# Patient Record
Sex: Female | Born: 1935 | Race: White | Hispanic: No | Marital: Married | Smoking: Never smoker
Health system: Southern US, Community
[De-identification: ages and names within clinical notes are randomized; demographics above are authoritative.]

## PROBLEM LIST (undated history)

## (undated) DIAGNOSIS — K219 Gastro-esophageal reflux disease without esophagitis: Secondary | ICD-10-CM

## (undated) DIAGNOSIS — Z9889 Other specified postprocedural states: Secondary | ICD-10-CM

## (undated) DIAGNOSIS — I712 Thoracic aortic aneurysm, without rupture, unspecified: Secondary | ICD-10-CM

## (undated) DIAGNOSIS — T4145XA Adverse effect of unspecified anesthetic, initial encounter: Secondary | ICD-10-CM

## (undated) DIAGNOSIS — K2970 Gastritis, unspecified, without bleeding: Secondary | ICD-10-CM

## (undated) DIAGNOSIS — N3020 Other chronic cystitis without hematuria: Secondary | ICD-10-CM

## (undated) DIAGNOSIS — D131 Benign neoplasm of stomach: Secondary | ICD-10-CM

## (undated) DIAGNOSIS — K649 Unspecified hemorrhoids: Secondary | ICD-10-CM

## (undated) DIAGNOSIS — I059 Rheumatic mitral valve disease, unspecified: Secondary | ICD-10-CM

## (undated) DIAGNOSIS — C801 Malignant (primary) neoplasm, unspecified: Secondary | ICD-10-CM

## (undated) DIAGNOSIS — H3530 Unspecified macular degeneration: Secondary | ICD-10-CM

## (undated) DIAGNOSIS — K449 Diaphragmatic hernia without obstruction or gangrene: Secondary | ICD-10-CM

## (undated) DIAGNOSIS — Z860100 Personal history of colon polyps, unspecified: Secondary | ICD-10-CM

## (undated) DIAGNOSIS — J40 Bronchitis, not specified as acute or chronic: Secondary | ICD-10-CM

## (undated) DIAGNOSIS — K5790 Diverticulosis of intestine, part unspecified, without perforation or abscess without bleeding: Secondary | ICD-10-CM

## (undated) DIAGNOSIS — E785 Hyperlipidemia, unspecified: Secondary | ICD-10-CM

## (undated) DIAGNOSIS — D329 Benign neoplasm of meninges, unspecified: Secondary | ICD-10-CM

## (undated) DIAGNOSIS — N200 Calculus of kidney: Secondary | ICD-10-CM

## (undated) DIAGNOSIS — I10 Essential (primary) hypertension: Secondary | ICD-10-CM

## (undated) DIAGNOSIS — M1990 Unspecified osteoarthritis, unspecified site: Secondary | ICD-10-CM

## (undated) DIAGNOSIS — T8859XA Other complications of anesthesia, initial encounter: Secondary | ICD-10-CM

## (undated) DIAGNOSIS — R112 Nausea with vomiting, unspecified: Secondary | ICD-10-CM

## (undated) DIAGNOSIS — M810 Age-related osteoporosis without current pathological fracture: Secondary | ICD-10-CM

## (undated) DIAGNOSIS — F419 Anxiety disorder, unspecified: Secondary | ICD-10-CM

## (undated) DIAGNOSIS — D214 Benign neoplasm of connective and other soft tissue of abdomen: Secondary | ICD-10-CM

## (undated) HISTORY — PX: ABDOMINAL HYSTERECTOMY: SHX81

## (undated) HISTORY — DX: Diaphragmatic hernia without obstruction or gangrene: K44.9

## (undated) HISTORY — DX: Benign neoplasm of stomach: D13.1

## (undated) HISTORY — DX: Calculus of kidney: N20.0

## (undated) HISTORY — DX: Unspecified hemorrhoids: K64.9

## (undated) HISTORY — DX: Gastritis, unspecified, without bleeding: K29.70

## (undated) HISTORY — DX: Hyperlipidemia, unspecified: E78.5

## (undated) HISTORY — PX: APPENDECTOMY: SHX54

## (undated) HISTORY — DX: Essential (primary) hypertension: I10

## (undated) HISTORY — PX: BREAST SURGERY: SHX581

## (undated) HISTORY — DX: Gastro-esophageal reflux disease without esophagitis: K21.9

## (undated) HISTORY — DX: Personal history of colon polyps, unspecified: Z86.0100

## (undated) HISTORY — DX: Thoracic aortic aneurysm, without rupture: I71.2

## (undated) HISTORY — DX: Bronchitis, not specified as acute or chronic: J40

## (undated) HISTORY — PX: TONSILLECTOMY: SUR1361

## (undated) HISTORY — DX: Rheumatic mitral valve disease, unspecified: I05.9

## (undated) HISTORY — DX: Benign neoplasm of meninges, unspecified: D32.9

## (undated) HISTORY — PX: LEG SKIN LESION  BIOPSY / EXCISION: SUR473

## (undated) HISTORY — DX: Diverticulosis of intestine, part unspecified, without perforation or abscess without bleeding: K57.90

## (undated) HISTORY — DX: Benign neoplasm of connective and other soft tissue of abdomen: D21.4

## (undated) HISTORY — DX: Thoracic aortic aneurysm, without rupture, unspecified: I71.20

## (undated) HISTORY — PX: COLONOSCOPY: SHX5424

## (undated) HISTORY — DX: Unspecified macular degeneration: H35.30

## (undated) HISTORY — DX: Other chronic cystitis without hematuria: N30.20

## (undated) HISTORY — DX: Age-related osteoporosis without current pathological fracture: M81.0

## (undated) HISTORY — DX: Unspecified osteoarthritis, unspecified site: M19.90

## (undated) HISTORY — PX: ESOPHAGOGASTRODUODENOSCOPY: SHX1529

## (undated) HISTORY — PX: KNEE SURGERY: SHX244

## (undated) HISTORY — PX: CHOLECYSTECTOMY: SHX55

## (undated) HISTORY — PX: SURGERY OF LIP: SUR1315

## (undated) HISTORY — DX: Personal history of colonic polyps: Z86.010

---

## 1993-09-06 HISTORY — PX: CARDIAC CATHETERIZATION: SHX172

## 1998-09-06 HISTORY — PX: OTHER SURGICAL HISTORY: SHX169

## 2003-04-09 ENCOUNTER — Encounter: Admission: RE | Admit: 2003-04-09 | Discharge: 2003-04-09 | Payer: Self-pay | Admitting: Internal Medicine

## 2003-04-09 ENCOUNTER — Encounter: Payer: Self-pay | Admitting: Internal Medicine

## 2004-02-05 ENCOUNTER — Encounter: Payer: Self-pay | Admitting: Internal Medicine

## 2004-02-10 ENCOUNTER — Encounter: Payer: Self-pay | Admitting: Cardiology

## 2004-07-01 ENCOUNTER — Ambulatory Visit (HOSPITAL_COMMUNITY): Admission: RE | Admit: 2004-07-01 | Discharge: 2004-07-01 | Payer: Self-pay | Admitting: Internal Medicine

## 2005-06-22 ENCOUNTER — Encounter: Payer: Self-pay | Admitting: Cardiology

## 2005-07-06 ENCOUNTER — Ambulatory Visit (HOSPITAL_COMMUNITY): Admission: RE | Admit: 2005-07-06 | Discharge: 2005-07-06 | Payer: Self-pay | Admitting: Internal Medicine

## 2005-11-29 ENCOUNTER — Ambulatory Visit: Payer: Self-pay | Admitting: Internal Medicine

## 2006-07-12 ENCOUNTER — Ambulatory Visit (HOSPITAL_COMMUNITY): Admission: RE | Admit: 2006-07-12 | Discharge: 2006-07-12 | Payer: Self-pay | Admitting: Internal Medicine

## 2006-07-15 ENCOUNTER — Encounter: Admission: RE | Admit: 2006-07-15 | Discharge: 2006-07-15 | Payer: Self-pay | Admitting: Internal Medicine

## 2007-04-04 ENCOUNTER — Ambulatory Visit: Payer: Self-pay | Admitting: Pulmonary Disease

## 2007-04-04 ENCOUNTER — Ambulatory Visit (HOSPITAL_COMMUNITY): Admission: RE | Admit: 2007-04-04 | Discharge: 2007-04-04 | Payer: Self-pay | Admitting: Pulmonary Disease

## 2007-05-04 ENCOUNTER — Ambulatory Visit: Payer: Self-pay | Admitting: Cardiology

## 2007-07-18 ENCOUNTER — Ambulatory Visit (HOSPITAL_COMMUNITY): Admission: RE | Admit: 2007-07-18 | Discharge: 2007-07-18 | Payer: Self-pay | Admitting: Internal Medicine

## 2007-07-21 ENCOUNTER — Ambulatory Visit: Payer: Self-pay | Admitting: Internal Medicine

## 2007-07-26 ENCOUNTER — Ambulatory Visit (HOSPITAL_COMMUNITY): Admission: RE | Admit: 2007-07-26 | Discharge: 2007-07-26 | Payer: Self-pay | Admitting: Internal Medicine

## 2007-08-08 ENCOUNTER — Ambulatory Visit (HOSPITAL_COMMUNITY): Admission: RE | Admit: 2007-08-08 | Discharge: 2007-08-08 | Payer: Self-pay | Admitting: Internal Medicine

## 2007-09-20 ENCOUNTER — Ambulatory Visit: Payer: Self-pay | Admitting: Internal Medicine

## 2007-09-29 ENCOUNTER — Ambulatory Visit: Payer: Self-pay | Admitting: Internal Medicine

## 2007-10-20 DIAGNOSIS — D320 Benign neoplasm of cerebral meninges: Secondary | ICD-10-CM | POA: Insufficient documentation

## 2007-10-20 DIAGNOSIS — Z8679 Personal history of other diseases of the circulatory system: Secondary | ICD-10-CM | POA: Insufficient documentation

## 2007-10-20 DIAGNOSIS — M1990 Unspecified osteoarthritis, unspecified site: Secondary | ICD-10-CM | POA: Insufficient documentation

## 2007-10-20 DIAGNOSIS — J45909 Unspecified asthma, uncomplicated: Secondary | ICD-10-CM

## 2007-10-20 DIAGNOSIS — J309 Allergic rhinitis, unspecified: Secondary | ICD-10-CM | POA: Insufficient documentation

## 2007-10-20 DIAGNOSIS — N200 Calculus of kidney: Secondary | ICD-10-CM | POA: Insufficient documentation

## 2008-04-26 ENCOUNTER — Encounter: Payer: Self-pay | Admitting: Internal Medicine

## 2008-07-19 ENCOUNTER — Ambulatory Visit (HOSPITAL_COMMUNITY): Admission: RE | Admit: 2008-07-19 | Discharge: 2008-07-19 | Payer: Self-pay | Admitting: Family Medicine

## 2009-01-01 ENCOUNTER — Encounter: Admission: RE | Admit: 2009-01-01 | Discharge: 2009-01-01 | Payer: Self-pay | Admitting: Family Medicine

## 2009-01-15 ENCOUNTER — Encounter (INDEPENDENT_AMBULATORY_CARE_PROVIDER_SITE_OTHER): Payer: Self-pay | Admitting: *Deleted

## 2009-01-17 ENCOUNTER — Ambulatory Visit: Payer: Self-pay | Admitting: Internal Medicine

## 2009-01-17 ENCOUNTER — Inpatient Hospital Stay (HOSPITAL_COMMUNITY): Admission: EM | Admit: 2009-01-17 | Discharge: 2009-01-19 | Payer: Self-pay | Admitting: Emergency Medicine

## 2009-01-18 ENCOUNTER — Encounter (INDEPENDENT_AMBULATORY_CARE_PROVIDER_SITE_OTHER): Payer: Self-pay | Admitting: Internal Medicine

## 2009-02-12 ENCOUNTER — Ambulatory Visit: Payer: Self-pay | Admitting: Internal Medicine

## 2009-02-12 DIAGNOSIS — Z860100 Personal history of colon polyps, unspecified: Secondary | ICD-10-CM | POA: Insufficient documentation

## 2009-02-20 ENCOUNTER — Ambulatory Visit: Payer: Self-pay | Admitting: Internal Medicine

## 2009-03-20 ENCOUNTER — Ambulatory Visit: Payer: Self-pay | Admitting: Internal Medicine

## 2009-04-29 ENCOUNTER — Ambulatory Visit: Payer: Self-pay | Admitting: Internal Medicine

## 2009-05-13 ENCOUNTER — Ambulatory Visit: Payer: Self-pay | Admitting: Internal Medicine

## 2009-06-10 ENCOUNTER — Encounter: Payer: Self-pay | Admitting: Internal Medicine

## 2009-06-10 ENCOUNTER — Encounter: Payer: Self-pay | Admitting: Adult Health

## 2009-06-10 LAB — CONVERTED CEMR LAB
ALT: 20 units/L
AST: 24 units/L
Albumin: 4.4 g/dL
Alkaline Phosphatase: 97 units/L
BUN: 17 mg/dL
CO2: 25 meq/L
Calcium: 10 mg/dL
Chloride: 105 meq/L
Cholesterol: 198 mg/dL
Creatinine, Ser: 0.63 mg/dL
Glucose, Bld: 101 mg/dL
HDL: 89 mg/dL
Hemoglobin: 14.3 g/dL
LDL Cholesterol: 89 mg/dL
Platelets: 182 10*3/uL
Potassium: 4.5 meq/L
Sodium: 139 meq/L
TSH: 2.87 microintl units/mL
Total Bilirubin: 0.7 mg/dL
Total Protein: 6.5 g/dL
Triglyceride fasting, serum: 98 mg/dL
WBC: 7.4 10*3/uL

## 2009-06-13 ENCOUNTER — Encounter: Payer: Self-pay | Admitting: Adult Health

## 2009-06-13 ENCOUNTER — Encounter: Admission: RE | Admit: 2009-06-13 | Discharge: 2009-06-13 | Payer: Self-pay | Admitting: Family Medicine

## 2009-07-11 ENCOUNTER — Telehealth: Payer: Self-pay | Admitting: Internal Medicine

## 2009-07-15 ENCOUNTER — Ambulatory Visit: Payer: Self-pay | Admitting: Internal Medicine

## 2009-07-15 DIAGNOSIS — M810 Age-related osteoporosis without current pathological fracture: Secondary | ICD-10-CM | POA: Insufficient documentation

## 2009-07-15 LAB — CONVERTED CEMR LAB
BUN: 19 mg/dL (ref 6–23)
CO2: 30 meq/L (ref 19–32)
Calcium: 9.5 mg/dL (ref 8.4–10.5)
Chloride: 102 meq/L (ref 96–112)
Creatinine, Ser: 0.5 mg/dL (ref 0.4–1.2)
GFR calc non Af Amer: 126.04 mL/min (ref >60)
Glucose, Bld: 89 mg/dL (ref 70–99)
Potassium: 3.9 meq/L (ref 3.5–5.1)
Sodium: 140 meq/L (ref 135–145)

## 2009-07-22 ENCOUNTER — Encounter: Payer: Self-pay | Admitting: Internal Medicine

## 2009-07-25 ENCOUNTER — Ambulatory Visit (HOSPITAL_COMMUNITY): Admission: RE | Admit: 2009-07-25 | Discharge: 2009-07-25 | Payer: Self-pay | Admitting: Family Medicine

## 2009-07-28 ENCOUNTER — Ambulatory Visit (HOSPITAL_COMMUNITY): Admission: RE | Admit: 2009-07-28 | Discharge: 2009-07-28 | Payer: Self-pay | Admitting: Internal Medicine

## 2009-07-28 ENCOUNTER — Encounter: Payer: Self-pay | Admitting: Internal Medicine

## 2009-08-18 ENCOUNTER — Ambulatory Visit: Payer: Self-pay | Admitting: Internal Medicine

## 2009-09-24 ENCOUNTER — Ambulatory Visit: Payer: Self-pay | Admitting: Internal Medicine

## 2009-09-24 DIAGNOSIS — E785 Hyperlipidemia, unspecified: Secondary | ICD-10-CM | POA: Insufficient documentation

## 2009-09-24 DIAGNOSIS — I10 Essential (primary) hypertension: Secondary | ICD-10-CM | POA: Insufficient documentation

## 2009-09-24 DIAGNOSIS — M159 Polyosteoarthritis, unspecified: Secondary | ICD-10-CM

## 2009-09-24 DIAGNOSIS — Z87442 Personal history of urinary calculi: Secondary | ICD-10-CM

## 2009-09-26 ENCOUNTER — Telehealth: Payer: Self-pay | Admitting: Internal Medicine

## 2009-09-29 ENCOUNTER — Encounter (INDEPENDENT_AMBULATORY_CARE_PROVIDER_SITE_OTHER): Payer: Self-pay | Admitting: *Deleted

## 2009-11-17 ENCOUNTER — Ambulatory Visit: Payer: Self-pay | Admitting: Cardiology

## 2009-11-17 DIAGNOSIS — I059 Rheumatic mitral valve disease, unspecified: Secondary | ICD-10-CM

## 2009-11-18 ENCOUNTER — Telehealth: Payer: Self-pay | Admitting: Cardiology

## 2009-12-12 ENCOUNTER — Encounter: Payer: Self-pay | Admitting: Cardiology

## 2009-12-12 ENCOUNTER — Ambulatory Visit: Payer: Self-pay

## 2009-12-12 ENCOUNTER — Ambulatory Visit (HOSPITAL_COMMUNITY): Admission: RE | Admit: 2009-12-12 | Discharge: 2009-12-12 | Payer: Self-pay | Admitting: Cardiology

## 2009-12-12 ENCOUNTER — Ambulatory Visit: Payer: Self-pay | Admitting: Internal Medicine

## 2010-01-23 ENCOUNTER — Ambulatory Visit: Payer: Self-pay | Admitting: Internal Medicine

## 2010-01-23 DIAGNOSIS — L03119 Cellulitis of unspecified part of limb: Secondary | ICD-10-CM

## 2010-01-23 DIAGNOSIS — L02419 Cutaneous abscess of limb, unspecified: Secondary | ICD-10-CM

## 2010-04-16 ENCOUNTER — Telehealth: Payer: Self-pay | Admitting: Internal Medicine

## 2010-06-05 ENCOUNTER — Ambulatory Visit: Payer: Self-pay | Admitting: Internal Medicine

## 2010-06-05 DIAGNOSIS — R5381 Other malaise: Secondary | ICD-10-CM

## 2010-06-05 DIAGNOSIS — R5383 Other fatigue: Secondary | ICD-10-CM

## 2010-06-05 LAB — CONVERTED CEMR LAB
ALT: 17 units/L (ref 0–35)
AST: 21 units/L (ref 0–37)
Albumin: 4 g/dL (ref 3.5–5.2)
Alkaline Phosphatase: 106 units/L (ref 39–117)
Basophils Absolute: 0 10*3/uL (ref 0.0–0.1)
Basophils Relative: 0.2 % (ref 0.0–3.0)
Bilirubin, Direct: 0.2 mg/dL (ref 0.0–0.3)
Cholesterol: 189 mg/dL (ref 0–200)
Eosinophils Absolute: 0.4 10*3/uL (ref 0.0–0.7)
Eosinophils Relative: 4.9 % (ref 0.0–5.0)
HCT: 41 % (ref 36.0–46.0)
HDL: 86.9 mg/dL (ref >39.00)
Hemoglobin: 14.1 g/dL (ref 12.0–15.0)
LDL Cholesterol: 90 mg/dL (ref 0–99)
Lymphocytes Relative: 18 % (ref 12.0–46.0)
Lymphs Abs: 1.3 10*3/uL (ref 0.7–4.0)
MCHC: 34.4 g/dL (ref 30.0–36.0)
MCV: 92.4 fL (ref 78.0–100.0)
Monocytes Absolute: 0.7 10*3/uL (ref 0.1–1.0)
Monocytes Relative: 9.2 % (ref 3.0–12.0)
Neutro Abs: 5.1 10*3/uL (ref 1.4–7.7)
Neutrophils Relative %: 67.7 % (ref 43.0–77.0)
Platelets: 197 10*3/uL (ref 150.0–400.0)
RBC: 4.43 M/uL (ref 3.87–5.11)
RDW: 13.1 % (ref 11.5–14.6)
TSH: 2.47 microintl units/mL (ref 0.35–5.50)
Total Bilirubin: 0.8 mg/dL (ref 0.3–1.2)
Total CHOL/HDL Ratio: 2
Total Protein: 6.6 g/dL (ref 6.0–8.3)
Triglycerides: 60 mg/dL (ref 0.0–149.0)
VLDL: 12 mg/dL (ref 0.0–40.0)
WBC: 7.5 10*3/uL (ref 4.5–10.5)

## 2010-07-28 ENCOUNTER — Ambulatory Visit (HOSPITAL_COMMUNITY)
Admission: RE | Admit: 2010-07-28 | Discharge: 2010-07-28 | Payer: Self-pay | Source: Home / Self Care | Admitting: Internal Medicine

## 2010-10-06 NOTE — Progress Notes (Signed)
Summary: refill question  Medications Added LOSARTAN POTASSIUM 50 MG TABS (LOSARTAN POTASSIUM) 1 by mouth daily       Phone Note From Pharmacy Call back at [phone]   [facility]er: [facility] Summary of Call: pt states Lorsartan is suppose to be 50mg  not 25mg , please call pharmacy to verifiy.... also wants 90 day supply Initial call taken by: Migdalia Dk,  November 18, 2009 9:14 AM  Follow-up for Phone Call        Rx sent in wrong. Resent RX for losartan 50mg  1 by mouth daily. Pt states her b/p is high today but she has not taken her medication this am because she has been out. Told pt she needs to take losartan today and if she continues to have hypertension, she needs to call us back. Will notifiy Evalee Jefferson, CNA  November 18, 2009 10:59 AM  Follow-up by: Marrion Coy, CNA,  November 18, 2009 10:59 AM    New/Updated Medications: LOSARTAN POTASSIUM 50 MG TABS (LOSARTAN POTASSIUM) 1 by mouth daily Prescriptions: LOSARTAN POTASSIUM 50 MG TABS (LOSARTAN POTASSIUM) 1 by mouth daily  #90 x 3   Entered by:   Marrion Coy, CNA   Authorized by:   Rollene Rotunda, MD, [facility]   Signed by:   Marrion Coy, CNA on 11/18/2009   Method used:   [facility]        [facility]  [phone]* (retail)       [address] [place], [place]  [address]       Ph: [phone] or [phone]       Fax: [phone]   RxID:   [id number]   Appended Document: refill question Received and Reviewed

## 2010-10-06 NOTE — Progress Notes (Signed)
Summary: Referral  Phone Note Call from Patient Call back at Home Phone [phone]   Caller: Patient Summary of Call: pt called requesting referral to Cardio. Dr. Rollene Rotunda. pt want appt in Feb but not on a  Mon or Fri. Initial call taken by: Margaret Pyle, CMA,  September 26, 2009 10:52 AM  Follow-up for Phone Call        will make cards referral to [facility] as requested but i can not promise apt will be in feb - - will request to avoid mon and fri - Our office will contact her regarding this appointment once made.  thanks Follow-up by: Newt Lukes MD,  September 26, 2009 12:35 PM  Additional Follow-up for Phone Call Additional follow up Details #1::        pt informed via VM. told to call back with nay further questions or concerns Additional Follow-up by: Margaret Pyle, CMA,  September 26, 2009 1:35 PM

## 2010-10-06 NOTE — Progress Notes (Signed)
Summary: elevated BP  Phone Note Call from Patient Call back at Home Phone [phone]   Caller: Patient Summary of Call: Pt called to inform MD that at her last few OV her BO has been elevated 200/90 the highest. Pt states that she has "white-coat syndrome" and when she checks her BP at home or at her local pharmacy is has been stable at 140/80-70. Pt states that the Urologist that she saw todaywas concerned about her BP and wanted her to inform VAL. Pt has been keeping a BP diary and does notice a pattern of elevated readings at her OV only. Pt has appt in Oct and will bring diary then for MD review. Does pt need to make an earlier appt or okay to monitor and call back as needed? Her BP has since decreased to 139/82 since leaving MDs office. Initial call taken by: Margaret Pyle, CMA,  April 16, 2010 10:18 AM  Follow-up for Phone Call        may keep OV in Oct - sooner if home SBP>140 - thanks Follow-up by: Newt Lukes MD,  April 16, 2010 10:40 AM  Additional Follow-up for Phone Call Additional follow up Details #1::        Pt informed and will monitor home reading  Additional Follow-up by: Margaret Pyle, CMA,  April 16, 2010 11:08 AM

## 2010-10-06 NOTE — Letter (Signed)
Summary: [facility] Consult Scheduled Letter  [facility]  [address] [place], [place] [address]   Phone: [phone]  Fax: [phone]      09/29/2009 MRN: [id number]  SOLEIL MAS [address] [place], [place]  [address]    Dear Ms. Reels,      We have scheduled an appointment for you.  At the recommendation of Dr.leschber, we have scheduled you a consult with Dr.Hochrein on Febuary 24,2011 at 12:00pm.  Their phone number is [phone].If this appointment day and time is not convenient for you, please feel free to call the office of the doctor you are being referred to at the number listed above and reschedule the appointment.  [address] [place] [address]   Thank you,  Patient Care Coordinator [facility]

## 2010-10-06 NOTE — Assessment & Plan Note (Signed)
Summary: NEW/ MEDICARE / BCBS /NWS #   Vital Signs:  Patient profile:   77 year old female Height:      63 inches (160.02 cm) Weight:      134.6 pounds (61.18 kg) O2 Sat:      95 % on Room air Temp:     98.2 degrees F (36.78 degrees C) oral Pulse rate:   80 / minute BP sitting:   134 / 82  (left arm) Cuff size:   regular  Vitals Entered By: Orlan Leavens (September 24, 2009 10:23 AM)  O2 Flow:  Room air CC: New patient Is Patient Diabetic? No Pain Assessment Patient in pain? no        Primary Care Provider:  Newt Lukes MD  CC:  New patient.  History of Present Illness: new pt to me and our PC division - here to est care prev PCP dr. Duanne Guess  1) asthma/reactiv airway dz - follows with our pulm for same - Wert reports compliance with ongoing medical treatment and no changes in medication dose or frequency. denies adverse side effects related to current therapy. no change in breathing with winter air  2) dyslipidemia -  reports compliance with ongoing medical treatment and no changes in medication dose or frequency. denies adverse side effects related to current therapy. no GI or muscle problems reported  3) HTN - reports compliance with ongoing medical treatment and no changes in medication dose or frequency. denies adverse side effects related to current therapy.  variable readings at home with SBP range 80-150s - when changed to generic ARB from Diovan, reports she "lost the diurtetic" and would like it back due to mild occ pedal edema  4) osteoporosis - reports compliance with ongoing medical treatment and no changes in medication dose or frequency. denies adverse side effects related to current therapy. reclast annually as intol to oral bisphos - initiated this Nov 2010 - hx rib fx and pelvic fx  Preventive Screening-Counseling & Management  Alcohol-Tobacco     Smoking Status: never  Safety-Violence-Falls     Fall Risk Counseling: not indicated; no  significant falls noted  Clinical Review Panels:  Prevention   Last Colonoscopy:  Location:  [facility].  Results: Hemorrhoids.     Results: Diverticulosis.        (02/05/2004)  Immunizations   Last Tetanus Booster:  Historical (09/07/2007)   Last Flu Vaccine:  Historical (06/06/2009)   Last Pneumovax:  Historical (09/07/2007)  Lipid Management   Cholesterol:  198 (06/10/2009)   LDL (bad choesterol):  89 (06/10/2009)   HDL (good cholesterol):  89 (06/10/2009)   Triglycerides:  98 (06/10/2009)  CBC   WBC:  7.4 (06/10/2009)   Hgb:  14.3 (06/10/2009)   Platelets:  182 (06/10/2009)  Complete Metabolic Panel   Glucose:  89 (07/15/2009)   Sodium:  140 (07/15/2009)   Potassium:  3.9 (07/15/2009)   Chloride:  102 (07/15/2009)   CO2:  30 (07/15/2009)   BUN:  19 (07/15/2009)   Creatinine:  0.5 (07/15/2009)   Albumin:  4.4 (06/10/2009)   Total Protein:  6.5 (06/10/2009)   Calcium:  9.5 (07/15/2009)   Total Bili:  0.7 (06/10/2009)   Alk Phos:  97 (06/10/2009)   SGPT (ALT):  20 (06/10/2009)   SGOT (AST):  24 (06/10/2009)   -  Date:  06/10/2009    WBC: 7.4    HGB: 14.3    PLT: 182    SGOT (AST): 24  SGPT (ALT): 20    T. Bilirubin: 0.7    Alk Phos: 97    Total Protein: 6.5    Albumin: 4.4    Cholesterol: 198    LDL: 89    HDL: 89    Triglycerides: 98    TSH: 2.870  Current Medications (verified): 1)  Aspirin 81 Mg Tbec (Aspirin) .... Once Daily 2)  Losartan Potassium 50 Mg Tabs (Losartan Potassium) .... Take 1 Tablet By Mouth Once A Day 3)  Nexium 40 Mg Cpdr (Esomeprazole Magnesium) .... Take One 30-60 Min Before First and Last Meals of The Day 4)  Simvastatin 5 Mg Tabs (Simvastatin) .... At Bedtime 5)  Calcium-Vitamin D 500-200 Mg-Unit Tabs (Calcium Carbonate-Vitamin D) .... Take 1 Tablet By Mouth Two Times A Day 6)  Vitamin D3 2000 Unit Caps (Cholecalciferol) .... Take 1 Capsule By Mouth Once A Day 7)  Centrum Silver  Tabs (Multiple  Vitamins-Minerals) .... Take 1 Tablet By Mouth Once A Day 8)  Fluticasone Propionate 50 Mcg/act Susp (Fluticasone Propionate) .Marland Kitchen.. 1-2 Sprays Each Nostril Once Daily 9)  Chlor-Trimeton 4 Mg Tabs (Chlorpheniramine Maleate) .Marland Kitchen.. 1 Every 4 Hours As Needed 10)  Delsym 30 Mg/36ml Lqcr (Dextromethorphan Polistirex) .... 2 Teaspoons Every 12 Hours As Needed 11)  Tessalon 200 Mg Caps (Benzonatate) .... Add 1 Capsule Every 8 Hours As Needed 12)  Nitrofurantoin Macrocrystal 100 Mg Caps (Nitrofurantoin Macrocrystal) .... Take 1 Capsule By Mouth Once A Day 13)  Reclast 5 Mg/1102ml Soln (Zoledronic Acid) .... Infusion Yearly  Allergies (verified): 1)  ! Percocet 2)  ! Codeine 3)  ! * Sodium Penathol 4)  ! Levaquin 5)  ! Sulfa 6)  ! Sulfa 7)  ! Adhesive Tape  Past History:  Past Medical History: hypertension HEART MURMUR, HX OF  RHINOSINUSITIS, ALLERGIC      - [facility] eval 2009 ok  NEPHROLITHIASIS, hx of MENINGIOMA OSTEOARTHRITIS  COUGH onset around 50...........................................................Marland KitchenWert    - EGD c/w es dysmotility 09/29/07    - Gastric emptying study nl 07/26/07    - CT Chest nl 05/04/07     -Initial consult dictated Jan 19, 2009 try off biphosphonate/fish oil > rx ppi two times a day and zantac at hs     - February 20, 2009 add reglan 10 mg ac and hs x 1 month trial Osteoporosis --BMD -3.0 (06/13/09)- (intolerant to bisphosphanate) Reclast rx 07/28/2009 --vitamin d 66 (10/10)  MD rooster: pulm - wert GI- [facility] ent - [facility] - [facility] cards - [facility], but changing to [facility]  Past Surgical History: Cholecystectomy hysterectomy breast surgery Appendectomy knee surgery cataract surgery (2000) Lip surgery, cx (2002)  Family History: Reviewed history from 02/12/2009 and no changes required. Family History of Breast Cancer:Sister Family History of Heart Disease: Mother, Father & Siblings  Social History: Reviewed history from 02/12/2009 and no changes  required. Occupation:Retired Patient has never smoked.  Alcohol Use - yes-wine Daily Caffeine Use Illicit Drug Use - no married, lives with spouse enjoys golf  Review of Systems       see HPI above. I have reviewed all other systems and they were negative.   Physical Exam  General:  alert, well-developed, well-nourished, and cooperative to examination.    Eyes:  vision grossly intact; pupils equal, round and reactive to light.  conjunctiva and lids normal.    Ears:  normal pinnae bilaterally, without erythema, swelling, or tenderness to palpation. TMs clear, without effusion, or cerumen impaction. Hearing grossly normal bilaterally  Mouth:  teeth and gums in good repair; mucous membranes moist, without lesions or ulcers. oropharynx clear without exudate, no erythema.  Lungs:  normal respiratory effort, no intercostal retractions or use of accessory muscles; normal breath sounds bilaterally - no crackles and no wheezes.    Heart:  normal rate, regular rhythm, no murmur, and no rub. BLE without edema. Abdomen:  soft, non-tender, normal bowel sounds, no distention; no masses and no appreciable hepatomegaly or splenomegaly.   Msk:  No deformity or scoliosis noted of thoracic or lumbar spine.   Neurologic:  alert & oriented X3 and cranial nerves II-XII symetrically intact.  strength normal in all extremities, sensation intact to light touch, and gait normal. speech fluent without dysarthria or aphasia; follows commands with good comprehension.  Skin:  no rashes, vesicles, ulcers, or erythema. No nodules or irregularity to palpation.  Psych:  Oriented X3, memory intact for recent and remote, normally interactive, good eye contact, not anxious appearing, not depressed appearing, and not agitated.      Impression & Recommendations:  Problem # 1:  HYPERTENSION (ICD-401.9)  add HCT into ARB for better control - home BP log reviewed Her updated medication list for this problem includes:     Losartan Potassium-hctz 50-12.5 Mg Tabs (Losartan potassium-hctz) .Marland Kitchen... 1 by mouth once daily  BP today: 134/82 Prior BP: 160/90 (08/18/2009)  Labs Reviewed: K+: 3.9 (07/15/2009) Creat: : 0.5 (07/15/2009)   Chol: 198 (06/10/2009)   HDL: 89 (06/10/2009)   LDL: 89 (06/10/2009)   TG: 98 (06/10/2009)  Orders: Prescription Created Electronically [phone])  Problem # 2:  DYSLIPIDEMIA (ICD-272.4) recent FLP and LFTs ok - no adv SE - cont same Her updated medication list for this problem includes:    Simvastatin 5 Mg Tabs (Simvastatin) .Marland Kitchen... At bedtime  Labs Reviewed: SGOT: 24 (06/10/2009)   SGPT: 20 (06/10/2009)   HDL:89 (06/10/2009)  LDL:89 (06/10/2009)  Chol:198 (06/10/2009)  Trig:98 (06/10/2009)  Problem # 3:  OSTEOPOROSIS (ICD-733.00) cont tx and daily Ca+D as ongoing Her updated medication list for this problem includes:    Reclast 5 Mg/157ml Soln (Zoledronic acid) ..... Infusion yearly  Discussed medication use, applications of heat or ice, and exercises.   Problem # 4:  REACTIVE AIRWAY DISEASE (ICD-493.90) chronic cough, intol to MDIs - mgmt as ongoing with pulm  Problem # 5:  HEART MURMUR, HX OF (ICD-V12.50) reports she is planning change to [facility] cards for "new opinion" about the health of her heart - told 2 years ago that her "heart muscles were giving out" but has no symptoms SOB, syncope or edema -  ?last echo - will send for records and refer to new cards (after pt has chosen whom she would like to see - plans to review web site data and d/w her dtr who is Charity fundraiser at [facility] on tele floor - [name])  Problem # 6:  GENERALIZED OSTEOARTHROSIS UNSPECIFIED SITE (ICD-715.00) follows with [facility] [facility] for same as needed -   Time spent with patient 45 minutes, more than 50% of this time was spent reviewing pt health hx, recent labs from former provider and her medications as well as counseling patient on med mgmt of her chronic medical conditions.  Her updated medication list for this problem  includes:    Aspirin 81 Mg Tbec (Aspirin) ..... Once daily  Complete Medication List: 1)  Aspirin 81 Mg Tbec (Aspirin) .... Once daily 2)  Nexium 40 Mg Cpdr (Esomeprazole magnesium) .... Take one 30-60 min before first and last meals  of the day 3)  Simvastatin 5 Mg Tabs (Simvastatin) .... At bedtime 4)  Calcium-vitamin D 500-200 Mg-unit Tabs (Calcium carbonate-vitamin d) .... Take 1 tablet by mouth two times a day 5)  Vitamin D3 2000 Unit Caps (Cholecalciferol) .... Take 1 capsule by mouth once a day 6)  Centrum Silver Tabs (Multiple vitamins-minerals) .... Take 1 tablet by mouth once a day 7)  Fluticasone Propionate 50 Mcg/act Susp (Fluticasone propionate) .Marland Kitchen.. 1-2 sprays each nostril once daily 8)  Chlor-trimeton 4 Mg Tabs (Chlorpheniramine maleate) .Marland Kitchen.. 1 every 4 hours as needed 9)  Delsym 30 Mg/32ml Lqcr (Dextromethorphan polistirex) .... 2 teaspoons every 12 hours as needed 10)  Tessalon 200 Mg Caps (Benzonatate) .... Add 1 capsule every 8 hours as needed 11)  Nitrofurantoin Macrocrystal 100 Mg Caps (Nitrofurantoin macrocrystal) .... Take 1 capsule by mouth once a day 12)  Reclast 5 Mg/154ml Soln (Zoledronic acid) .... Infusion yearly 13)  Losartan Potassium-hctz 50-12.5 Mg Tabs (Losartan potassium-hctz) .Marland Kitchen.. 1 by mouth once daily  Patient Instructions: 1)  it was good to see you today.  2)  change to your blood pressure medication as discussed to include diuretic - your prescription has been electronically submitted to your pharmacy. Please take as directed. Contact our office if you believe you're having problems with the medication(s).  3)  labs from last visit reviewed - looks great! 4)  will send for records from your prior cardiologist to review 5)  Please schedule a follow-up appointment in 4 months, sooner if problems.  6)  Let us know which [facility] cardiology doctor you would like to see and we will make a referral for you Prescriptions: LOSARTAN POTASSIUM-HCTZ 50-12.5 MG TABS  (LOSARTAN POTASSIUM-HCTZ) 1 by mouth once daily  #30 x 11   Entered and Authorized by:   Newt Lukes MD   Signed by:   Newt Lukes MD on 09/24/2009   Method used:   [facility]        [facility]  [phone]* (retail)       [address] [place], [place]  [address]       Ph: [phone] or [phone]       Fax: [phone]   RxID:   [id number]    Immunization History:  Tetanus/Td Immunization History:    Tetanus/Td:  historical (09/07/2007)  Pneumovax Immunization History:    Pneumovax:  historical (09/07/2007)    Colonoscopy  Procedure date:  02/05/2004  Findings:      Location:  [facility].  Results: Hemorrhoids.     Results: Diverticulosis.         EGD  Procedure date:  09/29/2007  Findings:      Location: [facility]  results; numerous proximal gastic polyps (known to be fundic gland polyps), otherwise ok  MISC. Report  Procedure date:  02/05/2004  Findings:      Type of Report: stomach biopsy Results: Benign, neoplasia   EGD  Procedure date:  02/05/2004  Findings:      Location: [facility]  Results: Hiatal hernia               Chronic gastritis

## 2010-10-06 NOTE — Assessment & Plan Note (Signed)
Summary: 4 mth fu--stc   Vital Signs:  Patient profile:   77 year old female Height:      63 inches (160.02 cm) Weight:      135.0 pounds (61.36 kg) O2 Sat:      94 % on Room air Temp:     98.0 degrees F (36.67 degrees C) oral Pulse rate:   66 / minute BP sitting:   142 / 82  (left arm) Cuff size:   regular  Vitals Entered By: Orlan Leavens (Jan 23, 2010 10:00 AM)  O2 Flow:  Room air CC: 4 month follow-up Is Patient Diabetic? No Pain Assessment Patient in pain? no        Primary Care Provider:  Newt Lukes MD  CC:  4 month follow-up.  History of Present Illness: here for followup  1) asthma/reactiv airway dz - follows with our pulm for same - Wert reports compliance with ongoing medical treatment and no changes in medication dose or frequency. denies adverse side effects related to current therapy. no change in breathing with season changes  2) dyslipidemia -  reports compliance with ongoing medical treatment and no changes in medication dose or frequency. denies adverse side effects related to current therapy. no GI or muscle problems reported  3) HTN - reports compliance with ongoing medical treatment and no changes in medication dose or frequency. denies adverse side effects related to current therapy.  variable readings at home with SBP range 110-150s - has seen cards for same -   4) osteoporosis - reports compliance with ongoing medical treatment and no changes in medication dose or frequency. denies adverse side effects related to current therapy. reclast annually as intol to oral bisphos - initiated this Nov 2010 - hx rib fx and pelvic fx  concerned about skin wound on right shin - occured 2 weeks ago - accidental injury caused skin tear - no bleeding but inc redness around scab - no fever, no drainage, no swelling, +tender to touch - no difficulty walking - better with use of polysporin oint daily  Clinical Review Panels:  Lipid Management  Cholesterol:  198 (06/10/2009)   LDL (bad choesterol):  89 (06/10/2009)   HDL (good cholesterol):  89 (06/10/2009)   Triglycerides:  98 (06/10/2009)  CBC   WBC:  7.4 (06/10/2009)   Hgb:  14.3 (06/10/2009)   Platelets:  182 (06/10/2009)  Complete Metabolic Panel   Glucose:  89 (07/15/2009)   Sodium:  140 (07/15/2009)   Potassium:  3.9 (07/15/2009)   Chloride:  102 (07/15/2009)   CO2:  30 (07/15/2009)   BUN:  19 (07/15/2009)   Creatinine:  0.5 (07/15/2009)   Albumin:  4.4 (06/10/2009)   Total Protein:  6.5 (06/10/2009)   Calcium:  9.5 (07/15/2009)   Total Bili:  0.7 (06/10/2009)   Alk Phos:  97 (06/10/2009)   SGPT (ALT):  20 (06/10/2009)   SGOT (AST):  24 (06/10/2009)   Current Medications (verified): 1)  Aspirin 81 Mg Tbec (Aspirin) .... Once Daily 2)  Nexium 40 Mg Cpdr (Esomeprazole Magnesium) .... Take One 30-60 Min Before First and Last Meals of The Day 3)  Simvastatin 5 Mg Tabs (Simvastatin) .... At Bedtime 4)  Calcium-Vitamin D 500-200 Mg-Unit Tabs (Calcium Carbonate-Vitamin D) .... Take 1 Tablet By Mouth Two Times A Day 5)  Vitamin D3 2000 Unit Caps (Cholecalciferol) .... Take 1 Capsule By Mouth Once A Day 6)  Centrum Silver  Tabs (Multiple Vitamins-Minerals) .... Take 1 Tablet By Mouth  Once A Day 7)  Fluticasone Propionate 50 Mcg/act Susp (Fluticasone Propionate) .Marland Kitchen.. 1-2 Sprays Each Nostril Once Daily 8)  Chlor-Trimeton 4 Mg Tabs (Chlorpheniramine Maleate) .Marland Kitchen.. 1 Every 4 Hours As Needed 9)  Delsym 30 Mg/23ml Lqcr (Dextromethorphan Polistirex) .... 2 Teaspoons Every 12 Hours As Needed 10)  Tessalon 200 Mg Caps (Benzonatate) .... Add 1 Capsule Every 8 Hours As Needed 11)  Reclast 5 Mg/159ml Soln (Zoledronic Acid) .... Infusion Yearly 12)  Losartan Potassium 50 Mg Tabs (Losartan Potassium) .Marland Kitchen.. 1 By Mouth Daily 13)  Trimethoprim 100 Mg Tabs (Trimethoprim) .... Take 1 At Bedtime  Allergies (verified): 1)  ! Percocet 2)  ! Codeine 3)  ! * Sodium Penathol 4)  !  Levaquin 5)  ! Sulfa 6)  ! Sulfa 7)  ! Adhesive Tape  Past History:  Past Medical History: Hypertension HEART MURMUR, HX OF   RHINOSINUSITIS, ALLERGIC      - [facility] eval 2009 ok  NEPHROLITHIASIS, hx of MENINGIOMA OSTEOARTHRITIS  COUGH onset around 20...........................................................Marland KitchenWert    - EGD c/w es dysmotility 09/29/07    - Gastric emptying study nl 07/26/07    - CT Chest nl 05/04/07     -Initial consult dictated Jan 19, 2009 try off biphosphonate/fish oil > rx ppi two times a day and zantac at hs     - February 20, 2009 add reglan 10 mg ac and hs x 1 month trial Osteoporosis --BMD -3.0 (06/13/09)- (intolerant to bisphosphanate) Reclast rx 07/28/2009 --vitamin d 66 (10/10)   MD rooster: pulm - wert GI- gessner ent - [facility] - Allusio cards - hochrein  Review of Systems  The patient denies fever, weight loss, syncope, headaches, and abdominal pain.    Physical Exam  General:  alert, well-developed, well-nourished, and cooperative to examination.    Lungs:  normal respiratory effort, no intercostal retractions or use of accessory muscles; normal breath sounds bilaterally - no crackles and no wheezes.    Heart:  normal rate, regular rhythm, no murmur, and no rub. BLE without edema. Skin:  redness 1.5s=cm area surrounding scab on right ant shin at site of skin tear - no drainage or purulence - min inc warmth to this aera -  Psych:  Oriented X3, memory intact for recent and remote, normally interactive, good eye contact, not anxious appearing, not depressed appearing, and not agitated.      Impression & Recommendations:  Problem # 1:  CELLULITIS, LEG, RIGHT (ICD-682.6)  minimal symptoms but tx abx emperically due to inc redness at site of open wound - erx for doxy done cont daily dsg change and wound care - Her updated medication list for this problem includes:    Trimethoprim 100 Mg Tabs (Trimethoprim) .Marland Kitchen... Take 1 at bedtime     Doxycycline Hyclate 100 Mg Caps (Doxycycline hyclate) .Marland Kitchen... 1 by mouth two times a day x 5 days  Elevate affected area. Warm moist compresses for 20 minutes every 2 hours while awake. Take antibiotics as directed and take acetaminophen as needed. To be seen in 48-72 hours if no improvement, sooner if worse.  Orders: Prescription Created Electronically [id number])  Problem # 2:  HYPERTENSION (ICD-401.9)  Her updated medication list for this problem includes:    Losartan Potassium 50 Mg Tabs (Losartan potassium) .Marland Kitchen... 1 by mouth daily  I reviewed her blood pressure diary today. She is predominantly well controlled with occasional elevated readings. She is very symptomatic when blood pressure is low (SBP<110).  stay on the losartan but  keep the blood pressure diary as she has been doing.  BP today: 142/82 Prior BP: 162/84 (11/17/2009)  Labs Reviewed: K+: 3.9 (07/15/2009) Creat: : 0.5 (07/15/2009)   Chol: 198 (06/10/2009)   HDL: 89 (06/10/2009)   LDL: 89 (06/10/2009)   TG: 98 (06/10/2009)  Problem # 3:  DYSLIPIDEMIA (ICD-272.4)  plan check of FLP next OV, to come in fasting - prior values reviewed - cont same Her updated medication list for this problem includes:    Simvastatin 5 Mg Tabs (Simvastatin) .Marland Kitchen... At bedtime  Labs Reviewed: SGOT: 24 (06/10/2009)   SGPT: 20 (06/10/2009)   HDL:89 (06/10/2009)  LDL:89 (06/10/2009)  Chol:198 (06/10/2009)  Trig:98 (06/10/2009)  Complete Medication List: 1)  Aspirin 81 Mg Tbec (Aspirin) .... Once daily 2)  Nexium 40 Mg Cpdr (Esomeprazole magnesium) .... Take one 30-60 min before first and last meals of the day 3)  Simvastatin 5 Mg Tabs (Simvastatin) .... At bedtime 4)  Calcium-vitamin D 500-200 Mg-unit Tabs (Calcium carbonate-vitamin d) .... Take 1 tablet by mouth two times a day 5)  Vitamin D3 2000 Unit Caps (Cholecalciferol) .... Take 1 capsule by mouth once a day 6)  Centrum Silver Tabs (Multiple vitamins-minerals) .... Take 1 tablet by mouth once  a day 7)  Fluticasone Propionate 50 Mcg/act Susp (Fluticasone propionate) .Marland Kitchen.. 1-2 sprays each nostril once daily 8)  Chlor-trimeton 4 Mg Tabs (Chlorpheniramine maleate) .Marland Kitchen.. 1 every 4 hours as needed 9)  Delsym 30 Mg/83ml Lqcr (Dextromethorphan polistirex) .... 2 teaspoons every 12 hours as needed 10)  Tessalon 200 Mg Caps (Benzonatate) .... Add 1 capsule every 8 hours as needed 11)  Reclast 5 Mg/1101ml Soln (Zoledronic acid) .... Infusion yearly 12)  Losartan Potassium 50 Mg Tabs (Losartan potassium) .Marland Kitchen.. 1 by mouth daily 13)  Trimethoprim 100 Mg Tabs (Trimethoprim) .... Take 1 at bedtime 14)  Doxycycline Hyclate 100 Mg Caps (Doxycycline hyclate) .Marland Kitchen.. 1 by mouth two times a day x 5 days  Patient Instructions: 1)  it was good to see you today. 2)  treat the skin wound with 5 days antibiotics - doxycycline - your prescription has been electronically submitted to your pharmacy. Please take as directed. Contact our office if you believe you're having problems with the medication(s).  3)  continue to wash and cover with polysporin as needed until healed - ok to cover the open sore with band-aid 4)  Please schedule a follow-up appointment in 4 months, sooner if problems. come for AM appointment fasting so we can do blood work next visit Prescriptions: DOXYCYCLINE HYCLATE 100 MG CAPS (DOXYCYCLINE HYCLATE) 1 by mouth two times a day x 5 days  #10 x 0   Entered and Authorized by:   Newt Lukes MD   Signed by:   Newt Lukes MD on 01/23/2010   Method used:   Electronically to        [facility]  [phone]* (retail)       [address] [place], [place]  [address]       Ph: [phone] or [phone]       Fax: [phone]   RxID:   [id number]

## 2010-10-06 NOTE — Assessment & Plan Note (Signed)
Summary: 4 MTH FU  STC   Vital Signs:  Patient profile:   77 year old female Height:      63 inches (160.02 cm) Weight:      135.4 pounds (61.55 kg) O2 Sat:      95 % on Room air Temp:     97.7 degrees F (36.50 degrees C) oral Pulse rate:   71 / minute BP sitting:   120 / 76  (left arm) Cuff size:   regular  Vitals Entered By: Orlan Leavens RMA (June 05, 2010 8:27 AM)  O2 Flow:  Room air CC: 4 month follow-up Is Patient Diabetic? No Pain Assessment Patient in pain? no        Primary Care Provider:  Newt Lukes MD  CC:  4 month follow-up.  History of Present Illness: here for followup  1) asthma/reactiv airway dz - follows with our pulm for same - Wert reports compliance with ongoing medical treatment and no changes in medication dose or frequency. denies adverse side effects related to current therapy. no change in breathing with season changes  2) dyslipidemia -  reports compliance with ongoing medical treatment and no changes in medication dose or frequency. denies adverse side effects related to current therapy. no GI or muscle problems reported  3) HTN - reports compliance with ongoing medical treatment and no changes in medication dose or frequency. denies adverse side effects related to current therapy.  variable readings at home with SBP range 110-150s - has seen cards for same -   4) osteoporosis - reports compliance with ongoing medical treatment and no changes in medication dose or frequency. denies adverse side effects related to current therapy. reclast annually begun 07/2009 as intol to oral bisphos -  but took fosamax >15years prior - hx rib fx and pelvic fx 2008 - pt concenred about brittle bone causing fx and wants to hold off on 2nd reclast in 07/2010   Clinical Review Panels:  Immunizations   Last Tetanus Booster:  Historical (09/07/2007)   Last Flu Vaccine:  Historical pt states given @ walkin clinic (05/18/2010)   Last Pneumovax:   Historical (09/07/2007)  Lipid Management   Cholesterol:  198 (06/10/2009)   LDL (bad choesterol):  89 (06/10/2009)   HDL (good cholesterol):  89 (06/10/2009)   Triglycerides:  98 (06/10/2009)  CBC   WBC:  7.4 (06/10/2009)   Hgb:  14.3 (06/10/2009)   Platelets:  182 (06/10/2009)  Complete Metabolic Panel   Glucose:  89 (07/15/2009)   Sodium:  140 (07/15/2009)   Potassium:  3.9 (07/15/2009)   Chloride:  102 (07/15/2009)   CO2:  30 (07/15/2009)   BUN:  19 (07/15/2009)   Creatinine:  0.5 (07/15/2009)   Albumin:  4.4 (06/10/2009)   Total Protein:  6.5 (06/10/2009)   Calcium:  9.5 (07/15/2009)   Total Bili:  0.7 (06/10/2009)   Alk Phos:  97 (06/10/2009)   SGPT (ALT):  20 (06/10/2009)   SGOT (AST):  24 (06/10/2009)   Current Medications (verified): 1)  Aspirin 81 Mg Tbec (Aspirin) .... Once Daily 2)  Nexium 40 Mg Cpdr (Esomeprazole Magnesium) .... Take One 30-60 Min Before First and Last Meals of The Day 3)  Simvastatin 5 Mg Tabs (Simvastatin) .... At Bedtime 4)  Calcium-Vitamin D 500-200 Mg-Unit Tabs (Calcium Carbonate-Vitamin D) .... Take 1 Tablet By Mouth Two Times A Day 5)  Vitamin D3 2000 Unit Caps (Cholecalciferol) .... Take 1 Capsule By Mouth Once A Day 6)  Centrum  Silver  Tabs (Multiple Vitamins-Minerals) .... Take 1 Tablet By Mouth Once A Day 7)  Fluticasone Propionate 50 Mcg/act Susp (Fluticasone Propionate) .Marland Kitchen.. 1-2 Sprays Each Nostril Once Daily 8)  Chlor-Trimeton 4 Mg Tabs (Chlorpheniramine Maleate) .Marland Kitchen.. 1 Every 4 Hours As Needed 9)  Delsym 30 Mg/68ml Lqcr (Dextromethorphan Polistirex) .... 2 Teaspoons Every 12 Hours As Needed 10)  Tessalon 200 Mg Caps (Benzonatate) .... Add 1 Capsule Every 8 Hours As Needed 11)  Reclast 5 Mg/177ml Soln (Zoledronic Acid) .... Infusion Yearly 12)  Losartan Potassium 50 Mg Tabs (Losartan Potassium) .Marland Kitchen.. 1 By Mouth Daily 13)  Trimethoprim 100 Mg Tabs (Trimethoprim) .... Take 1 At Bedtime  Allergies (verified): 1)  ! Percocet 2)  !  Codeine 3)  ! * Sodium Penathol 4)  ! Levaquin 5)  ! Sulfa 6)  ! Sulfa 7)  ! Adhesive Tape  Past History:  Past Medical History: Hypertension HEART MURMUR, HX - mitral regurg, mild (echo 12/2009) RHINOSINUSITIS, ALLERGIC      - [facility] eval 2009 ok  NEPHROLITHIASIS, hx of MENINGIOMA OSTEOARTHRITIS  COUGH onset around 1980...........................................................Marland KitchenWert    - EGD c/w es dysmotility 09/29/07    - Gastric emptying study nl 07/26/07    - CT Chest nl 05/04/07     -Initial consult dictated Jan 19, 2009 try off biphosphonate/fish oil > rx ppi two times a day and zantac at hs     - February 20, 2009 add reglan 10 mg ac and hs x 1 month trial Osteoporosis --BMD -3.0 (06/13/09)- (intolerant to bisphosphanate) Reclast rx 07/28/2009 --vitamin d 66 (10/10)   MD roster: pulm - wert GI- gessner ent - [facility] - Allusio cards - hochrein  Review of Systems  The patient denies fever, weight loss, chest pain, syncope, and headaches.         c/o episodic fatigue and recent hair loss, no bowel  Physical Exam  General:  alert, well-developed, well-nourished, and cooperative to examination.    Lungs:  normal respiratory effort, no intercostal retractions or use of accessory muscles; normal breath sounds bilaterally - no crackles and no wheezes.    Heart:  normal rate, regular rhythm, no murmur, and no rub. BLE without edema.   Impression & Recommendations:  Problem # 1:  OSTEOPOROSIS (ICD-733.00)  Her updated medication list for this problem includes:    Reclast 5 Mg/148ml Soln (Zoledronic acid) ..... Infusion 07/2009 - on hold for now  hx pelvic/rib fx 2008 cont tx and daily Ca+D as ongoing ?brittle bone due to bisphos tx >15y - pt wishes to take drug holiday - asked to consider prolia as needed  Problem # 2:  DYSLIPIDEMIA (ICD-272.4)  Her updated medication list for this problem includes:    Simvastatin 5 Mg Tabs (Simvastatin) .Marland Kitchen... At  bedtime  Orders: TLB-Lipid Panel ([id number])  Labs Reviewed: SGOT: 24 (06/10/2009)   SGPT: 20 (06/10/2009)   HDL:89 (06/10/2009)  LDL:89 (06/10/2009)  Chol:198 (06/10/2009)  Trig:98 (06/10/2009)  Problem # 3:  HYPERTENSION (ICD-401.9)  Her updated medication list for this problem includes:    Losartan Potassium 50 Mg Tabs (Losartan potassium) .Marland Kitchen... 1 by mouth daily  I reviewed her blood pressure diary today. She is predominantly well controlled with occasional elevated readings. She is very symptomatic when blood pressure is low (SBP<110).  stay on the losartan but keep the blood pressure diary as she has been doing.  Prior BP: 162/84 (11/17/2009)  BP today: 120/76 Prior BP: 142/82 (01/23/2010)  Labs Reviewed: K+: 3.9 (  07/15/2009) Creat: : 0.5 (07/15/2009)   Chol: 198 (06/10/2009)   HDL: 89 (06/10/2009)   LDL: 89 (06/10/2009)   TG: 98 (06/10/2009)  Problem # 4:  FATIGUE (ICD-780.79) nonspecific hx and exam - check labs Orders: TLB-CBC Platelet - w/Differential ([id number]-[id number]) TLB-TSH (Thyroid Stimulating Hormone) ([id number])  Complete Medication List: 1)  Aspirin 81 Mg Tbec (Aspirin) .... Once daily 2)  Nexium 40 Mg Cpdr (Esomeprazole magnesium) .... Take one 30-60 min before first and last meals of the day 3)  Simvastatin 5 Mg Tabs (Simvastatin) .... At bedtime 4)  Calcium-vitamin D 500-200 Mg-unit Tabs (Calcium carbonate-vitamin d) .... Take 1 tablet by mouth two times a day 5)  Vitamin D3 2000 Unit Caps (Cholecalciferol) .... Take 1 capsule by mouth once a day 6)  Centrum Silver Tabs (Multiple vitamins-minerals) .... Take 1 tablet by mouth once a day 7)  Fluticasone Propionate 50 Mcg/act Susp (Fluticasone propionate) .Marland Kitchen.. 1-2 sprays each nostril once daily 8)  Chlor-trimeton 4 Mg Tabs (Chlorpheniramine maleate) .Marland Kitchen.. 1 every 4 hours as needed 9)  Delsym 30 Mg/85ml Lqcr (Dextromethorphan polistirex) .... 2 teaspoons every 12 hours as needed 10)  Tessalon 200 Mg Caps  (Benzonatate) .... Add 1 capsule every 8 hours as needed 11)  Reclast 5 Mg/17ml Soln (Zoledronic acid) .... Infusion 07/2009 - on hold for now 12)  Losartan Potassium 50 Mg Tabs (Losartan potassium) .Marland Kitchen.. 1 by mouth daily 13)  Trimethoprim 100 Mg Tabs (Trimethoprim) .... Take 1 at bedtime  Other Orders: TLB-Hepatic/Liver Function Pnl ([id number])  Patient Instructions: 1)  it was good to see you today. 2)  test(s) ordered today - your results will be mailed to you after review in 48-72 hours from the time of test completion; if any changes need to be made or there are abnormal results, you will be contacted directly.  3)  will hold reclast for now - consoider prolia treatment for bones if needed 4)  Please schedule a follow-up appointment in 6 months, sooner if problems. come for AM appointment fasting so we can do blood work next visit   Immunization History:  Influenza Immunization History:    Influenza:  historical pt states given @ walkin clinic (05/18/2010)

## 2010-10-06 NOTE — Assessment & Plan Note (Signed)
Summary: np6/htn  Medications Added LOSARTAN POTASSIUM 25 MG TABS (LOSARTAN POTASSIUM) one daily      Allergies Added:   Visit Type:  Initial Consult Primary Provider:  Newt Lukes MD  CC:  HTN and MVP.  History of Present Illness: The patient presents as a new patient for evaluation of mitral valve prolapse, difficult to control hypertension and a previous suggestion of a cardiomyopathy. She was diagnosed with mitral valve prolapse in [place] years ago. She was seen by a cardiologist here from another group. She had an echocardiogram and stress test in 2006. I don't have these results. She was told she had mitral valve prolapse and that her heart muscle was getting weak. She required no specific therapy for this. She has been followed for management of hypertension which has fluctuated. In fact she's had more lows than hives recently and I reviewed her blood pressure diary. She's had no new cardiovascular complaints but wanted cardiology followup.  She denies any chest pressure, neck or arm discomfort.  She does not feel palpitations though she is noted to have skipped heartbeats frequently on exam. She has not had presyncope or syncope. She does not have shortness of breath, PND or orthopnea. She has had a chronic cough but is having this treated by Dr. Sherene Sires with improvement.  Current Medications (verified): 1)  Aspirin 81 Mg Tbec (Aspirin) .... Once Daily 2)  Nexium 40 Mg Cpdr (Esomeprazole Magnesium) .... Take One 30-60 Min Before First and Last Meals of The Day 3)  Simvastatin 5 Mg Tabs (Simvastatin) .... At Bedtime 4)  Calcium-Vitamin D 500-200 Mg-Unit Tabs (Calcium Carbonate-Vitamin D) .... Take 1 Tablet By Mouth Two Times A Day 5)  Vitamin D3 2000 Unit Caps (Cholecalciferol) .... Take 1 Capsule By Mouth Once A Day 6)  Centrum Silver  Tabs (Multiple Vitamins-Minerals) .... Take 1 Tablet By Mouth Once A Day 7)  Fluticasone Propionate 50 Mcg/act Susp (Fluticasone Propionate) .Marland Kitchen..  1-2 Sprays Each Nostril Once Daily 8)  Chlor-Trimeton 4 Mg Tabs (Chlorpheniramine Maleate) .Marland Kitchen.. 1 Every 4 Hours As Needed 9)  Delsym 30 Mg/34ml Lqcr (Dextromethorphan Polistirex) .... 2 Teaspoons Every 12 Hours As Needed 10)  Tessalon 200 Mg Caps (Benzonatate) .... Add 1 Capsule Every 8 Hours As Needed 11)  Nitrofurantoin Macrocrystal 100 Mg Caps (Nitrofurantoin Macrocrystal) .... Take 1 Capsule By Mouth Once A Day 12)  Reclast 5 Mg/168ml Soln (Zoledronic Acid) .... Infusion Yearly 13)  Losartan Potassium 50 Mg Tabs (Losartan Potassium) .Marland Kitchen.. 1 By Mouth Daily  Allergies (verified): 1)  ! Percocet 2)  ! Codeine 3)  ! * Sodium Penathol 4)  ! Levaquin 5)  ! Sulfa 6)  ! Sulfa 7)  ! Adhesive Tape  Past History:  Past Medical History: Hypertension HEART MURMUR, HX OF  RHINOSINUSITIS, ALLERGIC      - [facility] eval 2009 ok  NEPHROLITHIASIS, hx of MENINGIOMA OSTEOARTHRITIS  COUGH onset around 43...........................................................Marland KitchenWert    - EGD c/w es dysmotility 09/29/07    - Gastric emptying study nl 07/26/07    - CT Chest nl 05/04/07     -Initial consult dictated Jan 19, 2009 try off biphosphonate/fish oil > rx ppi two times a day and zantac at hs     - February 20, 2009 add reglan 10 mg ac and hs x 1 month trial Osteoporosis --BMD -3.0 (06/13/09)- (intolerant to bisphosphanate) Reclast rx 07/28/2009 --vitamin d 66 (10/10)  Past Surgical History: Reviewed history from 09/24/2009 and no changes required. Cholecystectomy hysterectomy breast  surgery Appendectomy knee surgery cataract surgery (2000) Lip surgery, cx (2002)  Family History: Reviewed history from 02/12/2009 and no changes required. Family History of Breast Cancer:Sister Family History of Heart Disease: Mother, Father & Siblings  Social History: Occupation:Retired Patient has never smoked.  Alcohol Use - yes-wine Daily Caffeine Use Illicit Drug Use - no Married, lives with spouse Enjoys  golf  Review of Systems       As stated in the HPI and negative for all other systems.   Vital Signs:  Patient profile:   77 year old female Height:      63 inches Weight:      136 pounds BMI:     24.18 Pulse rate:   68 / minute Resp:     16 per minute BP sitting:   162 / 84  (right arm)  Vitals Entered By: Marrion Coy, CNA (November 17, 2009 11:44 AM)  Physical Exam  General:  Well developed, well nourished, in no acute distress. Head:  normocephalic and atraumatic Eyes:  PERRLA/EOM intact; conjunctiva and lids normal. Mouth:  Oral mucosa normal. Neck:  Neck supple, no JVD. No masses, thyromegaly or abnormal cervical nodes. Chest Wall:  no deformities or breast masses noted Lungs:  Clear bilaterally to auscultation and percussion. Abdomen:  Bowel sounds positive; abdomen soft and non-tender without masses, organomegaly, or hernias noted. No hepatosplenomegaly. Msk:  Back normal, normal gait. Muscle strength and tone normal. Extremities:  No clubbing or cyanosis. Neurologic:  Alert and oriented x 3. Skin:  Intact without lesions or rashes. Cervical Nodes:  no significant adenopathy Inguinal Nodes:  no significant adenopathy Psych:  Normal affect.   Detailed Cardiovascular Exam  Neck    Carotids: Carotids full and equal bilaterally without bruits.      Neck Veins: Normal, no JVD.    Heart    Inspection: no deformities or lifts noted.      Palpation: normal PMI with no thrills palpable.      Auscultation: regular rate and rhythm, S1, S2 without murmurs, rubs, gallops, or clicks.    Vascular    Abdominal Aorta: no palpable masses, pulsations, or audible bruits.      Femoral Pulses: normal femoral pulses bilaterally.      Pedal Pulses: normal pedal pulses bilaterally.      Radial Pulses: normal radial pulses bilaterally.      Peripheral Circulation: no clubbing, cyanosis, or edema noted with normal capillary refill.     EKG  Procedure date:   11/17/2009  Findings:      sinus rhythm, rate 68, axis within normal limits, intervals within normal limits, no acute ST-T wave changes.  Impression & Recommendations:  Problem # 1:  MITRAL VALVE PROLAPSE (ICD-424.0) She has a history of mitral valve prolapse though I did not appreciate this on exam. She also has a vague history of "weak heart muscle" as told to her by another cardiologist. At this point I plan a followup echocardiogram to further investigate this. No change in therapy is planned currently. Orders: EKG w/ Interpretation (93000) Echocardiogram (Echo)  Problem # 2:  DYSLIPIDEMIA (ICD-272.4) I reviewed her most recent lipid profile. Her HDL was 89 and LDL 89. This is an excellent regimen. She will continue the meds as listed. Her updated medication list for this problem includes:    Simvastatin 5 Mg Tabs (Simvastatin) .Marland Kitchen... At bedtime  Problem # 3:  HYPERTENSION (ICD-401.9) She was recently given a prescription for hydrochlorothiazide in addition to the losartan. She  is worried about low blood pressures. I reviewed her blood pressure diary. She is predominantly well controlled with occasional elevated readings but more frequent blood pressures in the 100 or 110 range. She is very symptomatic with these. Therefore, I agree with her reservations about starting hydrochlorothiazide and would suggested she stay on the losartan but keep the blood pressure diary as she has been doing. Orders: EKG w/ Interpretation (93000)  Patient Instructions: 1)  Your physician recommends that you schedule a follow-up appointment in: 12 months with Dr Antoine Poche 2)  Your physician recommends that you continue on your current medications as directed. Please refer to the Current Medication list given to you today. 3)  Your physician has requested that you have an echocardiogram.  Echocardiography is a painless test that uses sound waves to create images of your heart. It provides your doctor with  information about the size and shape of your heart and how well your heart's chambers and valves are working.  This procedure takes approximately one hour. There are no restrictions for this procedure. Prescriptions: LOSARTAN POTASSIUM 25 MG TABS (LOSARTAN POTASSIUM) one daily  #30 x 11   Entered by:   Charolotte Capuchin, RN   Authorized by:   Rollene Rotunda, MD, [facility]   Signed by:   Charolotte Capuchin, RN on 11/17/2009   Method used:   Electronically to        [facility]  [phone]* (retail)       [address] [place], [place]  [address]       Ph: [phone] or [phone]       Fax: [phone]   RxID:   [id number]

## 2010-10-13 ENCOUNTER — Telehealth: Payer: Self-pay | Admitting: Internal Medicine

## 2010-10-14 ENCOUNTER — Telehealth: Payer: Self-pay | Admitting: Internal Medicine

## 2010-10-22 NOTE — Progress Notes (Signed)
Summary: med refills  Phone Note Refill Request Message from:  Fax from Pharmacy on October 13, 2010 10:59 AM  Refills Requested: Medication #1:  SIMVASTATIN 5 MG TABS at bedtime  Medication #2:  NEXIUM 40 MG CPDR Take one 30-60 min before first and last meals of the day [phone]   Method Requested: Electronic Initial call taken by: Orlan Leavens RMA,  October 13, 2010 10:59 AM    Prescriptions: SIMVASTATIN 5 MG TABS (SIMVASTATIN) at bedtime  #30 x 6   Entered by:   Orlan Leavens RMA   Authorized by:   Newt Lukes MD   Signed by:   Orlan Leavens RMA on 10/13/2010   Method used:   Electronically to        [facility]  [phone]* (retail)       [address] [place], [place]  [address]       Ph: [phone] or [phone]       Fax: [phone]   RxID:   [id number] NEXIUM 40 MG CPDR (ESOMEPRAZOLE MAGNESIUM) Take one 30-60 min before first and last meals of the day  #180 x 1   Entered by:   Orlan Leavens RMA   Authorized by:   Newt Lukes MD   Signed by:   Orlan Leavens RMA on 10/13/2010   Method used:   Electronically to        [facility]  [phone]* (retail)       [address] [place], [place]  [address]       Ph: [phone] or [phone]       Fax: [phone]   RxID:   [id number]

## 2010-10-22 NOTE — Progress Notes (Signed)
Summary: 90 simvastatin  Phone Note From Pharmacy   Caller: [facility] [address]  [phone]* Summary of Call: Pt is requesting a 90 day supply on her simvastain 5mg . Is this ok? Initial call taken by: Orlan Leavens RMA,  October 14, 2010 1:20 PM    Prescriptions: SIMVASTATIN 5 MG TABS (SIMVASTATIN) at bedtime  #90 x 1   Entered by:   Orlan Leavens RMA   Authorized by:   Newt Lukes MD   Signed by:   Orlan Leavens RMA on 10/14/2010   Method used:   Electronically to        [facility]  [phone]* (retail)       [address] [place], [place]  [address]       Ph: [phone] or [phone]       Fax: [phone]   RxID:   [id number]

## 2010-11-19 ENCOUNTER — Ambulatory Visit: Payer: Self-pay | Admitting: Cardiology

## 2010-11-25 ENCOUNTER — Encounter: Payer: Self-pay | Admitting: *Deleted

## 2010-12-07 ENCOUNTER — Ambulatory Visit (INDEPENDENT_AMBULATORY_CARE_PROVIDER_SITE_OTHER): Payer: Medicare Other | Admitting: Internal Medicine

## 2010-12-07 ENCOUNTER — Other Ambulatory Visit (INDEPENDENT_AMBULATORY_CARE_PROVIDER_SITE_OTHER): Payer: Medicare Other

## 2010-12-07 ENCOUNTER — Encounter: Payer: Self-pay | Admitting: Internal Medicine

## 2010-12-07 ENCOUNTER — Telehealth: Payer: Self-pay | Admitting: Internal Medicine

## 2010-12-07 DIAGNOSIS — E785 Hyperlipidemia, unspecified: Secondary | ICD-10-CM

## 2010-12-07 DIAGNOSIS — I10 Essential (primary) hypertension: Secondary | ICD-10-CM

## 2010-12-07 DIAGNOSIS — Z79899 Other long term (current) drug therapy: Secondary | ICD-10-CM

## 2010-12-07 DIAGNOSIS — M810 Age-related osteoporosis without current pathological fracture: Secondary | ICD-10-CM

## 2010-12-07 LAB — LIPID PANEL
HDL: 62.5 mg/dL (ref >39.00)
LDL Cholesterol: 74 mg/dL (ref 0–99)
Total CHOL/HDL Ratio: 2
Triglycerides: 71 mg/dL (ref 0.0–149.0)

## 2010-12-07 LAB — HEPATIC FUNCTION PANEL: Albumin: 3.7 g/dL (ref 3.5–5.2)

## 2010-12-07 MED ORDER — FISH OIL 1200 MG PO CAPS: 1.0000 | ORAL_CAPSULE | Freq: Two times a day (BID) | ORAL | Status: DC

## 2010-12-07 MED ORDER — ESOMEPRAZOLE MAGNESIUM 40 MG PO CPDR: 40.0000 mg | DELAYED_RELEASE_CAPSULE | Freq: Two times a day (BID) | ORAL | Status: DC

## 2010-12-07 NOTE — Assessment & Plan Note (Signed)
The current medical regimen is effective;  continue present plan and medications. Check labs now 

## 2010-12-07 NOTE — Telephone Encounter (Signed)
Please call patient - normal results. No medication changes recommended (resumed fish oil at visit). Please also mail copy of labs to patient as per her request. Thanks.   Lab Results  Component Value Date   WBC 7.5 06/05/2010   HGB 14.1 06/05/2010   HCT 41.0 06/05/2010   PLT 197.0 06/05/2010   CHOL 151 12/07/2010   TRIG 71.0 12/07/2010   HDL 62.50 12/07/2010   ALT 23 12/07/2010   AST 21 12/07/2010   NA 140 07/15/2009   K 3.9 07/15/2009   CL 102 07/15/2009   CREATININE 0.5 07/15/2009   BUN 19 07/15/2009   CO2 30 07/15/2009   TSH 2.47 06/05/2010

## 2010-12-07 NOTE — Assessment & Plan Note (Addendum)
Intol of oral bisphos Given reclast 07/2009 but declined 2nd treatment 07/2010 Last dexa 06/13/09 reviewed: -3.0 at L fem

## 2010-12-07 NOTE — Assessment & Plan Note (Signed)
The current medical regimen is effective;  continue present plan and medications. BP Readings from Last 3 Encounters:  12/07/10 140/78  06/05/10 120/76  01/23/10 142/82   Lab Results  Component Value Date   CREATININE 0.5 07/15/2009

## 2010-12-07 NOTE — Telephone Encounter (Signed)
Pt Notified with lab results, also mailed copy yo pt address...12/07/10@4 :13pm/LMB

## 2010-12-07 NOTE — Progress Notes (Signed)
Subjective:    Patient ID: Chelsea Jordan, female    DOB: [birth year], 77 y.o.   MRN: [id number]  HPI here for followup  1) asthma/reactiv airway dz - follows with our pulm for same - Wert reports compliance with ongoing medical treatment and no changes in medication dose or frequency. denies adverse side effects related to current therapy. no change in breathing with season changes  2) dyslipidemia -  reports compliance with ongoing medical treatment and no changes in medication dose or frequency. denies adverse side effects related to current therapy. no GI or muscle problems reported  3) HTN - reports compliance with ongoing medical treatment and no changes in medication dose or frequency. denies adverse side effects related to current therapy.  variable readings at home with SBP range 110-150s - has seen cards for same -   4) osteoporosis - reports compliance with ongoing medical treatment and no changes in medication dose or frequency. denies adverse side effects related to current therapy. reclast annually begun 07/2009 as intol to oral bisphos -  but took fosamax >15years prior - hx rib fx and pelvic fx 2008 - pt concerned about brittle bone causing fx and skipped 2nd reclast in 07/2010  Past Medical History  Diagnosis Date  . DYSLIPIDEMIA 09/24/2009  . HYPERTENSION 09/24/2009  . MITRAL VALVE PROLAPSE 11/17/2009  . NEPHROLITHIASIS 10/20/2007  . GENERALIZED OSTEOARTHROSIS UNSPECIFIED SITE 09/24/2009  . OSTEOARTHRITIS 10/20/2007  . OSTEOPOROSIS 07/15/2009  . HEART MURMUR, HX OF 10/20/2007  . COLONIC POLYPS, HX OF 02/12/2009  . RENAL CALCULUS, HX OF 09/24/2009   Review of Systems  Constitutional: Positive for fatigue. Negative for fever and unexpected weight change.  Respiratory: Negative for cough.   Cardiovascular: Negative for chest pain.  Genitourinary: Negative for dysuria.      Objective:   Physical Exam BP 140/78  Pulse 77  Temp(Src) 97.7 F (36.5 C) (Oral)  Ht 5\' 3"   (1.6 m)  Wt 134 lb 6.4 oz (60.963 kg)  BMI 23.81 kg/m2 Wt Readings from Last 3 Encounters:  12/07/10 134 lb 6.4 oz (60.963 kg)  06/05/10 135 lb 6.4 oz (61.417 kg)  01/23/10 135 lb (61.236 kg)   Physical Exam  Constitutional: She is oriented to person, place, and time. She appears well-developed and well-nourished. No distress.  Eyes: Conjunctivae and EOM are normal. Pupils are equal, round, and reactive to light. No scleral icterus.  Neck: Normal range of motion. Neck supple. No JVD present. No thyromegaly present.  Cardiovascular: Normal rate, regular rhythm and normal heart sounds.  No murmur heard. Pulmonary/Chest: Effort normal and breath sounds normal. No respiratory distress. She has no wheezes.  Neurological: She is alert and oriented to person, place, and time. No cranial nerve deficit. Coordination normal.  Skin: Skin is warm and dry. No rash noted. No erythema.  Psychiatric: She has a normal mood and affect. Her behavior is normal. Judgment and thought content normal.   Lab Results  Component Value Date   WBC 7.5 06/05/2010   HGB 14.1 06/05/2010   HCT 41.0 06/05/2010   PLT 197.0 06/05/2010   CHOL 189 06/05/2010   TRIG 60.0 06/05/2010   HDL 86.90 06/05/2010   ALT 17 06/05/2010   AST 21 06/05/2010   NA 140 07/15/2009   K 3.9 07/15/2009   CL 102 07/15/2009   CREATININE 0.5 07/15/2009   BUN 19 07/15/2009   CO2 30 07/15/2009   TSH 2.47 06/05/2010       Assessment & Plan:  See problem list. Medications and labs reviewed today.

## 2010-12-07 NOTE — Patient Instructions (Signed)
It was good to see you today. Test(s) ordered today. Your results will be mailed to you after review (48-72hours after test completion). If any changes need to be made, you will be notified at that time. Ok to take fish oil as discussed Medications reviewed, no other changes at this time. Refill on medication(s) as discussed today. Please schedule followup in 6 months, call sooner if problems.

## 2010-12-15 LAB — POCT I-STAT, CHEM 8
BUN: 22 mg/dL (ref 6–23)
Calcium, Ion: 1.17 mmol/L (ref 1.12–1.32)
Chloride: 96 mEq/L (ref 96–112)
Creatinine, Ser: 0.9 mg/dL (ref 0.4–1.2)
Glucose, Bld: 107 mg/dL — ABNORMAL HIGH (ref 70–99)
HCT: 47 % — ABNORMAL HIGH (ref 36.0–46.0)
Potassium: 3.3 mEq/L — ABNORMAL LOW (ref 3.5–5.1)

## 2010-12-15 LAB — LIPID PANEL
HDL: 37 mg/dL — ABNORMAL LOW (ref >39)
Total CHOL/HDL Ratio: 3.5 RATIO

## 2010-12-15 LAB — COMPREHENSIVE METABOLIC PANEL
ALT: 25 U/L (ref 0–35)
AST: 25 U/L (ref 0–37)
Alkaline Phosphatase: 42 U/L (ref 39–117)
CO2: 25 mEq/L (ref 19–32)
Calcium: 8.8 mg/dL (ref 8.4–10.5)
Chloride: 109 mEq/L (ref 96–112)
GFR calc Af Amer: >60 mL/min (ref >60)
Potassium: 4.1 mEq/L (ref 3.5–5.1)
Sodium: 137 mEq/L (ref 135–145)
Total Bilirubin: 0.5 mg/dL (ref 0.3–1.2)

## 2010-12-15 LAB — URINALYSIS, ROUTINE W REFLEX MICROSCOPIC
Glucose, UA: NEGATIVE mg/dL
Hgb urine dipstick: NEGATIVE
Protein, ur: 100 mg/dL — AB
Specific Gravity, Urine: 1.023 (ref 1.005–1.030)
Urobilinogen, UA: 2 mg/dL — ABNORMAL HIGH (ref 0.0–1.0)

## 2010-12-15 LAB — CBC
Hemoglobin: 11.7 g/dL — ABNORMAL LOW (ref 12.0–15.0)
Hemoglobin: 14.8 g/dL (ref 12.0–15.0)
MCHC: 34.2 g/dL (ref 30.0–36.0)
MCHC: 33.7 g/dL (ref 30.0–36.0)
MCHC: 34 g/dL (ref 30.0–36.0)
MCV: 91.1 fL (ref 78.0–100.0)
RBC: 3.89 MIL/uL (ref 3.87–5.11)
RBC: 3.81 MIL/uL — ABNORMAL LOW (ref 3.87–5.11)
RBC: 4.78 MIL/uL (ref 3.87–5.11)
RDW: 12 % (ref 11.5–15.5)
WBC: 4.6 10*3/uL (ref 4.0–10.5)
WBC: 10 10*3/uL (ref 4.0–10.5)

## 2010-12-15 LAB — CK TOTAL AND CKMB (NOT AT ARMC): CK, MB: 2.2 ng/mL (ref 0.3–4.0)

## 2010-12-15 LAB — DIFFERENTIAL
Basophils Relative: 0 % (ref 0–1)
Lymphocytes Relative: 4 % — ABNORMAL LOW (ref 12–46)
Lymphs Abs: 0.4 10*3/uL — ABNORMAL LOW (ref 0.7–4.0)
Monocytes Absolute: 0.4 10*3/uL (ref 0.1–1.0)
Monocytes Relative: 4 % (ref 3–12)
Neutro Abs: 9.2 10*3/uL — ABNORMAL HIGH (ref 1.7–7.7)
Neutrophils Relative %: 92 % — ABNORMAL HIGH (ref 43–77)

## 2010-12-15 LAB — MAGNESIUM
Magnesium: 1.8 mg/dL (ref 1.5–2.5)
Magnesium: 1.8 mg/dL (ref 1.5–2.5)

## 2010-12-15 LAB — URINE MICROSCOPIC-ADD ON

## 2010-12-15 LAB — BASIC METABOLIC PANEL
CO2: 22 mEq/L (ref 19–32)
Calcium: 8.2 mg/dL — ABNORMAL LOW (ref 8.4–10.5)
Creatinine, Ser: 0.54 mg/dL (ref 0.4–1.2)
GFR calc Af Amer: >60 mL/min (ref >60)
GFR calc non Af Amer: >60 mL/min (ref >60)
Sodium: 132 mEq/L — ABNORMAL LOW (ref 135–145)

## 2010-12-15 LAB — CARDIAC PANEL(CRET KIN+CKTOT+MB+TROPI)
Relative Index: INVALID (ref 0.0–2.5)
Relative Index: INVALID (ref 0.0–2.5)
Total CK: 66 U/L (ref 7–177)
Troponin I: <0.01 ng/mL (ref 0.00–0.06)

## 2010-12-15 LAB — TROPONIN I: Troponin I: 0.01 ng/mL (ref 0.00–0.06)

## 2010-12-15 LAB — URINE CULTURE
Colony Count: NO GROWTH
Culture: NO GROWTH

## 2011-01-15 ENCOUNTER — Encounter: Payer: Self-pay | Admitting: Cardiology

## 2011-01-15 ENCOUNTER — Ambulatory Visit (INDEPENDENT_AMBULATORY_CARE_PROVIDER_SITE_OTHER): Payer: Medicare Other | Admitting: Cardiology

## 2011-01-15 DIAGNOSIS — I10 Essential (primary) hypertension: Secondary | ICD-10-CM

## 2011-01-15 DIAGNOSIS — I059 Rheumatic mitral valve disease, unspecified: Secondary | ICD-10-CM

## 2011-01-15 DIAGNOSIS — E785 Hyperlipidemia, unspecified: Secondary | ICD-10-CM

## 2011-01-15 NOTE — Progress Notes (Signed)
HPI The patient returns for one year followup. Since I last saw her she has had no new cardiovascular complaints. She has had some fluctuating blood pressures and at one point had a systolic of 85 but she was having a viral infection at that time. For the most part it's in the 120s systolic. It does go she'll get a little fatigued but she can rest and recover from this. She has had episodes where her blood pressure has been 200 systolic and she has been under stress. She has had no chest pressure, neck or arm discomfort. She has had no shortness of breath, PND or orthopnea. She can golf 18 holes without difficulty.  Allergies  Allergen Reactions  . Codeine   . Levofloxacin   . Oxycodone-Acetaminophen   . Sulfonamide Derivatives     REACTION: GI upset/nausea/vomiting    Current Outpatient Prescriptions  Medication Sig Dispense Refill  . Ascorbic Acid (VITAMIN C) 500 MG tablet Take 500 mg by mouth daily.        Marland Kitchen aspirin 81 MG tablet Take 81 mg by mouth daily.        . benzonatate (TESSALON) 200 MG capsule Take 200 mg by mouth every 8 (eight) hours as needed.        . calcium-vitamin D (CALCIUM 500+D) 500-200 MG-UNIT per tablet Take 1 tablet by mouth daily.        . chlorpheniramine (CHLOR-TRIMETON) 4 MG tablet Take 4 mg by mouth every 4 (four) hours as needed.        . Cholecalciferol (VITAMIN D3) 2000 UNITS TABS Take 2,000 Units by mouth daily.        Marland Kitchen esomeprazole (NEXIUM) 40 MG capsule Take 1 capsule (40 mg total) by mouth 2 (two) times daily.  180 capsule  1  . fluticasone (FLONASE) 50 MCG/ACT nasal spray 2 sprays by Nasal route daily.        Marland Kitchen losartan (COZAAR) 50 MG tablet Take 50 mg by mouth daily.        . Lutein 20 MG TABS Take by mouth daily.        . Multiple Vitamins-Minerals (CENTRUM SILVER) tablet Take 1 tablet by mouth daily.        . Omega-3 Fatty Acids (FISH OIL) 1200 MG CAPS Take 1 capsule (1,200 mg total) by mouth 2 (two) times daily.  60 capsule  3  . simvastatin (ZOCOR)  5 MG tablet Take 5 mg by mouth at bedtime.        Marland Kitchen trimethoprim (TRIMPEX) 100 MG tablet Take 100 mg by mouth at bedtime.        . vitamin E 400 UNIT capsule Take 400 Units by mouth daily.        . zoledronic acid (RECLAST) 5 MG/100ML SOLN Inject 5 mg into the vein once. HOLD FOR NOW         Past Medical History  Diagnosis Date  . DYSLIPIDEMIA 09/24/2009  . HYPERTENSION 09/24/2009  . MITRAL VALVE PROLAPSE 11/17/2009  . NEPHROLITHIASIS 10/20/2007  . GENERALIZED OSTEOARTHROSIS UNSPECIFIED SITE 09/24/2009  . OSTEOARTHRITIS 10/20/2007  . OSTEOPOROSIS 07/15/2009  . HEART MURMUR, HX OF 10/20/2007  . COLONIC POLYPS, HX OF 02/12/2009  . RENAL CALCULUS, HX OF 09/24/2009    Past Surgical History  Procedure Date  . Cholecystectomy   . Abdominal hysterectomy   . Appendectomy   . Breast surgery   . Knee surgery   . Cataract surgery 2000  . Surgery of lip in 2002  ROS:  As stated in the HPI and negative for all other systems.  PHYSICAL EXAM BP 158/92  Pulse 77  Resp 18  Ht 5\' 3"  (1.6 m)  Wt 136 lb (61.689 kg)  BMI 24.09 kg/m2  SpO2 93% GENERAL:  Well appearing HEENT:  Pupils equal round and reactive, fundi not visualized, oral mucosa unremarkable NECK:  No jugular venous distention, waveform within normal limits, carotid upstroke brisk and symmetric, no bruits, no thyromegaly LYMPHATICS:  No cervical, inguinal adenopathy LUNGS:  Clear to auscultation bilaterally BACK:  No CVA tenderness CHEST:  Unremarkable HEART:  PMI not displaced or sustained,S1 and S2 within normal limits, no S3, no S4, no clicks, no rubs, soft systolic murmur at the apex. ABD:  Flat, positive bowel sounds normal in frequency in pitch, no bruits, no rebound, no guarding, no midline pulsatile mass, no hepatomegaly, no splenomegaly EXT:  2 plus pulses throughout, no edema, no cyanosis no clubbing SKIN:  No rashes no nodules NEURO:  Cranial nerves II through XII grossly intact, motor grossly intact throughout PSYCH:   Cognitively intact, oriented to person place and time  ASSESSMENT AND PLAN

## 2011-01-15 NOTE — Assessment & Plan Note (Signed)
I review her lipids and they are excellent. I will continue the meds as listed.

## 2011-01-15 NOTE — Assessment & Plan Note (Signed)
Echo last year actually demonstrated some mild mitral regurgitation but no significant prolapse. This will be followed clinically. No further imaging or change in therapy is indicated.

## 2011-01-15 NOTE — Patient Instructions (Signed)
Continue current medications Follow up in 1 year with Dr Hochrein 

## 2011-01-15 NOTE — Assessment & Plan Note (Signed)
Her blood pressures fluctuate. However, for the most part they are controlled and I will continue the meds as listed.

## 2011-01-19 NOTE — Assessment & Plan Note (Signed)
[facility]                         GASTROENTEROLOGY OFFICE NOTE   Chelsea Jordan, Chelsea Jordan                        MRN:          [id number]  DATE:09/20/2007                            DOB:          Apr 24, 1927    CHIEF COMPLAINT:  Follow up of modified barium swallow results.   The modified barium swallow was normal.  The barium pill stopped mid-way  in her esophagus.  When I saw her before, I did not get a sense of  esophageal dysphagia, but now she is telling me there is sort of a mid-  sternal and left pharyngeal sticking point when she swallows solids.  Her cough had gone at night, but when she awakens in the morning, her  husband says she coughs for 15 to 20 minutes.  She has changed from  Actonel monthly to [facility] monthly.  Other medications are listed and  reviewed in the chart as before.  See previous medical history.   EXAMINATION:  VITAL SIGNS:  Weight 146, pulse 72, blood pressure 124/80.   Gastric emptying study was performed, and this was normal.   ASSESSMENT:  Dysphagia.   PLAN:  1. EGD.  Note, she is on diphosphonates.  Maybe those are relatives.  2. Chronic cough.  Plan for an ENT evaluation at some point.  She has      never had one.  She is wondering about the possibility of that.     Iva Boop, MD,FACG  Electronically Signed    CEG/MedQ  DD: 09/23/2007  DT: 09/23/2007  Job #: [id number]   cc:   [name], M.D.

## 2011-01-19 NOTE — Assessment & Plan Note (Signed)
[facility]                             PULMONARY OFFICE NOTE   KYNESHA, GUERIN                        MRN:          [id number]  DATE:04/04/2007                            DOB:          09-29-1926    HISTORY OF PRESENT ILLNESS:  Patient is a pleasant 77 year old female  who I have been asked to see for an abnormal x-ray.  The patient has  been doing very, very well from a pulmonary standpoint except for a low  level cough that she has had for years and considers her baseline.  Patient states approximately 2-1/2 weeks ago the cough began to get more  severe but it continued to be nonproductive.  Patient took Mucinex and  she began to produce pale yellow mucus.  She has also started blowing  purulent mucus from her nares.  Patient was treated with Levaquin 750  one daily for 7 days and feels that she is doing much, much better.  Overall she feels that she is greater than 75% better and is no longer  having any shortness of breath.  The patient did have a chest x-ray done  that showed a right mid lung zone infiltrate but it was unclear whether  this was related to a rib or possibly could be a lung nodule.  Patient  comes in today for further followup.   PAST MEDICAL HISTORY:  1. Hypertension.  2. Dyslipidemia.  3. History of squamous cell carcinoma of the lip.  4. Status post appendectomy and hysterectomy as well as      cholecystectomy.   CURRENT MEDICATIONS:  1. Nexium 40 mg b.i.d.  2. Allegra 180 mg daily.  3. Rhinocort 1 spray daily.  4. Fosamax 70 mg one q.week.  5. Aspirin 81 mg daily.  6. Caltrate daily.  7. Diovan 160/12.5 daily.  8. Vytorin 10/20 nightly.   PATIENT HAS AN INTOLERANCE TO ADHESIVE TAPE AS WELL AS CODEINE AND  PERCOCET.   SOCIAL HISTORY:  She has never smoked.  She is married.  She does not  work outside the home and lives with her husband.   FAMILY HISTORY:  Remarkable for father having had emphysema as well as  heart disease.  Her sister has had breast cancer.   REVIEW OF SYSTEMS:  As per history of present illness, also see patient  intake form documented on the chart.   PHYSICAL EXAMINATION:  GENERAL:  She is a well-developed female in no  acute distress.  Blood pressure is 148/82, pulse 78, temperature is  98.3, weight is 142.6 pounds, O2 saturation on room air is 98%.  HEENT:  Pupils equal, round and reactive to light and accommodation.  Extraocular muscles are intact.  Nares are patent without discharge.  Oropharynx is clear.  There is clearly mucus/liquid material running  down the back of her throat on exam.  CHEST:  Totally clear.  CARDIO:  Reveals regular rate and rhythm, no murmurs, rubs or gallops.  ABDOMEN:  Soft, nontender, good bowel sounds.  Genital, rectal and breast exam was not done and not indicated.  LOWER EXTREMITIES:  With trace edema, pulses are intact distally.  Mild  varicosities are noted.  NEUROLOGIC:  She is alert and oriented with no obvious motor deficits.   IMPRESSION:  1. Probable recent sinobronchitis.  The patient is greatly improved      after a round of antibiotics and is returning back to her usual      status.  She has very little persistent pulmonary symptoms.  2. Abnormal density on the right that appears more consistent with an      old rib fracture with callus formation than a true nodule or mass.      I will do lordotic and lateral rib views to verify this.   PLAN:  1. No change in current management.  2. Check lordotic and lateral rib views.  The patient will follow up      on a p.r.n. basis if her chest x-rays are considered normal and      this abnormal density represents a bone abnormality.     Barbaraann Share, MD,FCCP  Electronically Signed    K[facility]/MedQ  DD: 05/03/2007  DT: 05/04/2007  Job #: [id number]   cc:   Prompt[facility]

## 2011-01-19 NOTE — Consult Note (Signed)
NAME:  Chelsea Jordan, Chelsea Jordan NO.:  [id number]   MEDICAL RECORD NO.:  [id number]          PATIENT TYPE:  INP   LOCATION:  1401                         FACILITY:  [facility]   PHYSICIAN:  Casimiro Needle B. Sherene Sires, MD, FCCPDATE OF BIRTH:  Jan 04, 1927   DATE OF CONSULTATION:  01/18/2009  DATE OF DISCHARGE:  01/19/2009                                 CONSULTATION   REQUESTED BY:  [name], M.D.   CHIEF COMPLAINT:  Cough.   HISTORY:  This is a delightful 77 year old white female who states she  has never smoked but has been coughing for as long as she can remember  but at least 30 years.  The cough has waxed and waned and been more of  a problem during the day than it is while sleeping.  She says that  within a few minutes of stirring around in the morning she begins to  have the cough and then it seems to be worse immediately before she lies  down.  Once she is asleep, the cough goes away.  This pattern has been  present for over 30 years, does not have a seasonal variation, did seem  somewhat better when she was taking Zegerid at bedtime, and for that  reason has undergone an extensive evaluation by [name] including an  upper endoscopy on September 29, 2007 suggesting a dysfunctional  esophagus, and a gastric emptying study performed July 26, 2007  indicating normal emptying.   The history the patient gives is one of coughing so hard that she vomits  but typically does not produce much mucus otherwise.  She denies any  classic itching, sneezing or wheezing or previous response to  decongestants, antihistamines, steroids, or bronchodilators.  She denies  any typical seasonal variation or significant dyspnea except when she is  having severe coughing fits.  She also denies any difficulty swallowing  now, unintended weight loss, active sinus complaints, fevers, chills,  sweats, orthopnea, PND or leg swelling or chest pain of any kind.   PAST MEDICAL HISTORY:  1.  Hypertension, not on ACE inhibitors.  2. Hyperlipidemia.  3. Squamous cell carcinoma of the lip.  4. Status post appendectomy and hysterectomy.  5. Osteoporosis on bisphosphonates.   MEDICATIONS:  Include bisphosphonates and fish oil as well as ARB  therapy and Nexium twice daily.   ALLERGIES:  Intolerance there is ADHESIVE TAPE, CODEINE and PERCOCET.   SOCIAL HISTORY:  She has never smoked.  She is married.  She does not  work outside the home has no unusual occupational history.   FAMILY HISTORY:  Positive for emphysema in her father and heart disease,  her sister has breast cancer.   REVIEW OF SYSTEMS:  Taken in detail and essentially negative except as  outlined above.   PHYSICAL EXAMINATION:  This is a minimally chronically ill-appearing  ambulatory white female in no acute distress.  She is afebrile with  normal vital signs.  HEENT:  Unremarkable.  The pharynx is clear.  Nasal turbinates are  normal.  NECK:  Supple without cervical adenopathy or tenderness.  Trachea  is  midline, no thyromegaly.  There is a regular rate and rhythm without  murmur, gallop or rub.  LUNGS:  Fields are clear bilaterally on auscultation and percussion with  no cough elicited on inspiratory or expiratory maneuvers.  ABDOMEN:  Soft, benign, with normal excursion in the supine position.  EXTREMITIES:  Warm, without calf tenderness, cyanosis, clubbing or  edema.   LABORATORY STUDIES:  Include a chest x-ray showing old rib deformity on  the right posteriorly with minimal COPD changes.  CT of the abdomen and  pelvis is unremarkable.  No CT scan of the chest or sinuses is  available.  She did however have a CT scan of the chest dated May 04, 2007 which I was able to review, indicating no evidence of emphysema,  bronchiectasis or significant adenopathy or nodularity.   IMPRESSION:  Chronic cough dating back over 30 years that is present  during the day and never at night is almost certainly a  form of a  cyclical cough.  The fact that she coughs so hard she vomits is strongly  indicative of reflux disease, acid or otherwise.  This is potentially  worsened by the use of fish oil and bisphosphonates, which I have asked  her to eliminate from her diet in the short run and continue to treat  the acid component of acid reflux aggressively for 4-6 weeks before  returning to the office to take the next step.   I explained to the patient in detail along with her husband the concept  of a 14-step algorithm per present chest guidelines that is intended to  eradicate the cough, with each step needing to be followed to the letter  before taking additional steps.   If she does not respond to aggressive acid suppression, the next step  would be a 6-week trial of Reglan, understanding that there is a risk,  albeit quite small, of adverse effects from short term Reglan therapy.  The other option might be to consider sinus CT scan and methacholine  challenge test as well as a trial of first-generation antihistamine (the  anticholinergic effects of chlorpheniramine appear to work better to  eliminate the postnasal drip syndrome from the differential then Allegra  or Claritin, although she says these have helped somewhat in the past  but have not eradicated the cough completely).      Charlaine Dalton. Sherene Sires, MD, [facility]  Electronically Signed     MBW/MEDQ  D:  01/19/2009  T:  01/20/2009  Job:  [id number]   cc:   [name], M.D.  Fax: [phone]

## 2011-01-19 NOTE — H&P (Signed)
NAME:  Chelsea Jordan, Chelsea Jordan NO.:  [id number]   MEDICAL RECORD NO.:  [id number]          PATIENT TYPE:  INP   LOCATION:                               FACILITY:  [facility]   PHYSICIAN:  Vania Rea, M.D. DATE OF BIRTH:  11-01-1926   DATE OF ADMISSION:  01/17/2009  DATE OF DISCHARGE:  01/19/2009                              HISTORY & PHYSICAL   PRIMARY CARE PHYSICIAN:  [name], M.D.   UROLOGIST:  Maretta Bees. Vonita Moss, M.D., [facility].   PULMONOLOGIST:  Barbaraann Share, MD.   CARDIOLOGIST:  Colleen Can. Deborah Chalk, M.D.   GASTROENTEROLOGIST:  Iva Boop, MD with [facility].   CHIEF COMPLAINT:  Urinary tract infection.   HISTORY OF PRESENT ILLNESS:  This is a 77 year old Caucasian lady with a  history of multiple medical problems including chronic cough, chronic  allergies, allergic rhinitis, bronchitis, GERD, chronic cystitis, who  was seen at her primary physician's office about 2 weeks ago and treated  for acute bronchitis with steroid injections and Amoxil.  Subsequently  says she improved, but has been having exacerbation of her cough,  persistent nausea, episodic vomiting and about 3 days ago developed  dysuria.  The patient had some trimethoprim left over from her  urologist, Dr. Vonita Moss and she started herself on trimethoprim, and  then went into see her primary care physician.  Her primary physician  did urine cultures and started her on oral Septra, but since then her  nausea and vomiting has gotten worse.  Although the urine cultures were  reported as negative, she continues to feel sick.  She was seen in her  doctor's office today and was noted to be hypotensive, and she also  complained of chills.  She was diagnosed with pyelonephritis and advised  to come to [facility] for further evaluation.  The  patient has been in the emergency room since this morning receiving IV  fluids for hypotension.  She has continued to have  persistent cough.  Her blood pressures have gradually risen from 90/51 when initially seen  about 10 hours ago to more recently 185/79.  She has had one elevated  temperature reading while in the emergency room about 100.1.  She  continues to be nauseous.   Her cough is productive occasionally of clear sputum, but is most often  dry.  She denies any active postnasal drip or any facial pain or  tenderness.  She denies any difficulty breathing.  The dysuria has  resolved.  She is having no frequency.  She does feel weak and generally  sick.   PAST MEDICAL HISTORY:  1. Allergic rhinitis.  2. Multiple allergies.  3. Bronchitis, diagnosed April 2010.  4. Sinobronchitis 2008.  5. GERD.  6. Hyperlipidemia.  7. Hypertension.  8. Valvular heart disease, unclear which valve.  9. History of weak heart muscle.  10.__________  11.History of stable meningioma.  12.Osteoporosis.   MEDICATIONS:  1. Actonel 150 mg monthly.  2. Vitamin D 1 tablet 1 capsule daily.  3. Calcium 500 mg twice daily.  4. Aspirin  81 mg daily.  5. Cipro 500 mg every 12 hours.  6. Diovan HCT 160/12.5 daily.  She has been taking this for over 6      years.  7. Allegra 180 mg daily.  8. Fish oil capsules 4 capsules daily.  9. Multivitamins 1 daily.  10.Nexium 49 mg twice daily.  11.Rhinocort Aqua 2 sprays each nostril once or twice daily.  12.Simvastatin 5 mg at bedtime.   ALLERGIES:  1. CODEINE causes nausea and paresthesia.  2. LEVAQUIN causes nausea, diarrhea, rash.  3. PERCOCET causes nausea and vomiting.  4. LIPITOR causes numbness and paresthesias of the legs.  5. LATEX causes a rash.   SOCIAL HISTORY:  There is no history of tobacco, alcohol or illicit drug  use.  She is a retired Landscape architect.  Lives with her husband.   FAMILY HISTORY:  Significant for mother who had myocardial infarction in  her 75's, but lived to be 22.  Brother who had a myocardial infarction  in his 75s, but is still alive.  A twin  sister who recently had a  pacemaker placed, I am unsure for what reason.  Father who was a smoker  and had emphysema.  Review of systems other than noted above a 10-point  review of systems was unremarkable.  She denies hematuria.   PHYSICAL EXAMINATION:  GENERAL:  Elderly, small-built Caucasian lady  lying on the stretcher coughing frequently, but otherwise not  distressed.  VITAL SIGNS:  T-max is 100.1, currently 98.1, pulse is 76, respirations  18, blood pressure 155/79.  She is saturating 99% on 2 liters.  HEENT:  Her pupils are round and equal.  Mucous membranes pink,  anicteric.  She is moderately dehydrated.  NECK:  No cervical adenopathy or thyromegaly.  No jugular venous  distention.  CHEST:  She has marked kyphosis, but her chest is clear to auscultation  without crackles, wheezes or rhonchi.  CARDIOVASCULAR:  Regular rhythm.  No murmur heard.  ABDOMEN:  Soft, nontender, no masses.  EXTREMITIES:  Without edema.  Toes are warm.  Dorsalis pedis pulses were  3+ bilaterally and bounding.  CENTRAL NERVOUS SYSTEM:  Cranial nerves II-XII are grossly intact and  she has no focal neurologic deficit.   LABORATORY DATA:  White count is 10.0, hemoglobin 14.8, MCV 91,  platelets 199, 92% neutrophils and absolute granulocyte count is 9.2.  Differential is otherwise unremarkable.  Her chemistry is significant  for sodium 128, potassium 3.3, chloride 96, BUN 22, creatinine 0.9,  glucose 107.  Urinalysis was cloudy urine with a specific gravity of  1.023, negative for hemoglobin or glucose, 100 mg/dL of proteins, 15  mg/dL of ketones, positive nitrites and trace leukocyte esterase.  Microscopy shows 3-6 white cells, otherwise unremarkable.   DIAGNOSTICS:  1. Two-view chest x-ray showed no acute abnormality.  2. CT scan of abdomen without contrast showed a couple of small renal      calculi bilaterally.  No overt hydronephrosis.  No ureteral      calculi.  No urethral dilation.  In fact,  acute abdominal findings.   ASSESSMENT:  1. Urinary tract infection.  2. Bilateral renal calculi.  3. Possibly acute gastritis related to the Septra use.  4. Dehydration as evidenced by the elevated BUN and creatinine ratio.  5. Hyponatremia and hypokalemia related to dehydration  6. Chronic cough possibly related to ARB/ACE inhibitor use.  7. History of chronic cystitis.  8. History of gastroesophageal reflux disease, multiple managers.  9. History  of osteoporosis.  10.History of alkaline phosphatase.   PLAN:  We will bring this lady in for hydration and repletion of her  electrolytes.  We will  continue to treat her with intravenous Rocephin.  We will give antiemetics and we will re-culture her urine since it is  showing evidence of  infection.  We will discontinue ARBs and monitor her for resolution of  the cough, but we will continue her antihistamines.  We will get a 2-D  echo since we have no echo recorded in our system and physical exam does  not reveal any evidence of valvular heart disease.  Other plans as per  orders.      Vania Rea, M.D.  Electronically Signed     LC/MEDQ  D:  01/17/2009  T:  01/18/2009  Job:  [id number]   cc:   Iva Boop, MD,FACG  [facility]  [address] [place], [place] [address]   Colleen Can. Deborah Chalk, M.D.  Fax: [phone]   Barbaraann Share, MD,FCCP  [address]  [place]  [place] [address]   [name], M.D.  Fax: [phone]   Maretta Bees. Vonita Moss, M.D.  Fax: [phone]

## 2011-01-19 NOTE — Assessment & Plan Note (Signed)
[facility]                         GASTROENTEROLOGY OFFICE NOTE   CLARABELLE, OSCARSON                        MRN:          [id number]  DATE:07/21/2007                            DOB:          03/12/1927    CHIEF COMPLAINT:  Reflux refills.   Ms. Desaulniers continues to have a cough.  She is on Nexium 40 mg b.i.d.  She recently had some recurrent sinobronchitis improved with antibiotics  over the summer.  She had been better, I thought, in March 2007.  It is  really hard to sort out how badly she is doing at this time but she is  sitting here coughing quite a bit.   MEDICATIONS:  Listed and reviewed in the chart.   VITAL SIGNS:  Height 5 feet 4 inches, weight 142 pounds, pulse 78, blood  pressure 136/76.   Note, she says when she swallows sometimes food goes down the wrong way  with the first bite.   ASSESSMENT:  Gastroesophageal reflux disease is known.  She does not  have significant heartburn at this time.  She may have a little bit of  aspiration.  There is no esophageal dysphagia.   PLAN:  I am going to work her up further with a gastric-emptying study  and possible upper GI series or modified barium swallow.  We now have  the option of Bravo pH testing which may be useful.  This is a difficult  problem that is likely multifactorial.  I do wonder if she is having  tachyphylaxis to Nexium and that is something to consider.  Further  plans pending these results.     Iva Boop, MD,FACG  Electronically Signed    CEG/MedQ  DD: 07/21/2007  DT: 07/21/2007  Job #: [id number]

## 2011-04-08 ENCOUNTER — Encounter: Payer: Self-pay | Admitting: Internal Medicine

## 2011-04-14 ENCOUNTER — Other Ambulatory Visit: Payer: Self-pay | Admitting: *Deleted

## 2011-04-14 MED ORDER — LOSARTAN POTASSIUM 50 MG PO TABS: 50.0000 mg | ORAL_TABLET | Freq: Every day | ORAL | Status: DC

## 2011-04-17 ENCOUNTER — Other Ambulatory Visit: Payer: Self-pay | Admitting: Internal Medicine

## 2011-04-19 ENCOUNTER — Telehealth: Payer: Self-pay | Admitting: Cardiology

## 2011-04-19 NOTE — Telephone Encounter (Signed)
PT NEEDS LOSARTIN TO BE CALL IN TO [facility]/# [phone]

## 2011-04-20 MED ORDER — LOSARTAN POTASSIUM 50 MG PO TABS: 50.0000 mg | ORAL_TABLET | Freq: Every day | ORAL | Status: DC

## 2011-04-21 ENCOUNTER — Telehealth: Payer: Self-pay | Admitting: Cardiology

## 2011-04-21 MED ORDER — LOSARTAN POTASSIUM 50 MG PO TABS: 50.0000 mg | ORAL_TABLET | Freq: Every day | ORAL | Status: DC

## 2011-04-21 NOTE — Telephone Encounter (Signed)
Pharmacist needs the okay to increase pt losartan to 90 days supply.

## 2011-05-10 ENCOUNTER — Other Ambulatory Visit: Payer: Self-pay | Admitting: Internal Medicine

## 2011-06-07 ENCOUNTER — Other Ambulatory Visit (INDEPENDENT_AMBULATORY_CARE_PROVIDER_SITE_OTHER): Payer: Medicare Other

## 2011-06-07 ENCOUNTER — Encounter: Payer: Self-pay | Admitting: Internal Medicine

## 2011-06-07 ENCOUNTER — Ambulatory Visit (INDEPENDENT_AMBULATORY_CARE_PROVIDER_SITE_OTHER): Payer: Medicare Other | Admitting: Internal Medicine

## 2011-06-07 VITALS — BP 150/82 | HR 66 | Temp 97.9°F | Ht 62.0 in | Wt 132.4 lb

## 2011-06-07 DIAGNOSIS — E785 Hyperlipidemia, unspecified: Secondary | ICD-10-CM

## 2011-06-07 DIAGNOSIS — Z23 Encounter for immunization: Secondary | ICD-10-CM

## 2011-06-07 DIAGNOSIS — M810 Age-related osteoporosis without current pathological fracture: Secondary | ICD-10-CM

## 2011-06-07 DIAGNOSIS — I10 Essential (primary) hypertension: Secondary | ICD-10-CM

## 2011-06-07 DIAGNOSIS — Z0000 Encounter for general adult medical examination without abnormal findings: Secondary | ICD-10-CM

## 2011-06-07 DIAGNOSIS — Z136 Encounter for screening for cardiovascular disorders: Secondary | ICD-10-CM

## 2011-06-07 LAB — BASIC METABOLIC PANEL
Chloride: 104 mEq/L (ref 96–112)
Potassium: 4.1 mEq/L (ref 3.5–5.1)
Sodium: 140 mEq/L (ref 135–145)

## 2011-06-07 LAB — PHOSPHORUS: Phosphorus: 3.6 mg/dL (ref 2.3–4.6)

## 2011-06-07 LAB — LIPID PANEL
LDL Cholesterol: 96 mg/dL (ref 0–99)
Total CHOL/HDL Ratio: 2

## 2011-06-07 NOTE — Assessment & Plan Note (Signed)
Intol of oral bisphos - prev on fosamax for >15 years -  Given reclast 07/2009 but declined 2nd treatment 07/2010 Last dexa 06/13/09 reviewed: -3.0 at L fem hx rib fx and pelvic fx 2008 - pt concerned about brittle bone causing fx and skipped 2nd reclast in 07/2010 - will sched f/u dexa after 06/14/11

## 2011-06-07 NOTE — Progress Notes (Signed)
Subjective:    Patient ID: Chelsea Jordan, female    DOB: 10-Jun-1927, 77 y.o.   MRN: [id number]  HPI  Here for medicare wellness  Diet: heart healthy Physical activity: sedentary Depression/mood screen: negative Hearing: intact to whispered voice Visual acuity: grossly normal but known macular degeneration, performs annual eye exam  ADLs: capable Fall risk: none Home safety: good Cognitive evaluation: intact to orientation, naming, recall and repetition EOL planning: adv directives, full code/ I agree  I have personally reviewed and have noted 1. The patient's medical and social history 2. Their use of alcohol, tobacco or illicit drugs 3. Their current medications and supplements 4. The patient's functional ability including ADL's, fall risks, home safety risks and hearing or visual impairment. 5. Diet and physical activities 6. Evidence for depression or mood disorders   Also reviewed chronic medical issues:  asthma/reactiv airway dz - follows with our pulm for same - Wert reports compliance with ongoing medical treatment and no changes in medication dose or frequency. denies adverse side effects related to current therapy. no change in breathing with season changes  dyslipidemia - reports compliance with ongoing medical treatment and no changes in medication dose or frequency. denies adverse side effects related to current therapy. no GI or muscle problems reported  HTN - reports compliance with ongoing medical treatment and no changes in medication dose or frequency. denies adverse side effects related to current therapy.  variable readings at home with SBP range 110-150s - has seen cards for same -   osteoporosis - reports compliance with ongoing medical treatment and no changes in medication dose or frequency. denies adverse side effects related to current therapy. reclast annually begun 07/2009 as intol to oral bisphos -  but took fosamax >15years prior - hx rib fx and  pelvic fx 2008 - pt concerned about brittle bone causing fx and skipped 2nd reclast in 07/2010 - will sched f/u dexa after 06/14/11  Past Medical History  Diagnosis Date  . DYSLIPIDEMIA   . HYPERTENSION   . MITRAL VALVE PROLAPSE   . NEPHROLITHIASIS   . OSTEOARTHRITIS   . OSTEOPOROSIS   . COLONIC POLYPS, HX OF    Family History  Problem Relation Age of Onset  . Heart disease Mother   . Heart disease Father   . Breast cancer Sister   . Heart disease Other    History  Substance Use Topics  . Smoking status: Never Smoker   . Smokeless tobacco: Not on file   Comment: Married, lives with spouse, Enjoys golf. Retired  . Alcohol Use: Yes     Wine     Review of Systems  Constitutional: Positive for fatigue. Negative for fever and unexpected weight change.  Respiratory: Negative for cough.   Cardiovascular: Negative for chest pain.  Genitourinary: Negative for dysuria.  No other specific complaints in a complete review of systems (except as listed in HPI above).     Objective:   Physical Exam  BP 150/82  Pulse 66  Temp(Src) 97.9 F (36.6 C) (Oral)  Ht 5\' 2"  (1.575 m)  Wt 132 lb 6.4 oz (60.056 kg)  BMI 24.22 kg/m2  SpO2 97% Wt Readings from Last 3 Encounters:  06/07/11 132 lb 6.4 oz (60.056 kg)  01/15/11 136 lb (61.689 kg)  12/07/10 134 lb 6.4 oz (60.963 kg)   Constitutional: She is well-developed and well-nourished. No distress.  Eyes: Conjunctivae and EOM are normal. Pupils are equal, round, and reactive to light. No scleral icterus.  Neck: Normal range of motion. Neck supple. No JVD present. No thyromegaly present.  Cardiovascular: Normal rate, regular rhythm and normal heart sounds.  No murmur heard. no BLE edema Pulmonary/Chest: Effort normal and breath sounds normal. No respiratory distress. She has no wheezes.  Abd: SNTND, +BS, no masses Neurological: She is alert and oriented to person, place, and time. No cranial nerve deficit. Coordination normal.  Skin: Skin  is warm and dry. No rash noted. No erythema.  Psychiatric: She has a normal mood and affect. Her behavior is normal. Judgment and thought content normal.   Lab Results  Component Value Date   WBC 7.5 06/05/2010   HGB 14.1 06/05/2010   HCT 41.0 06/05/2010   PLT 197.0 06/05/2010   CHOL 151 12/07/2010   TRIG 71.0 12/07/2010   HDL 62.50 12/07/2010   ALT 23 12/07/2010   AST 21 12/07/2010   NA 140 07/15/2009   K 3.9 07/15/2009   CL 102 07/15/2009   CREATININE 0.5 07/15/2009   BUN 19 07/15/2009   CO2 30 07/15/2009   TSH 2.47 06/05/2010   EKG: NSR @63bpm  - no sp ST-T changes, no change from 11/2009     Assessment & Plan:  AWV - v70.0 - Today patient counseled on age appropriate routine health concerns for screening and prevention, each reviewed and up to date or declined. Immunizations reviewed and up to date or declined. Labs/ECG reviewed. Risk factors for depression reviewed and negative. Hearing function and visual acuity are intact. ADLs screened and addressed as needed. Functional ability and level of safety reviewed and appropriate. Education, counseling and referrals performed based on assessed risks today. Patient provided with a copy of personalized plan for preventive services.  Also see problem list. Medications and labs reviewed today.

## 2011-06-07 NOTE — Assessment & Plan Note (Signed)
The current medical regimen is usually effective (component of known white coat HTN);  continue present plan and medications.  BP Readings from Last 3 Encounters:  06/07/11 150/82  01/15/11 158/92  12/07/10 140/78   Lab Results  Component Value Date   CREATININE 0.5 07/15/2009

## 2011-06-07 NOTE — Patient Instructions (Signed)
It was good to see you today. Test(s) ordered today. Your results will be called to you and mailed to you after review (48-72hours after test completion). If any changes need to be made, you will be notified at that time. We have reviewed your prior records including labs and tests today Medications reviewed, no changes at this time. Will order repeat bone density scan after next week - will call you to notify you of test Please schedule followup in 6 months, call sooner if problems.

## 2011-06-07 NOTE — Assessment & Plan Note (Signed)
The current medical regimen is effective (statin + fish oil);  continue present plan and medications.  Check labs now

## 2011-06-14 ENCOUNTER — Other Ambulatory Visit: Payer: Self-pay | Admitting: Internal Medicine

## 2011-06-14 DIAGNOSIS — M810 Age-related osteoporosis without current pathological fracture: Secondary | ICD-10-CM

## 2011-06-21 ENCOUNTER — Other Ambulatory Visit: Payer: Medicare Other

## 2011-06-22 ENCOUNTER — Ambulatory Visit (INDEPENDENT_AMBULATORY_CARE_PROVIDER_SITE_OTHER)
Admission: RE | Admit: 2011-06-22 | Discharge: 2011-06-22 | Disposition: A | Discharge status: Home / Self Care | Payer: Medicare Other | Source: Ambulatory Visit | Attending: Internal Medicine | Admitting: Internal Medicine

## 2011-06-22 DIAGNOSIS — M810 Age-related osteoporosis without current pathological fracture: Secondary | ICD-10-CM

## 2011-07-01 ENCOUNTER — Encounter: Payer: Self-pay | Admitting: Internal Medicine

## 2011-07-13 ENCOUNTER — Other Ambulatory Visit: Payer: Self-pay | Admitting: Internal Medicine

## 2011-07-13 DIAGNOSIS — Z1231 Encounter for screening mammogram for malignant neoplasm of breast: Secondary | ICD-10-CM

## 2011-07-20 ENCOUNTER — Telehealth: Payer: Self-pay | Admitting: *Deleted

## 2011-07-20 NOTE — Telephone Encounter (Signed)
Hospital short stay is now also on Epic/EMR, so the process to order reclast treatment has changed.   Let me look into this process and we will get back to patient on same. Thanks.

## 2011-07-20 NOTE — Telephone Encounter (Signed)
Pt left msg on vm md wanted her to have reclast infusion done husband is now out of the hospital. Can set up infusion. Can't do on Dec 5th...07/20/11@11 :22am/LMB

## 2011-07-20 NOTE — Telephone Encounter (Signed)
Notified pt with md response...07/20/11@3 :56pm/LMB

## 2011-07-21 ENCOUNTER — Other Ambulatory Visit: Payer: Self-pay | Admitting: Internal Medicine

## 2011-07-22 ENCOUNTER — Telehealth: Payer: Self-pay | Admitting: *Deleted

## 2011-07-22 ENCOUNTER — Other Ambulatory Visit (HOSPITAL_COMMUNITY): Payer: Self-pay | Admitting: *Deleted

## 2011-07-22 DIAGNOSIS — I10 Essential (primary) hypertension: Secondary | ICD-10-CM

## 2011-07-22 DIAGNOSIS — M810 Age-related osteoporosis without current pathological fracture: Secondary | ICD-10-CM

## 2011-07-22 DIAGNOSIS — Z79899 Other long term (current) drug therapy: Secondary | ICD-10-CM

## 2011-07-22 NOTE — Telephone Encounter (Signed)
Ok to order Bmet, v58.69 or 733.01 or 401.9 on day of Reclast infusion - thanks

## 2011-07-22 NOTE — Telephone Encounter (Signed)
Received order for infusion. Labs are out of date, must be done 30 days prior to getting infusion. Can draw the same day, but will need order....07/22/11@ 2:07pm/LMB

## 2011-07-22 NOTE — Telephone Encounter (Signed)
Notified short stay spoke with [name] entered Bmet in epic but she is not able to see order. [name] is faxing original order back will add bmet on order and fax back to short stay. Pt will have lab done prior to infusion....07/21/1222:18pm/LMB

## 2011-07-26 ENCOUNTER — Encounter: Payer: Self-pay | Admitting: Internal Medicine

## 2011-07-26 ENCOUNTER — Ambulatory Visit (INDEPENDENT_AMBULATORY_CARE_PROVIDER_SITE_OTHER): Payer: Medicare Other | Admitting: Internal Medicine

## 2011-07-26 VITALS — BP 130/80 | HR 73 | Temp 98.0°F

## 2011-07-26 DIAGNOSIS — M810 Age-related osteoporosis without current pathological fracture: Secondary | ICD-10-CM

## 2011-07-26 DIAGNOSIS — H6120 Impacted cerumen, unspecified ear: Secondary | ICD-10-CM

## 2011-07-26 DIAGNOSIS — M26629 Arthralgia of temporomandibular joint, unspecified side: Secondary | ICD-10-CM

## 2011-07-26 NOTE — Patient Instructions (Signed)
It was good to see you today. Suspect your pain is coming from [facility] arthritis flare> use Tylenol arthritis twice a day for the next week to help with the symptoms If continued jaw pain symptoms, followup with your dentist as discussed Will hold on request until jaw symptoms have resolved, reconsider spring 2013 Your ear has been irrigated of wax today

## 2011-07-26 NOTE — Progress Notes (Signed)
  Subjective:    Patient ID: Chelsea Jordan, female    DOB: 12/04/1926, 77 y.o.   MRN: [id number]  HPI complains of jaw pain, R side Onset 2 weeks ago +clenching teeth at night (stress with spouse illness) Also R ear ache - ?wax - no swelling of ear  Advised by short stay for eval prior to Reclast infusion (scheduled dose on hold)   Past Medical History  Diagnosis Date  . DYSLIPIDEMIA   . HYPERTENSION   . MITRAL VALVE PROLAPSE   . NEPHROLITHIASIS   . OSTEOARTHRITIS   . OSTEOPOROSIS   . COLONIC POLYPS, HX OF     Review of Systems  Constitutional: Negative for fever and fatigue.  HENT: Negative for ear pain, nosebleeds, facial swelling and neck pain.   Respiratory: Negative for cough, shortness of breath and wheezing.   Cardiovascular: Negative for chest pain and palpitations.       Objective:   Physical Exam BP 130/80  Pulse 73  Temp(Src) 98 F (36.7 C) (Oral)  SpO2 98% Wt Readings from Last 3 Encounters:  06/07/11 132 lb 6.4 oz (60.056 kg)  01/15/11 136 lb (61.689 kg)  12/07/10 134 lb 6.4 oz (60.963 kg)   Constitutional: She appears well-developed and well-nourished. No distress.  HENT: Head: Normocephalic and atraumatic. Ears: L TM ok, no erythema or effusion; R TM occluded by cerumen; Nose: Nose normal. Mouth/Throat: +TMJ click with open/close - teeth intact without gum problems. Oropharynx is clear and moist. No oropharyngeal exudate. Nontender to palpation of jaw Eyes: Conjunctivae and EOM are normal. Pupils are equal, round, and reactive to light. No scleral icterus.  Neck: Normal range of motion. Neck supple. No JVD present. No thyromegaly present.  Cardiovascular: Normal rate, regular rhythm and normal heart sounds.  No murmur heard. No BLE edema. Pulmonary/Chest: Effort normal and breath sounds normal. No respiratory distress. She has no wheezes.Skin: Skin is warm and dry. No rash noted. No erythema.  Psychiatric: She has a normal mood and affect. Her behavior is  normal. Judgment and thought content normal.   Lab Results  Component Value Date   WBC 7.5 06/05/2010   HGB 14.1 06/05/2010   HCT 41.0 06/05/2010   PLT 197.0 06/05/2010   GLUCOSE 103* 06/07/2011   CHOL 199 06/07/2011   TRIG 55.0 06/07/2011   HDL 92.50 06/07/2011   LDLCALC 96 06/07/2011   ALT 23 12/07/2010   AST 21 12/07/2010   NA 140 06/07/2011   K 4.1 06/07/2011   CL 104 06/07/2011   CREATININE 0.5 06/07/2011   BUN 20 06/07/2011   CO2 29 06/07/2011   TSH 2.47 06/05/2010   Procedure: wax removal Reason: wax impaction Risks and benefits of procedure discussed with the patient who agrees to proceed. Ear(s) irrigated with warm water. Large amount of wax removed. Instrumentation with metal ear loop was performed to accomplish wax removal. the patient tolerated procedure well.     Assessment & Plan:  Jaw pain, right - suspect TMJ flare due to recent stress (spouse ill/hosp/declining health) -  also R cerumen impaction - see above  Will hold Reclast at this time though no evidence for osteonecrosis- If pain resolves, ok to proceed in next 2-3 months - if persisting pain after irrigation and tylenol, advised follow up with dentist (appointment scheduled for Feb 2013)

## 2011-07-26 NOTE — Assessment & Plan Note (Signed)
Intol of oral bisphos - prev on fosamax for >15 years -  Given reclast 07/2009  dexa 06/13/09 reviewed: -3.0 at L fem hx rib fx and pelvic fx 2008 - pt concerned about brittle bone causing fx - thus skipped 2nd reclast in 07/2010 -   f/u dexa 06/2011 with progressive disease> second request is scheduled for November 2012 but now on hold due to complaints of jaw pain As above, no evidence for osteonecrosis but will hold until resolution of symptoms and followup with dentist February 2013

## 2011-08-10 ENCOUNTER — Ambulatory Visit (HOSPITAL_COMMUNITY)
Admission: RE | Admit: 2011-08-10 | Discharge: 2011-08-10 | Disposition: A | Discharge status: Home / Self Care | Payer: Medicare Other | Source: Ambulatory Visit | Attending: Internal Medicine | Admitting: Internal Medicine

## 2011-08-10 DIAGNOSIS — Z1231 Encounter for screening mammogram for malignant neoplasm of breast: Secondary | ICD-10-CM | POA: Insufficient documentation

## 2011-09-23 DIAGNOSIS — H3560 Retinal hemorrhage, unspecified eye: Secondary | ICD-10-CM | POA: Diagnosis not present

## 2011-09-23 DIAGNOSIS — H34829 Venous engorgement, unspecified eye: Secondary | ICD-10-CM | POA: Diagnosis not present

## 2011-09-23 DIAGNOSIS — H47029 Hemorrhage in optic nerve sheath, unspecified eye: Secondary | ICD-10-CM | POA: Diagnosis not present

## 2011-09-23 DIAGNOSIS — H353190 Nonexudative age-related macular degeneration, unspecified eye, stage unspecified: Secondary | ICD-10-CM | POA: Diagnosis not present

## 2011-10-01 ENCOUNTER — Other Ambulatory Visit: Payer: Self-pay | Admitting: Internal Medicine

## 2011-10-08 ENCOUNTER — Telehealth: Payer: Self-pay

## 2011-10-08 NOTE — Telephone Encounter (Signed)
"  chip" may explain jaw discomfort but is not complication of Reclast - I would recommended continuing Reclast for osteoporosis and work with dentist on chip and pain symptoms - thanks

## 2011-10-08 NOTE — Telephone Encounter (Signed)
Pt called to inform MD that dentist did an xray today and said aside from osteoporosis pt does have a "chip" on the hinge of her jawbone. PT is requesting MD's advisement regarding this and specifically Reclast treatment?

## 2011-10-11 NOTE — Telephone Encounter (Signed)
Pt advised via VM 

## 2011-10-14 ENCOUNTER — Telehealth: Payer: Self-pay | Admitting: Internal Medicine

## 2011-10-14 DIAGNOSIS — R350 Frequency of micturition: Secondary | ICD-10-CM | POA: Diagnosis not present

## 2011-10-14 DIAGNOSIS — N3020 Other chronic cystitis without hematuria: Secondary | ICD-10-CM | POA: Diagnosis not present

## 2011-10-29 DIAGNOSIS — H353190 Nonexudative age-related macular degeneration, unspecified eye, stage unspecified: Secondary | ICD-10-CM | POA: Diagnosis not present

## 2011-11-10 ENCOUNTER — Telehealth: Payer: Self-pay | Admitting: *Deleted

## 2011-11-10 DIAGNOSIS — M810 Age-related osteoporosis without current pathological fracture: Secondary | ICD-10-CM

## 2011-11-10 NOTE — Telephone Encounter (Signed)
Pt left msg on vm md wanted her to have infusion done haven't heard anything. Wanted to know what was best the infusion or injection concerning bone loss.... 11/10/11@5 :03pm/LMB

## 2011-11-10 NOTE — Telephone Encounter (Signed)
Both Reclast and Prolia work well for treatment of osteoporosis.  Prolia injections are easier to coordinate since these are done in the office (as opposed to short stay IV infusion at Hospital) -  I will order Prloa injection now (and every 51mo) and our office will verify coverage and contact patient once approval is obtained - please notify Ms. Chelsea Jordan of new Prolia order - thanks

## 2011-11-11 ENCOUNTER — Telehealth: Payer: Self-pay | Admitting: *Deleted

## 2011-11-11 NOTE — Telephone Encounter (Signed)
Pt notified... 11/11/11@2 :51pm/LMB

## 2011-11-11 NOTE — Telephone Encounter (Signed)
Message copied by Deatra James on Thu Nov 11, 2011  2:50 PM ------      Message from: Clover Mealy      Created: Thu Nov 11, 2011  1:06 PM       Hi Valentina Gu,            [place] faxed the ins info to [employer]; it will be 3-5 days before we hear from them.  Will keep you posted.            Ruby            ----- Message -----         From: Orlan Leavens, MA         Sent: 11/11/2011  11:57 AM           To: Crawford Givens McClinton            Hey Mrs. Ruby            Dr. Felicity Coyer want this pt to have a prolia injection (see phone note 11/10/11)

## 2011-11-11 NOTE — Telephone Encounter (Signed)
Notified pt with md response & recommendations... 11/11/11@ 2:30pm/LMB

## 2011-11-15 DIAGNOSIS — R310 Gross hematuria: Secondary | ICD-10-CM | POA: Diagnosis not present

## 2011-11-15 DIAGNOSIS — R350 Frequency of micturition: Secondary | ICD-10-CM | POA: Diagnosis not present

## 2011-11-15 DIAGNOSIS — N3020 Other chronic cystitis without hematuria: Secondary | ICD-10-CM | POA: Diagnosis not present

## 2011-11-17 ENCOUNTER — Telehealth: Payer: Self-pay | Admitting: *Deleted

## 2011-11-17 NOTE — Telephone Encounter (Signed)
Pt drop off dentist films for md to look at. Md has seen films and address. Called pt to inform her waht md stated and for her to pick films back up. Pt states will come by tomorrow and pick films back up... 11/17/11@4 :17pm/LMB

## 2011-11-18 DIAGNOSIS — N281 Cyst of kidney, acquired: Secondary | ICD-10-CM | POA: Diagnosis not present

## 2011-11-18 DIAGNOSIS — R310 Gross hematuria: Secondary | ICD-10-CM | POA: Diagnosis not present

## 2011-11-18 DIAGNOSIS — K449 Diaphragmatic hernia without obstruction or gangrene: Secondary | ICD-10-CM | POA: Diagnosis not present

## 2011-12-07 ENCOUNTER — Encounter: Payer: Self-pay | Admitting: Internal Medicine

## 2011-12-07 ENCOUNTER — Other Ambulatory Visit (INDEPENDENT_AMBULATORY_CARE_PROVIDER_SITE_OTHER): Payer: Medicare Other

## 2011-12-07 ENCOUNTER — Ambulatory Visit (INDEPENDENT_AMBULATORY_CARE_PROVIDER_SITE_OTHER): Payer: Medicare Other | Admitting: Internal Medicine

## 2011-12-07 VITALS — BP 134/88 | HR 77 | Temp 97.0°F | Resp 16 | Ht 64.0 in | Wt 133.0 lb

## 2011-12-07 DIAGNOSIS — M810 Age-related osteoporosis without current pathological fracture: Secondary | ICD-10-CM

## 2011-12-07 DIAGNOSIS — I10 Essential (primary) hypertension: Secondary | ICD-10-CM

## 2011-12-07 DIAGNOSIS — K219 Gastro-esophageal reflux disease without esophagitis: Secondary | ICD-10-CM | POA: Insufficient documentation

## 2011-12-07 DIAGNOSIS — E785 Hyperlipidemia, unspecified: Secondary | ICD-10-CM

## 2011-12-07 LAB — LIPID PANEL
Cholesterol: 191 mg/dL (ref 0–200)
HDL: 87.6 mg/dL (ref >39.00)
LDL Cholesterol: 86 mg/dL (ref 0–99)
Total CHOL/HDL Ratio: 2
Triglycerides: 89 mg/dL (ref 0.0–149.0)
VLDL: 17.8 mg/dL (ref 0.0–40.0)

## 2011-12-07 MED ORDER — ESOMEPRAZOLE MAGNESIUM 40 MG PO CPDR: 40.0000 mg | DELAYED_RELEASE_CAPSULE | Freq: Every day | ORAL | Status: DC

## 2011-12-07 MED ORDER — FAMOTIDINE 20 MG PO TABS: 20.0000 mg | ORAL_TABLET | Freq: Every evening | ORAL | Status: DC | PRN

## 2011-12-07 MED ORDER — SIMVASTATIN 5 MG PO TABS: 5.0000 mg | ORAL_TABLET | Freq: Every day | ORAL | Status: DC

## 2011-12-07 MED ORDER — DENOSUMAB 60 MG/ML ~~LOC~~ SOLN
60.0000 mg | Freq: Once | SUBCUTANEOUS | Status: AC
  Administered 2011-12-07: 60 mg via SUBCUTANEOUS

## 2011-12-07 MED ORDER — ESOMEPRAZOLE MAGNESIUM 40 MG PO CPDR: 40.0000 mg | DELAYED_RELEASE_CAPSULE | Freq: Two times a day (BID) | ORAL | Status: DC

## 2011-12-07 NOTE — Assessment & Plan Note (Signed)
Will decrease Nexium to qd to reduce adverse effect on osteoporosis Can use H2B if 2nd dose needed for dyspepsia symptoms

## 2011-12-07 NOTE — Assessment & Plan Note (Addendum)
Intol of oral bisphos - prev on fosamax for >15 years -  Given reclast 07/2009 then pt concerned about brittle bone causing fx - thus skipped 2nd reclast in 07/2010 -  Last dexa 06/2011 with progressive disease - also hx pelvic fx 2008 treatment heald due to complaints of jaw pain - s/p dental eval: no evidence for osteonecrosis Started Prolia today 12/2011 - plan every 6 mo

## 2011-12-07 NOTE — Patient Instructions (Addendum)
It was good to see you today. Test(s) ordered today. Your results will be called to you after review (48-72hours after test completion). If any changes need to be made, you will be notified at that time. Reduce Nexium to once daily - can use over the counter Pepcid 20g daily if 2nd dose needed for reflex symptoms  Other Medications reviewed, no changes at this time. Refill on medication(s) as discussed today. Prolia shot done today Please schedule followup in 6 months for office visit and Prolia injection, call sooner if problems.

## 2011-12-07 NOTE — Assessment & Plan Note (Signed)
The current medical regimen is usually effective (component of known white coat HTN);  continue present plan and medications.  BP Readings from Last 3 Encounters:  12/07/11 134/88  07/26/11 130/80  06/07/11 150/82

## 2011-12-07 NOTE — Assessment & Plan Note (Signed)
The current medical regimen is effective (low dose statin + fish oil);  continue present plan and medications.  Check labs now

## 2011-12-07 NOTE — Progress Notes (Signed)
  Subjective:    Patient ID: Chelsea Jordan, female    DOB: 10-31-1926, 78 y.o.   MRN: [id number]  HPI  here for followup - reviewed chronic medical issues:  asthma/reactive airway dz - no recent flares. follows with our pulm for same - Wert reports compliance with ongoing medical treatment and no changes in medication dose or frequency. denies adverse side effects related to current therapy. no change in breathing with season changes  dyslipidemia - on simva, low dose -reports compliance with ongoing medical treatment and no changes in medication dose or frequency. denies adverse side effects related to current therapy.   HTN -reports compliance with ongoing medical treatment and no changes in medication dose or frequency. denies adverse side effects related to current therapy.  variable readings at home with SBP range 110-150s - has seen cards for same - no headache, chest pain, palpitations or edema  osteoporosis - last dexa 06/2011, hx pelvic fx 2008 - Prolia begun 12/2011 -  took fosamax >15years prior and s/p single Reclast infusion 2010- concerned about BID nexium use affecting bones  Past Medical History  Diagnosis Date  . DYSLIPIDEMIA   . HYPERTENSION   . MITRAL VALVE PROLAPSE   . NEPHROLITHIASIS   . OSTEOARTHRITIS   . OSTEOPOROSIS   . COLONIC POLYPS, HX OF    Review of Systems  Constitutional: Negative for fever, fatigue and unexpected weight change.  Respiratory: Negative for cough and shortness of breath.   Cardiovascular: Negative for chest pain and leg swelling.  Genitourinary: Positive for dysuria (chronic cystitis).      Objective:   Physical Exam  BP 134/88  Pulse 77  Temp(Src) 97 F (36.1 C) (Oral)  Resp 16  Ht 5\' 4"  (1.626 m)  Wt 133 lb (60.328 kg)  BMI 22.83 kg/m2  SpO2 92% Wt Readings from Last 3 Encounters:  12/07/11 133 lb (60.328 kg)  06/07/11 132 lb 6.4 oz (60.056 kg)  01/15/11 136 lb (61.689 kg)   Constitutional: She  appears well-developed and  well-nourished. No distress.  Eyes: Conjunctivae and EOM are normal. Pupils are equal, round, and reactive to light. No scleral icterus.  Neck: Normal range of motion. Neck supple. No JVD present. No thyromegaly present.  Cardiovascular: Normal rate, regular rhythm and normal heart sounds.  No murmur. no edema BLE Pulmonary/Chest: Effort normal and breath sounds normal. No respiratory distress. She has no wheezes.  Neurological: She is alert and oriented to person, place, and time. No cranial nerve deficit. Coordination normal.  Psychiatric: She has a normal mood and affect. Her behavior is normal. Judgment and thought content normal.   Lab Results  Component Value Date   WBC 7.5 06/05/2010   HGB 14.1 06/05/2010   HCT 41.0 06/05/2010   PLT 197.0 06/05/2010   CHOL 199 06/07/2011   TRIG 55.0 06/07/2011   HDL 92.50 06/07/2011   ALT 23 12/07/2010   AST 21 12/07/2010   NA 140 06/07/2011   K 4.1 06/07/2011   CL 104 06/07/2011   CREATININE 0.5 06/07/2011   BUN 20 06/07/2011   CO2 29 06/07/2011   TSH 2.47 06/05/2010       Assessment & Plan:  See problem list. Medications and labs reviewed today.

## 2011-12-21 DIAGNOSIS — H34829 Venous engorgement, unspecified eye: Secondary | ICD-10-CM | POA: Diagnosis not present

## 2011-12-21 DIAGNOSIS — H3560 Retinal hemorrhage, unspecified eye: Secondary | ICD-10-CM | POA: Diagnosis not present

## 2011-12-21 DIAGNOSIS — H353190 Nonexudative age-related macular degeneration, unspecified eye, stage unspecified: Secondary | ICD-10-CM | POA: Diagnosis not present

## 2011-12-21 DIAGNOSIS — H35369 Drusen (degenerative) of macula, unspecified eye: Secondary | ICD-10-CM | POA: Diagnosis not present

## 2011-12-21 DIAGNOSIS — H47029 Hemorrhage in optic nerve sheath, unspecified eye: Secondary | ICD-10-CM | POA: Diagnosis not present

## 2012-01-10 ENCOUNTER — Ambulatory Visit (INDEPENDENT_AMBULATORY_CARE_PROVIDER_SITE_OTHER): Payer: Medicare Other | Admitting: Cardiology

## 2012-01-10 ENCOUNTER — Encounter: Payer: Self-pay | Admitting: Cardiology

## 2012-01-10 VITALS — BP 157/95 | HR 63 | Ht 63.0 in | Wt 137.4 lb

## 2012-01-10 DIAGNOSIS — I341 Nonrheumatic mitral (valve) prolapse: Secondary | ICD-10-CM

## 2012-01-10 DIAGNOSIS — I059 Rheumatic mitral valve disease, unspecified: Secondary | ICD-10-CM | POA: Diagnosis not present

## 2012-01-10 NOTE — Assessment & Plan Note (Signed)
Her BP is slightly low at times.  I told her to keep a blood pressure diary and is running low he would back off on her medications. She doesn't time feel fatigued when her blood pressure is low.

## 2012-01-10 NOTE — Assessment & Plan Note (Signed)
She has had no new palpitations. She has no change in her physical exam. No further imaging is indicated.

## 2012-01-10 NOTE — Progress Notes (Signed)
HPI The patient returns for one year followup. Since I last saw her she has done well.  The patient denies any new symptoms such as chest discomfort, neck or arm discomfort. There has been no new shortness of breath, PND or orthopnea. There have been no reported palpitations, presyncope or syncope.  She is active.  She does have osteoporosis.  Allergies  Allergen Reactions  . Codeine   . Levofloxacin   . Oxycodone-Acetaminophen   . Sulfonamide Derivatives     REACTION: GI upset/nausea/vomiting  . Tape Rash    Per patient adhesive tape    Current Outpatient Prescriptions  Medication Sig Dispense Refill  . Ascorbic Acid (VITAMIN C) 500 MG tablet Take 500 mg by mouth daily.        Marland Kitchen aspirin 81 MG tablet Take 81 mg by mouth daily.        . benzonatate (TESSALON) 200 MG capsule Take 200 mg by mouth every 8 (eight) hours as needed.        . calcium-vitamin D (CALCIUM 500+D) 500-200 MG-UNIT per tablet Take 1 tablet by mouth daily.        . chlorpheniramine (CHLOR-TRIMETON) 4 MG tablet Take 4 mg by mouth every 4 (four) hours as needed.        . Cholecalciferol (VITAMIN D3) 2000 UNITS TABS Take 2,000 Units by mouth daily.        Marland Kitchen denosumab (PROLIA) 60 MG/ML SOLN injection Inject 60 mg into the skin every 6 (six) months. Administer in upper arm, thigh, or abdomen  1 mL    . esomeprazole (NEXIUM) 40 MG capsule Take 1 capsule (40 mg total) by mouth daily before breakfast.  90 capsule  3  . famotidine (PEPCID) 20 MG tablet Take 1 tablet (20 mg total) by mouth at bedtime as needed for heartburn.      . fluticasone (FLONASE) 50 MCG/ACT nasal spray 2 sprays by Nasal route daily.        Marland Kitchen losartan (COZAAR) 50 MG tablet Take 1 tablet (50 mg total) by mouth daily.  90 tablet  4  . Lutein 20 MG TABS Take by mouth daily.        . Multiple Vitamins-Minerals (CENTRUM SILVER) tablet Take 1 tablet by mouth daily.        . Omega-3 Fatty Acids (FISH OIL) 1200 MG CAPS Take 1 capsule (1,200 mg total) by mouth 2  (two) times daily.  60 capsule  3  . simvastatin (ZOCOR) 5 MG tablet Take 1 tablet (5 mg total) by mouth at bedtime.  90 tablet  1  . trimethoprim (TRIMPEX) 100 MG tablet Take 100 mg by mouth at bedtime.        . vitamin E 400 UNIT capsule Take 400 Units by mouth daily.          Past Medical History  Diagnosis Date  . DYSLIPIDEMIA   . HYPERTENSION   . MITRAL VALVE PROLAPSE   . NEPHROLITHIASIS   . OSTEOARTHRITIS   . OSTEOPOROSIS   . COLONIC POLYPS, HX OF   . Chronic cystitis   . GERD (gastroesophageal reflux disease)     Past Surgical History  Procedure Date  . Cholecystectomy   . Abdominal hysterectomy   . Appendectomy   . Breast surgery   . Knee surgery   . Cataract surgery 2000  . Surgery of lip in 2002    ROS:  As stated in the HPI and negative for all other systems.  PHYSICAL  EXAM There were no vitals taken for this visit. GENERAL:  Well appearing HEENT:  Pupils equal round and reactive, fundi not visualized, oral mucosa unremarkable NECK:  No jugular venous distention, waveform within normal limits, carotid upstroke brisk and symmetric, no bruits, no thyromegaly LYMPHATICS:  No cervical, inguinal adenopathy LUNGS:  Clear to auscultation bilaterally BACK:  No CVA tenderness CHEST:  Unremarkable HEART:  PMI not displaced or sustained,S1 and S2 within normal limits, no S3, no S4, no clicks, no rubs, soft systolic murmur at the apex. ABD:  Flat, positive bowel sounds normal in frequency in pitch, no bruits, no rebound, no guarding, no midline pulsatile mass, no hepatomegaly, no splenomegaly EXT:  2 plus pulses throughout, no edema, no cyanosis no clubbing  EKG:  Sinus rhythm, rate 63, axis within normal limits, intervals within normal limits, no acute ST-T wave changes.  ASSESSMENT AND PLAN

## 2012-01-10 NOTE — Patient Instructions (Signed)
The current medical regimen is effective;  continue present plan and medications.  Follow up in 1 year with Dr Hochrein.  You will receive a letter in the mail 2 months before you are due.  Please call us when you receive this letter to schedule your follow up appointment.  

## 2012-04-05 DIAGNOSIS — H903 Sensorineural hearing loss, bilateral: Secondary | ICD-10-CM | POA: Diagnosis not present

## 2012-04-05 DIAGNOSIS — H908 Mixed conductive and sensorineural hearing loss, unspecified: Secondary | ICD-10-CM | POA: Diagnosis not present

## 2012-04-24 ENCOUNTER — Encounter: Payer: Self-pay | Admitting: Internal Medicine

## 2012-04-24 ENCOUNTER — Other Ambulatory Visit (INDEPENDENT_AMBULATORY_CARE_PROVIDER_SITE_OTHER): Payer: Medicare Other

## 2012-04-24 ENCOUNTER — Ambulatory Visit (INDEPENDENT_AMBULATORY_CARE_PROVIDER_SITE_OTHER): Payer: Medicare Other | Admitting: Internal Medicine

## 2012-04-24 ENCOUNTER — Ambulatory Visit (INDEPENDENT_AMBULATORY_CARE_PROVIDER_SITE_OTHER)
Admission: RE | Admit: 2012-04-24 | Discharge: 2012-04-24 | Disposition: A | Discharge status: Home / Self Care | Payer: Medicare Other | Source: Ambulatory Visit | Attending: Internal Medicine | Admitting: Internal Medicine

## 2012-04-24 VITALS — BP 140/88 | HR 84 | Temp 98.4°F | Resp 16 | Wt 132.0 lb

## 2012-04-24 DIAGNOSIS — R10811 Right upper quadrant abdominal tenderness: Secondary | ICD-10-CM

## 2012-04-24 DIAGNOSIS — N200 Calculus of kidney: Secondary | ICD-10-CM | POA: Diagnosis not present

## 2012-04-24 DIAGNOSIS — K432 Incisional hernia without obstruction or gangrene: Secondary | ICD-10-CM | POA: Diagnosis not present

## 2012-04-24 LAB — CBC WITH DIFFERENTIAL/PLATELET
Basophils Relative: 0.2 % (ref 0.0–3.0)
Eosinophils Absolute: 0.3 10*3/uL (ref 0.0–0.7)
Eosinophils Relative: 3 % (ref 0.0–5.0)
HCT: 42.9 % (ref 36.0–46.0)
Lymphs Abs: 1.7 10*3/uL (ref 0.7–4.0)
MCHC: 32.8 g/dL (ref 30.0–36.0)
MCV: 93.1 fl (ref 78.0–100.0)
Monocytes Absolute: 0.7 10*3/uL (ref 0.1–1.0)
Platelets: 190 10*3/uL (ref 150.0–400.0)
RBC: 4.61 Mil/uL (ref 3.87–5.11)
WBC: 9 10*3/uL (ref 4.5–10.5)

## 2012-04-24 LAB — COMPREHENSIVE METABOLIC PANEL
AST: 25 U/L (ref 0–37)
Alkaline Phosphatase: 69 U/L (ref 39–117)
Glucose, Bld: 100 mg/dL — ABNORMAL HIGH (ref 70–99)
Potassium: 4.6 mEq/L (ref 3.5–5.1)
Sodium: 137 mEq/L (ref 135–145)
Total Bilirubin: 0.9 mg/dL (ref 0.3–1.2)
Total Protein: 7 g/dL (ref 6.0–8.3)

## 2012-04-24 LAB — URINALYSIS, ROUTINE W REFLEX MICROSCOPIC
Bilirubin Urine: NEGATIVE
Hgb urine dipstick: NEGATIVE
Ketones, ur: 15
Total Protein, Urine: NEGATIVE
Urine Glucose: NEGATIVE
Urobilinogen, UA: 0.2 (ref 0.0–1.0)

## 2012-04-24 MED ORDER — IOHEXOL 300 MG/ML  SOLN
100.0000 mL | Freq: Once | INTRAMUSCULAR | Status: AC | PRN
  Administered 2012-04-24: 100 mL via INTRAVENOUS

## 2012-04-24 NOTE — Assessment & Plan Note (Signed)
She has an incisional hernia/mass with new and worsening symptoms, I spoke to Dr. Gerrit Friends (general surgery) and he asked that she have a CT scan done with oral contrast to see what is in the hernia (bowel, omentum, etc) and to see if there is some incarceration. I will check labs today to see if her kidneys can handle contrast and will look for hepatitis, pancreatitis, infection, uti. If the CT scan shows incarceration of bowel then she will see Dr. Gerrit Friends in the hospital tonight.

## 2012-04-24 NOTE — Addendum Note (Signed)
Addended by: Etta Grandchild on: 04/24/2012 05:39 PM   Modules accepted: Orders

## 2012-04-24 NOTE — Addendum Note (Signed)
Addended by: Rock Nephew T on: 04/24/2012 02:55 PM   Modules accepted: Orders

## 2012-04-24 NOTE — Progress Notes (Signed)
Subjective:    Patient ID: Chelsea Jordan, female    DOB: 10/18/1926, 78 y.o.   MRN: [id number]  Abdominal Pain This is a recurrent problem. The current episode started in the past 7 days. The onset quality is gradual. The problem occurs intermittently. The problem has been gradually worsening. The pain is located in the epigastric region. The pain is at a severity of 3/10. The pain is mild. The quality of the pain is sharp. The abdominal pain does not radiate. Associated symptoms include anorexia, constipation, nausea and vomiting. Pertinent negatives include no arthralgias, belching, diarrhea, dysuria, fever, flatus, frequency, headaches, hematochezia, hematuria, melena, myalgias or weight loss. Nothing aggravates the pain. The pain is relieved by nothing. She has tried nothing for the symptoms. Her past medical history is significant for abdominal surgery.      Review of Systems  Constitutional: Negative for fever, chills, weight loss, diaphoresis, activity change, appetite change, fatigue and unexpected weight change.  HENT: Negative.   Eyes: Negative.   Respiratory: Negative for cough, chest tightness, shortness of breath, wheezing and stridor.   Cardiovascular: Negative for chest pain, palpitations and leg swelling.  Gastrointestinal: Positive for nausea, vomiting, abdominal pain, constipation and anorexia. Negative for diarrhea, blood in stool, melena, hematochezia, abdominal distention, anal bleeding, rectal pain and flatus.  Genitourinary: Negative for dysuria, urgency, frequency, hematuria, flank pain and difficulty urinating.  Musculoskeletal: Negative for myalgias, back pain, joint swelling, arthralgias and gait problem.  Skin: Negative for color change, pallor, rash and wound.  Neurological: Negative.  Negative for headaches.  Hematological: Negative for adenopathy. Does not bruise/bleed easily.  Psychiatric/Behavioral: Negative.        Objective:   Physical Exam  Vitals  reviewed. Constitutional: She is oriented to person, place, and time. She appears well-developed and well-nourished.  Non-toxic appearance. She does not have a sickly appearance. She does not appear ill. She appears distressed.  HENT:  Head: Normocephalic and atraumatic.  Mouth/Throat: No oropharyngeal exudate.  Eyes: Conjunctivae are normal. Right eye exhibits no discharge. Left eye exhibits no discharge. No scleral icterus.  Neck: Normal range of motion. Neck supple. No JVD present. No tracheal deviation present. No thyromegaly present.  Cardiovascular: Normal rate, regular rhythm, normal heart sounds and intact distal pulses.  Exam reveals no gallop and no friction rub.   No murmur heard. Pulmonary/Chest: Effort normal and breath sounds normal. No stridor. No respiratory distress. She has no wheezes. She has no rales. She exhibits no tenderness.  Abdominal: Soft. Normal appearance and bowel sounds are normal. She exhibits no shifting dullness, no distension, no pulsatile liver, no fluid wave, no abdominal bruit, no ascites, no pulsatile midline mass and no mass. There is no hepatosplenomegaly. There is tenderness in the epigastric area. There is no rebound and no CVA tenderness. A hernia is present. Hernia confirmed positive in the ventral area. Hernia confirmed negative in the left inguinal area.         Red line shows a scar, green circle shows a hernia that is not completely reducible  Musculoskeletal: Normal range of motion. She exhibits no edema and no tenderness.  Lymphadenopathy:    She has no cervical adenopathy.  Neurological: She is oriented to person, place, and time.  Skin: Skin is warm and dry. No rash noted. She is not diaphoretic. No erythema. No pallor.  Psychiatric: She has a normal mood and affect. Her behavior is normal. Judgment and thought content normal.     Lab Results  Component Value  Date   WBC 7.5 06/05/2010   HGB 14.1 06/05/2010   HCT 41.0 06/05/2010   PLT 197.0  06/05/2010   GLUCOSE 103* 06/07/2011   CHOL 191 12/07/2011   TRIG 89.0 12/07/2011   HDL 87.60 12/07/2011   LDLCALC 86 12/07/2011   ALT 23 12/07/2010   AST 21 12/07/2010   NA 140 06/07/2011   K 4.1 06/07/2011   CL 104 06/07/2011   CREATININE 0.5 06/07/2011   BUN 20 06/07/2011   CO2 29 06/07/2011   TSH 2.47 06/05/2010       Assessment & Plan:

## 2012-04-24 NOTE — Patient Instructions (Signed)

## 2012-05-17 DIAGNOSIS — D485 Neoplasm of uncertain behavior of skin: Secondary | ICD-10-CM | POA: Diagnosis not present

## 2012-05-17 DIAGNOSIS — D235 Other benign neoplasm of skin of trunk: Secondary | ICD-10-CM | POA: Diagnosis not present

## 2012-05-19 ENCOUNTER — Ambulatory Visit (INDEPENDENT_AMBULATORY_CARE_PROVIDER_SITE_OTHER): Payer: Medicare Other | Admitting: General Surgery

## 2012-05-19 ENCOUNTER — Encounter (INDEPENDENT_AMBULATORY_CARE_PROVIDER_SITE_OTHER): Payer: Self-pay | Admitting: General Surgery

## 2012-05-19 VITALS — BP 134/76 | HR 68 | Temp 98.2°F | Resp 14 | Ht 63.5 in | Wt 131.8 lb

## 2012-05-19 DIAGNOSIS — K432 Incisional hernia without obstruction or gangrene: Secondary | ICD-10-CM | POA: Diagnosis not present

## 2012-05-19 NOTE — Patient Instructions (Signed)
Avoid repetitive heavy activities or any activities that lead to pain in that area.

## 2012-05-19 NOTE — Progress Notes (Signed)
Patient ID: Chelsea Jordan, female   DOB: 1927/02/21, 78 y.o.   MRN: [id number]  No chief complaint on file.   HPI Chelsea Jordan is a 77 y.o. female.   HPI  She is referred by Dr. Sanda Linger for evaluation of a painful incisional hernia. She had an open cholecystectomy many years ago. Over the past year, she is noticed any uncomfortable not at the medial aspect of the cholecystectomy scar in the right upper quadrant. She had 1 episode recently where it swelled up a lot and was quite painful. She underwent a CT scan at that time which was reported as sitting and no obvious hernia. The knot is still there and is somewhat uncomfortable but not nearly as painful as the acute episode one month ago. She does have a chronic intermittent cough especially during allergy season. No obstruction type symptoms.  Past Medical History  Diagnosis Date  . DYSLIPIDEMIA   . HYPERTENSION   . MITRAL VALVE PROLAPSE   . NEPHROLITHIASIS   . OSTEOARTHRITIS   . OSTEOPOROSIS   . COLONIC POLYPS, HX OF   . Chronic cystitis   . GERD (gastroesophageal reflux disease)     Past Surgical History  Procedure Date  . Cholecystectomy   . Abdominal hysterectomy   . Appendectomy   . Breast surgery   . Knee surgery   . Cataract surgery 2000  . Surgery of lip in 2002    Family History  Problem Relation Age of Onset  . Heart disease Mother   . Heart disease Father   . Breast cancer Sister   . Heart disease Other     Social History History  Substance Use Topics  . Smoking status: Never Smoker   . Smokeless tobacco: Not on file   Comment: Married, lives with spouse, Enjoys golf. Retired  . Alcohol Use: Yes     Wine    Allergies  Allergen Reactions  . Codeine   . Levofloxacin   . Oxycodone-Acetaminophen   . Sulfonamide Derivatives     REACTION: GI upset/nausea/vomiting  . Tape Rash    Per patient adhesive tape    Current Outpatient Prescriptions  Medication Sig Dispense Refill  . Ascorbic Acid  (VITAMIN C) 500 MG tablet Take 500 mg by mouth daily.        Marland Kitchen aspirin 81 MG tablet Take 81 mg by mouth daily.        . benzonatate (TESSALON) 200 MG capsule Take 200 mg by mouth every 8 (eight) hours as needed.        . calcium-vitamin D (CALCIUM 500+D) 500-200 MG-UNIT per tablet Take 1 tablet by mouth daily.        . chlorpheniramine (CHLOR-TRIMETON) 4 MG tablet Take 4 mg by mouth every 4 (four) hours as needed.        . Cholecalciferol (VITAMIN D3) 2000 UNITS TABS Take 2,000 Units by mouth daily.        Marland Kitchen denosumab (PROLIA) 60 MG/ML SOLN injection Inject 60 mg into the skin every 6 (six) months. Administer in upper arm, thigh, or abdomen  1 mL    . esomeprazole (NEXIUM) 40 MG capsule Take 1 capsule (40 mg total) by mouth daily before breakfast.  90 capsule  3  . losartan (COZAAR) 50 MG tablet Take 1 tablet (50 mg total) by mouth daily.  90 tablet  4  . Lutein 20 MG TABS Take by mouth daily.        . Multiple  Vitamins-Minerals (CENTRUM SILVER) tablet Take 1 tablet by mouth daily.        . Omega-3 Fatty Acids (FISH OIL) 1200 MG CAPS Take 1 capsule (1,200 mg total) by mouth 2 (two) times daily.  60 capsule  3  . simvastatin (ZOCOR) 5 MG tablet Take 1 tablet (5 mg total) by mouth at bedtime.  90 tablet  1  . trimethoprim (TRIMPEX) 100 MG tablet Take 100 mg by mouth at bedtime.        . vitamin E 400 UNIT capsule Take 400 Units by mouth daily.          Review of Systems Review of Systems  Respiratory: Positive for cough and wheezing.   Gastrointestinal: Positive for abdominal pain and constipation. Negative for nausea and vomiting.  Neurological: Positive for weakness.    Blood pressure 134/76, pulse 68, temperature 98.2 F (36.8 C), resp. rate 14, height 5' 3.5" (1.613 m), weight 131 lb 12.8 oz (59.784 kg).  Physical Exam Physical Exam  Constitutional: She appears well-developed and well-nourished. No distress.  HENT:  Head: Normocephalic and atraumatic.  Cardiovascular: Normal rate  and regular rhythm.   Pulmonary/Chest: Effort normal.       Distant breath sounds that are equal and clear.  Abdominal: Soft.       Right upper quadrant subcostal scar is present with a palpable, nonreducible mass that is tender. It measures about 2 cm. It is at the medial aspect of the scar. It varies slightly with a cough. There is a lower midline scar without hernia.    Data Reviewed CT scan. Note from Dr. Yetta Barre.  Assessment    Symptomatic, small incisional hernia. After examining her, I feel that I can see a very small defect on the CT scan with a little bit of fat going through it. It is in the right upper quadrant area.    Plan    We discussed nonoperative or operative options.  She would like to have the hernia repaired. I told her we would need to wait until allergy season was opened for her and her cough resolved. Then we could plan an open hernia repair with mesh as an outpatient. I've asked her to call me when her cough has resolved. I have discussed the procedure, risks, and aftercare. Risks include but are not limited to bleeding, infection, wound healing problems, anesthesia, recurrence, accidental injury to intra-abdominal organs-such as intestine, liver, spleen, bladder, etc. We also discussed the rare complication of mesh rejection. All questions were answered.       Chelsea Jordan J 05/19/2012, 2:24 PM

## 2012-05-22 ENCOUNTER — Telehealth: Payer: Self-pay | Admitting: Internal Medicine

## 2012-05-22 ENCOUNTER — Other Ambulatory Visit: Payer: Self-pay | Admitting: Internal Medicine

## 2012-05-22 NOTE — Telephone Encounter (Signed)
Please Advise

## 2012-05-22 NOTE — Telephone Encounter (Signed)
Please advise 

## 2012-05-22 NOTE — Telephone Encounter (Signed)
?  if this is the correct pt - My pt is a woman and not on insulin... - please investigate

## 2012-05-22 NOTE — Telephone Encounter (Signed)
Caller: Tony/Patient; Phone: [phone]; Reason for Call: Patient calling to get a refill on his insulin, states not able to get it refilled until the 18th of Sept.  Patient is completely out, states doctor has him taking 65 units in the morning and night.  Please call him back.  Thanks °

## 2012-05-22 NOTE — Telephone Encounter (Signed)
Pt would like to speak with nurse about her prolia injections and if she should have that done before or after her surgery due to risk of infection

## 2012-05-22 NOTE — Telephone Encounter (Signed)
Ok to wait until after surgery for next Prolia injection

## 2012-05-23 NOTE — Telephone Encounter (Signed)
Pt.notified

## 2012-05-24 ENCOUNTER — Telehealth (INDEPENDENT_AMBULATORY_CARE_PROVIDER_SITE_OTHER): Payer: Self-pay

## 2012-05-24 NOTE — Telephone Encounter (Signed)
LMOVM for Chelsea Jordan, to return call. Unknown who his provider is at this time.

## 2012-05-24 NOTE — Telephone Encounter (Signed)
Pt will have injection for osteoporosis after her surgery.  Her cough (allergies) has resolved. She would like to schedule surgery after 06/20/12.

## 2012-05-25 NOTE — Telephone Encounter (Signed)
Called again this morning LMOVM for Alinda Money still no return call.

## 2012-05-31 ENCOUNTER — Other Ambulatory Visit (INDEPENDENT_AMBULATORY_CARE_PROVIDER_SITE_OTHER): Payer: Self-pay | Admitting: General Surgery

## 2012-06-13 ENCOUNTER — Encounter: Payer: Self-pay | Admitting: Internal Medicine

## 2012-06-13 ENCOUNTER — Ambulatory Visit (INDEPENDENT_AMBULATORY_CARE_PROVIDER_SITE_OTHER): Payer: Medicare Other | Admitting: Internal Medicine

## 2012-06-13 VITALS — BP 148/82 | HR 67 | Temp 98.1°F | Ht 63.5 in | Wt 129.0 lb

## 2012-06-13 DIAGNOSIS — Z23 Encounter for immunization: Secondary | ICD-10-CM

## 2012-06-13 DIAGNOSIS — I10 Essential (primary) hypertension: Secondary | ICD-10-CM

## 2012-06-13 DIAGNOSIS — K432 Incisional hernia without obstruction or gangrene: Secondary | ICD-10-CM

## 2012-06-13 DIAGNOSIS — M810 Age-related osteoporosis without current pathological fracture: Secondary | ICD-10-CM | POA: Diagnosis not present

## 2012-06-13 DIAGNOSIS — H3530 Unspecified macular degeneration: Secondary | ICD-10-CM | POA: Diagnosis not present

## 2012-06-13 NOTE — Assessment & Plan Note (Signed)
Scheduled for elective repair 06/2012 - asymptomatic at this time Reviewed 04/2012 labs and CT - reassurance provided

## 2012-06-13 NOTE — Assessment & Plan Note (Signed)
Reports vision stable Continue optho check q21mo

## 2012-06-13 NOTE — Assessment & Plan Note (Signed)
Intol of oral bisphos - prev on fosamax for >15 years -  Given reclast 07/2009 then pt concerned about brittle bone causing fx - thus skipped 2nd reclast in 07/2010 -  Last dexa 06/2011 with progressive disease - also hx pelvic fx 2008 Initially treatment held due to complaints of jaw pain - s/p dental eval: no evidence for osteonecrosis Started Prolia 12/2011 - plan every 6 mo - next delayed until after surgical repair 06/2012

## 2012-06-13 NOTE — Progress Notes (Signed)
Subjective:    Patient ID: Chelsea Jordan, female    DOB: November 17, 1926, 78 y.o.   MRN: [id number]  HPI  Here for medicare wellness  Diet: heart healthy  Physical activity: sedentary Depression/mood screen: negative Hearing: intact to whispered voice Visual acuity: grossly normal, performs annual eye exam  ADLs: capable Fall risk: none Home safety: good Cognitive evaluation: intact to orientation, naming, recall and repetition EOL planning: adv directives, full code/ I agree  I have personally reviewed and have noted 1. The patient's medical and social history 2. Their use of alcohol, tobacco or illicit drugs 3. Their current medications and supplements 4. The patient's functional ability including ADL's, fall risks, home safety risks and hearing or visual impairment. 5. Diet and physical activities 6. Evidence for depression or mood disorders  also reviewed chronic medical issues:  asthma/reactive airway dz - no recent flares. follows with our pulm for same - Wert reports compliance with ongoing medical treatment and no changes in medication dose or frequency. denies adverse side effects related to current therapy. no change in breathing with season changes  dyslipidemia - on simva, low dose -reports compliance with ongoing medical treatment and no changes in medication dose or frequency. denies adverse side effects related to current therapy.   HTN -reports compliance with ongoing medical treatment and no changes in medication dose or frequency. denies adverse side effects related to current therapy.  variable readings at home with SBP range 110-150s - has seen cards for same - no headache, chest pain, palpitations or edema  osteoporosis - last dexa 06/2011, hx pelvic fx 2008 - Prolia begun 12/2011 -  took fosamax >15years prior and s/p single Reclast infusion 2010- concerned about PPI affecting bones  Past Medical History  Diagnosis Date  . DYSLIPIDEMIA   . HYPERTENSION   .  MITRAL VALVE PROLAPSE   . NEPHROLITHIASIS   . OSTEOARTHRITIS   . OSTEOPOROSIS   . COLONIC POLYPS, HX OF   . Chronic cystitis   . GERD (gastroesophageal reflux disease)    Family History  Problem Relation Age of Onset  . Heart disease Mother   . Heart disease Father   . Breast cancer Sister   . Heart disease Other    History  Substance Use Topics  . Smoking status: Never Smoker   . Smokeless tobacco: Not on file   Comment: Married, lives with spouse, Enjoys golf. Retired  . Alcohol Use: Yes     Wine   Review of Systems  Constitutional: Negative for fever, fatigue and unexpected weight change.  Respiratory: Negative for cough and shortness of breath.   Cardiovascular: Negative for chest pain and leg swelling.  Genitourinary: Positive for dysuria (chronic cystitis).  Gastrointestinal: Negative for abdominal pain, no bowel changes.  Musculoskeletal: Negative for gait problem or joint swelling.  Skin: Negative for rash.  Neurological: Negative for dizziness or headache.  No other specific complaints in a complete review of systems (except as listed in HPI above).     Objective:   Physical Exam  BP 148/82  Pulse 67  Temp 98.1 F (36.7 C) (Oral)  Ht 5' 3.5" (1.613 m)  Wt 129 lb (58.514 kg)  BMI 22.49 kg/m2  SpO2 97% Wt Readings from Last 3 Encounters:  06/13/12 129 lb (58.514 kg)  05/19/12 131 lb 12.8 oz (59.784 kg)  04/24/12 132 lb (59.875 kg)   Constitutional: She appears well-developed and well-nourished. No distress.  Eyes: Conjunctivae and EOM are normal. Pupils are equal, round,  and reactive to light. No scleral icterus.  Neck: Normal range of motion. Neck supple. No JVD present. No thyromegaly present.  Cardiovascular: Normal rate, regular rhythm and normal heart sounds.  No murmur. no edema BLE Pulmonary/Chest: Effort normal and breath sounds normal. No respiratory distress. She has no wheezes.  Abdomen: SNTND, reduced ventral hernia, +BS, no HSM MSkel: no  gross deformity or joint effusions Neurological: She is alert and oriented to person, place, and time. No cranial nerve deficit. Coordination, gait and speech normal.  Skin: senile changes, no worrisome changes Psychiatric: She has a normal mood and affect. Her behavior is normal. Judgment and thought content normal.   Lab Results  Component Value Date   WBC 9.0 04/24/2012   HGB 14.1 04/24/2012   HCT 42.9 04/24/2012   PLT 190.0 04/24/2012   CHOL 191 12/07/2011   TRIG 89.0 12/07/2011   HDL 87.60 12/07/2011   ALT 18 04/24/2012   AST 25 04/24/2012   NA 137 04/24/2012   K 4.6 04/24/2012   CL 100 04/24/2012   CREATININE 0.6 04/24/2012   BUN 16 04/24/2012   CO2 29 04/24/2012   TSH 2.47 06/05/2010       Assessment & Plan:  AWV/v70.0 - Today patient counseled on age appropriate routine health concerns for screening and prevention, each reviewed and up to date or declined. Immunizations reviewed and up to date or declined. Labs reviewed. Risk factors for depression reviewed and negative. Hearing function and visual acuity are intact. ADLs screened and addressed as needed. Functional ability and level of safety reviewed and appropriate. Education, counseling and referrals performed based on assessed risks today. Patient provided with a copy of personalized plan for preventive services.   Also see problem list. Medications and labs reviewed today.

## 2012-06-13 NOTE — Assessment & Plan Note (Signed)
The current medical regimen is usually effective (component of known white coat HTN);  continue present plan and medications.  BP Readings from Last 3 Encounters:  06/13/12 148/82  05/19/12 134/76  04/24/12 140/88

## 2012-06-13 NOTE — Telephone Encounter (Signed)
Will close encounter until more info obtained - presumably wrong pt info - thanks

## 2012-06-13 NOTE — Patient Instructions (Addendum)
It was good to see you today. Health Maintenance reviewed - all recommended immunizations and age-appropriate screenings are up-to-date. We have reviewed your prior records including labs and tests today Return 4-6 weeks after surgery for your next Prolia injection, call for nurse visit only Medications reviewed, no changes at this time. Please schedule followup in 6 months, call sooner if problems. Health Maintenance, Females A healthy lifestyle and preventative care can promote health and wellness.  Maintain regular health, dental, and eye exams.   Eat a healthy diet. Foods like vegetables, fruits, whole grains, low-fat dairy products, and lean protein foods contain the nutrients you need without too many calories. Decrease your intake of foods high in solid fats, added sugars, and salt. Get information about a proper diet from your caregiver, if necessary.   Regular physical exercise is one of the most important things you can do for your health. Most adults should get at least 150 minutes of moderate-intensity exercise (any activity that increases your heart rate and causes you to sweat) each week. In addition, most adults need muscle-strengthening exercises on 2 or more days a week.     Maintain a healthy weight. The body mass index (BMI) is a screening tool to identify possible weight problems. It provides an estimate of body fat based on height and weight. Your caregiver can help determine your BMI, and can help you achieve or maintain a healthy weight. For adults 20 years and older:   A BMI below 18.5 is considered underweight.   A BMI of 18.5 to 24.9 is normal.   A BMI of 25 to 29.9 is considered overweight.   A BMI of 30 and above is considered obese.   Maintain normal blood lipids and cholesterol by exercising and minimizing your intake of saturated fat. Eat a balanced diet with plenty of fruits and vegetables. Blood tests for lipids and cholesterol should begin at age 46 and be  repeated every 5 years. If your lipid or cholesterol levels are high, you are over 50, or you are a high risk for heart disease, you may need your cholesterol levels checked more frequently. Ongoing high lipid and cholesterol levels should be treated with medicines if diet and exercise are not effective.   If you smoke, find out from your caregiver how to quit. If you do not use tobacco, do not start.   If you are pregnant, do not drink alcohol. If you are breastfeeding, be very cautious about drinking alcohol. If you are not pregnant and choose to drink alcohol, do not exceed 1 drink per day. One drink is considered to be 12 ounces (355 mL) of beer, 5 ounces (148 mL) of wine, or 1.5 ounces (44 mL) of liquor.   Avoid use of street drugs. Do not share needles with anyone. Ask for help if you need support or instructions about stopping the use of drugs.   High blood pressure causes heart disease and increases the risk of stroke. Blood pressure should be checked at least every 1 to 2 years. Ongoing high blood pressure should be treated with medicines, if weight loss and exercise are not effective.   If you are 63 to 76 years old, ask your caregiver if you should take aspirin to prevent strokes.   Diabetes screening involves taking a blood sample to check your fasting blood sugar level. This should be done once every 3 years, after age 69, if you are within normal weight and without risk factors for diabetes. Testing  should be considered at a younger age or be carried out more frequently if you are overweight and have at least 1 risk factor for diabetes.   Breast cancer screening is essential preventative care for women. You should practice "breast self-awareness." This means understanding the normal appearance and feel of your breasts and may include breast self-examination. Any changes detected, no matter how small, should be reported to a caregiver. Women in their 67s and 30s should have a clinical  breast exam (CBE) by a caregiver as part of a regular health exam every 1 to 3 years. After age 44, women should have a CBE every year. Starting at age 55, women should consider having a mammogram (breast X-ray) every year. Women who have a family history of breast cancer should talk to their caregiver about genetic screening. Women at a high risk of breast cancer should talk to their caregiver about having an MRI and a mammogram every year.   The Pap test is a screening test for cervical cancer. Women should have a Pap test starting at age 35. Between ages 39 and 64, Pap tests should be repeated every 2 years. Beginning at age 70, you should have a Pap test every 3 years as long as the past 3 Pap tests have been normal. If you had a hysterectomy for a problem that was not cancer or a condition that could lead to cancer, then you no longer need Pap tests. If you are between ages 30 and 33, and you have had normal Pap tests going back 10 years, you no longer need Pap tests. If you have had past treatment for cervical cancer or a condition that could lead to cancer, you need Pap tests and screening for cancer for at least 20 years after your treatment. If Pap tests have been discontinued, risk factors (such as a new sexual partner) need to be reassessed to determine if screening should be resumed. Some women have medical problems that increase the chance of getting cervical cancer. In these cases, your caregiver may recommend more frequent screening and Pap tests.   The human papillomavirus (HPV) test is an additional test that may be used for cervical cancer screening. The HPV test looks for the virus that can cause the cell changes on the cervix. The cells collected during the Pap test can be tested for HPV. The HPV test could be used to screen women aged 42 years and older, and should be used in women of any age who have unclear Pap test results. After the age of 83, women should have HPV testing at the same  frequency as a Pap test.   Colorectal cancer can be detected and often prevented. Most routine colorectal cancer screening begins at the age of 75 and continues through age 71. However, your caregiver may recommend screening at an earlier age if you have risk factors for colon cancer. On a yearly basis, your caregiver may provide home test kits to check for hidden blood in the stool. Use of a small camera at the end of a tube, to directly examine the colon (sigmoidoscopy or colonoscopy), can detect the earliest forms of colorectal cancer. Talk to your caregiver about this at age 60, when routine screening begins. Direct examination of the colon should be repeated every 5 to 10 years through age 50, unless early forms of pre-cancerous polyps or small growths are found.   Hepatitis C blood testing is recommended for all people born from [birth year] through 1963 and  any individual with known risks for hepatitis C.   Practice safe sex. Use condoms and avoid high-risk sexual practices to reduce the spread of sexually transmitted infections (STIs). Sexually active women aged 39 and younger should be checked for Chlamydia, which is a common sexually transmitted infection. Older women with new or multiple partners should also be tested for Chlamydia. Testing for other STIs is recommended if you are sexually active and at increased risk.   Osteoporosis is a disease in which the bones lose minerals and strength with aging. This can result in serious bone fractures. The risk of osteoporosis can be identified using a bone density scan. Women ages 64 and over and women at risk for fractures or osteoporosis should discuss screening with their caregivers. Ask your caregiver whether you should be taking a calcium supplement or vitamin D to reduce the rate of osteoporosis.   Menopause can be associated with physical symptoms and risks. Hormone replacement therapy is available to decrease symptoms and risks. You should talk to  your caregiver about whether hormone replacement therapy is right for you.   Use sunscreen with a sun protection factor (SPF) of 30 or greater. Apply sunscreen liberally and repeatedly throughout the day. You should seek shade when your shadow is shorter than you. Protect yourself by wearing long sleeves, pants, a wide-brimmed hat, and sunglasses year round, whenever you are outdoors.   Notify your caregiver of new moles or changes in moles, especially if there is a change in shape or color. Also notify your caregiver if a mole is larger than the size of a pencil eraser.   Stay current with your immunizations.  Document Released: 03/08/2011 Document Revised: 11/15/2011 Document Reviewed: 03/08/2011 [facility] Patient Information 2013 [place], [place].

## 2012-06-21 ENCOUNTER — Encounter (HOSPITAL_COMMUNITY): Payer: Self-pay | Admitting: Pharmacy Technician

## 2012-06-28 ENCOUNTER — Encounter (HOSPITAL_COMMUNITY): Payer: Self-pay

## 2012-06-28 ENCOUNTER — Encounter (HOSPITAL_COMMUNITY)
Admission: RE | Admit: 2012-06-28 | Discharge: 2012-06-28 | Disposition: A | Discharge status: Home / Self Care | Payer: Medicare Other | Source: Ambulatory Visit | Attending: General Surgery | Admitting: General Surgery

## 2012-06-28 DIAGNOSIS — K430 Incisional hernia with obstruction, without gangrene: Secondary | ICD-10-CM | POA: Diagnosis not present

## 2012-06-28 DIAGNOSIS — Z01818 Encounter for other preprocedural examination: Secondary | ICD-10-CM | POA: Diagnosis not present

## 2012-06-28 DIAGNOSIS — Z01811 Encounter for preprocedural respiratory examination: Secondary | ICD-10-CM | POA: Diagnosis not present

## 2012-06-28 DIAGNOSIS — J984 Other disorders of lung: Secondary | ICD-10-CM | POA: Diagnosis not present

## 2012-06-28 DIAGNOSIS — M1990 Unspecified osteoarthritis, unspecified site: Secondary | ICD-10-CM | POA: Diagnosis not present

## 2012-06-28 DIAGNOSIS — J9819 Other pulmonary collapse: Secondary | ICD-10-CM | POA: Diagnosis not present

## 2012-06-28 DIAGNOSIS — I059 Rheumatic mitral valve disease, unspecified: Secondary | ICD-10-CM | POA: Diagnosis not present

## 2012-06-28 DIAGNOSIS — E785 Hyperlipidemia, unspecified: Secondary | ICD-10-CM | POA: Diagnosis not present

## 2012-06-28 DIAGNOSIS — K219 Gastro-esophageal reflux disease without esophagitis: Secondary | ICD-10-CM | POA: Diagnosis not present

## 2012-06-28 DIAGNOSIS — I10 Essential (primary) hypertension: Secondary | ICD-10-CM | POA: Diagnosis not present

## 2012-06-28 DIAGNOSIS — Z01812 Encounter for preprocedural laboratory examination: Secondary | ICD-10-CM | POA: Diagnosis not present

## 2012-06-28 HISTORY — DX: Other specified postprocedural states: Z98.890

## 2012-06-28 HISTORY — DX: Anxiety disorder, unspecified: F41.9

## 2012-06-28 HISTORY — DX: Other specified postprocedural states: R11.2

## 2012-06-28 LAB — SURGICAL PCR SCREEN
MRSA, PCR: NEGATIVE
Staphylococcus aureus: NEGATIVE

## 2012-06-28 LAB — CBC WITH DIFFERENTIAL/PLATELET
Basophils Absolute: 0 10*3/uL (ref 0.0–0.1)
Basophils Relative: 0 % (ref 0–1)
Eosinophils Absolute: 0.3 10*3/uL (ref 0.0–0.7)
HCT: 41.3 % (ref 36.0–46.0)
Hemoglobin: 13.8 g/dL (ref 12.0–15.0)
MCH: 30.7 pg (ref 26.0–34.0)
MCHC: 33.4 g/dL (ref 30.0–36.0)
Monocytes Absolute: 0.8 10*3/uL (ref 0.1–1.0)
Monocytes Relative: 9 % (ref 3–12)
Neutrophils Relative %: 70 % (ref 43–77)
RDW: 12.9 % (ref 11.5–15.5)

## 2012-06-28 LAB — COMPREHENSIVE METABOLIC PANEL
AST: 27 U/L (ref 0–37)
Albumin: 4 g/dL (ref 3.5–5.2)
BUN: 18 mg/dL (ref 6–23)
Creatinine, Ser: 0.53 mg/dL (ref 0.50–1.10)
Total Protein: 6.9 g/dL (ref 6.0–8.3)

## 2012-06-28 LAB — PROTIME-INR
INR: 0.94 (ref 0.00–1.49)
Prothrombin Time: 12.5 seconds (ref 11.6–15.2)

## 2012-06-28 NOTE — Pre-Procedure Instructions (Signed)
22 Chelsea Jordan  06/28/2012   Your procedure is scheduled on:  07-04-2012  Report to [facility] at 8:00 AM.  Call this number if you have problems the morning of surgery: [phone]   Remember:   Do not eat food or drink:After Midnight.    .  Take these medicines the morning of surgery with A SIP OF WATER: nexium,   Do not wear jewelry, make-up or nail polish.  Do not wear lotions, powders, or perfumes. You may wear deodorant.  Do not shave 48 hours prior to surgery.  Do not bring valuables to the hospital.  Contacts, dentures or bridgework may not be worn into surgery.  Leave suitcase in the car. After surgery it may be brought to your room.   For patients admitted to the hospital, checkout time is 11:00 AM the day of discharge.   Patients discharged the day of surgery will not be allowed to drive home.     Special Instructions: Shower using CHG 2 nights before surgery and the night before surgery.  If you shower the day of surgery use CHG.  Use special wash - you have one bottle of CHG for all showers.  You should use approximately 1/3 of the bottle for each shower.    Please read over the following fact sheets that you were given: Pain Booklet, Coughing and Deep Breathing, MRSA Information and Surgical Site Infection Prevention

## 2012-07-03 MED ORDER — CEFAZOLIN SODIUM-DEXTROSE 2-3 GM-% IV SOLR
2.0000 g | INTRAVENOUS | Status: AC
  Administered 2012-07-04: 2 g via INTRAVENOUS
  Filled 2012-07-03: qty 50

## 2012-07-04 ENCOUNTER — Ambulatory Visit (HOSPITAL_COMMUNITY)
Admission: RE | Admit: 2012-07-04 | Discharge: 2012-07-04 | Disposition: A | Discharge status: Home / Self Care | Payer: Medicare Other | Source: Ambulatory Visit | Attending: General Surgery | Admitting: General Surgery

## 2012-07-04 ENCOUNTER — Encounter (HOSPITAL_COMMUNITY): Payer: Self-pay | Admitting: Anesthesiology

## 2012-07-04 ENCOUNTER — Ambulatory Visit (HOSPITAL_COMMUNITY): Payer: Medicare Other | Admitting: Anesthesiology

## 2012-07-04 ENCOUNTER — Encounter (HOSPITAL_COMMUNITY)
Admission: RE | Disposition: A | Discharge status: Home / Self Care | Payer: Self-pay | Source: Ambulatory Visit | Attending: General Surgery

## 2012-07-04 ENCOUNTER — Encounter (HOSPITAL_COMMUNITY): Payer: Self-pay | Admitting: Surgery

## 2012-07-04 DIAGNOSIS — K439 Ventral hernia without obstruction or gangrene: Secondary | ICD-10-CM | POA: Diagnosis not present

## 2012-07-04 DIAGNOSIS — K219 Gastro-esophageal reflux disease without esophagitis: Secondary | ICD-10-CM | POA: Diagnosis not present

## 2012-07-04 DIAGNOSIS — Z01818 Encounter for other preprocedural examination: Secondary | ICD-10-CM | POA: Insufficient documentation

## 2012-07-04 DIAGNOSIS — E785 Hyperlipidemia, unspecified: Secondary | ICD-10-CM | POA: Diagnosis not present

## 2012-07-04 DIAGNOSIS — M1990 Unspecified osteoarthritis, unspecified site: Secondary | ICD-10-CM | POA: Insufficient documentation

## 2012-07-04 DIAGNOSIS — K430 Incisional hernia with obstruction, without gangrene: Secondary | ICD-10-CM | POA: Insufficient documentation

## 2012-07-04 DIAGNOSIS — Z01812 Encounter for preprocedural laboratory examination: Secondary | ICD-10-CM | POA: Diagnosis not present

## 2012-07-04 DIAGNOSIS — I059 Rheumatic mitral valve disease, unspecified: Secondary | ICD-10-CM | POA: Insufficient documentation

## 2012-07-04 DIAGNOSIS — I10 Essential (primary) hypertension: Secondary | ICD-10-CM | POA: Insufficient documentation

## 2012-07-04 HISTORY — PX: INSERTION OF MESH: SHX5868

## 2012-07-04 HISTORY — PX: VENTRAL HERNIA REPAIR: SHX424

## 2012-07-04 SURGERY — REPAIR, HERNIA, VENTRAL
Anesthesia: General | Site: Abdomen | Wound class: Clean

## 2012-07-04 MED ORDER — 0.9 % SODIUM CHLORIDE (POUR BTL) OPTIME
TOPICAL | Status: DC | PRN
  Administered 2012-07-04: 1000 mL

## 2012-07-04 MED ORDER — HYDROMORPHONE HCL PF 1 MG/ML IJ SOLN
INTRAMUSCULAR | Status: AC
  Filled 2012-07-04: qty 1

## 2012-07-04 MED ORDER — ONDANSETRON HCL 4 MG PO TABS: 4.0000 mg | ORAL_TABLET | Freq: Four times a day (QID) | ORAL | Status: DC | PRN

## 2012-07-04 MED ORDER — SCOPOLAMINE 1 MG/3DAYS TD PT72
MEDICATED_PATCH | TRANSDERMAL | Status: DC | PRN
  Administered 2012-07-04: 1 via TRANSDERMAL

## 2012-07-04 MED ORDER — LIDOCAINE HCL (CARDIAC) 20 MG/ML IV SOLN
INTRAVENOUS | Status: DC | PRN
  Administered 2012-07-04: 80 mg via INTRAVENOUS

## 2012-07-04 MED ORDER — TRAMADOL HCL 50 MG PO TABS: 50.0000 mg | ORAL_TABLET | Freq: Four times a day (QID) | ORAL | Status: DC | PRN

## 2012-07-04 MED ORDER — SODIUM CHLORIDE 0.9 % IJ SOLN: 3.0000 mL | INTRAMUSCULAR | Status: DC | PRN

## 2012-07-04 MED ORDER — BUPIVACAINE-EPINEPHRINE 0.5% -1:200000 IJ SOLN
INTRAMUSCULAR | Status: DC | PRN
Start: 2012-07-04 — End: 2012-07-04
  Administered 2012-07-04: 20 mL

## 2012-07-04 MED ORDER — ACETAMINOPHEN 650 MG RE SUPP: 650.0000 mg | RECTAL | Status: DC | PRN

## 2012-07-04 MED ORDER — PROMETHAZINE HCL 25 MG/ML IJ SOLN
6.2500 mg | INTRAMUSCULAR | Status: DC | PRN
  Administered 2012-07-04: 12.5 mg via INTRAVENOUS
  Filled 2012-07-04: qty 1

## 2012-07-04 MED ORDER — BUPIVACAINE-EPINEPHRINE (PF) 0.5% -1:200000 IJ SOLN
INTRAMUSCULAR | Status: AC
  Filled 2012-07-04: qty 1.8

## 2012-07-04 MED ORDER — BUPIVACAINE-EPINEPHRINE (PF) 0.5% -1:200000 IJ SOLN
INTRAMUSCULAR | Status: AC
  Filled 2012-07-04: qty 10

## 2012-07-04 MED ORDER — ACETAMINOPHEN 325 MG PO TABS: 650.0000 mg | ORAL_TABLET | ORAL | Status: DC | PRN

## 2012-07-04 MED ORDER — BUPIVACAINE-EPINEPHRINE PF 0.25-1:200000 % IJ SOLN
INTRAMUSCULAR | Status: AC
  Filled 2012-07-04: qty 30

## 2012-07-04 MED ORDER — ONDANSETRON HCL 4 MG/2ML IJ SOLN: 4.0000 mg | Freq: Four times a day (QID) | INTRAMUSCULAR | Status: DC | PRN

## 2012-07-04 MED ORDER — ONDANSETRON HCL 4 MG/2ML IJ SOLN
INTRAMUSCULAR | Status: DC | PRN
  Administered 2012-07-04: 4 mg via INTRAVENOUS

## 2012-07-04 MED ORDER — FENTANYL CITRATE 0.05 MG/ML IJ SOLN
INTRAMUSCULAR | Status: DC | PRN
  Administered 2012-07-04: 100 ug via INTRAVENOUS

## 2012-07-04 MED ORDER — GLYCOPYRROLATE 0.2 MG/ML IJ SOLN
INTRAMUSCULAR | Status: DC | PRN
  Administered 2012-07-04: .7 mg via INTRAVENOUS

## 2012-07-04 MED ORDER — LACTATED RINGERS IV SOLN
INTRAVENOUS | Status: DC
  Administered 2012-07-04: 09:00:00 via INTRAVENOUS

## 2012-07-04 MED ORDER — PROPOFOL 10 MG/ML IV BOLUS
INTRAVENOUS | Status: DC | PRN
  Administered 2012-07-04: 150 mg via INTRAVENOUS

## 2012-07-04 MED ORDER — HYDROMORPHONE HCL PF 1 MG/ML IJ SOLN
0.2500 mg | INTRAMUSCULAR | Status: DC | PRN
  Administered 2012-07-04 (×2): 0.5 mg via INTRAVENOUS

## 2012-07-04 MED ORDER — MIDAZOLAM HCL 5 MG/5ML IJ SOLN
INTRAMUSCULAR | Status: DC | PRN
  Administered 2012-07-04: 1 mg via INTRAVENOUS

## 2012-07-04 MED ORDER — MORPHINE SULFATE 2 MG/ML IJ SOLN: 2.0000 mg | INTRAMUSCULAR | Status: DC | PRN

## 2012-07-04 MED ORDER — PHENYLEPHRINE HCL 10 MG/ML IJ SOLN
INTRAMUSCULAR | Status: DC | PRN
  Administered 2012-07-04 (×2): 80 ug via INTRAVENOUS

## 2012-07-04 MED ORDER — DEXAMETHASONE SODIUM PHOSPHATE 4 MG/ML IJ SOLN
INTRAMUSCULAR | Status: DC | PRN
  Administered 2012-07-04: 4 mg via INTRAVENOUS

## 2012-07-04 MED ORDER — SCOPOLAMINE 1 MG/3DAYS TD PT72
MEDICATED_PATCH | TRANSDERMAL | Status: AC
  Filled 2012-07-04: qty 1

## 2012-07-04 MED ORDER — NEOSTIGMINE METHYLSULFATE 1 MG/ML IJ SOLN
INTRAMUSCULAR | Status: DC | PRN
  Administered 2012-07-04: 4 mg via INTRAVENOUS

## 2012-07-04 MED ORDER — LACTATED RINGERS IV SOLN
INTRAVENOUS | Status: DC | PRN
  Administered 2012-07-04: 09:00:00 via INTRAVENOUS

## 2012-07-04 MED ORDER — TRAMADOL HCL 50 MG PO TABS: 50.0000 mg | ORAL_TABLET | Freq: Four times a day (QID) | ORAL | Status: DC

## 2012-07-04 MED ORDER — BENZONATATE 100 MG PO CAPS
100.0000 mg | ORAL_CAPSULE | Freq: Three times a day (TID) | ORAL | Status: DC | PRN
  Administered 2012-07-04: 100 mg via ORAL
  Filled 2012-07-04: qty 1

## 2012-07-04 MED ORDER — ROCURONIUM BROMIDE 100 MG/10ML IV SOLN
INTRAVENOUS | Status: DC | PRN
  Administered 2012-07-04: 35 mg via INTRAVENOUS

## 2012-07-04 SURGICAL SUPPLY — 33 items
BLADE SURG ROTATE 9660 (MISCELLANEOUS) IMPLANT
CLOTH BEACON ORANGE TIMEOUT ST (SAFETY) ×2 IMPLANT
COVER SURGICAL LIGHT HANDLE (MISCELLANEOUS) ×2 IMPLANT
DERMABOND ADVANCED (GAUZE/BANDAGES/DRESSINGS) ×1
DERMABOND ADVANCED .7 DNX12 (GAUZE/BANDAGES/DRESSINGS) ×1 IMPLANT
DRAPE INCISE IOBAN 66X45 STRL (DRAPES) IMPLANT
DRAPE LAPAROSCOPIC ABDOMINAL (DRAPES) ×2 IMPLANT
DRESSING TELFA 8X3 (GAUZE/BANDAGES/DRESSINGS) ×2 IMPLANT
ELECT CAUTERY BLADE 6.4 (BLADE) ×2 IMPLANT
ELECT REM PT RETURN 9FT ADLT (ELECTROSURGICAL) ×2
ELECTRODE REM PT RTRN 9FT ADLT (ELECTROSURGICAL) ×1 IMPLANT
GLOVE BIOGEL PI IND STRL 7.0 (GLOVE) ×1 IMPLANT
GLOVE BIOGEL PI IND STRL 8 (GLOVE) ×1 IMPLANT
GLOVE BIOGEL PI INDICATOR 7.0 (GLOVE) ×1
GLOVE BIOGEL PI INDICATOR 8 (GLOVE) ×1
GLOVE ECLIPSE 8.0 STRL XLNG CF (GLOVE) ×2 IMPLANT
GLOVE SURG SS PI 7.0 STRL IVOR (GLOVE) ×2 IMPLANT
GOWN STRL NON-REIN LRG LVL3 (GOWN DISPOSABLE) ×4 IMPLANT
KIT BASIN OR (CUSTOM PROCEDURE TRAY) ×2 IMPLANT
KIT ROOM TURNOVER OR (KITS) ×2 IMPLANT
MESH HERNIA 3X6 (Mesh General) ×2 IMPLANT
NS IRRIG 1000ML POUR BTL (IV SOLUTION) ×2 IMPLANT
PACK GENERAL/GYN (CUSTOM PROCEDURE TRAY) ×2 IMPLANT
PAD ARMBOARD 7.5X6 YLW CONV (MISCELLANEOUS) ×2 IMPLANT
SPONGE GAUZE 4X4 12PLY (GAUZE/BANDAGES/DRESSINGS) IMPLANT
STAPLER VISISTAT 35W (STAPLE) IMPLANT
SUT NOVA NAB GS-21 0 18 T12 DT (SUTURE) ×2 IMPLANT
SUT PDS AB 0 CT 36 (SUTURE) IMPLANT
SUT VICRYL AB 2 0 TIES (SUTURE) IMPLANT
TAPE PAPER 3X10 WHT MICROPORE (GAUZE/BANDAGES/DRESSINGS) ×2 IMPLANT
TOWEL OR 17X24 6PK STRL BLUE (TOWEL DISPOSABLE) ×2 IMPLANT
TOWEL OR 17X26 10 PK STRL BLUE (TOWEL DISPOSABLE) ×2 IMPLANT
WATER STERILE IRR 1000ML POUR (IV SOLUTION) IMPLANT

## 2012-07-04 NOTE — H&P (Signed)
Chelsea Jordan is an 78 y.o. female.   Chief Complaint:   Here for elective surgery HPI:   She had an open cholecystectomy  many years ago.  She has developed a painful bulge at the medial aspect of the incision is made worse with her cough. On examination it is consistent with a hernia. She now presents for repair.  Past Medical History  Diagnosis Date  . DYSLIPIDEMIA   . HYPERTENSION   . MITRAL VALVE PROLAPSE   . OSTEOARTHRITIS   . OSTEOPOROSIS   . COLONIC POLYPS, HX OF   . Chronic cystitis   . GERD (gastroesophageal reflux disease)   . Macular degeneration     optho q52mo - Hecker  . Meningioma   . PONV (postoperative nausea and vomiting)   . NEPHROLITHIASIS   . Anxiety     Past Surgical History  Procedure Date  . Cholecystectomy   . Abdominal hysterectomy   . Appendectomy   . Knee surgery   . Cataract surgery 2000  . Surgery of lip in 2002  . Breast surgery   . Tonsillectomy     Family History  Problem Relation Age of Onset  . Heart disease Mother   . Heart disease Father   . Breast cancer Sister   . Heart disease Other    Social History:  reports that she has never smoked. She does not have any smokeless tobacco history on file. She reports that she drinks alcohol. She reports that she does not use illicit drugs.  Allergies:  Allergies  Allergen Reactions  . Pentothal (Thiopental) Nausea And Vomiting  . Codeine   . Levofloxacin   . Oxycodone-Acetaminophen   . Sulfonamide Derivatives     REACTION: GI upset/nausea/vomiting  . Tape Rash    Per patient adhesive tape    Medications Prior to Admission  Medication Sig Dispense Refill  . Ascorbic Acid (VITAMIN C) 500 MG tablet Take 500 mg by mouth daily.        Marland Kitchen aspirin 81 MG tablet Take 81 mg by mouth at bedtime.       . benzonatate (TESSALON) 200 MG capsule Take 200 mg by mouth every 8 (eight) hours as needed. For cough      . calcium-vitamin D (CALCIUM 500+D) 500-200 MG-UNIT per tablet Take 1 tablet by  mouth daily.        . chlorpheniramine (CHLOR-TRIMETON) 4 MG tablet Take 4 mg by mouth every 4 (four) hours as needed. Sinus/allegries      . Cholecalciferol (VITAMIN D3) 2000 UNITS TABS Take 2,000 Units by mouth daily.        Marland Kitchen esomeprazole (NEXIUM) 40 MG capsule Take 40 mg by mouth daily before breakfast.      . losartan (COZAAR) 50 MG tablet Take 50 mg by mouth daily.      . Lutein 20 MG TABS Take by mouth daily.        . Multiple Vitamins-Minerals (CENTRUM SILVER) tablet Take 1 tablet by mouth daily.        . OMEGA 3 1200 MG CAPS Take 1 capsule by mouth 2 (two) times daily.      . simvastatin (ZOCOR) 5 MG tablet Take 5 mg by mouth at bedtime.      Marland Kitchen trimethoprim (TRIMPEX) 100 MG tablet Take 100 mg by mouth at bedtime.        . vitamin E 400 UNIT capsule Take 400 Units by mouth daily.        Marland Kitchen  zoledronic acid (RECLAST) 5 MG/100ML SOLN Inject 5 mg into the vein every 6 (six) months.        No results found for this or any previous visit (from the past 48 hour(s)). No results found.  Review of Systems  Constitutional: Negative for fever and chills.  Gastrointestinal: Negative for nausea, vomiting and diarrhea.    Blood pressure 129/86, pulse 69, temperature 97.7 F (36.5 C), temperature source Oral, resp. rate 18, SpO2 96.00%. Physical Exam  Constitutional: She appears well-developed and well-nourished. No distress.  Cardiovascular: Normal rate and regular rhythm.   Respiratory: Effort normal and breath sounds normal.  GI: She exhibits mass (at medial aspect of RUQ incision).       Lower midline scar.  Musculoskeletal: She exhibits no edema.     Assessment/Plan Ventral incisional hernia.  Plan:  Open repair of hernia with mesh.  The procedure, risks, and aftercare were discussed with her preoperatively.  Kathryne Ramella J 07/04/2012, 9:18 AM

## 2012-07-04 NOTE — Anesthesia Preprocedure Evaluation (Addendum)
Anesthesia Evaluation  Patient identified by MRN, date of birth, ID band Patient awake    Reviewed: Allergy & Precautions, H&P , NPO status , Patient's Chart, lab work & pertinent test results  History of Anesthesia Complications (+) PONV  Airway Mallampati: II TM Distance: >3 FB Neck ROM: Full    Dental  (+) Teeth Intact, Caps and Dental Advisory Given   Pulmonary asthma ,    Pulmonary exam normal       Cardiovascular hypertension, Rhythm:Regular Rate:Normal     Neuro/Psych Anxiety    GI/Hepatic Neg liver ROS, GERD-  Medicated,  Endo/Other  negative endocrine ROS  Renal/GU Renal diseasenegative Renal ROS     Musculoskeletal   Abdominal   Peds  Hematology   Anesthesia Other Findings   Reproductive/Obstetrics                        Anesthesia Physical Anesthesia Plan  ASA: III  Anesthesia Plan: General   Post-op Pain Management:    Induction: Intravenous  Airway Management Planned: Oral ETT  Additional Equipment:   Intra-op Plan:   Post-operative Plan:   Informed Consent: I have reviewed the patients History and Physical, chart, labs and discussed the procedure including the risks, benefits and alternatives for the proposed anesthesia with the patient or authorized representative who has indicated his/her understanding and acceptance.   Dental advisory given  Plan Discussed with: CRNA, Anesthesiologist and Surgeon  Anesthesia Plan Comments:         Anesthesia Quick Evaluation

## 2012-07-04 NOTE — Interval H&P Note (Signed)
History and Physical Interval Note:  07/04/2012 9:22 AM  Chelsea Jordan  has presented today for surgery, with the diagnosis of VENTRAL HERNIA  The various methods of treatment have been discussed with the patient and family. After consideration of risks, benefits and other options for treatment, the patient has consented to  Procedure(s) (LRB) with comments: HERNIA REPAIR VENTRAL ADULT (N/A) INSERTION OF MESH (N/A) as a surgical intervention .  The patient's history has been reviewed, patient examined, no change in status, stable for surgery.  I have reviewed the patient's chart and labs.  Questions were answered to the patient's satisfaction.     Chelsea Jordan Shela Commons

## 2012-07-04 NOTE — Anesthesia Postprocedure Evaluation (Signed)
Anesthesia Post Note  Patient: Chelsea Jordan  Procedure(s) Performed: Procedure(s) (LRB): HERNIA REPAIR VENTRAL ADULT (N/A) INSERTION OF MESH (N/A)  Anesthesia type: general  Patient location: PACU  Post pain: Pain level controlled  Post assessment: Patient's Cardiovascular Status Stable  Last Vitals:  Filed Vitals:   07/04/12 1045  BP: 104/84  Pulse: 71  Temp:   Resp: 24    Post vital signs: Reviewed and stable  Level of consciousness: sedated  Complications: No apparent anesthesia complications

## 2012-07-04 NOTE — Op Note (Signed)
Operative Note  Chelsea Jordan female 78 y.o. 07/04/2012  PREOPERATIVE DX:  Ventral incisional hernia  POSTOPERATIVE DX:  Incarcerated ventral incisional hernia  PROCEDURE:  Repair of incarcerated ventral incisional hernia with mesh         Surgeon: Adolph Pollack   Assistants: None  Anesthesia: General endotracheal anesthesia  Indications:   This is an 78 year old female who developed a painful bulge at the medial aspect of her open cholecystectomy scar. She has a ventral incisional hernia clinically and now presents for repair. The procedure, risks, and aftercare were discussed with her.    Procedure Detail:  She was brought to the operating room placed supine on the operating table and a general anesthetic was administered. The abdominal wall and. A timeout was performed. I re\re incised the medial aspect of the scar and extended this medially.  I dissected through the subcutaneous tissue and saw piece of the extraperitoneal/intra-abdominal fat protruding throughout fascial defect. I could not reduce this. It is consistent with a chronic incarcerated ventral incisional hernia. Using electrocautery I excised the hernia contents and sent to pathology.  I then identified the fascial defect. I dissected subcutaneous tissue free from the primary defect 3-4 cm circumferentially. I then primarily closed the defect with 2 size 0 Novafil sutures.  I left while the sutures long. A piece of polypropylene mesh is brought into the field and cut to an appropriate size to allow for 3 cm of overlap in all directions. The suture was threaded through the mesh and tied down anchoring the mesh directly over the primary repair. The periphery of the mesh was then anchored to the fascia with a running 0 Vicryl suture. Excess mesh was trimmed and removed.  Local anesthetic consisting of half percent Marcaine was infiltrated in the fascia and subcutaneous tissues. Hemostasis was adequate. The subcutaneous  tissue was closed over the mesh with a running 3-0 Vicryl suture. The skin was closed with 4-0 Monocryl subcuticular stitch. Dermabond and a sterile dressing were applied.  She tolerated the procedure well without any apparent complications and was taken to the recovery room in satisfactory condition.    Findings: Small ventral incisional hernia with incarcerated fat in it.  Estimated Blood Loss:  less than 50 mL         Drains: none  Blood Given: none          Specimens: Hernia contents        Complications:  * No complications entered in OR log *         Disposition: PACU - hemodynamically stable.         Condition: stable

## 2012-07-04 NOTE — Preoperative (Signed)
Beta Blockers   Reason not to administer Beta Blockers:Not Applicable 

## 2012-07-04 NOTE — Transfer of Care (Signed)
Immediate Anesthesia Transfer of Care Note  Patient: Chelsea Jordan  Procedure(s) Performed: Procedure(s) (LRB) with comments: HERNIA REPAIR VENTRAL ADULT (N/A) INSERTION OF MESH (N/A)  Patient Location: PACU  Anesthesia Type:General  Level of Consciousness: awake, alert  and oriented  Airway & Oxygen Therapy: Patient Spontanous Breathing  Post-op Assessment: Report given to PACU RN and Post -op Vital signs reviewed and stable  Post vital signs: Reviewed and stable  Complications: No apparent anesthesia complications

## 2012-07-06 ENCOUNTER — Encounter (HOSPITAL_COMMUNITY): Payer: Self-pay | Admitting: General Surgery

## 2012-07-06 ENCOUNTER — Other Ambulatory Visit: Payer: Self-pay | Admitting: *Deleted

## 2012-07-06 MED ORDER — BENZONATATE 200 MG PO CAPS: 200.0000 mg | ORAL_CAPSULE | Freq: Three times a day (TID) | ORAL | Status: DC | PRN

## 2012-07-06 NOTE — Telephone Encounter (Signed)
Pt husband here for his appt. Inform nd that wife need refill on her benzonatate. Per md ok to refill...lmb

## 2012-07-11 DIAGNOSIS — H1045 Other chronic allergic conjunctivitis: Secondary | ICD-10-CM | POA: Diagnosis not present

## 2012-07-11 DIAGNOSIS — H353190 Nonexudative age-related macular degeneration, unspecified eye, stage unspecified: Secondary | ICD-10-CM | POA: Diagnosis not present

## 2012-07-11 DIAGNOSIS — H43819 Vitreous degeneration, unspecified eye: Secondary | ICD-10-CM | POA: Diagnosis not present

## 2012-07-12 ENCOUNTER — Other Ambulatory Visit: Payer: Self-pay | Admitting: Internal Medicine

## 2012-07-12 DIAGNOSIS — Z803 Family history of malignant neoplasm of breast: Secondary | ICD-10-CM

## 2012-07-12 DIAGNOSIS — Z1231 Encounter for screening mammogram for malignant neoplasm of breast: Secondary | ICD-10-CM

## 2012-07-20 ENCOUNTER — Encounter (INDEPENDENT_AMBULATORY_CARE_PROVIDER_SITE_OTHER): Payer: Medicare Other | Admitting: General Surgery

## 2012-07-24 ENCOUNTER — Other Ambulatory Visit: Payer: Self-pay | Admitting: Cardiology

## 2012-07-24 ENCOUNTER — Ambulatory Visit (INDEPENDENT_AMBULATORY_CARE_PROVIDER_SITE_OTHER): Payer: Medicare Other | Admitting: General Surgery

## 2012-07-24 ENCOUNTER — Encounter (INDEPENDENT_AMBULATORY_CARE_PROVIDER_SITE_OTHER): Payer: Self-pay | Admitting: General Surgery

## 2012-07-24 VITALS — BP 134/78 | HR 68 | Temp 98.6°F | Ht 63.0 in | Wt 133.6 lb

## 2012-07-24 DIAGNOSIS — Z9889 Other specified postprocedural states: Secondary | ICD-10-CM

## 2012-07-24 NOTE — Progress Notes (Signed)
Procedure:  Repair of ventral incisional hernia with mesh  Date:  07/04/12  Pathology:  na  History:  She is here for her first postoperative visit and is doing well. She is having no pain.  Exam: General- Is in NAD. Abdomen-incision is clean and intact with a firm ridge beneath it.  Assessment:  Doing well post ventral incisional hernia repair with mesh.  Plan:  Continue light activities for 3-4 more weeks and then resume activities as tolerated. Return visit when necessary.

## 2012-07-24 NOTE — Patient Instructions (Signed)
3-4 weeks from now, resumie normal activities as tolerated as we discussed.

## 2012-07-24 NOTE — Telephone Encounter (Signed)
Requested Prescriptions     Pending Prescriptions Disp Refills   • losartan (COZAAR) 50 MG tablet [Pharmacy Med Name: LOSARTAN POTASSIUM 50 MG TAB] 90 tablet 3     Sig: TAKE 1 TABLET BY MOUTH EVERY DAY

## 2012-08-14 ENCOUNTER — Ambulatory Visit (HOSPITAL_COMMUNITY)
Admission: RE | Admit: 2012-08-14 | Discharge: 2012-08-14 | Disposition: A | Discharge status: Home / Self Care | Payer: Medicare Other | Source: Ambulatory Visit | Attending: Internal Medicine | Admitting: Internal Medicine

## 2012-08-14 DIAGNOSIS — Z1231 Encounter for screening mammogram for malignant neoplasm of breast: Secondary | ICD-10-CM | POA: Diagnosis not present

## 2012-08-24 ENCOUNTER — Ambulatory Visit (INDEPENDENT_AMBULATORY_CARE_PROVIDER_SITE_OTHER): Payer: Medicare Other | Admitting: *Deleted

## 2012-08-24 DIAGNOSIS — M810 Age-related osteoporosis without current pathological fracture: Secondary | ICD-10-CM | POA: Diagnosis not present

## 2012-08-24 MED ORDER — DENOSUMAB 60 MG/ML ~~LOC~~ SOLN
60.0000 mg | Freq: Once | SUBCUTANEOUS | Status: AC
  Administered 2012-08-24: 60 mg via SUBCUTANEOUS

## 2012-09-01 ENCOUNTER — Ambulatory Visit: Payer: Medicare Other

## 2012-11-07 DIAGNOSIS — N200 Calculus of kidney: Secondary | ICD-10-CM | POA: Diagnosis not present

## 2012-11-07 DIAGNOSIS — N3020 Other chronic cystitis without hematuria: Secondary | ICD-10-CM | POA: Diagnosis not present

## 2012-11-07 DIAGNOSIS — R310 Gross hematuria: Secondary | ICD-10-CM | POA: Diagnosis not present

## 2012-11-21 DIAGNOSIS — N3020 Other chronic cystitis without hematuria: Secondary | ICD-10-CM | POA: Diagnosis not present

## 2012-11-21 DIAGNOSIS — R310 Gross hematuria: Secondary | ICD-10-CM | POA: Diagnosis not present

## 2012-11-28 DIAGNOSIS — N281 Cyst of kidney, acquired: Secondary | ICD-10-CM | POA: Diagnosis not present

## 2012-11-28 DIAGNOSIS — R310 Gross hematuria: Secondary | ICD-10-CM | POA: Diagnosis not present

## 2012-11-28 DIAGNOSIS — N200 Calculus of kidney: Secondary | ICD-10-CM | POA: Diagnosis not present

## 2012-11-28 DIAGNOSIS — N3020 Other chronic cystitis without hematuria: Secondary | ICD-10-CM | POA: Diagnosis not present

## 2012-12-19 ENCOUNTER — Ambulatory Visit: Payer: Medicare Other | Admitting: Internal Medicine

## 2012-12-19 DIAGNOSIS — Z0289 Encounter for other administrative examinations: Secondary | ICD-10-CM

## 2013-01-15 ENCOUNTER — Other Ambulatory Visit: Payer: Self-pay | Admitting: Internal Medicine

## 2013-02-05 ENCOUNTER — Telehealth: Payer: Self-pay | Admitting: Internal Medicine

## 2013-02-05 NOTE — Telephone Encounter (Signed)
Patient Information:  Caller Name: Drema  Phone: [phone]  Patient: Chelsea Jordan, Chelsea Jordan  Gender: Female  DOB: [birth year]  Age: 79 Years  PCP: Rene Paci (Adults only)  Office Follow Up:  Does the office need to follow up with this patient?: No  Instructions For The Office: N/A   Symptoms  Reason For Call & Symptoms: Onset 02/02/2013 shingles? on chest between breasts and under each breast, has small yellow fluid-filled bumps in red splotches.  Not painful at this time but patient rated pain earlier as 1 on 0-10 scale.  Yellowish bumps disappeared after patient showered.  (A nurse friend inspected the rash and informed patient that she has shingles.)  Reviewed Health History In EMR: Yes  Reviewed Medications In EMR: Yes  Reviewed Allergies In EMR: Yes  Reviewed Surgeries / Procedures: Yes  Date of Onset of Symptoms: 02/02/2013  Treatments Tried: Hydrocortisone cream 1% 02/04/2013 at 2100  Treatments Tried Worked: Yes  Guideline(s) Used:  Rash or Redness - Localized  Disposition Per Guideline:   See Today in Office  Reason For Disposition Reached:   Painful rash and has multiple small blisters grouped together in one area of body (i.e., dermatomal distribution or "band" or "stripe")  Advice Given:  Apply Cold to the Area:  Apply or soak in cold water for 20 minutes every 3 to 4 hours to reduce itching or pain.  Hydrocortisone Cream for Itching:   Keep the cream in the refrigerator (Reason: it feels better if applied cold).  Available over-the-counter in [place] as 0.5% and 1% cream.  Available over-the-counter in [place] as 0.5% cream.  CAUTION: Do not use hydrocortisone cream on suspected athlete's foot, jock itch, ringworm, or impetigo.  Avoid Scratching:  Try not to scratch. Cut your fingernails short.  Contagiousness:  Adults with localized rashes do not need to miss any work or school.  Expected Course:  Most of these rashes pass in 2 to 3 days.  Call Back  If:  Rash spreads or becomes worse  Rash lasts longer than 1 week  You become worse.  RN Overrode Recommendation:  Follow Up With Office Later  Patient will follow up later if needed.  Just wanted advice at this time.

## 2013-02-12 ENCOUNTER — Ambulatory Visit (INDEPENDENT_AMBULATORY_CARE_PROVIDER_SITE_OTHER): Payer: Medicare Other | Admitting: Internal Medicine

## 2013-02-12 ENCOUNTER — Encounter: Payer: Self-pay | Admitting: Internal Medicine

## 2013-02-12 VITALS — BP 132/80 | HR 66 | Temp 97.3°F | Ht 63.0 in | Wt 130.0 lb

## 2013-02-12 DIAGNOSIS — H9209 Otalgia, unspecified ear: Secondary | ICD-10-CM | POA: Diagnosis not present

## 2013-02-12 DIAGNOSIS — S51011A Laceration without foreign body of right elbow, initial encounter: Secondary | ICD-10-CM

## 2013-02-12 DIAGNOSIS — H53149 Visual discomfort, unspecified: Secondary | ICD-10-CM | POA: Diagnosis not present

## 2013-02-12 DIAGNOSIS — W108XXA Fall (on) (from) other stairs and steps, initial encounter: Secondary | ICD-10-CM | POA: Diagnosis not present

## 2013-02-12 DIAGNOSIS — W102XXA Fall (on)(from) incline, initial encounter: Secondary | ICD-10-CM

## 2013-02-12 DIAGNOSIS — H9202 Otalgia, left ear: Secondary | ICD-10-CM

## 2013-02-12 NOTE — Patient Instructions (Signed)

## 2013-02-12 NOTE — Progress Notes (Signed)
Subjective:    Patient ID: Chelsea Jordan, female    DOB: 01/09/1927, 79 y.o.   MRN: [id number]  HPI  Pt  Presents to the clinic today with c/o ear pain. This started  2 days ago. She reports that she feels it popping like there is fluid in there. It is occuring in the left ear. It does seem to have gotten better since yesterday. She has not had fever, chills or drainage from the ear. She also c/o a laceration to the arm. This occurred yesterday. It occurred during a fall. She has a bruise on her right elbow with a small laceration. She is not sure if she needs a tetanus shot. She did have her Td back in 2009.  Review of Systems  Past Medical History  Diagnosis Date  . DYSLIPIDEMIA   . HYPERTENSION   . MITRAL VALVE PROLAPSE   . OSTEOARTHRITIS   . OSTEOPOROSIS   . COLONIC POLYPS, HX OF   . Chronic cystitis   . GERD (gastroesophageal reflux disease)   . Macular degeneration     optho q7mo - [facility]  . Meningioma   . PONV (postoperative nausea and vomiting)   . NEPHROLITHIASIS   . Anxiety     Current Outpatient Prescriptions  Medication Sig Dispense Refill  . Ascorbic Acid (VITAMIN C) 500 MG tablet Take 500 mg by mouth daily.        Marland Kitchen aspirin 81 MG tablet Take 81 mg by mouth at bedtime.       . benzonatate (TESSALON) 200 MG capsule Take 1 capsule (200 mg total) by mouth every 8 (eight) hours as needed. For cough  30 capsule  0  . calcium-vitamin D (CALCIUM 500+D) 500-200 MG-UNIT per tablet Take 1 tablet by mouth daily.        . chlorpheniramine (CHLOR-TRIMETON) 4 MG tablet Take 4 mg by mouth every 4 (four) hours as needed. Sinus/allegries      . Cholecalciferol (VITAMIN D3) 2000 UNITS TABS Take 2,000 Units by mouth daily.        Marland Kitchen esomeprazole (NEXIUM) 40 MG capsule Take 40 mg by mouth daily before breakfast.      . losartan (COZAAR) 50 MG tablet Take 50 mg by mouth daily.      Marland Kitchen losartan (COZAAR) 50 MG tablet TAKE 1 TABLET BY MOUTH EVERY DAY  90 tablet  3  . Lutein 20 MG TABS Take  by mouth daily.        . Multiple Vitamins-Minerals (CENTRUM SILVER) tablet Take 1 tablet by mouth daily.        Marland Kitchen NEXIUM 40 MG capsule TAKE ONE CAPSULE TWICE A DAY  180 capsule  1  . OMEGA 3 1200 MG CAPS Take 1 capsule by mouth 2 (two) times daily.      . ondansetron (ZOFRAN) 4 MG tablet Take 1 tablet (4 mg total) by mouth every 6 (six) hours as needed for nausea.  20 tablet  0  . simvastatin (ZOCOR) 5 MG tablet Take 5 mg by mouth at bedtime.      Marland Kitchen trimethoprim (TRIMPEX) 100 MG tablet Take 100 mg by mouth at bedtime.        . vitamin E 400 UNIT capsule Take 400 Units by mouth daily.        . zoledronic acid (RECLAST) 5 MG/100ML SOLN Inject 5 mg into the vein every 6 (six) months.       No current facility-administered medications for this visit.  Allergies  Allergen Reactions  . Pentothal (Thiopental) Nausea And Vomiting  . Codeine   . Levofloxacin   . Oxycodone-Acetaminophen   . Sulfonamide Derivatives     REACTION: GI upset/nausea/vomiting  . Tape Rash    Per patient adhesive tape    Family History  Problem Relation Age of Onset  . Heart disease Mother   . Heart disease Father   . Breast cancer Sister   . Heart disease Other     History   Social History  . Marital Status: Married    Spouse Name: N/A    Number of Children: N/A  . Years of Education: N/A   Occupational History  . Not on file.   Social History Main Topics  . Smoking status: Never Smoker   . Smokeless tobacco: Not on file     Comment: Married, lives with spouse, Enjoys golf. Retired  . Alcohol Use: Yes     Comment: Wine  . Drug Use: No  . Sexually Active: Not Currently   Other Topics Concern  . Not on file   Social History Narrative   Married, lives with spouse - enjoys golf     Constitutional: Denies fever, malaise, fatigue, headache or abrupt weight changes.  HEENT: Pt reports left ear pain. Denies eye pain, eye redness, ear pain, ringing in the ears, wax buildup, runny nose, nasal  congestion, bloody nose, or sore throat.. Musculoskeletal: Denies decrease in range of motion, difficulty with gait, muscle pain or joint pain and swelling.  Skin: Pt reports laceration and bruise to right elbow. Denies redness, rashes.  Neurological: Denies dizziness, difficulty with memory, difficulty with speech or problems with balance and coordination.   No other specific complaints in a complete review of systems (except as listed in HPI above).     Objective:   Physical Exam   BP 132/80  Pulse 66  Temp(Src) 97.3 F (36.3 C) (Oral)  Ht 5\' 3"  (1.6 m)  Wt 130 lb (58.968 kg)  BMI 23.03 kg/m2  SpO2 97% Wt Readings from Last 3 Encounters:  02/12/13 130 lb (58.968 kg)  07/24/12 133 lb 9.6 oz (60.601 kg)  06/28/12 132 lb 12.8 oz (60.238 kg)    General: Appears her stated age, well developed, well nourished in NAD. Skin: Warm, dry and intact. Small bruise and laceration to left medial elbow, healing well, no evidence of infection. HEENT: Head: normal shape and size; Eyes: sclera white, no icterus, conjunctiva pink, PERRLA and EOMs intact; Ears: Tm's gray and intact, normal light reflex; Nose: mucosa pink and moist, septum midline; Throat/Mouth: Teeth present, mucosa pink and moist, no exudate, lesions or ulcerations noted.  Neck: Normal range of motion. Neck supple, trachea midline. No massses, lumps or thyromegaly present.  Cardiovascular: Normal rate and rhythm. S1,S2 noted.  No murmur, rubs or gallops noted. No JVD or BLE edema. No carotid bruits noted. Pulmonary/Chest: Normal effort and positive vesicular breath sounds. No respiratory distress. No wheezes, rales or ronchi noted.  Musculoskeletal: Normal range of motion. No signs of joint swelling. No difficulty with gait.  Neurological: Alert and oriented. Cranial nerves II-XII intact. Coordination normal. +DTRs bilaterally.       Assessment & Plan:   Left ear pain, resolved:  No evidence of infection Continue to monitor  symptoms  Bruise and laceration of left elbow:  Showing evidence of healing No treatment now Continue to monitor  RTC as needed

## 2013-03-13 DIAGNOSIS — N200 Calculus of kidney: Secondary | ICD-10-CM | POA: Diagnosis not present

## 2013-03-13 DIAGNOSIS — N3020 Other chronic cystitis without hematuria: Secondary | ICD-10-CM | POA: Diagnosis not present

## 2013-06-15 DIAGNOSIS — Z23 Encounter for immunization: Secondary | ICD-10-CM | POA: Diagnosis not present

## 2013-06-19 DIAGNOSIS — L570 Actinic keratosis: Secondary | ICD-10-CM | POA: Diagnosis not present

## 2013-06-19 DIAGNOSIS — C44111 Basal cell carcinoma of skin of unspecified eyelid, including canthus: Secondary | ICD-10-CM | POA: Diagnosis not present

## 2013-06-22 ENCOUNTER — Ambulatory Visit (INDEPENDENT_AMBULATORY_CARE_PROVIDER_SITE_OTHER): Payer: Medicare Other | Admitting: Internal Medicine

## 2013-06-22 ENCOUNTER — Other Ambulatory Visit (INDEPENDENT_AMBULATORY_CARE_PROVIDER_SITE_OTHER): Payer: Medicare Other

## 2013-06-22 ENCOUNTER — Encounter: Payer: Self-pay | Admitting: Internal Medicine

## 2013-06-22 VITALS — BP 130/80 | HR 66 | Temp 97.0°F | Ht 63.0 in | Wt 126.0 lb

## 2013-06-22 DIAGNOSIS — E785 Hyperlipidemia, unspecified: Secondary | ICD-10-CM | POA: Diagnosis not present

## 2013-06-22 DIAGNOSIS — M810 Age-related osteoporosis without current pathological fracture: Secondary | ICD-10-CM | POA: Diagnosis not present

## 2013-06-22 DIAGNOSIS — Z79899 Other long term (current) drug therapy: Secondary | ICD-10-CM

## 2013-06-22 DIAGNOSIS — R5381 Other malaise: Secondary | ICD-10-CM | POA: Diagnosis not present

## 2013-06-22 DIAGNOSIS — Z0000 Encounter for general adult medical examination without abnormal findings: Secondary | ICD-10-CM

## 2013-06-22 DIAGNOSIS — I10 Essential (primary) hypertension: Secondary | ICD-10-CM

## 2013-06-22 LAB — CBC WITH DIFFERENTIAL/PLATELET
Basophils Absolute: 0 10*3/uL (ref 0.0–0.1)
Eosinophils Absolute: 0.2 10*3/uL (ref 0.0–0.7)
HCT: 43.3 % (ref 36.0–46.0)
Hemoglobin: 14.8 g/dL (ref 12.0–15.0)
Lymphs Abs: 1.2 10*3/uL (ref 0.7–4.0)
MCHC: 34.2 g/dL (ref 30.0–36.0)
MCV: 91.2 fl (ref 78.0–100.0)
Monocytes Absolute: 0.7 10*3/uL (ref 0.1–1.0)
Neutro Abs: 4.7 10*3/uL (ref 1.4–7.7)
Neutrophils Relative %: 68.3 % (ref 43.0–77.0)
Platelets: 191 10*3/uL (ref 150.0–400.0)
RDW: 13 % (ref 11.5–14.6)

## 2013-06-22 LAB — BASIC METABOLIC PANEL
BUN: 18 mg/dL (ref 6–23)
CO2: 29 mEq/L (ref 19–32)
Chloride: 101 mEq/L (ref 96–112)
GFR: 103.12 mL/min (ref >60.00)
Glucose, Bld: 111 mg/dL — ABNORMAL HIGH (ref 70–99)
Potassium: 4.1 mEq/L (ref 3.5–5.1)
Sodium: 138 mEq/L (ref 135–145)

## 2013-06-22 LAB — LIPID PANEL
HDL: 92.8 mg/dL (ref >39.00)
Total CHOL/HDL Ratio: 2
VLDL: 16.4 mg/dL (ref 0.0–40.0)

## 2013-06-22 LAB — HEPATIC FUNCTION PANEL
ALT: 21 U/L (ref 0–35)
Bilirubin, Direct: 0.1 mg/dL (ref 0.0–0.3)
Total Bilirubin: 0.9 mg/dL (ref 0.3–1.2)

## 2013-06-22 LAB — LDL CHOLESTEROL, DIRECT: Direct LDL: 97.7 mg/dL

## 2013-06-22 LAB — TSH: TSH: 3.18 u[IU]/mL (ref 0.35–5.50)

## 2013-06-22 MED ORDER — DENOSUMAB 60 MG/ML ~~LOC~~ SOLN: 60.0000 mg | SUBCUTANEOUS | Status: DC

## 2013-06-22 MED ORDER — SIMVASTATIN 5 MG PO TABS: 5.0000 mg | ORAL_TABLET | Freq: Every day | ORAL | Status: DC

## 2013-06-22 NOTE — Assessment & Plan Note (Signed)
Intol of oral bisphos - prev on fosamax for >15 years -  Given reclast 07/2009 then pt concerned about brittle bone causing fx - thus skipped 2nd reclast in 07/2010 -  Last dexa 06/2011 with progressive disease - also hx pelvic fx 2008 Repeat now (>24mo) Initially treatment held due to complaints of jaw pain - s/p dental eval: no evidence for osteonecrosis Started Prolia 12/2011 - ongoing every 6 mo -

## 2013-06-22 NOTE — Progress Notes (Signed)
Subjective:    Patient ID: Chelsea Jordan, female    DOB: [birth year], 79 y.o.   MRN: [id number]  HPI Here for medicare wellness  Diet: heart healthy  Physical activity: sedentary Depression/mood screen: negative Hearing: intact to whispered voice Visual acuity: grossly normal, performs annual eye exam  ADLs: capable Fall risk: none Home safety: good Cognitive evaluation: intact to orientation, naming, recall and repetition EOL planning: adv directives, full code/ I agree  I have personally reviewed and have noted 1. The patient's medical and social history 2. Their use of alcohol, tobacco or illicit drugs 3. Their current medications and supplements 4. The patient's functional ability including ADL's, fall risks, home safety risks and hearing or visual impairment. 5. Diet and physical activities 6. Evidence for depression or mood disorders  also reviewed chronic medical issues:  asthma/reactive airway dz - no recent flares. follows with our pulm for same - [facility]. reports compliance with ongoing medical treatment and no changes in medication dose or frequency. denies adverse side effects related to current therapy. no change in breathing with season changes  dyslipidemia - on simva, low dose -reports compliance with ongoing medical treatment and no changes in medication dose or frequency. denies adverse side effects related to current therapy.   HTN -reports compliance with ongoing medical treatment and no changes in medication dose or frequency. denies adverse side effects related to current therapy.  variable readings at home with SBP range 110-150s - has seen cards for same - no headache, chest pain, palpitations or edema  osteoporosis - last dexa 06/2011, hx pelvic fx 2008 - Prolia begun 12/2011 -  took fosamax >15years prior and s/p single Reclast infusion 2010- concerned about PPI affecting bones  Past Medical History  Diagnosis Date  . DYSLIPIDEMIA   . HYPERTENSION   .  MITRAL VALVE PROLAPSE   . OSTEOARTHRITIS   . OSTEOPOROSIS   . COLONIC POLYPS, HX OF   . Chronic cystitis   . GERD (gastroesophageal reflux disease)   . Macular degeneration     optho q53mo - [facility]  . Meningioma   . PONV (postoperative nausea and vomiting)   . NEPHROLITHIASIS   . Anxiety    Family History  Problem Relation Age of Onset  . Heart disease Mother   . Heart disease Father   . Breast cancer Sister   . Heart disease Other    History  Substance Use Topics  . Smoking status: Never Smoker   . Smokeless tobacco: Not on file     Comment: Married, lives with spouse, Enjoys golf. Retired  . Alcohol Use: Yes     Comment: Wine   Review of Systems  Constitutional: Negative for fever, fatigue and unexpected weight change.  Respiratory: Negative for cough, shortness of breath and wheezing.   Cardiovascular: Negative for chest pain, palpitations and leg swelling.  Gastrointestinal: Negative for nausea, abdominal pain and diarrhea.  Genitourinary: Positive for dysuria (chronic cystitis).  Neurological: Negative for dizziness, weakness, light-headedness and headaches.  Psychiatric/Behavioral: Negative for dysphoric mood. The patient is not nervous/anxious.   All other systems reviewed and are negative.       Objective:   Physical Exam BP 130/80  Pulse 66  Temp(Src) 97 F (36.1 C) (Oral)  Ht 5\' 3"  (1.6 m)  Wt 126 lb (57.153 kg)  BMI 22.33 kg/m2  SpO2 97% Wt Readings from Last 3 Encounters:  06/22/13 126 lb (57.153 kg)  02/12/13 130 lb (58.968 kg)  07/24/12 133 lb 9.6  oz (60.601 kg)   Constitutional: She appears well-developed and well-nourished. No distress.  Eyes: Conjunctivae and EOM are normal. Pupils are equal, round, and reactive to light. No scleral icterus.  Neck: Normal range of motion. Neck supple. No JVD present. No thyromegaly present.  Cardiovascular: Normal rate, regular rhythm and normal heart sounds.  No murmur. no edema BLE Pulmonary/Chest: Effort  normal and breath sounds normal. No respiratory distress. She has no wheezes.  Abdomen: SNTND, reduced ventral hernia, +BS, no HSM MSkel: no gross deformity or joint effusions Neurological: She is alert and oriented to person, place, and time. No cranial nerve deficit. Coordination, gait and speech normal.  Skin: senile changes, no worrisome changes Psychiatric: She has a normal mood and affect. Her behavior is normal. Judgment and thought content normal.   Lab Results  Component Value Date   WBC 8.7 06/28/2012   HGB 13.8 06/28/2012   HCT 41.3 06/28/2012   PLT 191 06/28/2012   CHOL 191 12/07/2011   TRIG 89.0 12/07/2011   HDL 87.60 12/07/2011   ALT 18 06/28/2012   AST 27 06/28/2012   NA 138 06/28/2012   K 3.9 06/28/2012   CL 100 06/28/2012   CREATININE 0.53 06/28/2012   BUN 18 06/28/2012   CO2 28 06/28/2012   TSH 2.47 06/05/2010   INR 0.94 06/28/2012       Assessment & Plan:  AWV/v70.0 - Today patient counseled on age appropriate routine health concerns for screening and prevention, each reviewed and up to date or declined. Immunizations reviewed and up to date or declined. Labs reviewed. Risk factors for depression reviewed and negative. Hearing function and visual acuity are intact. ADLs screened and addressed as needed. Functional ability and level of safety reviewed and appropriate. Education, counseling and referrals performed based on assessed risks today. Patient provided with a copy of personalized plan for preventive services.  Fatigue - nonspecific symptoms/exam - check screening labs  Also see problem list. Medications and labs reviewed today.

## 2013-06-22 NOTE — Progress Notes (Signed)
Pre-visit discussion using our clinic review tool. No additional management support is needed unless otherwise documented below in the visit note.  

## 2013-06-22 NOTE — Assessment & Plan Note (Signed)
The current medical regimen is effective (low dose statin + fish oil);  continue present plan and medications.  Check labs annually, titrate as needed

## 2013-06-22 NOTE — Assessment & Plan Note (Signed)
The current medical regimen is usually effective (component of known white coat HTN);  continue present plan and medications.  BP Readings from Last 3 Encounters:  06/22/13 130/80  02/12/13 132/80  07/24/12 134/78

## 2013-06-22 NOTE — Patient Instructions (Addendum)
It was good to see you today.  We have reviewed your prior records including labs and tests today  Health Maintenance reviewed - all recommended immunizations and age-appropriate screenings are up-to-date.  Medications reviewed and updated, no changes recommended at this time. Refill on medication(s) as discussed today.  Test(s) ordered today. Your results will be released to MyChart (or called to you) after review, usually within 72hours after test completion. If any changes need to be made, you will be notified at that same time.  Schedule bone density scan after October 24 and schedule you for your next Prolia injection  Please schedule followup in 12 months, call sooner if problems.   Health Maintenance, Females A healthy lifestyle and preventative care can promote health and wellness.  Maintain regular health, dental, and eye exams.  Eat a healthy diet. Foods like vegetables, fruits, whole grains, low-fat dairy products, and lean protein foods contain the nutrients you need without too many calories. Decrease your intake of foods high in solid fats, added sugars, and salt. Get information about a proper diet from your caregiver, if necessary.  Regular physical exercise is one of the most important things you can do for your health. Most adults should get at least 150 minutes of moderate-intensity exercise (any activity that increases your heart rate and causes you to sweat) each week. In addition, most adults need muscle-strengthening exercises on 2 or more days a week.   Maintain a healthy weight. The body mass index (BMI) is a screening tool to identify possible weight problems. It provides an estimate of body fat based on height and weight. Your caregiver can help determine your BMI, and can help you achieve or maintain a healthy weight. For adults 20 years and older:  A BMI below 18.5 is considered underweight.  A BMI of 18.5 to 24.9 is normal.  A BMI of 25 to 29.9 is  considered overweight.  A BMI of 30 and above is considered obese.  Maintain normal blood lipids and cholesterol by exercising and minimizing your intake of saturated fat. Eat a balanced diet with plenty of fruits and vegetables. Blood tests for lipids and cholesterol should begin at age 17 and be repeated every 5 years. If your lipid or cholesterol levels are high, you are over 50, or you are a high risk for heart disease, you may need your cholesterol levels checked more frequently.Ongoing high lipid and cholesterol levels should be treated with medicines if diet and exercise are not effective.  If you smoke, find out from your caregiver how to quit. If you do not use tobacco, do not start.  If you are pregnant, do not drink alcohol. If you are breastfeeding, be very cautious about drinking alcohol. If you are not pregnant and choose to drink alcohol, do not exceed 1 drink per day. One drink is considered to be 12 ounces (355 mL) of beer, 5 ounces (148 mL) of wine, or 1.5 ounces (44 mL) of liquor.  Avoid use of street drugs. Do not share needles with anyone. Ask for help if you need support or instructions about stopping the use of drugs.  High blood pressure causes heart disease and increases the risk of stroke. Blood pressure should be checked at least every 1 to 2 years. Ongoing high blood pressure should be treated with medicines, if weight loss and exercise are not effective.  If you are 4 to 77 years old, ask your caregiver if you should take aspirin to prevent strokes.  Diabetes screening involves taking a blood sample to check your fasting blood sugar level. This should be done once every 3 years, after age 41, if you are within normal weight and without risk factors for diabetes. Testing should be considered at a younger age or be carried out more frequently if you are overweight and have at least 1 risk factor for diabetes.  Breast cancer screening is essential preventative care for  women. You should practice "breast self-awareness." This means understanding the normal appearance and feel of your breasts and may include breast self-examination. Any changes detected, no matter how small, should be reported to a caregiver. Women in their 19s and 30s should have a clinical breast exam (CBE) by a caregiver as part of a regular health exam every 1 to 3 years. After age 87, women should have a CBE every year. Starting at age 26, women should consider having a mammogram (breast X-ray) every year. Women who have a family history of breast cancer should talk to their caregiver about genetic screening. Women at a high risk of breast cancer should talk to their caregiver about having an MRI and a mammogram every year.  The Pap test is a screening test for cervical cancer. Women should have a Pap test starting at age 68. Between ages 52 and 56, Pap tests should be repeated every 2 years. Beginning at age 79, you should have a Pap test every 3 years as long as the past 3 Pap tests have been normal. If you had a hysterectomy for a problem that was not cancer or a condition that could lead to cancer, then you no longer need Pap tests. If you are between ages 31 and 24, and you have had normal Pap tests going back 10 years, you no longer need Pap tests. If you have had past treatment for cervical cancer or a condition that could lead to cancer, you need Pap tests and screening for cancer for at least 20 years after your treatment. If Pap tests have been discontinued, risk factors (such as a new sexual partner) need to be reassessed to determine if screening should be resumed. Some women have medical problems that increase the chance of getting cervical cancer. In these cases, your caregiver may recommend more frequent screening and Pap tests.  The human papillomavirus (HPV) test is an additional test that may be used for cervical cancer screening. The HPV test looks for the virus that can cause the cell  changes on the cervix. The cells collected during the Pap test can be tested for HPV. The HPV test could be used to screen women aged 53 years and older, and should be used in women of any age who have unclear Pap test results. After the age of 89, women should have HPV testing at the same frequency as a Pap test.  Colorectal cancer can be detected and often prevented. Most routine colorectal cancer screening begins at the age of 59 and continues through age 35. However, your caregiver may recommend screening at an earlier age if you have risk factors for colon cancer. On a yearly basis, your caregiver may provide home test kits to check for hidden blood in the stool. Use of a small camera at the end of a tube, to directly examine the colon (sigmoidoscopy or colonoscopy), can detect the earliest forms of colorectal cancer. Talk to your caregiver about this at age 56, when routine screening begins. Direct examination of the colon should be repeated every 5  to 10 years through age 1, unless early forms of pre-cancerous polyps or small growths are found.  Hepatitis C blood testing is recommended for all people born from [birth year] through 1963 and any individual with known risks for hepatitis C.  Practice safe sex. Use condoms and avoid high-risk sexual practices to reduce the spread of sexually transmitted infections (STIs). Sexually active women aged 47 and younger should be checked for Chlamydia, which is a common sexually transmitted infection. Older women with new or multiple partners should also be tested for Chlamydia. Testing for other STIs is recommended if you are sexually active and at increased risk.  Osteoporosis is a disease in which the bones lose minerals and strength with aging. This can result in serious bone fractures. The risk of osteoporosis can be identified using a bone density scan. Women ages 31 and over and women at risk for fractures or osteoporosis should discuss screening with their  caregivers. Ask your caregiver whether you should be taking a calcium supplement or vitamin D to reduce the rate of osteoporosis.  Menopause can be associated with physical symptoms and risks. Hormone replacement therapy is available to decrease symptoms and risks. You should talk to your caregiver about whether hormone replacement therapy is right for you.  Use sunscreen with a sun protection factor (SPF) of 30 or greater. Apply sunscreen liberally and repeatedly throughout the day. You should seek shade when your shadow is shorter than you. Protect yourself by wearing long sleeves, pants, a wide-brimmed hat, and sunglasses year round, whenever you are outdoors.  Notify your caregiver of new moles or changes in moles, especially if there is a change in shape or color. Also notify your caregiver if a mole is larger than the size of a pencil eraser.  Stay current with your immunizations. Document Released: 03/08/2011 Document Revised: 11/15/2011 Document Reviewed: 03/08/2011 [facility] Patient Information 2014 [place], [place].

## 2013-06-25 ENCOUNTER — Telehealth: Payer: Self-pay | Admitting: Internal Medicine

## 2013-06-25 NOTE — Telephone Encounter (Signed)
Patient requested copy of labs, mailed to patients home

## 2013-06-28 ENCOUNTER — Encounter: Payer: Self-pay | Admitting: Internal Medicine

## 2013-07-03 ENCOUNTER — Ambulatory Visit (INDEPENDENT_AMBULATORY_CARE_PROVIDER_SITE_OTHER)
Admission: RE | Admit: 2013-07-03 | Discharge: 2013-07-03 | Disposition: A | Discharge status: Home / Self Care | Payer: Medicare Other | Source: Ambulatory Visit | Attending: Internal Medicine | Admitting: Internal Medicine

## 2013-07-03 ENCOUNTER — Telehealth: Payer: Self-pay | Admitting: *Deleted

## 2013-07-03 DIAGNOSIS — M810 Age-related osteoporosis without current pathological fracture: Secondary | ICD-10-CM | POA: Diagnosis not present

## 2013-07-03 NOTE — Telephone Encounter (Signed)
Pt has been set-up for 08/27/13 to get her prolia...lmb

## 2013-07-03 NOTE — Telephone Encounter (Signed)
Message copied by Deatra James on Tue Jul 03, 2013  1:32 PM ------      Message from: Clover Mealy      Created: Tue Jul 03, 2013  9:58 AM      Regarding: RE: prolia injection       Hi Diem Dicocco,            I received the paperwork from [employer] for Mrs. Anastos approval for the inj.  She shouldn't have to pay out-of-pocket with her Medicare & BCBS plans.  Let me know please when she schedules.            Thanks,      Mora Bellman      ----- Message -----         From: Deatra James, MA         Sent: 06/22/2013  12:36 PM           To: Crawford Givens McClinton      Subject: prolia injection                                         Hey Mrs. Ruby it is time for Mrs. Reasons to get her next prolia. Let me know when I can call her and schedule nurse visit.            Thanks Harrington Jobe       ------

## 2013-07-13 ENCOUNTER — Encounter: Payer: Self-pay | Admitting: Internal Medicine

## 2013-07-16 ENCOUNTER — Other Ambulatory Visit: Payer: Self-pay | Admitting: Internal Medicine

## 2013-07-16 DIAGNOSIS — Z1231 Encounter for screening mammogram for malignant neoplasm of breast: Secondary | ICD-10-CM

## 2013-08-16 ENCOUNTER — Encounter: Payer: Self-pay | Admitting: Internal Medicine

## 2013-08-16 ENCOUNTER — Ambulatory Visit (HOSPITAL_COMMUNITY)
Admission: RE | Admit: 2013-08-16 | Discharge: 2013-08-16 | Disposition: A | Discharge status: Home / Self Care | Payer: Medicare Other | Source: Ambulatory Visit | Attending: Internal Medicine | Admitting: Internal Medicine

## 2013-08-16 ENCOUNTER — Ambulatory Visit (INDEPENDENT_AMBULATORY_CARE_PROVIDER_SITE_OTHER): Payer: Medicare Other | Admitting: Internal Medicine

## 2013-08-16 VITALS — BP 120/72 | HR 78 | Temp 97.5°F | Ht 63.0 in | Wt 126.0 lb

## 2013-08-16 DIAGNOSIS — Z1231 Encounter for screening mammogram for malignant neoplasm of breast: Secondary | ICD-10-CM | POA: Insufficient documentation

## 2013-08-16 DIAGNOSIS — B370 Candidal stomatitis: Secondary | ICD-10-CM

## 2013-08-16 DIAGNOSIS — H6980 Other specified disorders of Eustachian tube, unspecified ear: Secondary | ICD-10-CM | POA: Insufficient documentation

## 2013-08-16 DIAGNOSIS — H6981 Other specified disorders of Eustachian tube, right ear: Secondary | ICD-10-CM

## 2013-08-16 DIAGNOSIS — H6990 Unspecified Eustachian tube disorder, unspecified ear: Secondary | ICD-10-CM | POA: Insufficient documentation

## 2013-08-16 DIAGNOSIS — I10 Essential (primary) hypertension: Secondary | ICD-10-CM

## 2013-08-16 MED ORDER — NYSTATIN 100000 UNIT/ML MT SUSP: 500000.0000 [IU] | Freq: Four times a day (QID) | OROMUCOSAL | Status: DC

## 2013-08-16 NOTE — Assessment & Plan Note (Signed)
Mild to mod, for antibx course,  to f/u any worsening symptoms or concerns 

## 2013-08-16 NOTE — Progress Notes (Addendum)
Subjective:    Patient ID: Chelsea Jordan, female    DOB: 04-Aug-1927, 79 y.o.   MRN: [id number]  HPI here to f/u, pt now on cont'd daily antibx/trimethoprim for chronic uti with planned use for 1 yr per urology; now with whitish coating to slightly swollen tongue with very mild discomfort.  No ST, does have allergy symptoms and recurring right ear sharp pains and fullness/crepitus for several wks. Pt denies chest pain, increased sob or doe, wheezing, orthopnea, PND, increased LE swelling, palpitations, dizziness or syncope.  Pt denies polydipsia, polyuria Past Medical History  Diagnosis Date  . DYSLIPIDEMIA   . HYPERTENSION   . MITRAL VALVE PROLAPSE   . OSTEOARTHRITIS   . OSTEOPOROSIS   . COLONIC POLYPS, HX OF   . Chronic cystitis   . GERD (gastroesophageal reflux disease)   . Macular degeneration     optho q49mo - [facility]  . Meningioma   . NEPHROLITHIASIS   . Anxiety    Past Surgical History  Procedure Laterality Date  . Cholecystectomy    . Abdominal hysterectomy    . Appendectomy    . Knee surgery    . Cataract surgery  2000  . Surgery of lip  in 2002  . Breast surgery    . Tonsillectomy    . Ventral hernia repair  07/04/2012    Procedure: HERNIA REPAIR VENTRAL ADULT;  Surgeon: Adolph Pollack, MD;  Location: [facility];  Service: General;  Laterality: N/A;  . Insertion of mesh  07/04/2012    Procedure: INSERTION OF MESH;  Surgeon: Adolph Pollack, MD;  Location: [facility] OR;  Service: General;  Laterality: N/A;    reports that she has never smoked. She does not have any smokeless tobacco history on file. She reports that she drinks alcohol. She reports that she does not use illicit drugs. family history includes Breast cancer in her sister; Heart disease in her father, mother, and other. Allergies  Allergen Reactions  . Pentothal [Thiopental] Nausea And Vomiting  . Codeine   . Levofloxacin   . Oxycodone-Acetaminophen   . Sulfonamide Derivatives     REACTION: GI  upset/nausea/vomiting  . Tape Rash    Per patient adhesive tape   Current Outpatient Prescriptions on File Prior to Visit  Medication Sig Dispense Refill  . Ascorbic Acid (VITAMIN C) 500 MG tablet Take 500 mg by mouth daily.        Marland Kitchen aspirin 81 MG tablet Take 81 mg by mouth at bedtime.       . benzonatate (TESSALON) 200 MG capsule Take 1 capsule (200 mg total) by mouth every 8 (eight) hours as needed. For cough  30 capsule  0  . calcium-vitamin D (CALCIUM 500+D) 500-200 MG-UNIT per tablet Take 1 tablet by mouth daily.        . chlorpheniramine (CHLOR-TRIMETON) 4 MG tablet Take 4 mg by mouth every 4 (four) hours as needed. Sinus/allegries      . Cholecalciferol (VITAMIN D3) 2000 UNITS TABS Take 2,000 Units by mouth daily.        Marland Kitchen denosumab (PROLIA) 60 MG/ML SOLN injection Inject 60 mg into the skin every 6 (six) months. Administer in upper arm, thigh, or abdomen  1 mL  3  . esomeprazole (NEXIUM) 40 MG capsule Take 40 mg by mouth daily before breakfast.      . losartan (COZAAR) 50 MG tablet TAKE 1 TABLET BY MOUTH EVERY DAY  90 tablet  3  . Lutein 20  MG TABS Take by mouth daily.        . Multiple Vitamins-Minerals (CENTRUM SILVER) tablet Take 1 tablet by mouth daily.        Marland Kitchen NEXIUM 40 MG capsule TAKE ONE CAPSULE TWICE A DAY  180 capsule  1  . ondansetron (ZOFRAN) 4 MG tablet Take 1 tablet (4 mg total) by mouth every 6 (six) hours as needed for nausea.  20 tablet  0  . simvastatin (ZOCOR) 5 MG tablet Take 1 tablet (5 mg total) by mouth at bedtime.  90 tablet  3  . trimethoprim (TRIMPEX) 100 MG tablet Take 100 mg by mouth at bedtime.        . vitamin E 400 UNIT capsule Take 400 Units by mouth daily.         No current facility-administered medications on file prior to visit.   Review of Systems  Constitutional: Negative for unexpected weight change, or unusual diaphoresis  HENT: Negative for tinnitus.   Eyes: Negative for photophobia and visual disturbance.  Respiratory: Negative for  choking and stridor.   Gastrointestinal: Negative for vomiting and blood in stool.  Genitourinary: Negative for hematuria and decreased urine volume.  Musculoskeletal: Negative for acute joint swelling Skin: Negative for color change and wound.  Neurological: Negative for tremors and numbness other than noted  Psychiatric/Behavioral: Negative for decreased concentration or  hyperactivity.       Objective:   Physical Exam BP 120/72  Pulse 78  Temp(Src) 97.5 F (36.4 C) (Oral)  Ht 5\' 3"  (1.6 m)  Wt 126 lb (57.153 kg)  BMI 22.33 kg/m2  SpO2 94% VS noted,  Constitutional: Pt appears well-developed and well-nourished.  HENT: Head: NCAT.  Right Ear: External ear normal.  Left Ear: External ear normal.  Eyes: Conjunctivae and EOM are normal. Pupils are equal, round, and reactive to light.  Tongue with whitish coating c/w thrush, NT Right TM mild erythema, left clear Neck: Normal range of motion. Neck supple.  Cardiovascular: Normal rate and regular rhythm.   Pulmonary/Chest: Effort normal and breath sounds normal.  Neurological: Pt is alert. Not confused  Skin: Skin is warm. No erythema.  Psychiatric: Pt behavior is normal. Thought content normal.     Assessment & Plan:

## 2013-08-16 NOTE — Assessment & Plan Note (Signed)
For mucinex otc prn,  to f/u any worsening symptoms or concerns  

## 2013-08-16 NOTE — Patient Instructions (Signed)
Please take all new medication as prescribed - the nystatin solution  You can also take Mucinex (or it's generic off brand) for congestion, and tylenol as needed for pain.  Please continue all other medications as before, and refills have been done if requested.  Please have the pharmacy call with any other refills you may need.  Please remember to sign up for My Chart if you have not done so, as this will be important to you in the future with finding out test results, communicating by private email, and scheduling acute appointments online when needed.

## 2013-08-16 NOTE — Progress Notes (Signed)
Pre-visit discussion using our clinic review tool. No additional management support is needed unless otherwise documented below in the visit note.  

## 2013-08-16 NOTE — Assessment & Plan Note (Signed)
stable overall by history and exam, recent data reviewed with pt, and pt to continue medical treatment as before,  to f/u any worsening symptoms or concerns BP Readings from Last 3 Encounters:  08/16/13 120/72  06/22/13 130/80  02/12/13 132/80

## 2013-08-17 ENCOUNTER — Other Ambulatory Visit: Payer: Self-pay | Admitting: Cardiology

## 2013-08-23 DIAGNOSIS — H02829 Cysts of unspecified eye, unspecified eyelid: Secondary | ICD-10-CM | POA: Diagnosis not present

## 2013-08-24 ENCOUNTER — Other Ambulatory Visit: Payer: Self-pay | Admitting: Ophthalmology

## 2013-08-24 DIAGNOSIS — D2310 Other benign neoplasm of skin of unspecified eyelid, including canthus: Secondary | ICD-10-CM | POA: Diagnosis not present

## 2013-08-27 ENCOUNTER — Ambulatory Visit: Payer: Medicare Other

## 2013-09-24 ENCOUNTER — Encounter: Payer: Self-pay | Admitting: Cardiology

## 2013-10-01 DIAGNOSIS — H40059 Ocular hypertension, unspecified eye: Secondary | ICD-10-CM | POA: Diagnosis not present

## 2013-10-01 DIAGNOSIS — H40019 Open angle with borderline findings, low risk, unspecified eye: Secondary | ICD-10-CM | POA: Diagnosis not present

## 2013-10-11 ENCOUNTER — Other Ambulatory Visit: Payer: Self-pay | Admitting: Internal Medicine

## 2013-10-11 ENCOUNTER — Ambulatory Visit: Payer: Medicare Other | Admitting: Cardiology

## 2013-10-15 ENCOUNTER — Ambulatory Visit (INDEPENDENT_AMBULATORY_CARE_PROVIDER_SITE_OTHER): Payer: Medicare Other | Admitting: Cardiology

## 2013-10-15 ENCOUNTER — Encounter: Payer: Self-pay | Admitting: Cardiology

## 2013-10-15 VITALS — BP 146/94 | HR 67 | Ht 63.0 in | Wt 128.0 lb

## 2013-10-15 DIAGNOSIS — I10 Essential (primary) hypertension: Secondary | ICD-10-CM | POA: Diagnosis not present

## 2013-10-15 DIAGNOSIS — I059 Rheumatic mitral valve disease, unspecified: Secondary | ICD-10-CM | POA: Diagnosis not present

## 2013-10-15 NOTE — Patient Instructions (Signed)
The current medical regimen is effective;  continue present plan and medications.  Follow up in 1 year with Dr Hochrein.  You will receive a letter in the mail 2 months before you are due.  Please call us when you receive this letter to schedule your follow up appointment.  

## 2013-10-15 NOTE — Progress Notes (Signed)
HPI The patient returns for one year followup. Since I last saw her she has done well.   There has been no new shortness of breath, PND or orthopnea. There have been no reported palpitations, presyncope or syncope.  She is active and still golfs.  She walks the stairs for exercise.  She has been having some chest discomfort. This has been for about a month. Very mild discomfort that she notices periodically. He is 1/10. There are no associated symptoms. There is no nausea or vomiting or diaphoresis. She cannot bring this on with her activities. It seems to come and go by itself. She can't quantify it is sharp or dull. She simply mentions it but is not particularly problematic for her.  Allergies  Allergen Reactions  . Pentothal [Thiopental] Nausea And Vomiting  . Codeine   . Levofloxacin   . Oxycodone-Acetaminophen   . Sulfonamide Derivatives     REACTION: GI upset/nausea/vomiting  . Tape Rash    Per patient adhesive tape    Current Outpatient Prescriptions  Medication Sig Dispense Refill  . Ascorbic Acid (VITAMIN C) 500 MG tablet Take 500 mg by mouth daily.        Marland Kitchen aspirin 81 MG tablet Take 81 mg by mouth at bedtime.       . benzonatate (TESSALON) 200 MG capsule Take 1 capsule (200 mg total) by mouth every 8 (eight) hours as needed. For cough  30 capsule  0  . calcium-vitamin D (CALCIUM 500+D) 500-200 MG-UNIT per tablet Take 1 tablet by mouth daily.        . chlorpheniramine (CHLOR-TRIMETON) 4 MG tablet Take 4 mg by mouth every 4 (four) hours as needed. Sinus/allegries      . Cholecalciferol (VITAMIN D3) 2000 UNITS TABS Take 2,000 Units by mouth daily.        Marland Kitchen denosumab (PROLIA) 60 MG/ML SOLN injection Inject 60 mg into the skin every 6 (six) months. Administer in upper arm, thigh, or abdomen  1 mL  3  . esomeprazole (NEXIUM) 40 MG capsule Take 40 mg by mouth daily before breakfast.      . losartan (COZAAR) 50 MG tablet TAKE 1 TABLET BY MOUTH ONCE DAILY  90 tablet  0  . Lutein 20 MG  TABS Take by mouth daily.        . Multiple Vitamins-Minerals (CENTRUM SILVER) tablet Take 1 tablet by mouth daily.        Marland Kitchen NEXIUM 40 MG capsule TAKE ONE CAPSULE TWICE A DAY  180 capsule  1  . nystatin (MYCOSTATIN) 100000 UNIT/ML suspension Take 5 mLs (500,000 Units total) by mouth 4 (four) times daily.  60 mL  2  . ondansetron (ZOFRAN) 4 MG tablet Take 1 tablet (4 mg total) by mouth every 6 (six) hours as needed for nausea.  20 tablet  0  . simvastatin (ZOCOR) 5 MG tablet Take 1 tablet (5 mg total) by mouth at bedtime.  90 tablet  3  . trimethoprim (TRIMPEX) 100 MG tablet Take 100 mg by mouth at bedtime.        . vitamin E 400 UNIT capsule Take 400 Units by mouth daily.         No current facility-administered medications for this visit.    Past Medical History  Diagnosis Date  . DYSLIPIDEMIA   . HYPERTENSION   . MITRAL VALVE PROLAPSE   . OSTEOARTHRITIS   . OSTEOPOROSIS   . COLONIC POLYPS, HX OF   . Chronic  cystitis   . GERD (gastroesophageal reflux disease)   . Macular degeneration     optho q61mo - [facility]  . Meningioma   . NEPHROLITHIASIS   . Anxiety     Past Surgical History  Procedure Laterality Date  . Cholecystectomy    . Abdominal hysterectomy    . Appendectomy    . Knee surgery    . Cataract surgery  2000  . Surgery of lip  in 2002  . Breast surgery    . Tonsillectomy    . Ventral hernia repair  07/04/2012    Procedure: HERNIA REPAIR VENTRAL ADULT;  Surgeon: Odis Hollingshead, MD;  Location: [place];  Service: General;  Laterality: N/A;  . Insertion of mesh  07/04/2012    Procedure: INSERTION OF MESH;  Surgeon: Odis Hollingshead, MD;  Location: [place];  Service: General;  Laterality: N/A;    ROS:  As stated in the HPI and negative for all other systems.  PHYSICAL EXAM Pulse 67  Ht 5\' 3"  (1.6 m)  Wt 128 lb (58.06 kg)  BMI 22.68 kg/m2 GENERAL:  Well appearing HEENT:  Pupils equal round and reactive, fundi not visualized, oral mucosa unremarkable NECK:  No  jugular venous distention, waveform within normal limits, carotid upstroke brisk and symmetric, no bruits, no thyromegaly LYMPHATICS:  No cervical, inguinal adenopathy LUNGS:  Clear to auscultation bilaterally BACK:  No CVA tenderness CHEST:  Unremarkable HEART:  PMI not displaced or sustained,S1 and S2 within normal limits, no S3, no S4, no clicks, no rubs, soft systolic murmur at the apex. ABD:  Flat, positive bowel sounds normal in frequency in pitch, no bruits, no rebound, no guarding, no midline pulsatile mass, no hepatomegaly, no splenomegaly EXT:  2 plus pulses throughout, no edema, no cyanosis no clubbing  EKG:  Sinus rhythm, rate 67, axis within normal limits, intervals within normal limits, no acute ST-T wave changes.  10/15/2013  ASSESSMENT AND PLAN  CHEST PAIN:  This is very atypical. The pretest debility of obstructive coronary disease as an etiology is very low. I don't think that further cardiovascular testing is suggested.  AS:  This has been very mild on echo in the past. I would not suspect any change based on physical exam and lack of symptoms. No further imaging is warranted.  MVP:  I don't hear any significant regurgitant murmur clicks. No further imaging is planned.  HTN:  This is mildly elevated but this has been unusual. She will continue the meds as listed.

## 2013-10-16 ENCOUNTER — Ambulatory Visit: Payer: Medicare Other | Admitting: Cardiology

## 2013-10-17 ENCOUNTER — Encounter: Payer: Self-pay | Admitting: Cardiology

## 2013-10-31 DIAGNOSIS — Z85828 Personal history of other malignant neoplasm of skin: Secondary | ICD-10-CM | POA: Diagnosis not present

## 2013-11-15 ENCOUNTER — Other Ambulatory Visit: Payer: Self-pay | Admitting: Cardiology

## 2013-11-15 DIAGNOSIS — R350 Frequency of micturition: Secondary | ICD-10-CM | POA: Diagnosis not present

## 2013-11-15 DIAGNOSIS — N3020 Other chronic cystitis without hematuria: Secondary | ICD-10-CM | POA: Diagnosis not present

## 2013-11-15 DIAGNOSIS — R109 Unspecified abdominal pain: Secondary | ICD-10-CM | POA: Diagnosis not present

## 2013-12-13 NOTE — Telephone Encounter (Signed)
Error

## 2013-12-25 DIAGNOSIS — L905 Scar conditions and fibrosis of skin: Secondary | ICD-10-CM | POA: Diagnosis not present

## 2013-12-28 ENCOUNTER — Encounter: Payer: Self-pay | Admitting: Family Medicine

## 2013-12-28 ENCOUNTER — Ambulatory Visit (INDEPENDENT_AMBULATORY_CARE_PROVIDER_SITE_OTHER)
Admission: RE | Admit: 2013-12-28 | Discharge: 2013-12-28 | Disposition: A | Discharge status: Home / Self Care | Payer: Medicare Other | Source: Ambulatory Visit | Attending: Family Medicine | Admitting: Family Medicine

## 2013-12-28 ENCOUNTER — Ambulatory Visit (INDEPENDENT_AMBULATORY_CARE_PROVIDER_SITE_OTHER): Payer: Medicare Other | Admitting: Family Medicine

## 2013-12-28 VITALS — BP 120/76 | HR 77 | Temp 98.7°F | Wt 126.6 lb

## 2013-12-28 DIAGNOSIS — J209 Acute bronchitis, unspecified: Secondary | ICD-10-CM

## 2013-12-28 DIAGNOSIS — J449 Chronic obstructive pulmonary disease, unspecified: Secondary | ICD-10-CM | POA: Diagnosis not present

## 2013-12-28 MED ORDER — METHYLPREDNISOLONE ACETATE 80 MG/ML IJ SUSP
80.0000 mg | Freq: Once | INTRAMUSCULAR | Status: AC
  Administered 2013-12-28: 80 mg via INTRAMUSCULAR

## 2013-12-28 MED ORDER — PREDNISONE 10 MG PO TABS: ORAL_TABLET | ORAL | Status: DC

## 2013-12-28 MED ORDER — CLARITHROMYCIN ER 500 MG PO TB24: 1000.0000 mg | ORAL_TABLET | Freq: Every day | ORAL | Status: DC

## 2013-12-28 MED ORDER — FORMOTEROL FUMARATE 20 MCG/2ML IN NEBU
20.0000 ug | INHALATION_SOLUTION | Freq: Once | RESPIRATORY_TRACT | Status: AC
  Administered 2013-12-28: 20 ug via RESPIRATORY_TRACT

## 2013-12-28 NOTE — Patient Instructions (Signed)

## 2013-12-28 NOTE — Progress Notes (Signed)
Pre visit review using our clinic review tool, if applicable. No additional management support is needed unless otherwise documented below in the visit note. 

## 2013-12-28 NOTE — Progress Notes (Signed)
  Subjective:     Chelsea Jordan is a 80 y.o. female who presents for evaluation of symptoms of a URI. Symptoms include congestion, nasal congestion, no  fever, productive cough with  green colored sputum, shortness of breath and wheezing. Onset of symptoms was 3 days ago, and has been gradually worsening since that time. Treatment to date: none.  The following portions of the patient's history were reviewed and updated as appropriate: allergies, current medications, past family history, past medical history, past social history, past surgical history and problem list.  Review of Systems Pertinent items are noted in HPI.   Objective:    BP 120/76  Pulse 77  Temp(Src) 98.7 F (37.1 C) (Oral)  Wt 126 lb 9.6 oz (57.425 kg)  SpO2 99% General appearance: alert, cooperative, appears stated age and no distress Ears: normal TM's and external ear canals both ears Nose: green discharge, moderate congestion, turbinates red, swollen, no sinus tenderness Throat: lips, mucosa, and tongue normal; teeth and gums normal Neck: moderate anterior cervical adenopathy, supple, symmetrical, trachea midline and thyroid not enlarged, symmetric, no tenderness/mass/nodules Lungs: wheezes bilaterally Heart: S1, S2 normal   Assessment:    bronchitis   Plan:    Discussed diagnosis and treatment of URI. Suggested symptomatic OTC remedies. biaxin per orders. Follow up as needed.  pred taper

## 2014-01-02 ENCOUNTER — Encounter: Payer: Self-pay | Admitting: Internal Medicine

## 2014-01-02 ENCOUNTER — Ambulatory Visit (INDEPENDENT_AMBULATORY_CARE_PROVIDER_SITE_OTHER): Payer: Medicare Other | Admitting: Internal Medicine

## 2014-01-02 VITALS — BP 110/74 | HR 78 | Temp 98.3°F | Ht 63.0 in | Wt 127.6 lb

## 2014-01-02 DIAGNOSIS — R059 Cough, unspecified: Secondary | ICD-10-CM

## 2014-01-02 DIAGNOSIS — R058 Other specified cough: Secondary | ICD-10-CM

## 2014-01-02 DIAGNOSIS — R05 Cough: Secondary | ICD-10-CM

## 2014-01-02 DIAGNOSIS — I10 Essential (primary) hypertension: Secondary | ICD-10-CM | POA: Diagnosis not present

## 2014-01-02 MED ORDER — ESOMEPRAZOLE MAGNESIUM 40 MG PO CPDR: DELAYED_RELEASE_CAPSULE | ORAL | Status: DC

## 2014-01-02 MED ORDER — NEBIVOLOL HCL 5 MG PO TABS: 5.0000 mg | ORAL_TABLET | Freq: Every day | ORAL | Status: DC

## 2014-01-02 NOTE — Patient Instructions (Signed)
For drainage take chlortrimeton (chlorpheniramine) 4 mg every 4 hours available over the counter (may cause drowsiness)   Stop cozar (losartan)  Start bystolic 5 mg daily   Increase nexium 40 mg Take 30- 60 min before your first and last meals of the day   GERD (REFLUX)  is an extremely common cause of respiratory symptoms, many times with no significant heartburn at all.    It can be treated with medication, but also with lifestyle changes including avoidance of late meals, excessive alcohol, smoking cessation, and avoid fatty foods, chocolate, peppermint, colas, red wine, and acidic juices such as orange juice.  NO MINT OR MENTHOL PRODUCTS SO NO COUGH DROPS  USE SUGARLESS CANDY INSTEAD (jolley ranchers or Stover's)  NO OIL BASED VITAMINS - use powdered substitutes.  See Tammy NP w/in 2 weeks with all your medications, even over the counter meds, separated in two separate bags, the ones you take no matter what vs the ones you stop once you feel better and take only as needed when you feel you need them.   Tammy  will generate for you a new user friendly medication calendar that will put [place] all on the same page re: your medication use.     Without this process, it simply isn't possible to assure that we are providing  your outpatient care  with  the attention to detail we feel you deserve.   If we cannot assure that you're getting that kind of care,  then we cannot manage your problem effectively from this clinic.  Once you have seen Tammy and we are sure that we're all on the same page with your medication use she will arrange follow up with me.

## 2014-01-02 NOTE — Progress Notes (Signed)
Subjective:    Patient ID: Chelsea Jordan, female    DOB: [birth year]  MRN: [id number]  HPI  64 yowf never smoker with cough onset in the 1970's and coughing daily since but much better p rx for UACS in 2010 aggravated by ACEI   referred back to pulmonary clinic 01/02/14 by Dr Etter Sjogren    History of Present Illness  February 20, 2009 ov with report cough waxes and wanes but typically gone while sleeping then resumes after standing up but also immediately on lying down. worse in ac if cold or in winter. coughs so hard she vomits. pnds seemed better with allegra with no impact on cough. no response to prednisone though only took for short course and not for cough.  [facility] during [facility] in [facility] 01/19/2009 and rx with PPI bid ac and zantac at bedtime still coughs so hard she vomits. off ace and fish oil since admit. minimal overall improvement  rec  continue nexium and zantac as before  stop allegra  if sensation of allergies or drainage use chlortrimeton 4 mg every 6h  reglan 10 mg take one before each meal and at bedtime   March 20, 2009 ov thinks the zantac made it worse, overall cough " no change since 1970's " except for "days she held the zantac" (note this was a recent addition) .   April 29, 2009--Presents for follow up and med review. Last visit, rx prednisone taper, chlor trimeton, cough control w/ meperidine (never took)Brought all meds for reveiw. Rx for reglan finished, no refills on bottle. Since last visit cough is much better, feels chlortrimeton is really helping. Cough is 75% better.  rec  no change rx x try off reglan  May 13, 2009 ov thinks chlortrimeton and tessilon work the best but happy that the cough is finally controlled after 40 years!  rec continue rx for gerd/ pnds/ f/u prn      01/02/2014 new consultation  ov/Tasman Zapata re: recurrent cough x one year Chief Complaint  Patient presents with  . Pulmonary Consult    Referred by Dr. Etter Sjogren for bronchitis and abnormal  cxr per pt.   baseline cough tends to be every night when rollover x one year  Then much worse x 2 weeks daytime  with sense of chest congestion > mucus is clear, minimal production Prednisone helping some Already on nexium but not 1st gen H1 as before and not taking nexium consistently ac and not bid as before. No longer on reglan. On biaxin / pred started 4/25 for acute worsening cough in setting of nasal congestion with green mucus which cleared back to baseline mucus prod/ chronic cough p 5 days of rx.  No obvious other patterns in day to day or daytime variabilty or assoc sob (except when coughing) or cp or chest tightness, subjective wheeze   or hb symptoms. No unusual exp hx or h/o childhood pna/ asthma or knowledge of premature birth.  Sleeping ok without nocturnal  or early am exacerbation  of respiratory  c/o's or need for noct saba. Also denies any obvious fluctuation of symptoms with weather or environmental changes or other aggravating or alleviating factors except as outlined above   Current Medications, Allergies, Complete Past Medical History, Past Surgical History, Family History, and Social History were reviewed in [employer] record.                 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)  - [facility] eval 2009 ok  NEPHROLITHIASIS (ICD-592.0)  MENINGIOMA (ICD-225.2)  OSTEOARTHRITIS (ICD-715.90)  COUGH onset around 1980...........................................................Marland KitchenWert  - EGD c/w es dysmotility 09/29/07  - Gastric emptying study nl 07/26/07  - CT Chest nl 05/04/07  -Initial consult dictated Jan 19, 2009 try off biphosphonate/fish oil > rx ppi two times a day and zantac at hs  - February 20, 2009 add reglan 10 mg ac and hs x 1 month trial  Osteoporosis  --BMD -3.0 (06/13/09)- (intolerant to bisphosphanate) Reclast rx 07/28/2009  --vitamin d 66 (10/10)         Review of  Systems  Constitutional: Negative for fever and unexpected weight change.  HENT: Positive for congestion, ear pain, postnasal drip, rhinorrhea, sinus pressure and sore throat. Negative for dental problem, nosebleeds, sneezing and trouble swallowing.   Eyes: Negative for redness and itching.  Respiratory: Positive for cough and shortness of breath. Negative for chest tightness and wheezing.   Cardiovascular: Negative for palpitations and leg swelling.  Gastrointestinal: Negative for nausea and vomiting.  Genitourinary: Negative for dysuria.  Musculoskeletal: Negative for joint swelling.  Skin: Negative for rash.  Neurological: Negative for headaches.  Hematological: Does not bruise/bleed easily.  Psychiatric/Behavioral: Negative for dysphoric mood. The patient is not nervous/anxious.        Objective:   Physical Exam  amb wf nad  Additional Exam: wt 137 February 20, 2009 > 138 March 20, 2009 >>138 April 29, 2009 > 136 May 13, 2009 > 138 August 18, 2009  > 01/02/2014 128  HEENT: nl dentition, turbinates, and orophanx. Nl external ear canals without cough reflex   NECK :  without JVD/Nodes/TM/ nl carotid upstrokes bilaterally   LUNGS: no acc muscle use, clear to A and P bilaterally without cough on insp or exp maneuvers   CV:  RRR  no s3 or murmur or increase in P2, no edema   ABD:  soft and nontender with nl excursion in the supine position. No bruits or organomegaly, bowel sounds nl  MS:  warm without deformities, calf tenderness, cyanosis or clubbing  SKIN: warm and dry without lesions    NEURO:  alert, approp, no deficits         CXR  12/28/13  Mediastinum and hilar structures are normal. Lungs are clear of  acute infiltrates. No pleural effusion or pneumothorax. Changes  suggesting COPD noted. Cardiomegaly with normal pulmonary  vascularity. Degenerative changes thoracic spine      Assessment & Plan:

## 2014-01-03 DIAGNOSIS — R05 Cough: Secondary | ICD-10-CM | POA: Insufficient documentation

## 2014-01-03 DIAGNOSIS — R058 Other specified cough: Secondary | ICD-10-CM | POA: Insufficient documentation

## 2014-01-03 NOTE — Assessment & Plan Note (Addendum)
The most common causes of chronic cough in immunocompetent adults include the following: upper airway cough syndrome (UACS), previously referred to as postnasal drip syndrome (PNDS), which is caused by variety of rhinosinus conditions; (2) asthma; (3) GERD; (4) chronic bronchitis from cigarette smoking or other inhaled environmental irritants; (5) nonasthmatic eosinophilic bronchitis; and (6) bronchiectasis.   These conditions, singly or in combination, have accounted for up to 94% of the causes of chronic cough in prospective studies.   Other conditions have constituted no >6% of the causes in prospective studies These have included bronchogenic carcinoma, chronic interstitial pneumonia, sarcoidosis, left ventricular failure, ACEI-induced cough, and aspiration from a condition associated with pharyngeal dysfunction.    Chronic cough is often simultaneously caused by more than one condition. A single cause has been found from 38 to 82% of the time, multiple causes from 18 to 62%. Multiply caused cough has been the result of three diseases up to 42% of the time.       Based on hx and exam, this is most likely:  Classic Upper airway cough syndrome, so named because it's frequently impossible to sort out how much is  CR/sinusitis with freq throat clearing (which can be related to primary GERD)   vs  causing  secondary (" extra esophageal")  GERD from wide swings in gastric pressure that occur with throat clearing, often  promoting self use of mint and menthol lozenges that reduce the lower esophageal sphincter tone and exacerbate the problem further in a cyclical fashion.   These are the same pts (now being labeled as having "irritable larynx syndrome" by some cough centers) who not infrequently have a history of having failed to tolerate ace inhibitors,  dry powder inhalers or biphosphonates or report having atypical reflux symptoms that don't respond to standard doses of PPI , and are easily confused as  having aecopd or asthma flares by even experienced allergists/ pulmonologists - all of which may be the case with her.   The first step is to maximize acid suppression  And use 1st gen H1 for pnds and then add reglan back next (short term trial)if persists as seemed to help previously.  Also try off cozar > For reasons that may related to vascular permability and nitric oxide pathways but not elevated  bradykinin levels (as seen with  ACEi use) losartan in the generic form has been reported now from mulitple sources  to cause a similar pattern of non-specific  upper airway symptoms as seen with acei.   This has not been reported with exposure to the other ARB's to date, so it seems reasonable for now to try low dose bystolic instead (see hbp).  See:  Lelon Frohlich Allergy Asthma Immunol  2008: 101: p [phone]

## 2014-01-03 NOTE — Assessment & Plan Note (Signed)
Strongly prefer in this setting: Bystolic, the most beta -1  selective Beta blocker available in sample form, with bisoprolol the most selective generic choice  on the market.   For now try bystolic 5 mg samples  Next step for this patient is med reconciliation before additonal empiric trials.  To keep things simple, I have asked the patient to first separate medicines that are perceived as maintenance, that is to be taken daily "no matter what", from those medicines that are taken on only on an as-needed basis and I have given the patient examples of both, and then return to see our NP to generate a  detailed  medication calendar which should be followed until the next physician sees the patient and updates it.

## 2014-01-16 ENCOUNTER — Ambulatory Visit (INDEPENDENT_AMBULATORY_CARE_PROVIDER_SITE_OTHER): Payer: Medicare Other | Admitting: Adult Health

## 2014-01-16 ENCOUNTER — Encounter: Payer: Self-pay | Admitting: Adult Health

## 2014-01-16 VITALS — BP 124/70 | HR 60 | Temp 97.6°F | Ht 63.5 in | Wt 127.0 lb

## 2014-01-16 DIAGNOSIS — R05 Cough: Secondary | ICD-10-CM

## 2014-01-16 DIAGNOSIS — R059 Cough, unspecified: Secondary | ICD-10-CM

## 2014-01-16 DIAGNOSIS — R058 Other specified cough: Secondary | ICD-10-CM

## 2014-01-16 MED ORDER — ESOMEPRAZOLE MAGNESIUM 40 MG PO CPDR: DELAYED_RELEASE_CAPSULE | ORAL | Status: DC

## 2014-01-16 NOTE — Progress Notes (Signed)
Subjective:    Patient ID: Chelsea Jordan, female    DOB: 03-11-1927  MRN: [id number]  HPI 49 yowf never smoker with cough onset in the 1970's and coughing daily since but much better p rx for UACS in 2010 aggravated by ACEI   referred back to pulmonary clinic 01/02/14 by Dr Etter Sjogren    History of Present Illness  February 20, 2009 ov with report cough waxes and wanes but typically gone while sleeping then resumes after standing up but also immediately on lying down. worse in ac if cold or in winter. coughs so hard she vomits. pnds seemed better with allegra with no impact on cough. no response to prednisone though only took for short course and not for cough.  [facility] 01/19/2009 and rx with PPI bid ac and zantac at bedtime still coughs so hard she vomits. off ace and fish oil since admit. minimal overall improvement  rec  continue nexium and zantac as before  stop allegra  if sensation of allergies or drainage use chlortrimeton 4 mg every 6h  reglan 10 mg take one before each meal and at bedtime   March 20, 2009 ov thinks the zantac made it worse, overall cough " no change since 1970's " except for "days she held the zantac" (note this was a recent addition) .   April 29, 2009--Presents for follow up and med review. Last visit, rx prednisone taper, chlor trimeton, cough control w/ meperidine (never took)Brought all meds for reveiw. Rx for reglan finished, no refills on bottle. Since last visit cough is much better, feels chlortrimeton is really helping. Cough is 75% better.  rec  no change rx x try off reglan  May 13, 2009 ov thinks chlortrimeton and tessilon work the best but happy that the cough is finally controlled after 40 years!  rec continue rx for gerd/ pnds/ f/u prn      01/02/2014 new consultation  ov/Wert re: recurrent cough x one year Chief Complaint  Patient presents with  . Pulmonary Consult    Referred by Dr. Etter Sjogren for bronchitis and abnormal  cxr per pt.   baseline cough tends to be every night when rollover x one year  Then much worse x 2 weeks daytime  with sense of chest congestion > mucus is clear, minimal production Prednisone helping some Already on nexium but not 1st gen H1 as before and not taking nexium consistently ac and not bid as before. No longer on reglan. On biaxin / pred started 4/25 for acute worsening cough in setting of nasal congestion with green mucus which cleared back to baseline mucus prod/ chronic cough p 5 days of rx. >> Was changed from losartan to a bystolic. Placed on Chlor-Trimeton for drainage  and Nexium for GERD.    01/16/2014 Follow up and Med review  Patient returns for a followup and medication review. Reviewed all her medications to organize them into a medication calendar with patient education. Appears that she is taking her medications correctly Last visit. Was changed from losartan to a bystolic. Placed on Chlor-Trimeton for drainage  and Nexium for GERD. MW pt here for new med calendar - pt brought all meds with her today.  Does feel some better but cough is not completely gone yet.  No chest pain, orthopnea, edema or fever.  Needs generic for bystolic   Current Medications, Allergies, Complete Past Medical History, Past Surgical History, Family History, and Social History were reviewed  in [employer] record.     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)  - [facility] eval 2009 ok  NEPHROLITHIASIS (ICD-592.0)  MENINGIOMA (ICD-225.2)  OSTEOARTHRITIS (ICD-715.90)  COUGH onset around 1980...........................................................Marland KitchenWert  - EGD c/w es dysmotility 09/29/07  - Gastric emptying study nl 07/26/07  - CT Chest nl 05/04/07  -Initial consult dictated Jan 19, 2009 try off biphosphonate/fish oil > rx ppi two times a day and zantac at hs  - February 20, 2009 add reglan 10 mg ac and hs x 1  month trial  Osteoporosis  --BMD -3.0 (06/13/09)- (intolerant to bisphosphanate) Reclast rx 07/28/2009  --vitamin d 66 (10/10)      Review of Systems  Constitutional: Negative for fever and unexpected weight change.  HENT: Positive for postnasal drip Negative for dental problem, nosebleeds, sneezing and trouble swallowing.   Eyes: Negative for redness and itching.  Respiratory: Positive for cough  Negative for chest tightness and wheezing.   Cardiovascular: Negative for palpitations and leg swelling.  Gastrointestinal: Negative for nausea and vomiting.  Genitourinary: Negative for dysuria.  Musculoskeletal: Negative for joint swelling.  Skin: Negative for rash.  Neurological: Negative for headaches.  Hematological: Does not bruise/bleed easily.  Psychiatric/Behavioral: Negative for dysphoric mood. The patient is not nervous/anxious.        Objective:   Physical Exam  amb wf nad  Additional Exam: wt 137 February 20, 2009 > 138 March 20, 2009 >>138 April 29, 2009 > 136 May 13, 2009 > 138 August 18, 2009  > 01/02/2014 128 >127 01/16/2014   HEENT: nl dentition, turbinates, and orophanx. Nl external ear canals without cough reflex   NECK :  without JVD/Nodes/TM/ nl carotid upstrokes bilaterally   LUNGS: no acc muscle use, clear to A and P bilaterally without cough on insp or exp maneuvers   CV:  RRR  no s3 or murmur or increase in P2, no edema   ABD:  soft and nontender with nl excursion in the supine position. No bruits or organomegaly, bowel sounds nl  MS:  warm without deformities, calf tenderness, cyanosis or clubbing  SKIN: warm and dry without lesions    NEURO:  alert, approp, no deficits         CXR  12/28/13  Mediastinum and hilar structures are normal. Lungs are clear of  acute infiltrates. No pleural effusion or pneumothorax. Changes  suggesting COPD noted. Cardiomegaly with normal pulmonary  vascularity. Degenerative changes thoracic spine       Assessment & Plan:

## 2014-01-16 NOTE — Patient Instructions (Signed)
Follow med calendar closely and bring to each visit.  Change Bystolic to Bisoprolol 5mg  daily  follow up Dr. Melvyn Novas  In 8 weeks and As needed

## 2014-01-18 ENCOUNTER — Telehealth: Payer: Self-pay | Admitting: Internal Medicine

## 2014-01-18 MED ORDER — NEBIVOLOL HCL 5 MG PO TABS: 5.0000 mg | ORAL_TABLET | Freq: Every day | ORAL | Status: DC

## 2014-01-18 NOTE — Telephone Encounter (Signed)
[id number] x1 

## 2014-01-18 NOTE — Telephone Encounter (Signed)
Called spoke w/ pt. She needed bystolic sent to [facility] for 90 day supply. Nothing further needed

## 2014-01-21 NOTE — Assessment & Plan Note (Signed)
Follow med calendar closely and bring to each visit.  Change Bystolic to Bisoprolol 5mg daily  follow up Dr. Wert  In 8 weeks and As needed    

## 2014-01-23 DIAGNOSIS — N200 Calculus of kidney: Secondary | ICD-10-CM | POA: Diagnosis not present

## 2014-01-23 DIAGNOSIS — N3020 Other chronic cystitis without hematuria: Secondary | ICD-10-CM | POA: Diagnosis not present

## 2014-01-25 NOTE — Addendum Note (Signed)
Addended by: Parke Poisson E on: 01/25/2014 08:56 AM   Modules accepted: Orders

## 2014-02-04 DIAGNOSIS — R109 Unspecified abdominal pain: Secondary | ICD-10-CM | POA: Diagnosis not present

## 2014-02-04 DIAGNOSIS — R310 Gross hematuria: Secondary | ICD-10-CM | POA: Diagnosis not present

## 2014-02-04 DIAGNOSIS — N3020 Other chronic cystitis without hematuria: Secondary | ICD-10-CM | POA: Diagnosis not present

## 2014-02-04 DIAGNOSIS — R350 Frequency of micturition: Secondary | ICD-10-CM | POA: Diagnosis not present

## 2014-02-18 ENCOUNTER — Encounter: Payer: Self-pay | Admitting: Internal Medicine

## 2014-03-12 ENCOUNTER — Encounter: Payer: Self-pay | Admitting: Internal Medicine

## 2014-03-12 ENCOUNTER — Encounter (INDEPENDENT_AMBULATORY_CARE_PROVIDER_SITE_OTHER): Payer: Self-pay

## 2014-03-12 ENCOUNTER — Ambulatory Visit (INDEPENDENT_AMBULATORY_CARE_PROVIDER_SITE_OTHER)
Admission: RE | Admit: 2014-03-12 | Discharge: 2014-03-12 | Disposition: A | Discharge status: Home / Self Care | Payer: Medicare Other | Source: Ambulatory Visit | Attending: Internal Medicine | Admitting: Internal Medicine

## 2014-03-12 ENCOUNTER — Ambulatory Visit (INDEPENDENT_AMBULATORY_CARE_PROVIDER_SITE_OTHER): Payer: Medicare Other | Admitting: Internal Medicine

## 2014-03-12 ENCOUNTER — Ambulatory Visit: Payer: Medicare Other | Admitting: Internal Medicine

## 2014-03-12 VITALS — BP 160/76 | HR 60 | Temp 98.1°F | Ht 63.5 in | Wt 125.4 lb

## 2014-03-12 DIAGNOSIS — R059 Cough, unspecified: Secondary | ICD-10-CM | POA: Diagnosis not present

## 2014-03-12 DIAGNOSIS — N390 Urinary tract infection, site not specified: Secondary | ICD-10-CM | POA: Diagnosis not present

## 2014-03-12 DIAGNOSIS — R05 Cough: Secondary | ICD-10-CM

## 2014-03-12 DIAGNOSIS — I10 Essential (primary) hypertension: Secondary | ICD-10-CM | POA: Diagnosis not present

## 2014-03-12 DIAGNOSIS — R058 Other specified cough: Secondary | ICD-10-CM

## 2014-03-12 NOTE — Progress Notes (Signed)
Quick Note:  LMTCB ______ 

## 2014-03-12 NOTE — Assessment & Plan Note (Signed)
-   trial off cozar 01/03/2014  -med calendar 01/16/14 > not using it 03/12/2014   Better to her satisfaction and mostly concerned at this point with non-acid gerd/ irritable larynx syndrome and note she has abn EGD 2009 by Carlean Purl suggesting dysmotility so doubt any active pulmonary component and we'll defer to Dr Basilio Cairo issue of referrals to GI or ENT   Pulmonary f/u can be prn >

## 2014-03-12 NOTE — Progress Notes (Signed)
Subjective:    Patient ID: Chelsea Jordan, female    DOB: 09-Aug-1927  MRN: [id number]   Urology Dr Vikki Ports  Brief patient profile:  89 yowf never smoker with cough onset in the 1970's and coughing daily since but much better p rx for UACS in 2010 aggravated by ACEI   referred back to pulmonary clinic 01/02/14 by Dr Etter Sjogren    History of Present Illness  February 20, 2009 ov with report cough waxes and wanes but typically gone while sleeping then resumes after standing up but also immediately on lying down. worse in ac if cold or in winter. coughs so hard she vomits. pnds seemed better with allegra with no impact on cough. no response to prednisone though only took for short course and not for cough.  Seen during admit to [facility] in consult 01/19/2009 and rx with PPI bid ac and zantac at bedtime still coughs so hard she vomits. off ace and fish oil since admit. minimal overall improvement  rec  continue nexium and zantac as before  stop allegra  if sensation of allergies or drainage use chlortrimeton 4 mg every 6h  reglan 10 mg take one before each meal and at bedtime   March 20, 2009 ov thinks the zantac made it worse, overall cough " no change since 1970's " except for "days she held the zantac" (note this was a recent addition) .   April 29, 2009--Presents for follow up and med review. Last visit, rx prednisone taper, chlor trimeton, cough control w/ meperidine (never took)Brought all meds for reveiw. Rx for reglan finished, no refills on bottle. Since last visit cough is much better, feels chlortrimeton is really helping. Cough is 75% better.  rec  no change rx x try off reglan  May 13, 2009 ov thinks chlortrimeton and tessilon work the best but happy that the cough is finally controlled after 40 years!  rec continue rx for gerd/ pnds/ f/u prn      01/02/2014 new consultation  ov/Chelsea Jordan re: recurrent cough x one year Chief Complaint  Patient presents with  . Pulmonary Consult     Referred by Dr. Etter Sjogren for bronchitis and abnormal cxr per pt.   baseline cough tends to be every night when rollover x one year  Then much worse x 2 weeks daytime  with sense of chest congestion > mucus is clear, minimal production Prednisone helping some Already on nexium but not 1st gen H1 as before and not taking nexium consistently ac and not bid as before. No longer on reglan. On biaxin / pred started 4/25 for acute worsening cough in setting of nasal congestion with green mucus which cleared back to baseline mucus prod/ chronic cough p 5 days of rx. >> Was changed from losartan to a bystolic. Placed on Chlor-Trimeton for drainage  and Nexium for GERD.    01/16/2014 Follow up and Med review  Patient returns for a followup and medication review. Reviewed all her medications to organize them into a medication calendar with patient education. Appears that she is taking her medications correctly Last visit. Was changed from losartan to a bystolic. Placed on Chlor-Trimeton for drainage  and Nexium for GERD. MW pt here for new med calendar - pt brought all meds with her today.  Does feel some better but cough is not completely gone yet.  No chest pain, orthopnea, edema or fever.  Needs generic for bystolic  rec Follow med calendar closely and bring to each visit.  Change Bystolic to Bisoprolol 5mg  daily     03/12/2014 f/u ov/Chelsea Jordan re: chronic recurrent x years  cough/ confused with names of meds and doctors/ no med calendar / now on [employer] Complaint  Patient presents with  . Follow-up    Pt states that her cough "sounds different" since last visit. Cough is non prod. She c/o dysphagia for the past 2 months- feels like pills are getting stuck in her throat.     Still fills cough comes on if swallows wrong (points to R side of neck where "things stick") despite nexium  Not limited by breathing from desired activities    No obvious day to day or daytime variabilty or assoc  chronic cough or cp or chest tightness, subjective wheeze overt sinus or hb symptoms. No unusual exp hx or h/o childhood pna/ asthma or knowledge of premature birth.  Sleeping ok without nocturnal  or early am exacerbation  of respiratory  c/o's or need for noct saba. Also denies any obvious fluctuation of symptoms with weather or environmental changes or other aggravating or alleviating factors except as outlined above   Current Medications, Allergies, Complete Past Medical History, Past Surgical History, Family History, and Social History were reviewed in [employer] record.  ROS  The following are not active complaints unless bolded sore throat, dysphagia, dental problems, itching, sneezing,  nasal congestion or excess/ purulent secretions, ear ache,   fever, chills, sweats, unintended wt loss, pleuritic or exertional cp, hemoptysis,  orthopnea pnd or leg swelling, presyncope, palpitations, heartburn, abdominal pain, anorexia, nausea, vomiting, diarrhea  or change in bowel or urinary habits, change in stools or urine, dysuria,hematuria,  rash, arthralgias, visual complaints, headache, numbness weakness or ataxia or problems with walking or coordination,  change in mood/affect or memory.             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)  - [facility] eval 2009 ok  NEPHROLITHIASIS (ICD-592.0)  MENINGIOMA (ICD-225.2)  OSTEOARTHRITIS (ICD-715.90)  COUGH onset around 1980...........................................................Marland KitchenWert  - EGD c/w es dysmotility 09/29/07  - Gastric emptying study nl 07/26/07  - CT Chest nl 05/04/07  -Initial consult dictated Jan 19, 2009 try off biphosphonate/fish oil > rx ppi two times a day and zantac at hs  - February 20, 2009 add reglan 10 mg ac and hs x 1 month trial  Osteoporosis  --BMD -3.0 (06/13/09)- (intolerant to bisphosphanate) Reclast rx 07/28/2009  --vitamin d 66 (10/10)             Objective:   Physical Exam  amb wf nad with occ throat clearing  Additional Exam: wt 137 February 20, 2009 > 138 March 20, 2009 >>138 April 29, 2009 > 136 May 13, 2009 > 138 August 18, 2009  > 01/02/2014 128 >127 01/16/2014 > 03/12/2014 125   HEENT: nl dentition, turbinates, and orophanx. Nl external ear canals without cough reflex   NECK :  without JVD/Nodes/TM/ nl carotid upstrokes bilaterally   LUNGS: no acc muscle use, clear to A and P bilaterally without cough on insp or exp maneuvers   CV:  RRR  no s3 or murmur or increase in P2, no edema   ABD:  soft and nontender with nl excursion in the supine position. No bruits or organomegaly, bowel sounds nl  MS:  warm without deformities, calf tenderness, cyanosis or clubbing  SKIN: warm and dry without lesions  CXR  03/12/2014 :  Mediastinum hilar structures normal. Cardiomegaly with normal pulmonary vascularity. Mild increased density over the lung bases stable, most likely overlying soft tissues. No focal infiltrate. No pleural effusion or pneumothorax. Apical pleural thickening noted consistent with scarring. No acute bony abnormality.       Assessment & Plan:

## 2014-03-12 NOTE — Assessment & Plan Note (Signed)
On macrodantin with chronic non-specific pulmonary complaints which are problematic in terms of early signs of macrodantin toxicity which badly overlaps with present symptoms; however, no evidence of ILD at present so ok to continue with caution

## 2014-03-12 NOTE — Patient Instructions (Signed)
We may need you to stop nitrofurantoin if breathing or cough get worse  Please remember to go to the x-ray department downstairs for your tests - we will call you with the results when they are available.   If you are satisfied with your treatment plan,  let your doctor know and he/she can either refill your medications or you can return here when your prescription runs out.     If in any way you are not 100% satisfied,  please tell us.  If 100% better, tell your friends!  Pulmonary follow up is as needed

## 2014-03-12 NOTE — Assessment & Plan Note (Signed)
Adequate control on present rx, reviewed > no change in rx needed   - avoiding cozar  due to concern with effect on upper airway

## 2014-03-13 ENCOUNTER — Telehealth: Payer: Self-pay | Admitting: Internal Medicine

## 2014-03-13 NOTE — Telephone Encounter (Signed)
Call pt: Reviewed cxr and no acute change so no change in recommendations made at ov ---  lmomtcb x1

## 2014-03-13 NOTE — Telephone Encounter (Signed)
[id number] x1 

## 2014-03-13 NOTE — Telephone Encounter (Signed)
Pt returning call.Chelsea Jordan ° °

## 2014-03-13 NOTE — Telephone Encounter (Signed)
Pt advised. Sonya Gunnoe, CMA  

## 2014-03-18 DIAGNOSIS — N3020 Other chronic cystitis without hematuria: Secondary | ICD-10-CM | POA: Diagnosis not present

## 2014-03-18 DIAGNOSIS — R350 Frequency of micturition: Secondary | ICD-10-CM | POA: Diagnosis not present

## 2014-04-04 ENCOUNTER — Encounter: Payer: Self-pay | Admitting: Internal Medicine

## 2014-04-05 ENCOUNTER — Encounter: Payer: Self-pay | Admitting: Gastroenterology

## 2014-04-24 ENCOUNTER — Encounter: Payer: Self-pay | Admitting: Internal Medicine

## 2014-04-24 ENCOUNTER — Ambulatory Visit (INDEPENDENT_AMBULATORY_CARE_PROVIDER_SITE_OTHER): Payer: Medicare Other | Admitting: Internal Medicine

## 2014-04-24 VITALS — BP 148/80 | HR 64 | Ht 63.5 in | Wt 123.2 lb

## 2014-04-24 DIAGNOSIS — R1314 Dysphagia, pharyngoesophageal phase: Secondary | ICD-10-CM

## 2014-04-24 NOTE — Patient Instructions (Signed)
You have been scheduled for a Barium Esophogram at [facility] (1st floor of the hospital) on 04/29/14 at 10:00am. Please arrive 15 minutes prior to your appointment for registration. Make certain not to have anything to eat or drink 3 hours prior to your test. If you need to reschedule for any reason, please contact radiology at [phone] to do so. __________________________________________________________________ A barium swallow is an examination that concentrates on views of the esophagus. This tends to be a double contrast exam (barium and two liquids which, when combined, create a gas to distend the wall of the oesophagus) or single contrast (non-ionic iodine based). The study is usually tailored to your symptoms so a good history is essential. Attention is paid during the study to the form, structure and configuration of the esophagus, looking for functional disorders (such as aspiration, dysphagia, achalasia, motility and reflux) EXAMINATION You may be asked to change into a gown, depending on the type of swallow being performed. A radiologist and radiographer will perform the procedure. The radiologist will advise you of the type of contrast selected for your procedure and direct you during the exam. You will be asked to stand, sit or lie in several different positions and to hold a small amount of fluid in your mouth before being asked to swallow while the imaging is performed .In some instances you may be asked to swallow barium coated marshmallows to assess the motility of a solid food bolus. The exam can be recorded as a digital or video fluoroscopy procedure. POST PROCEDURE It will take 1-2 days for the barium to pass through your system. To facilitate this, it is important, unless otherwise directed, to increase your fluids for the next 24-48hrs and to resume your normal diet.  This test typically takes about 30 minutes to  perform. __________________________________________________________________________________  I appreciate the opportunity to care for you.

## 2014-04-24 NOTE — Progress Notes (Signed)
[facility]  Chelsea Jordan    [id number]    1927/08/23    Assessment and Plan/Recommendations:  1. Pill Dysphagia: History of dysphagia with dilatation but problems primarily with pills this time. Recommend Esophagram with tablet for further evaluation.  May need EGD if progressive, but pt seems to have had some relief by cutting pills and adding to food.  2. GERD: Managed well on Nexium once a day.  No changes needed   HPI: Chelsea Jordan is a 80 y.o. with a history of dysphagia with dilation in 2009. Pt states for the last 4 months she has had dysphagia to pills.  She describes this as a sticking sensation behind her cricoid that can last up to 30-40 minutes after taking a medicine that is sometimes relieved with liquids. It is worse with her larger pills, her calcium and multivitamin, but sometimes occurs with her smaller pills as well.  Pt denies dysphagia to solid foods, but states liquids do sometimes cause her to "choke up".  She denies lying down after taking her pills.  She states she has had 10 lbs of weight loss over the past year.  She will also occasionally wake up with ABD pain from an incisional hernia from her cholecystectomy scar, but she has a chronic history of this and it doesn't bother her often.  She denies N/V, diarrhea, constipation, fever, chills, weakness, fatigue or night sweats.   Medications, allergies, past medical history, past surgical history, family history and social history are reviewed and updated in the EMR.  Review of systems: Positive for: Dysphagia, ABD pain All other ROS negative or as per HPI  Physical Exam: BP 148/80  Pulse 64  Ht 5' 3.5" (1.613 m)  Wt 123 lb 3.2 oz (55.883 kg)  BMI 21.48 kg/m2 Constitutional: WDWN NAD Eyes: anicteric Mouth: oral and posterior pharynx free of lesions Neck: supple, no mass or thyromegaly. Without lymphadenopathy Lungs: clear to auscultation bilaterally, unlabored and even. Cardiovascular: S1S2 with  regular rate and rhythm, no rubs murmurs or gallops noted. Abdomen: soft, nontender, nondistended, no masses or organomegaly, normal bowel sounds. Small knot at superior portion of cholecystectomy incision, but no hernia appreciated on coughing or neck flexion while supine. Extremities: no lower extremity edema, 2+ radial pulse Skin: no rash Neuro: alert and oriented x 3 Psych: normal mood and affect  Data Reviewed: Previous pt records, labs, and EGD results reviewed.  Legrand Como A. Calvin, [place] 04/24/2014 8:47 AM    I have personally seen the patient, reviewed and repeated key elements of the history and physical and participated in formation of the assessment and plan the student has documented.  Gatha Mayer, MD, [facility]

## 2014-04-29 ENCOUNTER — Telehealth: Payer: Self-pay

## 2014-04-29 ENCOUNTER — Ambulatory Visit (HOSPITAL_COMMUNITY)
Admission: RE | Admit: 2014-04-29 | Discharge: 2014-04-29 | Disposition: A | Discharge status: Home / Self Care | Payer: Medicare Other | Source: Ambulatory Visit | Attending: Internal Medicine | Admitting: Internal Medicine

## 2014-04-29 DIAGNOSIS — K449 Diaphragmatic hernia without obstruction or gangrene: Secondary | ICD-10-CM | POA: Insufficient documentation

## 2014-04-29 DIAGNOSIS — R933 Abnormal findings on diagnostic imaging of other parts of digestive tract: Secondary | ICD-10-CM | POA: Diagnosis not present

## 2014-04-29 DIAGNOSIS — R1314 Dysphagia, pharyngoesophageal phase: Secondary | ICD-10-CM | POA: Diagnosis present

## 2014-04-29 DIAGNOSIS — K2289 Other specified disease of esophagus: Secondary | ICD-10-CM | POA: Diagnosis not present

## 2014-04-29 DIAGNOSIS — K219 Gastro-esophageal reflux disease without esophagitis: Secondary | ICD-10-CM | POA: Diagnosis not present

## 2014-04-29 DIAGNOSIS — K228 Other specified diseases of esophagus: Secondary | ICD-10-CM | POA: Diagnosis not present

## 2014-04-29 NOTE — Progress Notes (Signed)
Quick Note:  Please let her know that it appears something in the chest is pushing on her esophagus Next step is CT chest with contrast - will need BMET before doing that ______

## 2014-04-29 NOTE — Telephone Encounter (Signed)
Pt called and stated that she has not had the Prolia injections in a long time due to dental work and jaw surgery.   Wants to start the injections again.

## 2014-04-30 ENCOUNTER — Other Ambulatory Visit: Payer: Self-pay

## 2014-04-30 DIAGNOSIS — K222 Esophageal obstruction: Secondary | ICD-10-CM

## 2014-04-30 DIAGNOSIS — K9433 Esophagostomy malfunction: Secondary | ICD-10-CM

## 2014-04-30 DIAGNOSIS — R933 Abnormal findings on diagnostic imaging of other parts of digestive tract: Secondary | ICD-10-CM

## 2014-04-30 DIAGNOSIS — K229 Disease of esophagus, unspecified: Secondary | ICD-10-CM

## 2014-04-30 DIAGNOSIS — K9430 Esophagostomy complications, unspecified: Secondary | ICD-10-CM

## 2014-04-30 NOTE — Telephone Encounter (Signed)
Ok to schedule nurse visit for same once insurance approves med (check with .Marland Kitchen?Mariella Saa can advise if I have the wrong name) thanks

## 2014-05-01 ENCOUNTER — Other Ambulatory Visit (INDEPENDENT_AMBULATORY_CARE_PROVIDER_SITE_OTHER): Payer: Medicare Other

## 2014-05-01 ENCOUNTER — Other Ambulatory Visit: Payer: Self-pay | Admitting: Physician Assistant

## 2014-05-01 DIAGNOSIS — R933 Abnormal findings on diagnostic imaging of other parts of digestive tract: Secondary | ICD-10-CM | POA: Diagnosis not present

## 2014-05-01 DIAGNOSIS — C44721 Squamous cell carcinoma of skin of unspecified lower limb, including hip: Secondary | ICD-10-CM | POA: Diagnosis not present

## 2014-05-01 LAB — CREATININE, SERUM: Creatinine, Ser: 0.5 mg/dL (ref 0.4–1.2)

## 2014-05-01 LAB — BUN: BUN: 14 mg/dL (ref 6–23)

## 2014-05-01 NOTE — Telephone Encounter (Signed)
Does this pt need to have another insurance approval for prolia

## 2014-05-03 ENCOUNTER — Ambulatory Visit (INDEPENDENT_AMBULATORY_CARE_PROVIDER_SITE_OTHER)
Admission: RE | Admit: 2014-05-03 | Discharge: 2014-05-03 | Disposition: A | Discharge status: Home / Self Care | Payer: Medicare Other | Source: Ambulatory Visit | Attending: Internal Medicine | Admitting: Internal Medicine

## 2014-05-03 DIAGNOSIS — K9433 Esophagostomy malfunction: Secondary | ICD-10-CM | POA: Diagnosis not present

## 2014-05-03 DIAGNOSIS — K222 Esophageal obstruction: Secondary | ICD-10-CM | POA: Diagnosis not present

## 2014-05-03 DIAGNOSIS — I714 Abdominal aortic aneurysm, without rupture, unspecified: Secondary | ICD-10-CM | POA: Diagnosis not present

## 2014-05-03 DIAGNOSIS — D485 Neoplasm of uncertain behavior of skin: Secondary | ICD-10-CM | POA: Diagnosis not present

## 2014-05-03 DIAGNOSIS — R933 Abnormal findings on diagnostic imaging of other parts of digestive tract: Secondary | ICD-10-CM | POA: Diagnosis not present

## 2014-05-03 DIAGNOSIS — K9430 Esophagostomy complications, unspecified: Secondary | ICD-10-CM

## 2014-05-03 DIAGNOSIS — K229 Disease of esophagus, unspecified: Secondary | ICD-10-CM

## 2014-05-03 DIAGNOSIS — IMO0002 Reserved for concepts with insufficient information to code with codable children: Secondary | ICD-10-CM | POA: Diagnosis not present

## 2014-05-03 DIAGNOSIS — R222 Localized swelling, mass and lump, trunk: Secondary | ICD-10-CM | POA: Diagnosis not present

## 2014-05-03 MED ORDER — IOHEXOL 300 MG/ML  SOLN
80.0000 mL | Freq: Once | INTRAMUSCULAR | Status: AC | PRN
  Administered 2014-05-03: 80 mL via INTRAVENOUS

## 2014-05-03 NOTE — Progress Notes (Signed)
Quick Note:  I called results to her Will have Dr. Ardis Hughs review re: candidate for EUS? ______

## 2014-05-07 NOTE — Telephone Encounter (Signed)
Yes, she does need another insurance approval and I have submitted her info electronically to [employer] for insurance verification.  I will notify you as soon as I have a response. Thank you.

## 2014-05-08 ENCOUNTER — Encounter (HOSPITAL_COMMUNITY): Payer: Self-pay | Admitting: *Deleted

## 2014-05-08 ENCOUNTER — Other Ambulatory Visit: Payer: Self-pay

## 2014-05-08 ENCOUNTER — Telehealth: Payer: Self-pay | Admitting: Gastroenterology

## 2014-05-08 DIAGNOSIS — K228 Other specified diseases of esophagus: Secondary | ICD-10-CM

## 2014-05-08 DIAGNOSIS — K2289 Other specified disease of esophagus: Secondary | ICD-10-CM

## 2014-05-08 NOTE — Telephone Encounter (Signed)
Pt had questions regarding the EUS. Pt wanted to know what would happen during the procedure. Pts questions answered.

## 2014-05-09 ENCOUNTER — Encounter (HOSPITAL_COMMUNITY): Payer: Self-pay | Admitting: Pharmacy Technician

## 2014-05-10 ENCOUNTER — Telehealth: Payer: Self-pay | Admitting: Gastroenterology

## 2014-05-10 NOTE — Telephone Encounter (Signed)
Spoke with pt and discussed with her that a letter appears to have been mailed to her from [place] with instructions. Reviewed instructions with pt.

## 2014-05-10 NOTE — Telephone Encounter (Signed)
I have rec'd pt's Prolia insurance verification.  Chelsea Jordan will pay approximately 80% of the admin and Prolia, the remaining 20%, which is approximately $180 will be picked up by her secondary and her estimated responsibility will be 2% of the $180, which means her estimated responsibility will be approximately $4. Please make pt aware this is an estimate and we will not know the exact amt until both of her insurances have paid. I have sent a copy of the summary of benefits to be scanned into pt's chart. Please let me know if you have any questions. Thank you.

## 2014-05-15 NOTE — Anesthesia Preprocedure Evaluation (Addendum)
Anesthesia Evaluation  Patient identified by MRN, date of birth, ID band Patient awake    Reviewed: Allergy & Precautions, H&P , NPO status , Patient's Chart, lab work & pertinent test results  History of Anesthesia Complications (+) PONV and history of anesthetic complications  Airway Mallampati: III TM Distance: >3 FB Neck ROM: Full    Dental  (+) Edentulous Lower, Caps   Pulmonary asthma ,  breath sounds clear to auscultation  Pulmonary exam normal       Cardiovascular Exercise Tolerance: Good hypertension, Pt. on medications negative cardio ROS  + Valvular Problems/Murmurs MVP Rhythm:Regular Rate:Normal  Plays golf regularly without chest pain, SOB, symptoms    Neuro/Psych PSYCHIATRIC DISORDERS Anxiety  Neuromuscular disease negative neurological ROS  negative psych ROS   GI/Hepatic negative GI ROS, Neg liver ROS, hiatal hernia, GERD-  Medicated and Controlled,  Endo/Other  negative endocrine ROS  Renal/GU negative Renal ROS  negative genitourinary   Musculoskeletal negative musculoskeletal ROS (+) Arthritis -, Osteoarthritis,    Abdominal   Peds negative pediatric ROS (+)  Hematology negative hematology ROS (+)   Anesthesia Other Findings   Reproductive/Obstetrics negative OB ROS                          Anesthesia Physical Anesthesia Plan  ASA: II  Anesthesia Plan: MAC   Post-op Pain Management:    Induction: Intravenous  Airway Management Planned: Nasal Cannula  Additional Equipment:   Intra-op Plan:   Post-operative Plan:   Informed Consent: I have reviewed the patients History and Physical, chart, labs and discussed the procedure including the risks, benefits and alternatives for the proposed anesthesia with the patient or authorized representative who has indicated his/her understanding and acceptance.   Dental advisory given  Plan Discussed with:  CRNA  Anesthesia Plan Comments:        Anesthesia Quick Evaluation

## 2014-05-15 NOTE — Telephone Encounter (Signed)
Called pt and informed of approximate responsibility.   Ellison Hughs,  Can we order a prolia for pt? She has appointment on 10/20 with PCP?

## 2014-05-16 ENCOUNTER — Encounter (HOSPITAL_COMMUNITY): Payer: Medicare Other | Admitting: Anesthesiology

## 2014-05-16 ENCOUNTER — Encounter (HOSPITAL_COMMUNITY)
Admission: RE | Disposition: A | Discharge status: Home / Self Care | Payer: Self-pay | Source: Ambulatory Visit | Attending: Gastroenterology

## 2014-05-16 ENCOUNTER — Ambulatory Visit (HOSPITAL_COMMUNITY)
Admission: RE | Admit: 2014-05-16 | Discharge: 2014-05-16 | Disposition: A | Discharge status: Home / Self Care | Payer: Medicare Other | Source: Ambulatory Visit | Attending: Gastroenterology | Admitting: Gastroenterology

## 2014-05-16 ENCOUNTER — Encounter (HOSPITAL_COMMUNITY): Payer: Self-pay | Admitting: *Deleted

## 2014-05-16 ENCOUNTER — Ambulatory Visit (HOSPITAL_COMMUNITY): Payer: Medicare Other | Admitting: Anesthesiology

## 2014-05-16 DIAGNOSIS — J45909 Unspecified asthma, uncomplicated: Secondary | ICD-10-CM | POA: Insufficient documentation

## 2014-05-16 DIAGNOSIS — D214 Benign neoplasm of connective and other soft tissue of abdomen: Secondary | ICD-10-CM | POA: Diagnosis not present

## 2014-05-16 DIAGNOSIS — I10 Essential (primary) hypertension: Secondary | ICD-10-CM | POA: Diagnosis not present

## 2014-05-16 DIAGNOSIS — F411 Generalized anxiety disorder: Secondary | ICD-10-CM | POA: Insufficient documentation

## 2014-05-16 DIAGNOSIS — K229 Disease of esophagus, unspecified: Secondary | ICD-10-CM | POA: Diagnosis not present

## 2014-05-16 DIAGNOSIS — I059 Rheumatic mitral valve disease, unspecified: Secondary | ICD-10-CM | POA: Diagnosis not present

## 2014-05-16 DIAGNOSIS — M1990 Unspecified osteoarthritis, unspecified site: Secondary | ICD-10-CM | POA: Diagnosis not present

## 2014-05-16 DIAGNOSIS — K228 Other specified diseases of esophagus: Secondary | ICD-10-CM

## 2014-05-16 DIAGNOSIS — R011 Cardiac murmur, unspecified: Secondary | ICD-10-CM | POA: Insufficient documentation

## 2014-05-16 DIAGNOSIS — K219 Gastro-esophageal reflux disease without esophagitis: Secondary | ICD-10-CM | POA: Insufficient documentation

## 2014-05-16 DIAGNOSIS — D4819 Other specified neoplasm of uncertain behavior of connective and other soft tissue: Secondary | ICD-10-CM | POA: Diagnosis not present

## 2014-05-16 DIAGNOSIS — K449 Diaphragmatic hernia without obstruction or gangrene: Secondary | ICD-10-CM | POA: Diagnosis not present

## 2014-05-16 DIAGNOSIS — D481 Neoplasm of uncertain behavior of connective and other soft tissue: Secondary | ICD-10-CM | POA: Insufficient documentation

## 2014-05-16 DIAGNOSIS — K2289 Other specified disease of esophagus: Secondary | ICD-10-CM

## 2014-05-16 HISTORY — DX: Malignant (primary) neoplasm, unspecified: C80.1

## 2014-05-16 HISTORY — DX: Other complications of anesthesia, initial encounter: T88.59XA

## 2014-05-16 HISTORY — DX: Adverse effect of unspecified anesthetic, initial encounter: T41.45XA

## 2014-05-16 HISTORY — PX: EUS: SHX5427

## 2014-05-16 SURGERY — UPPER ENDOSCOPIC ULTRASOUND (EUS) LINEAR
Anesthesia: Monitor Anesthesia Care

## 2014-05-16 MED ORDER — PROPOFOL 10 MG/ML IV BOLUS
INTRAVENOUS | Status: AC
  Filled 2014-05-16: qty 40

## 2014-05-16 MED ORDER — ONDANSETRON HCL 4 MG/2ML IJ SOLN
INTRAMUSCULAR | Status: DC | PRN
  Administered 2014-05-16: 4 mg via INTRAVENOUS

## 2014-05-16 MED ORDER — PROPOFOL INFUSION 10 MG/ML OPTIME
INTRAVENOUS | Status: DC | PRN
  Administered 2014-05-16: 50 ug/kg/min via INTRAVENOUS

## 2014-05-16 MED ORDER — DEXAMETHASONE SODIUM PHOSPHATE 10 MG/ML IJ SOLN
INTRAMUSCULAR | Status: DC | PRN
  Administered 2014-05-16: 10 mg via INTRAVENOUS

## 2014-05-16 MED ORDER — FENTANYL CITRATE 0.05 MG/ML IJ SOLN
INTRAMUSCULAR | Status: AC
  Filled 2014-05-16: qty 5

## 2014-05-16 MED ORDER — FENTANYL CITRATE 0.05 MG/ML IJ SOLN
INTRAMUSCULAR | Status: DC | PRN
  Administered 2014-05-16 (×5): 50 ug via INTRAVENOUS

## 2014-05-16 MED ORDER — PHENYLEPHRINE HCL 10 MG/ML IJ SOLN
INTRAMUSCULAR | Status: DC | PRN
  Administered 2014-05-16 (×2): 40 ug via INTRAVENOUS

## 2014-05-16 MED ORDER — SODIUM CHLORIDE 0.9 % IV SOLN: INTRAVENOUS | Status: DC

## 2014-05-16 MED ORDER — LACTATED RINGERS IV SOLN
INTRAVENOUS | Status: DC
  Administered 2014-05-16: 12:00:00 via INTRAVENOUS
  Administered 2014-05-16: 1000 mL via INTRAVENOUS

## 2014-05-16 MED ORDER — PROPOFOL 10 MG/ML IV BOLUS
INTRAVENOUS | Status: DC | PRN
  Administered 2014-05-16 (×2): 30 mg via INTRAVENOUS
  Administered 2014-05-16: 10 mg via INTRAVENOUS
  Administered 2014-05-16: 30 mg via INTRAVENOUS

## 2014-05-16 MED ORDER — GLYCOPYRROLATE 0.2 MG/ML IJ SOLN
INTRAMUSCULAR | Status: DC | PRN
  Administered 2014-05-16: 0.2 mg via INTRAVENOUS

## 2014-05-16 MED ORDER — ESMOLOL HCL 10 MG/ML IV SOLN
INTRAVENOUS | Status: DC | PRN
  Administered 2014-05-16: 10 mg via INTRAVENOUS

## 2014-05-16 NOTE — Transfer of Care (Signed)
Immediate Anesthesia Transfer of Care Note  Patient: Chelsea Jordan  Procedure(s) Performed: Procedure(s): UPPER ENDOSCOPIC ULTRASOUND (EUS) LINEAR (N/A)  Patient Location: Endo Recovery  Anesthesia Type:MAC  Level of Consciousness: Patient easily awoken, sedated, comfortable, cooperative, following commands, responds to stimulation.   Airway & Oxygen Therapy: Patient spontaneously breathing, ventilating well, oxygen via simple oxygen mask.  Post-op Assessment: Report given to PACU RN, vital signs reviewed and stable, moving all extremities.   Post vital signs: Reviewed and stable.  Complications: No apparent anesthesia complications

## 2014-05-16 NOTE — Telephone Encounter (Signed)
Prolia ordered for patient.

## 2014-05-16 NOTE — Anesthesia Postprocedure Evaluation (Signed)
  Anesthesia Post-op Note  Patient: Chelsea Jordan  Procedure(s) Performed: Procedure(s) (LRB): UPPER ENDOSCOPIC ULTRASOUND (EUS) LINEAR (N/A)  Patient Location: PACU  Anesthesia Type: MAC  Level of Consciousness: awake and alert   Airway and Oxygen Therapy: Patient Spontanous Breathing  Post-op Pain: mild  Post-op Assessment: Post-op Vital signs reviewed, Patient's Cardiovascular Status Stable, Respiratory Function Stable, Patent Airway and No signs of Nausea or vomiting  Last Vitals:  Filed Vitals:   05/16/14 1320  BP: 158/72  Pulse:   Temp:   Resp: 17    Post-op Vital Signs: stable   Complications: No apparent anesthesia complications

## 2014-05-16 NOTE — Interval H&P Note (Signed)
History and Physical Interval Note:  05/16/2014 12:07 PM  Chelsea Jordan  has presented today for surgery, with the diagnosis of Esophageal mass [530.9]  The various methods of treatment have been discussed with the patient and family. After consideration of risks, benefits and other options for treatment, the patient has consented to  Procedure(s): UPPER ENDOSCOPIC ULTRASOUND (EUS) LINEAR (N/A) as a surgical intervention .  The patient's history has been reviewed, patient examined, no change in status, stable for surgery.  I have reviewed the patient's chart and labs.  Questions were answered to the patient's satisfaction.     Milus Banister

## 2014-05-16 NOTE — Op Note (Signed)
[facility] [place], [address]   ENDOSCOPIC ULTRASOUND PROCEDURE REPORT  PATIENT: Chelsea Jordan, Chelsea Jordan  MR#: [id number] BIRTHDATE: 10-23-1926  GENDER: Female ENDOSCOPIST: Milus Banister, MD REFERRED BY:  Gatha Mayer, M.D, [facility] PROCEDURE DATE:  05/16/2014 PROCEDURE:   Upper EUS w/FNA ASA CLASS:      Class III INDICATIONS:   paraesophageal mass on CT, probably not changed since CT 2014; the mass is causing extrinsic esophageal compression, probably also mild pill associated dysphagia. MEDICATIONS: MAC sedation, administered by CRNA  DESCRIPTION OF PROCEDURE:   After the risks benefits and alternatives of the procedure were  explained, informed consent was obtained. The patient was then placed in the left, lateral, decubitus postion and IV sedation was administered. Throughout the procedure, the patients blood pressure, pulse and oxygen saturations were monitored continuously.  Under direct visualization, the PENTAX EUS SCOPE  endoscope was introduced through the mouth  and advanced to the second portion of the duodenum .  Water was used as necessary to provide an acoustic interface.  Upon completion of the imaging, water was removed and the patient was sent to the recovery room in satisfactory condition.  Endoscopic findings: 1. At 30cm from the incisors, there was smoothly indented esophageal mucosa causing slight luminal narrowing. The mucosa at this site was normal. 2. Otherwise nornal UGI tract.  EUS findings: 1. At 30cm from incisors there was a solid, hypoechoic 2.6cm mass that appeared to involve the muscularis propria layer of the esophageal wall. This was sampled with two passes of a 22 gauge EUS FNA needle. 2. At GE junction there was a similar appearing, but much smaller (62mm lesion). 3. Limited views of pancreas, liver, spleen, portal and splenic vessels were all normal.  Impression: Submucosal mass in mid esophagus,  communicating with muscularis propria layer of the esophagus wall.  This is 2.6cm, causes mild extrinsic compression of the esophageal lumen and possibly her mild dysphagia as well.  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   _______________________________ eSigned:  Milus Banister, MD 05/16/2014 12:57 PM

## 2014-05-16 NOTE — Discharge Instructions (Signed)

## 2014-05-16 NOTE — H&P (View-Only) (Signed)
[facility]  Chelsea Jordan    [id number]    Aug 18, 1927    Assessment and Plan/Recommendations:  1. Pill Dysphagia: History of dysphagia with dilatation but problems primarily with pills this time. Recommend Esophagram with tablet for further evaluation.  May need EGD if progressive, but pt seems to have had some relief by cutting pills and adding to food.  2. GERD: Managed well on Nexium once a day.  No changes needed   HPI: Chelsea Jordan is a 80 y.o. with a history of dysphagia with dilation in 2009. Pt states for the last 4 months she has had dysphagia to pills.  She describes this as a sticking sensation behind her cricoid that can last up to 30-40 minutes after taking a medicine that is sometimes relieved with liquids. It is worse with her larger pills, her calcium and multivitamin, but sometimes occurs with her smaller pills as well.  Pt denies dysphagia to solid foods, but states liquids do sometimes cause her to "choke up".  She denies lying down after taking her pills.  She states she has had 10 lbs of weight loss over the past year.  She will also occasionally wake up with ABD pain from an incisional hernia from her cholecystectomy scar, but she has a chronic history of this and it doesn't bother her often.  She denies N/V, diarrhea, constipation, fever, chills, weakness, fatigue or night sweats.   Medications, allergies, past medical history, past surgical history, family history and social history are reviewed and updated in the EMR.  Review of systems: Positive for: Dysphagia, ABD pain All other ROS negative or as per HPI  Physical Exam: BP 148/80  Pulse 64  Ht 5' 3.5" (1.613 m)  Wt 123 lb 3.2 oz (55.883 kg)  BMI 21.48 kg/m2 Constitutional: WDWN NAD Eyes: anicteric Mouth: oral and posterior pharynx free of lesions Neck: supple, no mass or thyromegaly. Without lymphadenopathy Lungs: clear to auscultation bilaterally, unlabored and even. Cardiovascular: S1S2 with  regular rate and rhythm, no rubs murmurs or gallops noted. Abdomen: soft, nontender, nondistended, no masses or organomegaly, normal bowel sounds. Small knot at superior portion of cholecystectomy incision, but no hernia appreciated on coughing or neck flexion while supine. Extremities: no lower extremity edema, 2+ radial pulse Skin: no rash Neuro: alert and oriented x 3 Psych: normal mood and affect  Data Reviewed: Previous pt records, labs, and EGD results reviewed.  Legrand Como A. [place], [place] 04/24/2014 8:47 AM    I have personally seen the patient, reviewed and repeated key elements of the history and physical and participated in formation of the assessment and plan the student has documented.  Gatha Mayer, MD, [facility]

## 2014-05-17 ENCOUNTER — Encounter (HOSPITAL_COMMUNITY): Payer: Self-pay | Admitting: Gastroenterology

## 2014-05-20 ENCOUNTER — Telehealth: Payer: Self-pay | Admitting: Gastroenterology

## 2014-05-21 ENCOUNTER — Other Ambulatory Visit: Payer: Self-pay

## 2014-05-21 DIAGNOSIS — C49A0 Gastrointestinal stromal tumor, unspecified site: Secondary | ICD-10-CM

## 2014-05-21 NOTE — Telephone Encounter (Signed)
The Chelsea Jordan was inquiring about path results.  She was advised that as soon as we have results and Dr Ardis Hughs reviews I will call her and go over recommendations.  Chelsea Jordan agreed

## 2014-06-13 ENCOUNTER — Institutional Professional Consult (permissible substitution) (INDEPENDENT_AMBULATORY_CARE_PROVIDER_SITE_OTHER): Payer: Medicare Other | Admitting: Cardiothoracic Surgery

## 2014-06-13 ENCOUNTER — Encounter: Payer: Self-pay | Admitting: Cardiothoracic Surgery

## 2014-06-13 VITALS — BP 167/92 | HR 77 | Resp 20 | Ht 64.0 in | Wt 120.0 lb

## 2014-06-13 DIAGNOSIS — R222 Localized swelling, mass and lump, trunk: Secondary | ICD-10-CM

## 2014-06-13 DIAGNOSIS — I712 Thoracic aortic aneurysm, without rupture: Secondary | ICD-10-CM | POA: Diagnosis not present

## 2014-06-13 DIAGNOSIS — C49A0 Gastrointestinal stromal tumor, unspecified site: Secondary | ICD-10-CM

## 2014-06-13 DIAGNOSIS — D481 Neoplasm of uncertain behavior of connective and other soft tissue: Secondary | ICD-10-CM

## 2014-06-13 DIAGNOSIS — I7121 Aneurysm of the ascending aorta, without rupture: Secondary | ICD-10-CM

## 2014-06-13 DIAGNOSIS — J9859 Other diseases of mediastinum, not elsewhere classified: Secondary | ICD-10-CM

## 2014-06-13 NOTE — Progress Notes (Signed)
[address]       [place] [address]             [phone]                    Cybill H [facility] Medical Record #[id number] Date of Birth: 1926/11/05  Referring: Gatha Mayer, MD Primary Care: Gwendolyn Grant, MD  Chief Complaint:    Chief Complaint  Patient presents with  . GI Problem    Surgical eval on gastrointestinal stromal tumor, Chest CT 05/03/14    History of Present Illness:    Chelsea Jordan 80 y.o. female is seen in the office  today for a history of dysphagia with dilation in 2009. For the last 4 months she has had dysphagia to pills. She describes this as a sticking sensation behind her cricoid that can last up to 30-40 minutes after taking a medicine that is sometimes relieved with liquids. It is worse with her larger pills, her calcium and multivitamin,  Pt denies dysphagia to solid foods. Since mixing Her pills with food she is able to swallow them without difficulty   She states she has had 10 lbs of weight loss over the past year. She will also occasionally wake up with ABD pain from an incisional hernia from her cholecystectomy scar. 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 On retrospective review of previous CT scans this is been present and stable in size for at least 2 years.  Current Activity/ Functional Status:  Patient is independent with mobility/ambulation, transfers, ADL's, IADL's.   Zubrod Score: At the time of surgery this patient's most appropriate activity status/level should be described as: []     0    Normal activity, no symptoms [x]     1    Restricted in physical strenuous activity but ambulatory, able to do out light work []     2    Ambulatory and capable of self care, unable to do work activities, up and about               >50 % of waking hours                              []     3    Only limited self care, in bed greater  than 50% of waking hours []     4    Completely disabled, no self care, confined to bed or chair []     5    Moribund   Past Medical History  Diagnosis Date  . DYSLIPIDEMIA   . HYPERTENSION   . MITRAL VALVE PROLAPSE   . OSTEOARTHRITIS   . OSTEOPOROSIS   . COLONIC POLYPS, HX OF   . Chronic cystitis   . GERD (gastroesophageal reflux disease)   . Macular degeneration     optho q3mo - [facility]  . Meningioma   . NEPHROLITHIASIS   . Anxiety   . Bronchitis   . Diverticulosis   . Hemorrhoids   . Benign fundic gland polyps of stomach   . Hiatal hernia   . Gastritis   . Complication of anesthesia   . PONV (postoperative nausea and vomiting)   . Cancer     recent skin cancer left leg  excised about 8 days ago-remains with dressing intact.  Past Surgical History  Procedure Laterality Date  . Cholecystectomy    . Abdominal hysterectomy    . Appendectomy    . Knee surgery    . Cataract surgery  2000  . Surgery of lip  in 2002  . Breast surgery    . Tonsillectomy    . Ventral hernia repair  07/04/2012    Procedure: HERNIA REPAIR VENTRAL ADULT;  Surgeon: Odis Hollingshead, MD;  Location: [employer] [facility];  Service: General;  Laterality: N/A;  . Insertion of mesh  07/04/2012    Procedure: INSERTION OF MESH;  Surgeon: Odis Hollingshead, MD;  Location: [facility];  Service: General;  Laterality: N/A;  . Leg skin lesion  biopsy / excision Left     8 days ago pending pathology, remains with dressing.  . Eus N/A 05/16/2014    Procedure: UPPER ENDOSCOPIC ULTRASOUND (EUS) LINEAR;  Surgeon: Milus Banister, MD;  Location: [facility];  Service: Endoscopy;  Laterality: N/A;    Family History  Problem Relation Age of Onset  . Heart disease Mother   . Heart disease Father   . Breast cancer Sister   . Heart disease Other   . Emphysema Father   . Lung cancer Sister     History   Social History  . Marital Status: Married    Spouse Name: N/A    Number of Children: 2  . Years of Education: N/A    Occupational History  . retired    Social History Main Topics  . Smoking status: Never Smoker   . Smokeless tobacco: Never Used     Comment: Married, lives with spouse, Enjoys golf. Retired  . Alcohol Use: Yes     Comment: Wine -nightly  . Drug Use: No  . Sexual Activity: Not Currently   Other Topics Concern  . Not on file   Social History Narrative   Married, lives with spouse - enjoys golf    History  Smoking status  . Never Smoker   Smokeless tobacco  . Never Used    Comment: Married, lives with spouse, Enjoys golf. Retired    History  Alcohol Use  . Yes    Comment: Wine -nightly     Allergies  Allergen Reactions  . Pentothal [Thiopental] Nausea And Vomiting  . Codeine   . Levofloxacin   . Oxycodone-Acetaminophen   . Sulfonamide Derivatives     REACTION: GI upset/nausea/vomiting  . Tape Rash    Per patient adhesive tape    Current Outpatient Prescriptions  Medication Sig Dispense Refill  . amoxicillin (AMOXIL) 500 MG capsule 4 capsules prior to dental work as needed      . Ascorbic Acid (VITAMIN C) 500 MG tablet Take 500 mg by mouth daily.        Marland Kitchen aspirin 81 MG tablet Take 81 mg by mouth at bedtime.       . calcium-vitamin D (CALCIUM 500+D) 500-200 MG-UNIT per tablet Take 1 tablet by mouth daily.        . chlorpheniramine (CHLOR-TRIMETON) 4 MG tablet Take 4 mg by mouth every 4 (four) hours as needed. Sinus/allegries      . Cholecalciferol (VITAMIN D3) 2000 UNITS TABS Take 2,000 Units by mouth daily.        Marland Kitchen denosumab (PROLIA) 60 MG/ML SOLN injection Inject 60 mg into the skin every 6 (six) months. Administer in upper arm, thigh, or abdomen  1 mL  3  . esomeprazole (NEXIUM) 40 MG capsule Take 30-  60 min before your first and last meals of the day  180 capsule  4  . losartan (COZAAR) 50 MG tablet Take 50 mg by mouth every morning.      . Lutein-Zeaxanthin 20-1 MG CAPS Take 1 capsule by mouth daily.      . Multiple Vitamins-Minerals (CENTRUM SILVER)  tablet Take 1 tablet by mouth daily.        . nitrofurantoin (MACRODANTIN) 100 MG capsule Take 100 mg by mouth at bedtime.      . simvastatin (ZOCOR) 5 MG tablet Take 1 tablet (5 mg total) by mouth at bedtime.  90 tablet  3  . vitamin E 400 UNIT capsule Take 400 Units by mouth every morning.       No current facility-administered medications for this visit.     Review of Systems:     Cardiac Review of Systems: Y or N  Chest Pain [    ]  Resting SOB [   ] Exertional SOB  [  ]  Orthopnea [  ]   Pedal Edema [   ]    Palpitations [  ] Syncope  [  ]   Presyncope [   ]  General Review of Systems: [Y] = yes [  ]=no Constitional: recent weight change [  ];  Wt loss over the last 3 months [   ] anorexia [  ]; fatigue [  ]; nausea [  ]; night sweats [  ]; fever [  ]; or chills [  ];          Dental: poor dentition[ dentures ]; Last Dentist visit:   Eye : blurred vision Blue.Reese  ]; diplopia [   ]; vision changes [  ];  Amaurosis fugax[  ]; Resp: cough [  ];  wheezing[  ];  hemoptysis[  ]; shortness of breath[  ]; paroxysmal nocturnal dyspnea[  ]; dyspnea on exertion[  ]; or orthopnea[  ];  GI:  gallstones[  ], vomiting[  ];  dysphagia[  ]; melena[ n ];  hematochezia [n  ]; heartburn[  ];   Hx of  Colonoscopy[y  ]; GU: kidney stones [  ]; hematuria[  ];   dysuria [  ];  nocturia[  ];  history of     obstruction [  ]; urinary frequency [  ]             Skin: rash, swelling[  ];, hair loss[  ];  peripheral edema[  ];  or itching[  ]; Musculosketetal: myalgias[y  ];  joint swelling[  ];  joint erythema[y ];  joint pain[ y ];  back pain[  ];  Heme/Lymph: bruising[ y ];  bleeding[  ];  anemia[  ];  Neuro: TIA[  ];  headaches[  ];  stroke[  ];  vertigo[  ];  seizures[  ];   paresthesias[  ];  difficulty walking[  ];  Psych:depression[  ]; anxiety[  ];  Endocrine: diabetes[ n ];  thyroid dysfunction[n  ];  Immunizations: Flu up to date Blue.Reese  ]; Pneumococcal up to date [  y];  Other:  Physical Exam: Ht 5\' 4"   (1.626 m)  Wt 120 lb (54.432 kg)  BMI 20.59 kg/m2  SpO2 %  PHYSICAL EXAMINATION:  General appearance: alert, cooperative, appears stated age and no distress Neurologic: intact Heart: regular rate and rhythm, S1, S2 normal, no murmur, click, rub or gallop Lungs: clear to auscultation bilaterally Abdomen: soft, non-tender; bowel sounds normal;  no masses,  no organomegaly Extremities: extremities normal, atraumatic, no cyanosis or edema and Homans sign is negative, no sign of DVT Wound: Patient right subcostal incision is well-healed she notes she's had a recent hernia repair in this area Patient has no cervical or supraclavicular adenopathy  Diagnostic Studies & Laboratory data:     Recent Radiology Findings:    CLINICAL DATA: Mediastinal mass. Extrinsic compression of esophagus  seen on recent esophagram.  EXAM:  CT CHEST WITH CONTRAST  TECHNIQUE:  Multidetector CT imaging of the chest was performed during  intravenous contrast administration.  CONTRAST: 29mL OMNIPAQUE IOHEXOL 300 MG/ML SOLN  COMPARISON: Esophagram on 04/29/2014 and abdomen CT on 11/28/2012  FINDINGS:  Mediastinum/Hilar Regions: A low-attenuation mass is seen in the  middle mediastinum along the right lateral wall of the distal  thoracic esophagus. This measures 2.6 x 2.6 cm in shows no  significant change in size since prior abdomen CT in 2014. This has  homogeneous attenuation measuring 19 Hounsfield units. A second  similar low-attenuation nodule measuring 12 mm is seen adjacent to  the GE junction. This is also stable. These characteristics favor a  bronchogenic cysts over solid neoplasm or lymphadenopathy.  Other Thoracic Lymphadenopathy: None.  Lungs: No pulmonary infiltrate or mass identified. Scarring noted in  the lateral right lung base.  Pleura: No evidence of effusion or mass.  Vascular/Cardiac: 4.1 cm ascending thoracic aortic aneurysm is seen.  No evidence of thoracic aortic dissection.   Musculoskeletal: No suspicious bone lesions identified.  Other: None.  IMPRESSION:  2.6 cm low-attenuation middle mediastinal mass adjacent to the  distal thoracic esophagus, and similar 12 mm low-attenuation nodule  adjacent to the GE junction. These have been stable since 2014 CT,  and characteristics favor bronchogenic cysts over solid neoplasm or  lymphadenopathy. Recommend further characterization as cystic versus  solid with chest MRI without and with contrast.  4.1 cm ascending thoracic aortic aneurysm.  Electronically Signed  By: Earle Gell M.D.  On: 05/03/2014 14:29    Recent Lab Findings: Lab Results  Component Value Date   WBC 6.8 06/22/2013   HGB 14.8 06/22/2013   HCT 43.3 06/22/2013   PLT 191.0 06/22/2013   GLUCOSE 111* 06/22/2013   CHOL 210* 06/22/2013   TRIG 82.0 06/22/2013   HDL 92.80 06/22/2013   LDLDIRECT 97.7 06/22/2013   LDLCALC 86 12/07/2011   ALT 21 06/22/2013   AST 23 06/22/2013   NA 138 06/22/2013   K 4.1 06/22/2013   CL 101 06/22/2013   CREATININE 0.5 05/01/2014   BUN 14 05/01/2014   CO2 29 06/22/2013   TSH 3.18 06/22/2013   INR 0.94 06/28/2012   Aortic Size Index=     4.1    /Body surface area is 1.57 meters squared. 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 It's likely that surgical intervention would be more lethal been her current situation. Consider followup CT scan in 8-12 months I would be glad to see the patient back to GI as requested  in the future should it be necessary   Patient also has 4.1 cm ascending thoracic aortic aneurysm. Would not recommend any intervention for this.  I discussed with the  patient both the esophageal mass and the mildly dilated ascending aorta and have answered her questions.     I spent 40 minutes counseling the patient face to face. The total time spent in the appointment was 60 minutes.  Grace Isaac MD      [address] [place] [address] Office [phone]   Beeper [phone]  06/13/2014 11:10 AM

## 2014-06-19 DIAGNOSIS — Z85828 Personal history of other malignant neoplasm of skin: Secondary | ICD-10-CM | POA: Diagnosis not present

## 2014-06-19 DIAGNOSIS — D485 Neoplasm of uncertain behavior of skin: Secondary | ICD-10-CM | POA: Diagnosis not present

## 2014-06-19 DIAGNOSIS — Z08 Encounter for follow-up examination after completed treatment for malignant neoplasm: Secondary | ICD-10-CM | POA: Diagnosis not present

## 2014-06-25 ENCOUNTER — Encounter: Payer: Medicare Other | Admitting: Internal Medicine

## 2014-07-01 ENCOUNTER — Telehealth: Payer: Self-pay | Admitting: Cardiology

## 2014-07-01 NOTE — Telephone Encounter (Signed)
Pt. Wanted you to know her GI physcian found an aneurysm

## 2014-07-01 NOTE — Telephone Encounter (Signed)
New problem    Pt stated her GI physician found an aneurysm. Pt wanted Dr Percival Spanish to know. Please call pt.

## 2014-07-02 ENCOUNTER — Telehealth: Payer: Self-pay | Admitting: Internal Medicine

## 2014-07-02 DIAGNOSIS — C49A0 Gastrointestinal stromal tumor, unspecified site: Secondary | ICD-10-CM

## 2014-07-02 NOTE — Telephone Encounter (Signed)
Dr. Pia Mau recommended no surgery at this time for GIST tumor.  She would like to know the next step.  Please advise

## 2014-07-03 NOTE — Telephone Encounter (Signed)
CT noted.  4.1 cm ascending aorta.  We will follow.

## 2014-07-03 NOTE — Telephone Encounter (Signed)
Patient notified that Dr. Carlean Purl will call her he gets a chance.

## 2014-07-03 NOTE — Telephone Encounter (Signed)
I am not sure I will call her when I can - please tell her  There is chemotherapy for her tumor and we could consider an oncology referral  At her age risks may not be worth the benefits

## 2014-07-08 ENCOUNTER — Encounter: Payer: Self-pay | Admitting: Cardiology

## 2014-07-08 ENCOUNTER — Ambulatory Visit (INDEPENDENT_AMBULATORY_CARE_PROVIDER_SITE_OTHER): Payer: Medicare Other | Admitting: Cardiology

## 2014-07-08 VITALS — BP 110/82 | HR 70 | Ht 63.4 in | Wt 122.2 lb

## 2014-07-08 DIAGNOSIS — I712 Thoracic aortic aneurysm, without rupture, unspecified: Secondary | ICD-10-CM | POA: Insufficient documentation

## 2014-07-08 DIAGNOSIS — I10 Essential (primary) hypertension: Secondary | ICD-10-CM | POA: Diagnosis not present

## 2014-07-08 DIAGNOSIS — I059 Rheumatic mitral valve disease, unspecified: Secondary | ICD-10-CM

## 2014-07-08 NOTE — Patient Instructions (Signed)
Continue same medications     Your physician wants you to follow-up in: June 02/2015. You will receive a reminder letter in the mail two months in advance. If you don't receive a letter, please call our office to schedule the follow-up appointment.

## 2014-07-08 NOTE — Progress Notes (Signed)
HPI The patient returns for one year followup. Since I last saw her she has done well.  She has been diagnosed with GIST.  She also has a small 4.1 cm thoracic aneurysm.  There has been no new shortness of breath, PND or orthopnea. There have been no reported palpitations, presyncope or syncope.  She is active and still golfs.  She walks the stairs for exercise.  She rarely some chest discomfort. This is a rare and very mild discomfort that she notices periodically. It is 1/10. There are no associated symptoms. There is no nausea or vomiting or diaphoresis. She cannot bring this on with her activities. It seems to come and go by itself.   Allergies  Allergen Reactions  . Pentothal [Thiopental] Nausea And Vomiting  . Codeine   . Levofloxacin   . Oxycodone-Acetaminophen   . Sulfonamide Derivatives     REACTION: GI upset/nausea/vomiting  . Tape Rash    Per patient adhesive tape    Current Outpatient Prescriptions  Medication Sig Dispense Refill  . amoxicillin (AMOXIL) 500 MG capsule 4 capsules prior to dental work as needed    . Ascorbic Acid (VITAMIN C) 500 MG tablet Take 500 mg by mouth daily.      Marland Kitchen aspirin 81 MG tablet Take 81 mg by mouth at bedtime.     . calcium-vitamin D (CALCIUM 500+D) 500-200 MG-UNIT per tablet Take 1 tablet by mouth daily.      . chlorpheniramine (CHLOR-TRIMETON) 4 MG tablet Take 4 mg by mouth every 4 (four) hours as needed. Sinus/allegries    . Cholecalciferol (VITAMIN D3) 2000 UNITS TABS Take 2,000 Units by mouth daily.      Marland Kitchen denosumab (PROLIA) 60 MG/ML SOLN injection Inject 60 mg into the skin every 6 (six) months. Administer in upper arm, thigh, or abdomen 1 mL 3  . esomeprazole (NEXIUM) 40 MG capsule Take 30- 60 min before your first and last meals of the day 180 capsule 4  . losartan (COZAAR) 50 MG tablet Take 50 mg by mouth every morning.    . Lutein-Zeaxanthin 20-1 MG CAPS Take 1 capsule by mouth daily.    . Multiple Vitamins-Minerals (CENTRUM SILVER)  tablet Take 1 tablet by mouth daily.      . nitrofurantoin (MACRODANTIN) 100 MG capsule Take 100 mg by mouth at bedtime.    . simvastatin (ZOCOR) 5 MG tablet Take 1 tablet (5 mg total) by mouth at bedtime. 90 tablet 3  . vitamin E 400 UNIT capsule Take 400 Units by mouth every morning.     No current facility-administered medications for this visit.    Past Medical History  Diagnosis Date  . DYSLIPIDEMIA   . HYPERTENSION   . MITRAL VALVE PROLAPSE   . OSTEOARTHRITIS   . OSTEOPOROSIS   . COLONIC POLYPS, HX OF   . Chronic cystitis   . GERD (gastroesophageal reflux disease)   . Macular degeneration     optho q75mo - [facility]  . Meningioma   . NEPHROLITHIASIS   . Anxiety   . Bronchitis   . Diverticulosis   . Hemorrhoids   . Benign fundic gland polyps of stomach   . Hiatal hernia   . Gastritis   . Complication of anesthesia   . PONV (postoperative nausea and vomiting)   . Cancer     recent skin cancer left leg  excised about 8 days ago-remains with dressing intact.     Past Surgical History  Procedure Laterality Date  .  Cholecystectomy    . Abdominal hysterectomy    . Appendectomy    . Knee surgery    . Cataract surgery  2000  . Surgery of lip  in 2002  . Breast surgery    . Tonsillectomy    . Ventral hernia repair  07/04/2012    Procedure: HERNIA REPAIR VENTRAL ADULT;  Surgeon: Odis Hollingshead, MD;  Location: [facility];  Service: General;  Laterality: N/A;  . Insertion of mesh  07/04/2012    Procedure: INSERTION OF MESH;  Surgeon: Odis Hollingshead, MD;  Location: [facility];  Service: General;  Laterality: N/A;  . Leg skin lesion  biopsy / excision Left     8 days ago pending pathology, remains with dressing.  . Eus N/A 05/16/2014    Procedure: UPPER ENDOSCOPIC ULTRASOUND (EUS) LINEAR;  Surgeon: Milus Banister, MD;  Location: [facility];  Service: Endoscopy;  Laterality: N/A;    ROS:  As stated in the HPI and negative for all other systems.  PHYSICAL EXAM BP 110/82 mmHg   Pulse 70  Ht 5' 3.4" (1.61 m)  Wt 122 lb 3.2 oz (55.43 kg)  BMI 21.38 kg/m2 GENERAL:  Well appearing NECK:  No jugular venous distention, waveform within normal limits, carotid upstroke brisk and symmetric, no bruits, no thyromegaly LYMPHATICS:  No cervical, inguinal adenopathy LUNGS:  Clear to auscultation bilaterally BACK:  No CVA tenderness HEART:  PMI not displaced or sustained,S1 and S2 within normal limits, no S3, no S4, no clicks, no rubs, soft systolic murmur at the apex. ABD:  Flat, positive bowel sounds normal in frequency in pitch, no bruits, no rebound, no guarding, no midline pulsatile mass, no hepatomegaly, no splenomegaly EXT:  2 plus pulses throughout, no edema, no cyanosis no clubbing  EKG:  Sinus rhythm, rate 70, axis within normal limits, intervals within normal limits, no acute ST-T wave changes.  07/08/2014  ASSESSMENT AND PLAN  CHEST PAIN:  This is very atypical and has not changed since the last appointment. No change in therapy is indicated.  No further stress testing is planed  AS:  This has been very mild on echo in the past. I would not suspect any change based on physical exam and lack of symptoms. No further imaging is warranted.  MVP:  I don't hear any significant regurgitant murmur clicks. No further imaging is planned.  HTN:   The blood pressure is at target. No change in medications is indicated. We will continue with therapeutic lifestyle changes (TLC).   THORACIC ANEURYSM:  This is small. We will follow this clinically.

## 2014-07-10 NOTE — Telephone Encounter (Signed)
Referral placed.

## 2014-07-10 NOTE — Telephone Encounter (Signed)
I discussed her situation and she would like to know "all of my options". I will ask for an appointment with Dr. Benay Spice - not urgent - to see what he thinks but I told her observation likely best option over chemoTx at this time.  Please refer to Dr. Benay Spice re: Treatment options/appropriateness for esophageal GIST

## 2014-07-12 ENCOUNTER — Other Ambulatory Visit (INDEPENDENT_AMBULATORY_CARE_PROVIDER_SITE_OTHER): Payer: Medicare Other

## 2014-07-12 ENCOUNTER — Encounter: Payer: Self-pay | Admitting: Family

## 2014-07-12 ENCOUNTER — Ambulatory Visit (INDEPENDENT_AMBULATORY_CARE_PROVIDER_SITE_OTHER): Payer: Medicare Other | Admitting: Family

## 2014-07-12 VITALS — BP 160/98 | HR 77 | Temp 97.5°F | Resp 16 | Ht 63.5 in | Wt 122.0 lb

## 2014-07-12 DIAGNOSIS — K219 Gastro-esophageal reflux disease without esophagitis: Secondary | ICD-10-CM | POA: Diagnosis not present

## 2014-07-12 DIAGNOSIS — E785 Hyperlipidemia, unspecified: Secondary | ICD-10-CM

## 2014-07-12 DIAGNOSIS — R319 Hematuria, unspecified: Secondary | ICD-10-CM | POA: Diagnosis not present

## 2014-07-12 DIAGNOSIS — N390 Urinary tract infection, site not specified: Secondary | ICD-10-CM | POA: Diagnosis not present

## 2014-07-12 DIAGNOSIS — I10 Essential (primary) hypertension: Secondary | ICD-10-CM | POA: Diagnosis not present

## 2014-07-12 DIAGNOSIS — Z23 Encounter for immunization: Secondary | ICD-10-CM | POA: Diagnosis not present

## 2014-07-12 DIAGNOSIS — Z0000 Encounter for general adult medical examination without abnormal findings: Secondary | ICD-10-CM | POA: Diagnosis not present

## 2014-07-12 LAB — POCT URINALYSIS DIPSTICK
Bilirubin, UA: NEGATIVE
Glucose, UA: NEGATIVE
Ketones, UA: 0.5
Nitrite, UA: POSITIVE
SPEC GRAV UA: 1.025
UROBILINOGEN UA: 12 — AB
pH, UA: 6.5

## 2014-07-12 LAB — LIPID PANEL
Cholesterol: 212 mg/dL — ABNORMAL HIGH (ref 0–200)
HDL: 96.2 mg/dL (ref >39.00)
LDL Cholesterol: 102 mg/dL — ABNORMAL HIGH (ref 0–99)
NonHDL: 115.8
TRIGLYCERIDES: 69 mg/dL (ref 0.0–149.0)
Total CHOL/HDL Ratio: 2
VLDL: 13.8 mg/dL (ref 0.0–40.0)

## 2014-07-12 LAB — BASIC METABOLIC PANEL
BUN: 17 mg/dL (ref 6–23)
CALCIUM: 9.7 mg/dL (ref 8.4–10.5)
CO2: 27 mEq/L (ref 19–32)
Chloride: 103 mEq/L (ref 96–112)
Creatinine, Ser: 0.6 mg/dL (ref 0.4–1.2)
GFR: 98.98 mL/min (ref >60.00)
Glucose, Bld: 96 mg/dL (ref 70–99)
Potassium: 4.4 mEq/L (ref 3.5–5.1)
SODIUM: 138 meq/L (ref 135–145)

## 2014-07-12 LAB — CBC
HEMATOCRIT: 43 % (ref 36.0–46.0)
Hemoglobin: 14.2 g/dL (ref 12.0–15.0)
MCHC: 33 g/dL (ref 30.0–36.0)
MCV: 92.5 fl (ref 78.0–100.0)
Platelets: 209 10*3/uL (ref 150.0–400.0)
RBC: 4.65 Mil/uL (ref 3.87–5.11)
RDW: 13.6 % (ref 11.5–15.5)
WBC: 10 10*3/uL (ref 4.0–10.5)

## 2014-07-12 LAB — TSH: TSH: 2.02 u[IU]/mL (ref 0.35–4.50)

## 2014-07-12 MED ORDER — FOSFOMYCIN TROMETHAMINE 3 G PO PACK: 3.0000 g | PACK | Freq: Once | ORAL | Status: DC

## 2014-07-12 NOTE — Assessment & Plan Note (Addendum)
Blood pressure remains labile. Indicates she has "white coat syndrome". Currently maintained on losartan. Continue current losartan. Consider adding additional medication if blood pressure continues to remain high.

## 2014-07-12 NOTE — Progress Notes (Signed)
Pre visit review using our clinic review tool, if applicable. No additional management support is needed unless otherwise documented below in the visit note. 

## 2014-07-12 NOTE — Progress Notes (Signed)
Subjective:   Patient ID: Chelsea Jordan, female    DOB: [birth year], 80 y.o.   MRN: [id number]   Chelsea Jordan is a 79 y.o. female who presents for Medicare Annual/Subsequent preventive examination.   Preventive Screening-Counseling & Management  Tobacco History  Smoking status  . Never Smoker   Smokeless tobacco  . Never Used    Comment: Married, lives with spouse, Enjoys golf. Retired   Paraesophogeal mass, thoracic aneusym  Problems Prior to Visit 1. Hypertension - Currently maintained on losartan. Was recently seen by cardiology. Denies any adverse effects - denies shortness of breath, palpitations, edema.  BP Readings from Last 3 Encounters:  07/12/14 160/98  07/08/14 110/82  06/13/14 167/92   2. Hyperlipidemia - currently maintained on Zocor. Denies any muscle pains or adverse effects.   Lab Results  Component Value Date   CHOL 210* 06/22/2013   HDL 92.80 06/22/2013   LDLCALC 86 12/07/2011   LDLDIRECT 97.7 06/22/2013   TRIG 82.0 06/22/2013   CHOLHDL 2 06/22/2013   3) GERD - maintained on esomeprazole. Denies any flairs or side effects.  Current Problems (verified) Patient Active Problem List   Diagnosis Date Noted  . Thoracic aortic aneurysm 07/08/2014  . Recurrent UTI 03/12/2014  . Upper airway cough syndrome 01/03/2014  . Thrush 08/16/2013  . Eustachian tube dysfunction 08/16/2013  . Macular degeneration   . Abdominal tenderness, RUQ (right upper quadrant) 04/24/2012  . GERD (gastroesophageal reflux disease)   . Encounter for long-term (current) use of other medications 12/07/2010  . Mitral valve disorder 11/17/2009  . DYSLIPIDEMIA 09/24/2009  . Essential hypertension 09/24/2009  . GENERALIZED OSTEOARTHROSIS UNSPECIFIED SITE 09/24/2009  . OSTEOPOROSIS 07/15/2009  . MENINGIOMA 10/20/2007  . 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 REACTIVE AIRWAY DISEASE 10/20/2007  . NEPHROLITHIASIS 10/20/2007  . OSTEOARTHRITIS 10/20/2007     Medications Prior to  Visit Current Outpatient Prescriptions on File Prior to Visit  Medication Sig Dispense Refill  . amoxicillin (AMOXIL) 500 MG capsule 4 capsules prior to dental work as needed    . Ascorbic Acid (VITAMIN C) 500 MG tablet Take 500 mg by mouth daily.      Marland Kitchen aspirin 81 MG tablet Take 81 mg by mouth at bedtime.     . calcium-vitamin D (CALCIUM 500+D) 500-200 MG-UNIT per tablet Take 1 tablet by mouth daily.      . chlorpheniramine (CHLOR-TRIMETON) 4 MG tablet Take 4 mg by mouth every 4 (four) hours as needed. Sinus/allegries    . Cholecalciferol (VITAMIN D3) 2000 UNITS TABS Take 2,000 Units by mouth daily.      Marland Kitchen denosumab (PROLIA) 60 MG/ML SOLN injection Inject 60 mg into the skin every 6 (six) months. Administer in upper arm, thigh, or abdomen 1 mL 3  . esomeprazole (NEXIUM) 40 MG capsule Take 30- 60 min before your first and last meals of the day 180 capsule 4  . losartan (COZAAR) 50 MG tablet Take 50 mg by mouth every morning.    . Lutein-Zeaxanthin 20-1 MG CAPS Take 1 capsule by mouth daily.    . Multiple Vitamins-Minerals (CENTRUM SILVER) tablet Take 1 tablet by mouth daily.      . nitrofurantoin (MACRODANTIN) 100 MG capsule Take 100 mg by mouth at bedtime.    . simvastatin (ZOCOR) 5 MG tablet Take 1 tablet (5 mg total) by mouth at bedtime. 90 tablet 3  . vitamin E 400 UNIT capsule Take 400 Units by mouth every morning.     No current  facility-administered medications on file prior to visit.    Current Medications (verified) Current Outpatient Prescriptions  Medication Sig Dispense Refill  . amoxicillin (AMOXIL) 500 MG capsule 4 capsules prior to dental work as needed    . Ascorbic Acid (VITAMIN C) 500 MG tablet Take 500 mg by mouth daily.      Marland Kitchen aspirin 81 MG tablet Take 81 mg by mouth at bedtime.     . calcium-vitamin D (CALCIUM 500+D) 500-200 MG-UNIT per tablet Take 1 tablet by mouth daily.      . chlorpheniramine (CHLOR-TRIMETON) 4 MG tablet Take 4 mg by mouth every 4 (four) hours as  needed. Sinus/allegries    . Cholecalciferol (VITAMIN D3) 2000 UNITS TABS Take 2,000 Units by mouth daily.      Marland Kitchen denosumab (PROLIA) 60 MG/ML SOLN injection Inject 60 mg into the skin every 6 (six) months. Administer in upper arm, thigh, or abdomen 1 mL 3  . esomeprazole (NEXIUM) 40 MG capsule Take 30- 60 min before your first and last meals of the day 180 capsule 4  . losartan (COZAAR) 50 MG tablet Take 50 mg by mouth every morning.    . Lutein-Zeaxanthin 20-1 MG CAPS Take 1 capsule by mouth daily.    . Multiple Vitamins-Minerals (CENTRUM SILVER) tablet Take 1 tablet by mouth daily.      . nitrofurantoin (MACRODANTIN) 100 MG capsule Take 100 mg by mouth at bedtime.    . simvastatin (ZOCOR) 5 MG tablet Take 1 tablet (5 mg total) by mouth at bedtime. 90 tablet 3  . vitamin E 400 UNIT capsule Take 400 Units by mouth every morning.     No current facility-administered medications for this visit.     Allergies (verified) Pentothal; Codeine; Levofloxacin; Oxycodone-acetaminophen; Sulfonamide derivatives; and Tape   PAST HISTORY  Family History Family History  Problem Relation Age of Onset  . Heart disease Mother   . Heart disease Father   . Breast cancer Sister   . Heart disease Other   . Emphysema Father   . Lung cancer Sister     Social History History  Substance Use Topics  . Smoking status: Never Smoker   . Smokeless tobacco: Never Used     Comment: Married, lives with spouse, Enjoys golf. Retired  . Alcohol Use: Yes     Comment: Wine -nightly    Are there smokers in your home (other than you)?  No  Risk Factors Current exercise habits: Golf and walking around department stores shopping. Dietary issues discussed:   Cardiac risk factors: advanced age (older than 21 for men, 84 for women), dyslipidemia and hypertension.  Depression Screen  Q1: Over the past two weeks, have you felt down, depressed or hopeless? No  Q2: Over the past two weeks, have you felt little  interest or pleasure in doing things? No  Have you lost interest or pleasure in daily life? No  Do you often feel hopeless? No}  Do you cry easily over simple problems? No  Activities of Daily Living In your present state of health, do you have any difficulty performing the following activities?:  Driving? No Managing money?  No Feeding yourself? No Getting from bed to chair? No Climbing a flight of stairs? No Preparing food and eating?: No Bathing or showering? No Getting dressed: No Getting to the toilet? No Using the toilet: No Moving around from place to place: No In the past year have you fallen or had a near fall?:No  Are you  sexually active?  Yes  Do you have more than one partner?  No  Home Safety: Has smoke detector and wears seat belts. Firearms but are secured. No excess sun exposure.  Hearing Difficulties: Yes - hearing aids Do you often ask people to speak up or repeat themselves? No Do you experience ringing or noises in your ears? No  Do you have difficulty understanding soft or whispered voices? No  Do you feel that you have a problem with memory? No  Do you often misplace items? No  Do you feel safe at home?  Yes  Cognitive Testing  Alert? Yes   Normal Appearance? Yes  Oriented to person? Yes  Place? Yes   Time? Yes  Recall of three objects?  Yes  Can perform simple calculations? Yes  Displays appropriate judgment? Yes  Can read the correct time from a watch face? Yes   Advanced Directives have been discussed with the patient?    List the Names of Other Physician/Practitioners you currently use: 1.  Dr. Jeneen Rinks [place] 2. Dr. Christinia Gully 3. Dr. Nelva Nay any recent Medical Services you may have received from other than [facility] providers in the past year (date may be approximate).  Immunization History  Administered Date(s) Administered  . Influenza Split 06/07/2011, 06/13/2012, 06/15/2013  . Influenza Whole 06/06/2009, 05/18/2010  .  Influenza, High Dose Seasonal PF 06/15/2013  . Pneumococcal Polysaccharide-23 09/07/2007  . Td 09/07/2007    Screening Tests Health Maintenance  Topic Date Due  . ZOSTAVAX  02/05/1989  . COLONOSCOPY  02/04/2014  . INFLUENZA VACCINE  04/06/2014  . TETANUS/TDAP  09/06/2017  . DEXA SCAN  Completed  . PNEUMOCOCCAL POLYSACCHARIDE VACCINE AGE 72 AND OVER  Completed    All answers were reviewed with the patient and necessary referrals were made:  Mauricio Po, [place]   07/12/2014    Review of Systems Constitutional: Denies fever, chills, fatigue, or significant weight gain/loss. HENT: Head: Denies headache or neck pain Ears: Denies changes in hearing, ringing in ears, earache, drainage Nose: Denies discharge, stuffiness, itching, nosebleed, sinus pain Throat: Denies sore throat, hoarseness, dry mouth, sores, thrush Eyes: Denies loss/changes in vision, pain, redness, blurry/double vision, flashing lights Cardiovascular: Denies chest pain/discomfort, tightness, palpitations, shortness of breath with activity, difficulty lying down, swelling, sudden awakening with shortness of breath Respiratory: Denies shortness of breath, cough, sputum production, wheezing Gastrointestinal: Denies dysphasia, heartburn, change in appetite, nausea, change in bowel habits, rectal bleeding, constipation, diarrhea, yellow skin or eyes Genitourinary: Denies incontinence, change in urinary strength. Dysuria, frequency and urgency. Denies back pain. Denies fever or chills.  Musculoskeletal: Denies muscle/joint pain, stiffness, back pain, redness or swelling of joints, trauma Skin: Denies rashes, lumps, itching, dryness, color changes, or hair/nail changes Neurological: Denies dizziness, fainting, seizures, weakness, numbness, tingling, tremor Psychiatric - Denies nervousness, stress, depression or memory loss Endocrine: Denies heat or cold intolerance, sweating, frequent urination, excessive thirst, changes in  appetite Hematologic: Denies ease of bruising or bleeding   Objective:    BP 160/98 mmHg  Pulse 77  Temp(Src) 97.5 F (36.4 C) (Oral)  Resp 16  Ht 5' 3.5" (1.613 m)  Wt 122 lb (55.339 kg)  BMI 21.27 kg/m2  SpO2 95%  Physical Exam  Constitutional: She is oriented to person, place, and time. She appears well-developed and well-nourished. No distress.  HENT:  Head: Normocephalic.  Right Ear: Hearing, tympanic membrane, external ear and ear canal normal.  Left Ear: Hearing, tympanic membrane, external ear and ear canal  normal.  Nose: Nose normal.  Mouth/Throat: Uvula is midline, oropharynx is clear and moist and mucous membranes are normal.  Neck: Neck supple. No JVD present. No tracheal deviation present. No thyromegaly present.  Cardiovascular: Normal rate, regular rhythm, normal heart sounds and intact distal pulses.   Respiratory: Effort normal and breath sounds normal.  GI: Soft. Bowel sounds are normal. She exhibits no distension and no mass. There is no tenderness. There is no rebound and no guarding.  Musculoskeletal: Normal range of motion.  Lymphadenopathy:    She has no cervical adenopathy.  Neurological: She is alert and oriented to person, place, and time. She has normal reflexes. No cranial nerve deficit. She exhibits normal muscle tone. Coordination normal.  Skin: Skin is warm and dry.  Psychiatric: She has a normal mood and affect. Her behavior is normal. Judgment and thought content normal.        Assessment:          Plan:     During the course of the visit the patient was educated and counseled about appropriate screening and preventive services including:    Pneumococcal vaccine   Nutrition counseling   Diet review for nutrition referral? Yes ____  Not Indicated _X___   Patient Instructions (the written plan) was given to the patient.  Medicare Attestation I have personally reviewed: The patient's medical and social history Their use of  alcohol, tobacco or illicit drugs Their current medications and supplements The patient's functional ability including ADLs,fall risks, home safety risks, cognitive, and hearing and visual impairment Diet and physical activities Evidence for depression or mood disorders  The patient's weight, height, BMI, and visual acuity have been recorded in the chart.  I have made referrals, counseling, and provided education to the patient based on review of the above and I have provided the patient with a written personalized care plan for preventive services.     Mauricio Po, FNP   07/12/2014

## 2014-07-12 NOTE — Patient Instructions (Addendum)
Thank you for choosing [facility].  Summary/Instructions:   Please take your antibiotic for your UTI  Please stop by the lab for your blood work prior to leaving  Make a follow up appointment for 1 year or sooner if needed.   Urinary Tract Infection Urinary tract infections (UTIs) can develop anywhere along your urinary tract. Your urinary tract is your body's drainage system for removing wastes and extra water. Your urinary tract includes two kidneys, two ureters, a bladder, and a urethra. Your kidneys are a pair of bean-shaped organs. Each kidney is about the size of your fist. They are located below your ribs, one on each side of your spine. CAUSES Infections are caused by microbes, which are microscopic organisms, including fungi, viruses, and bacteria. These organisms are so small that they can only be seen through a microscope. Bacteria are the microbes that most commonly cause UTIs. SYMPTOMS  Symptoms of UTIs may vary by age and gender of the patient and by the location of the infection. Symptoms in young women typically include a frequent and intense urge to urinate and a painful, burning feeling in the bladder or urethra during urination. Older women and men are more likely to be tired, shaky, and weak and have muscle aches and abdominal pain. A fever may mean the infection is in your kidneys. Other symptoms of a kidney infection include pain in your back or sides below the ribs, nausea, and vomiting. DIAGNOSIS To diagnose a UTI, your caregiver will ask you about your symptoms. Your caregiver also will ask to provide a urine sample. The urine sample will be tested for bacteria and white blood cells. White blood cells are made by your body to help fight infection. TREATMENT  Typically, UTIs can be treated with medication. Because most UTIs are caused by a bacterial infection, they usually can be treated with the use of antibiotics. The choice of antibiotic and length of treatment  depend on your symptoms and the type of bacteria causing your infection. HOME CARE INSTRUCTIONS  If you were prescribed antibiotics, take them exactly as your caregiver instructs you. Finish the medication even if you feel better after you have only taken some of the medication.  Drink enough water and fluids to keep your urine clear or pale yellow.  Avoid caffeine, tea, and carbonated beverages. They tend to irritate your bladder.  Empty your bladder often. Avoid holding urine for long periods of time.  Empty your bladder before and after sexual intercourse.  After a bowel movement, women should cleanse from front to back. Use each tissue only once. SEEK MEDICAL CARE IF:   You have back pain.  You develop a fever.  Your symptoms do not begin to resolve within 3 days. SEEK IMMEDIATE MEDICAL CARE IF:   You have severe back pain or lower abdominal pain.  You develop chills.  You have nausea or vomiting.  You have continued burning or discomfort with urination. MAKE SURE YOU:   Understand these instructions.  Will watch your condition.  Will get help right away if you are not doing well or get worse. Document Released: 06/02/2005 Document Revised: 02/22/2012 Document Reviewed: 10/01/2011 [facility] Patient Information 2015 [place], [place]. This information is not intended to replace advice given to you by your health care provider. Make sure you discuss any questions you have with your health care provider.

## 2014-07-12 NOTE — Assessment & Plan Note (Addendum)
Currently stable on Nexium. Continue current course without change.

## 2014-07-12 NOTE — Assessment & Plan Note (Signed)
1) Anticipatory Guidance: Discussed importance of wearing a seatbelt while driving and not texting while driving; changing batteries in smoke detector at least once annually; wearing suntan lotion when outside; eating a balanced and moderate diet; getting physical activity at least 30 minutes per day.  2) Immunizations / Screenings / Labs:  Prevnar completed today - all other immunizations and screenings are up to date. / Obtain CBC, BMET, Lipid profile and TSH

## 2014-07-12 NOTE — Assessment & Plan Note (Addendum)
Currently stable and maintained on simvastatin. Obtain lipid panel. Continue simvastatin.

## 2014-07-12 NOTE — Assessment & Plan Note (Addendum)
POCT UA is positive for UTI. Start fosfomycin 3g once. Urine sent for culture. If symptoms fail to improve will change ABX.

## 2014-07-13 ENCOUNTER — Telehealth: Payer: Self-pay | Admitting: Family

## 2014-07-13 NOTE — Telephone Encounter (Signed)
Please call the patient and let her know that her lab work is right where we need it to be and there are no changes that need to be made at this time.

## 2014-07-14 LAB — URINE CULTURE

## 2014-07-15 ENCOUNTER — Other Ambulatory Visit: Payer: Self-pay

## 2014-07-15 ENCOUNTER — Telehealth: Payer: Self-pay | Admitting: Oncology

## 2014-07-15 ENCOUNTER — Telehealth: Payer: Self-pay | Admitting: Internal Medicine

## 2014-07-15 MED ORDER — SIMVASTATIN 5 MG PO TABS: 5.0000 mg | ORAL_TABLET | Freq: Every day | ORAL | Status: DC

## 2014-07-15 NOTE — Telephone Encounter (Signed)
S/W PATIENT AND GAVE NP APPT FOR 11/24 @ 1:30 W/DR. SHERRILL REFERRING [facility]

## 2014-07-15 NOTE — Telephone Encounter (Signed)
Called pt to let her know that all labs were normal.

## 2014-07-15 NOTE — Telephone Encounter (Signed)
I have left a message for the referral coordinator at the cancer center asking about referral

## 2014-07-15 NOTE — Telephone Encounter (Signed)
appt is scheduled for 07/30/14 2:00 with Dr. Benay Spice

## 2014-07-15 NOTE — Telephone Encounter (Signed)
erx done

## 2014-07-15 NOTE — Telephone Encounter (Signed)
Pt called and is requesting a refill of simvastatin pt states needs authorization. [facility]/ [address] [place]

## 2014-07-23 ENCOUNTER — Telehealth: Payer: Self-pay | Admitting: *Deleted

## 2014-07-23 NOTE — Telephone Encounter (Signed)
Called pt to let her know that labs were normal. I am sending her a copy in the mail.

## 2014-07-23 NOTE — Telephone Encounter (Signed)
Left msg on triage stating never received lab results. Requesting labs results & copy mail to her...Johny Chess

## 2014-07-25 ENCOUNTER — Ambulatory Visit: Payer: Medicare Other

## 2014-07-25 ENCOUNTER — Ambulatory Visit: Payer: Medicare Other | Admitting: Oncology

## 2014-07-29 ENCOUNTER — Other Ambulatory Visit: Payer: Self-pay | Admitting: Internal Medicine

## 2014-07-29 DIAGNOSIS — Z1231 Encounter for screening mammogram for malignant neoplasm of breast: Secondary | ICD-10-CM

## 2014-07-30 ENCOUNTER — Encounter: Payer: Self-pay | Admitting: Oncology

## 2014-07-30 ENCOUNTER — Telehealth: Payer: Self-pay | Admitting: Oncology

## 2014-07-30 ENCOUNTER — Ambulatory Visit: Payer: Medicare Other

## 2014-07-30 ENCOUNTER — Ambulatory Visit (HOSPITAL_BASED_OUTPATIENT_CLINIC_OR_DEPARTMENT_OTHER): Payer: Medicare Other | Admitting: Oncology

## 2014-07-30 VITALS — BP 131/80 | HR 79 | Temp 98.2°F | Resp 18 | Ht 63.5 in | Wt 126.2 lb

## 2014-07-30 DIAGNOSIS — R1319 Other dysphagia: Secondary | ICD-10-CM | POA: Diagnosis not present

## 2014-07-30 DIAGNOSIS — C49A0 Gastrointestinal stromal tumor, unspecified site: Secondary | ICD-10-CM | POA: Insufficient documentation

## 2014-07-30 DIAGNOSIS — N390 Urinary tract infection, site not specified: Secondary | ICD-10-CM | POA: Diagnosis not present

## 2014-07-30 DIAGNOSIS — C155 Malignant neoplasm of lower third of esophagus: Secondary | ICD-10-CM | POA: Diagnosis not present

## 2014-07-30 DIAGNOSIS — C499 Malignant neoplasm of connective and soft tissue, unspecified: Secondary | ICD-10-CM

## 2014-07-30 NOTE — Progress Notes (Signed)
[facility] New Patient Consult   Referring MD: Goddess Gebbia 80 y.o.  [birth year]    Reason for Referral: Gastrointestinal stromal tumor of the esophagus   HPI: Ms. Bujak reports a history of dysphagia with pills for the past several years. The dysphagia is improved when she takes the pills with food. She was referred to Dr. Carlean Purl. A barium swallow on 04/29/2014 revealed a persistent filling defect in the mid to distal esophagus that appeared smoothly marginated. The filling defect measured approximately 3 cm and appeared Anaprox 0.6-8 cm from the gastroesophageal junction.  A CT of the chest on 05/03/2014 revealed a 2.6 and her mass at the right lateral wall of the distal thoracic esophagus. The mass was unchanged in size compared to a CT of the abdomen from March 2014. A second similar low-attenuation nodule measured 12 mm at the GE junction. This lesion is also stable. These were felt to most likely represent bronchogenic cysts.  Ms. Podgorski was referred to Dr. Ardis Hughs for an endoscopic ultrasound on 05/16/2014. The esophagus mucosa was smoothly indented at 30 cm from the incisors causing slight luminal narrowing. The upper GI tract otherwise appeared normal. On ultrasound a 2.6 and your mass appeared to involve the muscular propria layer of the esophagus wall at 30 cm. The mass was biopsied. A similar appearing smaller lesion measuring 9 mm was noted at the GE junction.  The pathology ([id number]) confirmed a spindle cell neoplasm. The tumor stained positive for CD117 and CD34. Negative for SMA. The findings were diagnostic for a gastrointestinal stromal tumor.  She no longer has solid dysphagia.  Ms. Massi saw Dr. Servando Snare. He does not recommend surgical intervention for management of the GIST or a thoracic aneurysm noted on the 05/03/2014 CT.    Past Medical History  Diagnosis Date  . DYSLIPIDEMIA   . HYPERTENSION   . MITRAL VALVE PROLAPSE   .  OSTEOARTHRITIS   . OSTEOPOROSIS   . COLONIC POLYPS, HX OF   . Chronic cystitis   . GERD (gastroesophageal reflux disease)   . Macular degeneration     optho q1mo - [facility]  . Meningioma   . NEPHROLITHIASIS   . Anxiety   . Bronchitis   . Diverticulosis   . Hemorrhoids   . Benign fundic gland polyps of stomach   . Hiatal hernia   . Gastritis   . Complication of anesthesia   . PONV (postoperative nausea and vomiting)   . Cancer-squamous cell carcinoma of the left leg   05/01/2014        . gastrointestinal stromal tumor (GIST)-esophagus   05/16/2014   . Thoracic aortic aneurysm  05/03/2014     4.1 cm    .   G2 P2   .   History of migraine headaches   .   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 Cholecystectomy    . Abdominal hysterectomy    . Appendectomy    . Knee surgery    . Cataract surgery  2000  . Surgery of lip  in 2002  . Breast surgery    . Tonsillectomy    . Ventral hernia repair  07/04/2012    Procedure: HERNIA REPAIR VENTRAL ADULT;  Surgeon: Odis Hollingshead, MD;  Location: [facility];  Service: General;  Laterality: N/A;  . Insertion of mesh  07/04/2012    Procedure: INSERTION OF MESH;  Surgeon: Odis Hollingshead,  MD;  Location: [facility];  Service: General;  Laterality: N/A;  . Leg skin lesion  biopsy / excision Left  05/01/2014      squamous cell carcinoma   . Eus N/A 05/16/2014    Procedure: UPPER ENDOSCOPIC ULTRASOUND (EUS) LINEAR;  Surgeon: Milus Banister, MD;  Location: [facility];  Service: Endoscopy;  Laterality: N/A;    Medications: Reviewed  Allergies:  Allergies  Allergen Reactions  . Pentothal [Thiopental] Nausea And Vomiting  . Codeine   . Levofloxacin   . Oxycodone-Acetaminophen   . Sulfonamide Derivatives     REACTION: GI upset/nausea/vomiting  . Tape Rash    Per patient adhesive tape    Family history: 2 brothers and 2 sisters. One sister died of breast cancer dates 66. A brother died of prostate  cancer at age 15. No other family history of cancer  Social History:   She is a Agricultural engineer. She lives with her husband and [place]. She golfs regularly. She does not use cigarettes. She drinks 1 glass of wine per day. No transfusion history.    ROS:   Positives include: Pill dysphagia-improved when she takes pills with food, history of a nonproductive cough-improved, intermittent urinary tract infections  A complete ROS was otherwise negative.  Physical Exam:  Blood pressure 131/80, pulse 79, temperature 98.2 F (36.8 C), temperature source Oral, resp. rate 18, height 5' 3.5" (1.613 m), weight 126 lb 3.2 oz (57.244 kg).  HEENT: Oropharynx without visible mass, neck without mass Lungs: Clear bilaterally Cardiac: Regular rate and rhythm Abdomen: No hepatomegaly, no mass, nontender, no apparent ascites, no spinal megaly  Vascular: No leg edema Lymph nodes: No cervical, supraclavicular, right axillary, or inguinal nodes. Pea-sized mobile left axillary node Neurologic: Alert and oriented, the motor exam appears intact in the upper and lower extremities Skin: Ecchymosis and hematoma at the right pretibial area Musculoskeletal: No spine tenderness   LAB:  CBC  Lab Results  Component Value Date   WBC 10.0 07/12/2014   HGB 14.2 07/12/2014   HCT 43.0 07/12/2014   MCV 92.5 07/12/2014   PLT 209.0 07/12/2014   NEUTROABS 4.7 06/22/2013     CMP      Component Value Date/Time   NA 138 07/12/2014 0950   K 4.4 07/12/2014 0950   CL 103 07/12/2014 0950   CO2 27 07/12/2014 0950   GLUCOSE 96 07/12/2014 0950   BUN 17 07/12/2014 0950   CREATININE 0.6 07/12/2014 0950   CALCIUM 9.7 07/12/2014 0950   PROT 7.1 06/22/2013 0854   ALBUMIN 4.4 06/22/2013 0854   AST 23 06/22/2013 0854   ALT 21 06/22/2013 0854   ALKPHOS 73 06/22/2013 0854   BILITOT 0.9 06/22/2013 0854   GFRNONAA 85* 06/28/2012 1033   GFRAA >90 06/28/2012 1033      Imaging:  CT chest from  05/03/2014-reviewed   Assessment/Plan:   1.  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  History of pill dysphagia secondary to #1  3.  Chronic urinary tract infections  4.  Squamous cell carcinoma of the left lower leg August 2015   Disposition:   Ms. Maggio has been diagnosed with a gastrointestinal stromal tumor of the distal esophagus. This likely caused pill dysphagia in the past, but she is asymptomatic at present. There is a smaller GIST at the  GE junction.  I discussed treatment options with Ms. Wogan and her husband. She understands the only curative therapy is surgery. I agree with Dr. Servando Snare that surgery is not indicated in her case. The lesion has been present for at least the past one and one half years and has not changed significantly.  I recommend observation since she is asymptomatic. We can consider systemic treatment with Gleevec if she develops progressive dysphagia.  Ms. Oyer is comfortable with observation. She will return for an office visit in 4 months.  [place], [place] 07/30/2014, 5:03 PM

## 2014-07-30 NOTE — Telephone Encounter (Signed)
Pt confirmed MD visit per 11/24 POF, gave pt AVS.... KJ

## 2014-08-19 DIAGNOSIS — L570 Actinic keratosis: Secondary | ICD-10-CM | POA: Diagnosis not present

## 2014-08-19 DIAGNOSIS — Z08 Encounter for follow-up examination after completed treatment for malignant neoplasm: Secondary | ICD-10-CM | POA: Diagnosis not present

## 2014-08-19 DIAGNOSIS — Z85828 Personal history of other malignant neoplasm of skin: Secondary | ICD-10-CM | POA: Diagnosis not present

## 2014-08-20 ENCOUNTER — Ambulatory Visit (HOSPITAL_COMMUNITY)
Admission: RE | Admit: 2014-08-20 | Discharge: 2014-08-20 | Disposition: A | Discharge status: Home / Self Care | Payer: Medicare Other | Source: Ambulatory Visit | Attending: Internal Medicine | Admitting: Internal Medicine

## 2014-08-20 DIAGNOSIS — Z1231 Encounter for screening mammogram for malignant neoplasm of breast: Secondary | ICD-10-CM | POA: Insufficient documentation

## 2014-08-28 DIAGNOSIS — H35363 Drusen (degenerative) of macula, bilateral: Secondary | ICD-10-CM | POA: Diagnosis not present

## 2014-08-28 DIAGNOSIS — H43811 Vitreous degeneration, right eye: Secondary | ICD-10-CM | POA: Diagnosis not present

## 2014-08-28 DIAGNOSIS — H3531 Nonexudative age-related macular degeneration: Secondary | ICD-10-CM | POA: Diagnosis not present

## 2014-09-24 ENCOUNTER — Telehealth: Payer: Self-pay | Admitting: Cardiology

## 2014-09-24 NOTE — Telephone Encounter (Signed)
Closed encounter °

## 2014-10-07 ENCOUNTER — Emergency Department (HOSPITAL_COMMUNITY): Payer: Medicare Other

## 2014-10-07 ENCOUNTER — Encounter (HOSPITAL_COMMUNITY): Payer: Self-pay | Admitting: Emergency Medicine

## 2014-10-07 ENCOUNTER — Emergency Department (HOSPITAL_COMMUNITY)
Admission: EM | Admit: 2014-10-07 | Discharge: 2014-10-07 | Disposition: A | Discharge status: Home / Self Care | Payer: Medicare Other | Attending: Emergency Medicine | Admitting: Emergency Medicine

## 2014-10-07 DIAGNOSIS — Y92311 Squash court as the place of occurrence of the external cause: Secondary | ICD-10-CM | POA: Diagnosis not present

## 2014-10-07 DIAGNOSIS — Z8669 Personal history of other diseases of the nervous system and sense organs: Secondary | ICD-10-CM | POA: Diagnosis not present

## 2014-10-07 DIAGNOSIS — Y998 Other external cause status: Secondary | ICD-10-CM | POA: Diagnosis not present

## 2014-10-07 DIAGNOSIS — M25512 Pain in left shoulder: Secondary | ICD-10-CM | POA: Diagnosis not present

## 2014-10-07 DIAGNOSIS — Z79899 Other long term (current) drug therapy: Secondary | ICD-10-CM | POA: Diagnosis not present

## 2014-10-07 DIAGNOSIS — Z87442 Personal history of urinary calculi: Secondary | ICD-10-CM | POA: Diagnosis not present

## 2014-10-07 DIAGNOSIS — I10 Essential (primary) hypertension: Secondary | ICD-10-CM | POA: Insufficient documentation

## 2014-10-07 DIAGNOSIS — S0990XA Unspecified injury of head, initial encounter: Secondary | ICD-10-CM | POA: Diagnosis not present

## 2014-10-07 DIAGNOSIS — E785 Hyperlipidemia, unspecified: Secondary | ICD-10-CM | POA: Insufficient documentation

## 2014-10-07 DIAGNOSIS — M810 Age-related osteoporosis without current pathological fracture: Secondary | ICD-10-CM | POA: Diagnosis not present

## 2014-10-07 DIAGNOSIS — M1990 Unspecified osteoarthritis, unspecified site: Secondary | ICD-10-CM | POA: Insufficient documentation

## 2014-10-07 DIAGNOSIS — Z86011 Personal history of benign neoplasm of the brain: Secondary | ICD-10-CM | POA: Insufficient documentation

## 2014-10-07 DIAGNOSIS — I712 Thoracic aortic aneurysm, without rupture: Secondary | ICD-10-CM | POA: Diagnosis not present

## 2014-10-07 DIAGNOSIS — Z7982 Long term (current) use of aspirin: Secondary | ICD-10-CM | POA: Diagnosis not present

## 2014-10-07 DIAGNOSIS — W010XXA Fall on same level from slipping, tripping and stumbling without subsequent striking against object, initial encounter: Secondary | ICD-10-CM | POA: Diagnosis not present

## 2014-10-07 DIAGNOSIS — S4992XA Unspecified injury of left shoulder and upper arm, initial encounter: Secondary | ICD-10-CM | POA: Diagnosis not present

## 2014-10-07 DIAGNOSIS — F419 Anxiety disorder, unspecified: Secondary | ICD-10-CM | POA: Diagnosis not present

## 2014-10-07 DIAGNOSIS — K219 Gastro-esophageal reflux disease without esophagitis: Secondary | ICD-10-CM | POA: Diagnosis not present

## 2014-10-07 DIAGNOSIS — Z86010 Personal history of colonic polyps: Secondary | ICD-10-CM | POA: Insufficient documentation

## 2014-10-07 DIAGNOSIS — Y9353 Activity, golf: Secondary | ICD-10-CM | POA: Diagnosis not present

## 2014-10-07 DIAGNOSIS — Z85828 Personal history of other malignant neoplasm of skin: Secondary | ICD-10-CM | POA: Diagnosis not present

## 2014-10-07 MED ORDER — ACETAMINOPHEN 325 MG PO TABS
650.0000 mg | ORAL_TABLET | Freq: Once | ORAL | Status: AC
  Administered 2014-10-07: 650 mg via ORAL
  Filled 2014-10-07: qty 2

## 2014-10-07 NOTE — ED Notes (Signed)
Pt states she tripped and fell on her left shoulder, states she did hit her head "a little but it doesn't hurt," denies LOC.

## 2014-10-07 NOTE — Discharge Instructions (Signed)
Arthralgia °Your caregiver has diagnosed you as suffering from an arthralgia. Arthralgia means there is pain in a joint. This can come from many reasons including: °· Bruising the joint which causes soreness (inflammation) in the joint. °· Wear and tear on the joints which occur as we grow older (osteoarthritis). °· Overusing the joint. °· Various forms of arthritis. °· Infections of the joint. °Regardless of the cause of pain in your joint, most of these different pains respond to anti-inflammatory drugs and rest. The exception to this is when a joint is infected, and these cases are treated with antibiotics, if it is a bacterial infection. °HOME CARE INSTRUCTIONS  °· Rest the injured area for as long as directed by your caregiver. Then slowly start using the joint as directed by your caregiver and as the pain allows. Crutches as directed may be useful if the ankles, knees or hips are involved. If the knee was splinted or casted, continue use and care as directed. If an stretchy or elastic wrapping bandage has been applied today, it should be removed and re-applied every 3 to 4 hours. It should not be applied tightly, but firmly enough to keep swelling down. Watch toes and feet for swelling, bluish discoloration, coldness, numbness or excessive pain. If any of these problems (symptoms) occur, remove the ace bandage and re-apply more loosely. If these symptoms persist, contact your caregiver or return to this location. °· For the first 24 hours, keep the injured extremity elevated on pillows while lying down. °· Apply ice for 15-20 minutes to the sore joint every couple hours while awake for the first half day. Then 03-04 times per day for the first 48 hours. Put the ice in a plastic bag and place a towel between the bag of ice and your skin. °· Wear any splinting, casting, elastic bandage applications, or slings as instructed. °· Only take over-the-counter or prescription medicines for pain, discomfort, or fever as  directed by your caregiver. Do not use aspirin immediately after the injury unless instructed by your physician. Aspirin can cause increased bleeding and bruising of the tissues. °· If you were given crutches, continue to use them as instructed and do not resume weight bearing on the sore joint until instructed. °Persistent pain and inability to use the sore joint as directed for more than 2 to 3 days are warning signs indicating that you should see a caregiver for a follow-up visit as soon as possible. Initially, a hairline fracture (break in bone) may not be evident on X-rays. Persistent pain and swelling indicate that further evaluation, non-weight bearing or use of the joint (use of crutches or slings as instructed), or further X-rays are indicated. X-rays may sometimes not show a small fracture until a week or 10 days later. Make a follow-up appointment with your own caregiver or one to whom we have referred you. A radiologist (specialist in reading X-rays) may read your X-rays. Make sure you know how you are to obtain your X-ray results. Do not assume everything is normal if you do not hear from us. °SEEK MEDICAL CARE IF: °Bruising, swelling, or pain increases. °SEEK IMMEDIATE MEDICAL CARE IF:  °· Your fingers or toes are numb or blue. °· The pain is not responding to medications and continues to stay the same or get worse. °· The pain in your joint becomes severe. °· You develop a fever over 102° F (38.9° C). °· It becomes impossible to move or use the joint. °MAKE SURE YOU:  °·   Understand these instructions.  Will watch your condition.  Will get help right away if you are not doing well or get worse. Document Released: 08/23/2005 Document Revised: 11/15/2011 Document Reviewed: 04/10/2008 [facility] Patient Information 2015 [place], [place]. This information is not intended to replace advice given to you by your health care provider. Make sure you discuss any questions you have with your health care  provider.   You were evaluated in the ED today for your shoulder discomfort after fall. There does not appear to be any emergent causes for your discomfort at this time. Your x-rays of your shoulder were negative for fracture or dislocation. The CT scan of your head was also negative for any bleeds. Please follow-up with your orthopedist and primary care for further evaluation and management of your symptoms. Return to ED for worsening symptoms. Continue to take Tylenol for any discomfort you experience.

## 2014-10-07 NOTE — ED Provider Notes (Signed)
CSN: [id number]     Arrival date & time 10/07/14  1520 History   First MD Initiated Contact with Patient 10/07/14 1659     Chief Complaint  Patient presents with  . Shoulder Injury     (Consider location/radiation/quality/duration/timing/severity/associated sxs/prior Treatment) HPI [name] is a 81 y.o. female comes in for evaluation of left shoulder injury following a fall. Patient states approximately 2:00 this afternoon she was playing golf, when someone called to her and when she looked up she tripped over one of the wooden spikes in the tee box. She reports falling forward onto her left shoulder and "barely tapping my head on the ground". She reports 7/10 throbbing pain in her left shoulder at this time. The pain is better at rest but is worsened with movement. She reports upon movement of her left shoulder the pain is 10/10. She has not taken anything for this discomfort. She does report associated nausea that she attributes to date shoulder pain. She denies headache, vision changes, loss of consciousness, vomiting, chest pain, short of breath, cough, abdominal pain, diarrhea or constipation, numbness or weakness.   She does request Tylenol for the pain.  Past Medical History  Diagnosis Date  . DYSLIPIDEMIA   . HYPERTENSION   . MITRAL VALVE PROLAPSE   . OSTEOARTHRITIS   . OSTEOPOROSIS   . COLONIC POLYPS, HX OF   . Chronic cystitis   . GERD (gastroesophageal reflux disease)   . Macular degeneration     optho q59mo - [facility]  . Meningioma   . NEPHROLITHIASIS   . Anxiety   . Bronchitis   . Diverticulosis   . Hemorrhoids   . Benign fundic gland polyps of stomach   . Hiatal hernia   . Gastritis   . Complication of anesthesia   . PONV (postoperative nausea and vomiting)   . Cancer     recent skin cancer left leg  excised about 8 days ago-remains with dressing intact.   . Benign gastrointestinal stromal tumor (GIST)   . Thoracic aortic aneurysm     4.1 cm   Past Surgical  History  Procedure Laterality Date  . Cholecystectomy    . Abdominal hysterectomy    . Appendectomy    . Knee surgery    . Cataract surgery  2000  . Surgery of lip  in 2002  . Breast surgery    . Tonsillectomy    . Ventral hernia repair  07/04/2012    Procedure: HERNIA REPAIR VENTRAL ADULT;  Surgeon: Odis Hollingshead, MD;  Location: [facility];  Service: General;  Laterality: N/A;  . Insertion of mesh  07/04/2012    Procedure: INSERTION OF MESH;  Surgeon: Odis Hollingshead, MD;  Location: [facility];  Service: General;  Laterality: N/A;  . Leg skin lesion  biopsy / excision Left     8 days ago pending pathology, remains with dressing.  . Eus N/A 05/16/2014    Procedure: UPPER ENDOSCOPIC ULTRASOUND (EUS) LINEAR;  Surgeon: Milus Banister, MD;  Location: [facility];  Service: Endoscopy;  Laterality: N/A;   Family History  Problem Relation Age of Onset  . Heart disease Mother   . Heart disease Father   . Breast cancer Sister   . Heart disease Other   . Emphysema Father   . Lung cancer Sister    History  Substance Use Topics  . Smoking status: Never Smoker   . Smokeless tobacco: Never Used     Comment: Married, lives  with spouse, Enjoys golf. Retired  . Alcohol Use: Yes     Comment: Wine -nightly   OB History    No data available     Review of Systems  All other systems reviewed and are negative. A 10 point review of systems was completed and was negative except for pertinent positives and negatives as mentioned in the history of present illness      Allergies  Pentothal; Codeine; Levofloxacin; Oxycodone-acetaminophen; Sulfonamide derivatives; and Tape  Home Medications   Prior to Admission medications   Medication Sig Start Date End Date Taking? Authorizing Provider  amoxicillin (AMOXIL) 500 MG capsule 4 capsules prior to dental work as needed   Yes Historical Provider, MD  Ascorbic Acid (VITAMIN C) 500 MG tablet Take 500 mg by mouth daily.     Yes Historical Provider, MD   aspirin 81 MG tablet Take 81 mg by mouth at bedtime.    Yes Historical Provider, MD  calcium-vitamin D (CALCIUM 500+D) 500-200 MG-UNIT per tablet Take 1 tablet by mouth daily.     Yes Historical Provider, MD  chlorpheniramine (CHLOR-TRIMETON) 4 MG tablet Take 4 mg by mouth every 4 (four) hours as needed. Sinus/allegries   Yes Historical Provider, MD  Cholecalciferol (VITAMIN D3) 2000 UNITS TABS Take 2,000 Units by mouth daily.     Yes Historical Provider, MD  esomeprazole (NEXIUM) 40 MG capsule Take 30- 60 min before your first and last meals of the day 01/16/14  Yes Tammy S Parrett, NP  losartan (COZAAR) 50 MG tablet Take 50 mg by mouth every morning.   Yes Historical Provider, MD  Lutein-Zeaxanthin 20-1 MG CAPS Take 1 capsule by mouth daily.   Yes Historical Provider, MD  Multiple Vitamins-Minerals (CENTRUM SILVER) tablet Take 1 tablet by mouth daily.     Yes Historical Provider, MD  nitrofurantoin (MACRODANTIN) 100 MG capsule Take 100 mg by mouth at bedtime.   Yes Historical Provider, MD  simvastatin (ZOCOR) 5 MG tablet Take 1 tablet (5 mg total) by mouth at bedtime. 07/15/14  Yes Rowe Clack, MD  vitamin E 400 UNIT capsule Take 400 Units by mouth every morning.   Yes Historical Provider, MD  denosumab (PROLIA) 60 MG/ML SOLN injection Inject 60 mg into the skin every 6 (six) months. Administer in upper arm, thigh, or abdomen 06/22/13   Rowe Clack, MD  fosfomycin (MONUROL) 3 G PACK Take 3 g by mouth once. Patient not taking: Reported on 10/07/2014 07/12/14   Mauricio Po, FNP   BP 152/74 mmHg  Pulse 74  Temp(Src) 97.9 F (36.6 C) (Oral)  Resp 16  SpO2 96% Physical Exam  Constitutional: She is oriented to person, place, and time. She appears well-developed and well-nourished.  HENT:  Head: Normocephalic and atraumatic.  Mouth/Throat: Oropharynx is clear and moist.  Eyes: Conjunctivae are normal. Pupils are equal, round, and reactive to light. Right eye exhibits no discharge.  Left eye exhibits no discharge. No scleral icterus.  Neck: Neck supple.  Cardiovascular: Normal rate, regular rhythm, normal heart sounds and intact distal pulses.   Pulmonary/Chest: Effort normal and breath sounds normal. No respiratory distress. She has no wheezes. She has no rales.  Abdominal: Soft. There is no tenderness.  Musculoskeletal:  Full passive range of motion of the left shoulder. There is diffuse tenderness. No erythema or edema. No evidence of hemarthrosis. No crepitus Full active ROM of L elbow and wrist, without tenderness.  Neurological: She is alert and oriented to person, place, and  time.  Cranial Nerves II-XII grossly intact. Grip strength intact and equal bil. Sensation intact. Strength in LUE is decreased due to shoulder discomfort. Gait is baseline without ataxia.   Skin: Skin is warm and dry. No rash noted.  Psychiatric: She has a normal mood and affect.  Nursing note and vitals reviewed.   ED Course  Procedures (including critical care time) Labs Review Labs Reviewed - No data to display  Imaging Review Ct Head Wo Contrast  10/07/2014   CLINICAL DATA:  Fall playing golf  EXAM: CT HEAD WITHOUT CONTRAST  TECHNIQUE: Contiguous axial images were obtained from the base of the skull through the vertex without intravenous contrast.  COMPARISON:  None.  FINDINGS: There is a calcified meningioma in the left frontal region. There is atrophy and chronic small vessel disease changes. No acute intracranial abnormality. Specifically, no hemorrhage, hydrocephalus, mass lesion, acute infarction, or significant intracranial injury. No acute calvarial abnormality.  IMPRESSION: No acute intracranial abnormality.  Atrophy, chronic microvascular disease.   Electronically Signed   By: Rolm Baptise M.D.   On: 10/07/2014 18:42   Dg Shoulder Left  10/07/2014   CLINICAL DATA:  81 year old female with generalized left shoulder pain after tripping and falling while playing golf earlier today.   EXAM: LEFT SHOULDER - 2+ VIEW  COMPARISON:  None.  FINDINGS: There is no evidence of fracture or dislocation. There is no evidence of arthropathy or other focal bone abnormality. Soft tissues are unremarkable.  IMPRESSION: Negative.   Electronically Signed   By: Jacqulynn Cadet M.D.   On: 10/07/2014 16:03     EKG Interpretation None     Meds given in ED:  Medications  acetaminophen (TYLENOL) tablet 650 mg (650 mg Oral Given 10/07/14 1816)    Discharge Medication List as of 10/07/2014  6:52 PM     Filed Vitals:   10/07/14 1522 10/07/14 1818  BP: 114/68 152/74  Pulse: 87 74  Temp: 97.9 F (36.6 C)   TempSrc: Oral   Resp: 16 16  SpO2: 97% 96%    MDM  Vitals stable - WNL -afebrile Pt resting comfortably in ED. Tylenol improved shoulder discomfort. Pt refuses arm sling. PE--Not concerning for other acute or emergent pathology. NVI, DP 2+  Due to pt fall, ASA therapy, will obtain CT head to r/o subdural hematoma or other intracranial hemorrhage Imaging-CT head shows no acute intracranial abnormality. Xray L shoulder showed no acute fracture or dislocation.  Reports she has an orthopedist that she will be able to f/u with tomorrow. Lives with her husband and remains very independent. Reports she does not want other pain medicines, will cont Tylenol at home.  I discussed all relevant lab findings and imaging results with pt and they verbalized understanding. Discussed f/u with PCP within 48 hrs and return precautions, pt very amenable to plan.  Prior to patient discharge, I discussed and reviewed this case with Dr. Regenia Skeeter, who also saw and evaluated the patient.  Final diagnoses:  Shoulder pain, acute, left       Verl Dicker, PA-C 10/08/14 [place], MD 10/12/14 [phone]

## 2014-10-09 DIAGNOSIS — S4992XA Unspecified injury of left shoulder and upper arm, initial encounter: Secondary | ICD-10-CM | POA: Diagnosis not present

## 2014-11-06 ENCOUNTER — Telehealth: Payer: Self-pay | Admitting: Oncology

## 2014-11-06 NOTE — Telephone Encounter (Signed)
Pt called to cancel MD visit states she won't be here and that she will c/b to r/s when she gets back... KJ

## 2014-11-30 ENCOUNTER — Other Ambulatory Visit: Payer: Self-pay | Admitting: Cardiology

## 2014-12-02 ENCOUNTER — Other Ambulatory Visit: Payer: Self-pay

## 2014-12-02 MED ORDER — LOSARTAN POTASSIUM 50 MG PO TABS: 50.0000 mg | ORAL_TABLET | Freq: Every morning | ORAL | Status: DC

## 2014-12-03 ENCOUNTER — Ambulatory Visit: Payer: Medicare Other | Admitting: Oncology

## 2014-12-10 ENCOUNTER — Encounter: Payer: Self-pay | Admitting: Internal Medicine

## 2014-12-10 ENCOUNTER — Telehealth: Payer: Self-pay | Admitting: Internal Medicine

## 2014-12-10 ENCOUNTER — Ambulatory Visit (INDEPENDENT_AMBULATORY_CARE_PROVIDER_SITE_OTHER): Payer: Medicare Other | Admitting: Internal Medicine

## 2014-12-10 ENCOUNTER — Other Ambulatory Visit: Payer: Self-pay | Admitting: Internal Medicine

## 2014-12-10 ENCOUNTER — Telehealth: Payer: Self-pay | Admitting: *Deleted

## 2014-12-10 VITALS — BP 140/80 | HR 73 | Temp 98.2°F | Resp 18 | Ht 63.5 in | Wt 124.0 lb

## 2014-12-10 DIAGNOSIS — I10 Essential (primary) hypertension: Secondary | ICD-10-CM

## 2014-12-10 DIAGNOSIS — J4520 Mild intermittent asthma, uncomplicated: Secondary | ICD-10-CM

## 2014-12-10 DIAGNOSIS — L049 Acute lymphadenitis, unspecified: Secondary | ICD-10-CM | POA: Diagnosis not present

## 2014-12-10 MED ORDER — AMOXICILLIN 500 MG PO CAPS: 1000.0000 mg | ORAL_CAPSULE | Freq: Two times a day (BID) | ORAL | Status: DC

## 2014-12-10 NOTE — Assessment & Plan Note (Signed)
stable overall by history and exam, recent data reviewed with pt, and pt to continue medical treatment as before,  to f/u any worsening symptoms or concerns SpO2 Readings from Last 3 Encounters:  12/10/14 95%  10/07/14 96%  07/12/14 95%

## 2014-12-10 NOTE — Patient Instructions (Addendum)
Please take all new medication as prescribed - the antibiotic  Please continue all other medications as before, and refills have been done if requested.  Please have the pharmacy call with any other refills you may need.  Please continue your efforts at being more active, low cholesterol diet, and weight control.  Please keep your appointments with your specialists as you may have planned  Please return in 3 months, or sooner if needed

## 2014-12-10 NOTE — Assessment & Plan Note (Signed)
Mild, right submandib only, likely oral or dental related? - for amoxil course,  to f/u any worsening symptoms or concerns

## 2014-12-10 NOTE — Progress Notes (Signed)
Pre visit review using our clinic review tool, if applicable. No additional management support is needed unless otherwise documented below in the visit note. 

## 2014-12-10 NOTE — Progress Notes (Signed)
Subjective:    Patient ID: Chelsea Jordan, female    DOB: 1927-06-29, 81 y.o.   MRN: [id number]  HPI  Here with 3 -4 days onset gradually worsening pain/swelling only to area of right neck under the jawline, but quite tender, no drainage, no overlying skin change or fluctuance. No prior hx. Did have dental work done recently, and still has a smallulceration to mucosa under right tongue near the frenulum. Pt denies chest pain, increased sob or doe, wheezing, orthopnea, PND, increased LE swelling, palpitations, dizziness or syncope.  Pt denies new neurological symptoms such as new headache, or facial or extremity weakness or numbness Past Medical History  Diagnosis Date  . DYSLIPIDEMIA   . HYPERTENSION   . MITRAL VALVE PROLAPSE   . OSTEOARTHRITIS   . OSTEOPOROSIS   . COLONIC POLYPS, HX OF   . Chronic cystitis   . GERD (gastroesophageal reflux disease)   . Macular degeneration     optho q85mo - [facility]  . Meningioma   . NEPHROLITHIASIS   . Anxiety   . Bronchitis   . Diverticulosis   . Hemorrhoids   . Benign fundic gland polyps of stomach   . Hiatal hernia   . Gastritis   . Complication of anesthesia   . PONV (postoperative nausea and vomiting)   . Cancer     recent skin cancer left leg  excised about 8 days ago-remains with dressing intact.   . Benign gastrointestinal stromal tumor (GIST)   . Thoracic aortic aneurysm     4.1 cm   Past Surgical History  Procedure Laterality Date  . Cholecystectomy    . Abdominal hysterectomy    . Appendectomy    . Knee surgery    . Cataract surgery  2000  . Surgery of lip  in 2002  . Breast surgery    . Tonsillectomy    . Ventral hernia repair  07/04/2012    Procedure: HERNIA REPAIR VENTRAL ADULT;  Surgeon: Odis Hollingshead, MD;  Location: [place];  Service: General;  Laterality: N/A;  . Insertion of mesh  07/04/2012    Procedure: INSERTION OF MESH;  Surgeon: Odis Hollingshead, MD;  Location: [place];  Service: General;  Laterality: N/A;  . Leg  skin lesion  biopsy / excision Left     8 days ago pending pathology, remains with dressing.  . Eus N/A 05/16/2014    Procedure: UPPER ENDOSCOPIC ULTRASOUND (EUS) LINEAR;  Surgeon: Milus Banister, MD;  Location: [facility];  Service: Endoscopy;  Laterality: N/A;    reports that she has never smoked. She has never used smokeless tobacco. She reports that she drinks alcohol. She reports that she does not use illicit drugs. family history includes Breast cancer in her sister; Emphysema in her father; Heart disease in her father, mother, and other; Lung cancer in her sister. Allergies  Allergen Reactions  . Pentothal [Thiopental] Nausea And Vomiting  . Codeine   . Levofloxacin   . Oxycodone-Acetaminophen   . Sulfonamide Derivatives     REACTION: GI upset/nausea/vomiting  . Tape Rash    Per patient adhesive tape   Current Outpatient Prescriptions on File Prior to Visit  Medication Sig Dispense Refill  . amoxicillin (AMOXIL) 500 MG capsule 4 capsules prior to dental work as needed    . Ascorbic Acid (VITAMIN C) 500 MG tablet Take 500 mg by mouth daily.      Marland Kitchen aspirin 81 MG tablet Take 81 mg by mouth at  bedtime.     . calcium-vitamin D (CALCIUM 500+D) 500-200 MG-UNIT per tablet Take 1 tablet by mouth daily.      . chlorpheniramine (CHLOR-TRIMETON) 4 MG tablet Take 4 mg by mouth every 4 (four) hours as needed. Sinus/allegries    . Cholecalciferol (VITAMIN D3) 2000 UNITS TABS Take 2,000 Units by mouth daily.      Marland Kitchen denosumab (PROLIA) 60 MG/ML SOLN injection Inject 60 mg into the skin every 6 (six) months. Administer in upper arm, thigh, or abdomen 1 mL 3  . esomeprazole (NEXIUM) 40 MG capsule Take 30- 60 min before your first and last meals of the day 180 capsule 4  . esomeprazole (NEXIUM) 40 MG capsule Take 1 capsule (40 mg total) by mouth 2 (two) times daily. 180 capsule 1  . losartan (COZAAR) 50 MG tablet Take 1 tablet (50 mg total) by mouth every morning. 30 tablet 6  . Lutein-Zeaxanthin  20-1 MG CAPS Take 1 capsule by mouth daily.    . Multiple Vitamins-Minerals (CENTRUM SILVER) tablet Take 1 tablet by mouth daily.      . nitrofurantoin (MACRODANTIN) 100 MG capsule Take 100 mg by mouth at bedtime.    . simvastatin (ZOCOR) 5 MG tablet Take 1 tablet (5 mg total) by mouth at bedtime. 90 tablet 3  . vitamin E 400 UNIT capsule Take 400 Units by mouth every morning.     No current facility-administered medications on file prior to visit.   Review of Systems  Constitutional: Negative for unusual diaphoresis or night sweats HENT: Negative for ringing in ear or discharge Eyes: Negative for double vision or worsening visual disturbance.  Respiratory: Negative for choking and stridor.   Gastrointestinal: Negative for vomiting or other signifcant bowel change Genitourinary: Negative for hematuria or change in urine volume.  Musculoskeletal: Negative for other MSK pain or swelling Skin: Negative for color change and worsening wound.  Neurological: Negative for tremors and numbness other than noted  Psychiatric/Behavioral: Negative for decreased concentration or agitation other than above       Objective:   Physical Exam BP 140/80 mmHg  Pulse 73  Temp(Src) 98.2 F (36.8 C) (Oral)  Resp 18  Ht 5' 3.5" (1.613 m)  Wt 124 lb 0.6 oz (56.264 kg)  BMI 21.63 kg/m2  SpO2 95% VS noted,  Constitutional: Pt appears in no significant distress HENT: Head: NCAT.  Right Ear: External ear normal.  Left Ear: External ear normal.  Eyes: . Pupils are equal, round, and reactive to light. Conjunctivae and EOM are normal Neck: Normal range of motion. Neck supple. but has mod tender enlarged mult small lymph nodes vs salivary glands right submandibular only, no other neck pass, lump or tenderness  Cardiovascular: Normal rate and regular rhythm.   Pulmonary/Chest: Effort normal and breath sounds without rales or wheezing.  Abd:  Soft, NT, ND, + BS - no HSM Neurological: Pt is alert. Not confused  , motor grossly intact Skin: Skin is warm. No rash, no LE edema Psychiatric: Pt behavior is normal. No agitation.     Assessment & Plan:

## 2014-12-10 NOTE — Telephone Encounter (Signed)
Patient Name: Chelsea Jordan DOB: November 13, 1926 Initial Comment Caller states Neck and Ear are painful and swollen now the thyroid gland is swollen Nurse Assessment Nurse: Ronnald Ramp, RN, Miranda Date/Time (Eastern Time): 12/10/2014 12:50:53 PM Confirm and document reason for call. If symptomatic, describe symptoms. ---Caller states she is having pain and swelling in her the glands of her throat and having pain in her right ear since 10am. Has the patient traveled out of the country within the last 30 days? ---No Does the patient require triage? ---Yes Related visit to physician within the last 2 weeks? ---No Does the PT have any chronic conditions? (i.e. diabetes, asthma, etc.) ---Yes List chronic conditions. ---HTN, Macular degeneration, GERD, Has a malignant tumor and aneurysm in her throat. Guidelines Guideline Title Affirmed Question Affirmed Notes Lymph Nodes Swollen [1] Single large node AND [2] size > 1 inch (2.5 cm) AND [3] no fever Final Disposition User See Physician within 24 Hours Ronnald Ramp, RN, Miranda Comments Appt scheduled at 315 today with Dr. Jenny Reichmann

## 2014-12-10 NOTE — Telephone Encounter (Signed)
[facility] - Client [facility] Patient Name: SHEKETA ENDE Gender: Female DOB: November 29, 1926 Age: 81 Y 34 M 3 D Return Phone Number: [phone] (Primary) Address: [address] City/State/Zip: [place] [address] Client [facility] - Client Client Site [place] - Day Physician [place], [place] Type Call Call Type Triage / Clinical Relationship To Patient Self Appointment Disposition EMR Appointment Scheduled Info pasted into Epic Yes Return Phone Number [phone] (Primary) Chief Complaint Lymph Node Swelling Initial Comment Caller states Neck and Ear are painful and swollen now the thyroid gland is swollen PreDisposition Call Doctor Nurse Assessment Nurse: Ronnald Ramp, RN, Miranda Date/Time (Eastern Time): 12/10/2014 12:50:53 PM Confirm and document reason for call. If symptomatic, describe symptoms. ---Caller states she is having pain and swelling in her the glands of her throat and having pain in her right ear since 10am. Has the patient traveled out of the country within the last 30 days? ---No Does the patient require triage? ---Yes Related visit to physician within the last 2 weeks? ---No Does the PT have any chronic conditions? (i.e. diabetes, asthma, etc.) ---Yes List chronic conditions. ---HTN, Macular degeneration, GERD, Has a malignant tumor and aneurysm in her throat. Guidelines Guideline Title Affirmed Question Affirmed Notes Nurse Date/Time Eilene Ghazi Time) Lymph Nodes Swollen [1] Single large node AND [2] size > 1 inch (2.5 cm) AND [3] no fever Ronnald Ramp, RN, Miranda 12/10/2014 12:53:45 PM Disp. Time Eilene Ghazi Time) Disposition Final User 12/10/2014 12:56:28 PM See Physician within 24 Hours Yes Ronnald Ramp, RN, Judge Stall Understands: Yes Disagree/Comply: Comply PLEASE NOTE: All timestamps contained within this report are represented as [place] Standard  Time. CONFIDENTIALTY NOTICE: This fax transmission is intended only for the addressee. It contains information that is legally privileged, confidential or otherwise protected from use or disclosure. If you are not the intended recipient, you are strictly prohibited from reviewing, disclosing, copying using or disseminating any of this information or taking any action in reliance on or regarding this information. If you have received this fax in error, please notify us immediately by telephone so that we can arrange for its return to [place]. Phone: [phone], Toll-Free: [phone], Fax: [phone] Page: 2 of 2 Call Id: [id number] Care Advice Given Per Guideline SEE PHYSICIAN WITHIN 24 HOURS: PAIN MEDICINES: ACETAMINOPHEN (E.G., TYLENOL): IBUPROFEN (E.G., MOTRIN, ADVIL): CALL BACK IF: * You become worse. CARE ADVICE given per Lymph Nodes Swollen (Adult) guideline. After Care Instructions Given Call Event Type User Date / Time Description Comments User: Leverne Humbles, RN Date/Time Eilene Ghazi Time): 12/10/2014 12:57:39 PM Appt scheduled at 315 today with Dr. Jenny Reichmann Referrals REFERRED TO PCP OFFICE

## 2014-12-10 NOTE — Assessment & Plan Note (Signed)
stable overall by history and exam, recent data reviewed with pt, and pt to continue medical treatment as before,  to f/u any worsening symptoms or concerns BP Readings from Last 3 Encounters:  12/10/14 140/80  10/07/14 152/74  07/30/14 131/80

## 2014-12-11 ENCOUNTER — Telehealth: Payer: Self-pay | Admitting: Internal Medicine

## 2014-12-11 MED ORDER — AMOXICILLIN 500 MG PO CAPS: 1000.0000 mg | ORAL_CAPSULE | Freq: Two times a day (BID) | ORAL | Status: DC

## 2014-12-11 NOTE — Telephone Encounter (Signed)
She came in to see Dr. Jenny Reichmann yesterday and they were supposed to send in a prescription to [facility] for amoxicillin (AMOXIL) 500 MG capsule [[id number] and they have not received it yet. Could you please send this in.

## 2014-12-11 NOTE — Telephone Encounter (Signed)
Resent to [facility]../l;mb 

## 2014-12-18 ENCOUNTER — Encounter: Payer: Self-pay | Admitting: Internal Medicine

## 2014-12-18 ENCOUNTER — Other Ambulatory Visit: Payer: Self-pay | Admitting: Internal Medicine

## 2014-12-18 ENCOUNTER — Ambulatory Visit (INDEPENDENT_AMBULATORY_CARE_PROVIDER_SITE_OTHER): Payer: Medicare Other | Admitting: Internal Medicine

## 2014-12-18 ENCOUNTER — Other Ambulatory Visit (INDEPENDENT_AMBULATORY_CARE_PROVIDER_SITE_OTHER): Payer: Medicare Other

## 2014-12-18 VITALS — BP 108/74 | HR 80 | Temp 97.9°F | Resp 16 | Ht 64.0 in | Wt 120.0 lb

## 2014-12-18 DIAGNOSIS — I950 Idiopathic hypotension: Secondary | ICD-10-CM

## 2014-12-18 DIAGNOSIS — R197 Diarrhea, unspecified: Secondary | ICD-10-CM

## 2014-12-18 DIAGNOSIS — R110 Nausea: Secondary | ICD-10-CM | POA: Insufficient documentation

## 2014-12-18 DIAGNOSIS — R531 Weakness: Secondary | ICD-10-CM | POA: Diagnosis not present

## 2014-12-18 DIAGNOSIS — I959 Hypotension, unspecified: Secondary | ICD-10-CM | POA: Insufficient documentation

## 2014-12-18 LAB — CBC WITH DIFFERENTIAL/PLATELET
BASOS ABS: 0 10*3/uL (ref 0.0–0.1)
BASOS PCT: 0.3 % (ref 0.0–3.0)
EOS ABS: 0.1 10*3/uL (ref 0.0–0.7)
Eosinophils Relative: 1.1 % (ref 0.0–5.0)
HCT: 42 % (ref 36.0–46.0)
HEMOGLOBIN: 14.3 g/dL (ref 12.0–15.0)
LYMPHS ABS: 1.3 10*3/uL (ref 0.7–4.0)
LYMPHS PCT: 18 % (ref 12.0–46.0)
MCHC: 33.9 g/dL (ref 30.0–36.0)
MCV: 90.5 fl (ref 78.0–100.0)
MONO ABS: 0.9 10*3/uL (ref 0.1–1.0)
Monocytes Relative: 11.7 % (ref 3.0–12.0)
NEUTROS ABS: 5.1 10*3/uL (ref 1.4–7.7)
Neutrophils Relative %: 68.9 % (ref 43.0–77.0)
Platelets: 208 10*3/uL (ref 150.0–400.0)
RBC: 4.64 Mil/uL (ref 3.87–5.11)
RDW: 12.8 % (ref 11.5–15.5)
WBC: 7.4 10*3/uL (ref 4.0–10.5)

## 2014-12-18 LAB — BASIC METABOLIC PANEL
BUN: 12 mg/dL (ref 6–23)
CO2: 28 meq/L (ref 19–32)
CREATININE: 0.55 mg/dL (ref 0.40–1.20)
Calcium: 10 mg/dL (ref 8.4–10.5)
Chloride: 96 mEq/L (ref 96–112)
GFR: 111.42 mL/min (ref >60.00)
Glucose, Bld: 101 mg/dL — ABNORMAL HIGH (ref 70–99)
Potassium: 4.4 mEq/L (ref 3.5–5.1)
SODIUM: 131 meq/L — AB (ref 135–145)

## 2014-12-18 LAB — URINALYSIS, ROUTINE W REFLEX MICROSCOPIC
Hgb urine dipstick: NEGATIVE
KETONES UR: 15 — AB
Leukocytes, UA: NEGATIVE
Nitrite: NEGATIVE
UROBILINOGEN UA: 1 (ref 0.0–1.0)
Urine Glucose: NEGATIVE
pH: 5.5 (ref 5.0–8.0)

## 2014-12-18 LAB — HEPATIC FUNCTION PANEL
ALT: 11 U/L (ref 0–35)
AST: 14 U/L (ref 0–37)
Albumin: 4.3 g/dL (ref 3.5–5.2)
Alkaline Phosphatase: 82 U/L (ref 39–117)
BILIRUBIN DIRECT: 0.2 mg/dL (ref 0.0–0.3)
BILIRUBIN TOTAL: 0.8 mg/dL (ref 0.2–1.2)
Total Protein: 6.8 g/dL (ref 6.0–8.3)

## 2014-12-18 LAB — LIPASE: Lipase: 13 U/L (ref 11.0–59.0)

## 2014-12-18 MED ORDER — ONDANSETRON HCL 4 MG PO TABS: 4.0000 mg | ORAL_TABLET | Freq: Three times a day (TID) | ORAL | Status: DC | PRN

## 2014-12-18 MED ORDER — CEPHALEXIN 500 MG PO CAPS: 500.0000 mg | ORAL_CAPSULE | Freq: Four times a day (QID) | ORAL | Status: DC

## 2014-12-18 NOTE — Progress Notes (Signed)
Pt advised the rx has been called in, pt concerned about cost of this drug, not sure if its too expensive, i adised pt to call back if this rx is too expensive and we will notify dr Jenny Reichmann to see if he can prescribe something cheaper

## 2014-12-18 NOTE — Patient Instructions (Addendum)
Ok stop the losartan for now, as well as no need to finish the amoxil (ok to stop the amoxil)  Please drink more sips of fluids througout the day  Please take all new medication as prescribed - the nausea medication  Please also take a probiotic such as your husband takes daily  You may want to take immodium as needed as well  Please return for any worsening symptoms such as fever, vomiting, pain or blood  Please go to the LAB in the Basement (turn left off the elevator) for the tests to be done today  You will be contacted by phone if any changes need to be made immediately.  Otherwise, you will receive a letter about your results with an explanation, but please check with MyChart first.  Please remember to sign up for MyChart if you have not done so, as this will be important to you in the future with finding out test results, communicating by private email, and scheduling acute appointments online when needed.  Please return in 1 week, or sooner if needed

## 2014-12-18 NOTE — Progress Notes (Signed)
Subjective:    Patient ID: Chelsea Jordan, female    DOB: [birth year], 81 y.o.   MRN: [id number]  HPI  Here to f/u, right submandibular LA resolved, though only took amoxil 500 bid instead of 1000 bid by accident.  C/o new onset loose stools and nausea x 2 days, without worsening fever, vomiting, abd pain, dysphagia, or blood.  Pt denies chest pain, increased sob or doe, wheezing, orthopnea, PND, increased LE swelling, palpitations, dizziness or syncope. No cough.  Somewhat weak today Past Medical History  Diagnosis Date  . DYSLIPIDEMIA   . HYPERTENSION   . MITRAL VALVE PROLAPSE   . OSTEOARTHRITIS   . OSTEOPOROSIS   . COLONIC POLYPS, HX OF   . Chronic cystitis   . GERD (gastroesophageal reflux disease)   . Macular degeneration     optho q39mo - [facility]  . Meningioma   . NEPHROLITHIASIS   . Anxiety   . Bronchitis   . Diverticulosis   . Hemorrhoids   . Benign fundic gland polyps of stomach   . Hiatal hernia   . Gastritis   . Complication of anesthesia   . PONV (postoperative nausea and vomiting)   . Cancer     recent skin cancer left leg  excised about 8 days ago-remains with dressing intact.   . Benign gastrointestinal stromal tumor (GIST)   . Thoracic aortic aneurysm     4.1 cm   Past Surgical History  Procedure Laterality Date  . Cholecystectomy    . Abdominal hysterectomy    . Appendectomy    . Knee surgery    . Cataract surgery  2000  . Surgery of lip  in 2002  . Breast surgery    . Tonsillectomy    . Ventral hernia repair  07/04/2012    Procedure: HERNIA REPAIR VENTRAL ADULT;  Surgeon: Odis Hollingshead, MD;  Location: [facility];  Service: General;  Laterality: N/A;  . Insertion of mesh  07/04/2012    Procedure: INSERTION OF MESH;  Surgeon: Odis Hollingshead, MD;  Location: [place];  Service: General;  Laterality: N/A;  . Leg skin lesion  biopsy / excision Left     8 days ago pending pathology, remains with dressing.  . Eus N/A 05/16/2014    Procedure: UPPER ENDOSCOPIC  ULTRASOUND (EUS) LINEAR;  Surgeon: Milus Banister, MD;  Location: [facility];  Service: Endoscopy;  Laterality: N/A;    reports that she has never smoked. She has never used smokeless tobacco. She reports that she drinks alcohol. She reports that she does not use illicit drugs. family history includes Breast cancer in her sister; Emphysema in her father; Heart disease in her father, mother, and other; Lung cancer in her sister. Allergies  Allergen Reactions  . Pentothal [Thiopental] Nausea And Vomiting  . Codeine   . Levofloxacin   . Oxycodone-Acetaminophen   . Sulfonamide Derivatives     REACTION: GI upset/nausea/vomiting  . Tape Rash    Per patient adhesive tape   Current Outpatient Prescriptions on File Prior to Visit  Medication Sig Dispense Refill  . Ascorbic Acid (VITAMIN C) 500 MG tablet Take 500 mg by mouth daily.      Marland Kitchen aspirin 81 MG tablet Take 81 mg by mouth at bedtime.     . calcium-vitamin D (CALCIUM 500+D) 500-200 MG-UNIT per tablet Take 1 tablet by mouth daily.      . chlorpheniramine (CHLOR-TRIMETON) 4 MG tablet Take 4 mg by mouth every 4 (four)  hours as needed. Sinus/allegries    . Cholecalciferol (VITAMIN D3) 2000 UNITS TABS Take 2,000 Units by mouth daily.      Marland Kitchen denosumab (PROLIA) 60 MG/ML SOLN injection Inject 60 mg into the skin every 6 (six) months. Administer in upper arm, thigh, or abdomen 1 mL 3  . esomeprazole (NEXIUM) 40 MG capsule Take 30- 60 min before your first and last meals of the day 180 capsule 4  . esomeprazole (NEXIUM) 40 MG capsule Take 1 capsule (40 mg total) by mouth 2 (two) times daily. 180 capsule 1  . losartan (COZAAR) 50 MG tablet Take 1 tablet (50 mg total) by mouth every morning. 30 tablet 6  . Lutein-Zeaxanthin 20-1 MG CAPS Take 1 capsule by mouth daily.    . Multiple Vitamins-Minerals (CENTRUM SILVER) tablet Take 1 tablet by mouth daily.      . nitrofurantoin (MACRODANTIN) 100 MG capsule Take 100 mg by mouth at bedtime.    . simvastatin  (ZOCOR) 5 MG tablet Take 1 tablet (5 mg total) by mouth at bedtime. 90 tablet 3  . vitamin E 400 UNIT capsule Take 400 Units by mouth every morning.     No current facility-administered medications on file prior to visit.   Review of Systems  Constitutional: Negative for unusual diaphoresis or night sweats HENT: Negative for ringing in ear or discharge Eyes: Negative for double vision or worsening visual disturbance.  Respiratory: Negative for choking and stridor.   Genitourinary: Negative for hematuria or change in urine volume.  Musculoskeletal: Negative for other MSK pain or swelling Skin: Negative for color change and worsening wound.  Neurological: Negative for tremors and numbness other than noted  Psychiatric/Behavioral: Negative for decreased concentration or agitation other than above       Objective:   Physical Exam BP 108/74 mmHg  Pulse 80  Temp(Src) 97.9 F (36.6 C) (Oral)  Resp 16  Ht 5\' 4"  (1.626 m)  Wt 120 lb 0.6 oz (54.45 kg)  BMI 20.59 kg/m2  SpO2 99% BP Readings from Last 3 Encounters:  12/18/14 108/74  12/10/14 140/80  10/07/14 152/74   Wt Readings from Last 3 Encounters:  12/18/14 120 lb 0.6 oz (54.45 kg)  12/10/14 124 lb 0.6 oz (56.264 kg)  07/30/14 126 lb 3.2 oz (57.244 kg)  VS noted, mild ill, weaker appearing Constitutional: Pt appears in no significant distress HENT: Head: NCAT.  Right Ear: External ear normal.  Left Ear: External ear normal.  Eyes: . Pupils are equal, round, and reactive to light. Conjunctivae and EOM are normal Neck: Normal range of motion. Neck supple.  Cardiovascular: Normal rate and regular rhythm.   Pulmonary/Chest: Effort normal and breath sounds without rales or wheezing.  Abd:  Soft, NT, ND, + BS - benign Neurological: Pt is alert. Not confused , motor grossly intact Skin: Skin is warm. No rash, no LE edema Psychiatric: Pt behavior is normal. No agitation.     Assessment & Plan:

## 2014-12-19 ENCOUNTER — Telehealth: Payer: Self-pay | Admitting: *Deleted

## 2014-12-19 ENCOUNTER — Telehealth: Payer: Self-pay | Admitting: Cardiology

## 2014-12-19 MED ORDER — PROMETHAZINE HCL 12.5 MG PO TABS: 12.5000 mg | ORAL_TABLET | Freq: Three times a day (TID) | ORAL | Status: DC | PRN

## 2014-12-19 NOTE — Assessment & Plan Note (Signed)
?   antibx assoc vs other - pt afeb, benign abd exam, to stop the amoxil, for probiotic,  to f/u any worsening symptoms or concerns, call for any worsening for stool studies

## 2014-12-19 NOTE — Telephone Encounter (Signed)
Received fax pt requesting something less expensive for the Ondansetron. Co-pay is $85.00...Chelsea Jordan

## 2014-12-19 NOTE — Assessment & Plan Note (Signed)
For antiemetic prn,  to f/u any worsening symptoms or concerns  

## 2014-12-19 NOTE — Assessment & Plan Note (Signed)
For more po sips of fluids, BRAT diet, no heavy lifting or exertions,  to f/u any worsening symptoms or concerns

## 2014-12-19 NOTE — Telephone Encounter (Signed)
[facility] for phenergan prn - done erx

## 2014-12-19 NOTE — Telephone Encounter (Signed)
A user error has taken place:wrong patient...Chelsea Jordan

## 2014-12-19 NOTE — Assessment & Plan Note (Signed)
To hold losartan for now due to lower blood pressures, f/u 1 wk

## 2014-12-20 ENCOUNTER — Ambulatory Visit (INDEPENDENT_AMBULATORY_CARE_PROVIDER_SITE_OTHER): Payer: Medicare Other | Admitting: Cardiology

## 2014-12-20 ENCOUNTER — Encounter: Payer: Self-pay | Admitting: Cardiology

## 2014-12-20 VITALS — BP 130/82 | HR 74 | Ht 64.0 in | Wt 119.7 lb

## 2014-12-20 DIAGNOSIS — I350 Nonrheumatic aortic (valve) stenosis: Secondary | ICD-10-CM

## 2014-12-20 NOTE — Patient Instructions (Signed)
Your physician wants you to follow-up in: 1 Year. You will receive a reminder letter in the mail two months in advance. If you don't receive a letter, please call our office to schedule the follow-up appointment.  

## 2014-12-20 NOTE — Progress Notes (Signed)
HPI The patient returns for one year followup. Since I last saw her she has done well.  She has been diagnosed with GIST.  She also has a small 4.1 cm thoracic aneurysm.  There has been no new shortness of breath, PND or orthopnea. There have been no reported palpitations, presyncope or syncope.  She is active and still golfs although she hasn't golfed this week. She did have a low blood pressure recently and her Cozaar was stopped but she felt very anxious about this. She says her blood pressure actually has been well controlled at home on the previous dose. I suspect it was low but she's anxious to not be on any of this medication.  Allergies  Allergen Reactions  . Pentothal [Thiopental] Nausea And Vomiting  . Codeine   . Levofloxacin   . Oxycodone-Acetaminophen   . Sulfonamide Derivatives     REACTION: GI upset/nausea/vomiting  . Tape Rash    Per patient adhesive tape    Current Outpatient Prescriptions  Medication Sig Dispense Refill  . amoxicillin (AMOXIL) 500 MG capsule Take 4 capsules by mouth as needed. Before a dental appointment  0  . Ascorbic Acid (VITAMIN C) 500 MG tablet Take 500 mg by mouth daily.      Marland Kitchen aspirin 81 MG tablet Take 81 mg by mouth at bedtime.     . calcium-vitamin D (CALCIUM 500+D) 500-200 MG-UNIT per tablet Take 1 tablet by mouth daily.      . cephALEXin (KEFLEX) 500 MG capsule Take 1 capsule (500 mg total) by mouth 4 (four) times daily. 40 capsule 0  . chlorpheniramine (CHLOR-TRIMETON) 4 MG tablet Take 4 mg by mouth every 4 (four) hours as needed. Sinus/allegries    . Cholecalciferol (VITAMIN D3) 2000 UNITS TABS Take 2,000 Units by mouth daily.      Marland Kitchen denosumab (PROLIA) 60 MG/ML SOLN injection Inject 60 mg into the skin every 6 (six) months. Administer in upper arm, thigh, or abdomen 1 mL 3  . esomeprazole (NEXIUM) 40 MG capsule Take 30- 60 min before your first and last meals of the day 180 capsule 4  . losartan (COZAAR) 50 MG tablet Take 1 tablet (50 mg  total) by mouth every morning. 30 tablet 6  . Lutein-Zeaxanthin 20-1 MG CAPS Take 1 capsule by mouth daily.    . Multiple Vitamins-Minerals (CENTRUM SILVER) tablet Take 1 tablet by mouth daily.      . nitrofurantoin (MACRODANTIN) 100 MG capsule Take 100 mg by mouth at bedtime.    . simvastatin (ZOCOR) 5 MG tablet Take 1 tablet (5 mg total) by mouth at bedtime. 90 tablet 3  . vitamin E 400 UNIT capsule Take 400 Units by mouth every morning.     No current facility-administered medications for this visit.    Past Medical History  Diagnosis Date  . DYSLIPIDEMIA   . HYPERTENSION   . MITRAL VALVE PROLAPSE   . OSTEOARTHRITIS   . OSTEOPOROSIS   . COLONIC POLYPS, HX OF   . Chronic cystitis   . GERD (gastroesophageal reflux disease)   . Macular degeneration     optho q51mo - [facility]  . Meningioma   . NEPHROLITHIASIS   . Anxiety   . Bronchitis   . Diverticulosis   . Hemorrhoids   . Benign fundic gland polyps of stomach   . Hiatal hernia   . Gastritis   . Complication of anesthesia   . PONV (postoperative nausea and vomiting)   . Cancer  recent skin cancer left leg  excised about 8 days ago-remains with dressing intact.   . Benign gastrointestinal stromal tumor (GIST)   . Thoracic aortic aneurysm     4.1 cm    Past Surgical History  Procedure Laterality Date  . Cholecystectomy    . Abdominal hysterectomy    . Appendectomy    . Knee surgery    . Cataract surgery  2000  . Surgery of lip  in 2002  . Breast surgery    . Tonsillectomy    . Ventral hernia repair  07/04/2012    Procedure: HERNIA REPAIR VENTRAL ADULT;  Surgeon: Odis Hollingshead, MD;  Location: [place];  Service: General;  Laterality: N/A;  . Insertion of mesh  07/04/2012    Procedure: INSERTION OF MESH;  Surgeon: Odis Hollingshead, MD;  Location: [facility];  Service: General;  Laterality: N/A;  . Leg skin lesion  biopsy / excision Left     8 days ago pending pathology, remains with dressing.  . Eus N/A 05/16/2014     Procedure: UPPER ENDOSCOPIC ULTRASOUND (EUS) LINEAR;  Surgeon: Milus Banister, MD;  Location: [facility];  Service: Endoscopy;  Laterality: N/A;    ROS:  Lymphadenitis, tired.  Otherwise as stated in the HPI and negative for all other systems.  PHYSICAL EXAM BP 130/82 mmHg  Pulse 74  Ht 5\' 4"  (1.626 m)  Wt 119 lb 11.2 oz (54.296 kg)  BMI 20.54 kg/m2 GENERAL:  Well appearing looks younger than stated age NECK:  No jugular venous distention, waveform within normal limits, carotid upstroke brisk and symmetric, no bruits, no thyromegaly LUNGS:  Clear to auscultation bilaterally BACK:  No CVA tenderness HEART:  PMI not displaced or sustained,S1 and S2 within normal limits, no S3, no S4, no clicks, no rubs, soft systolic murmur at the apex. ABD:  Flat, positive bowel sounds normal in frequency in pitch, no bruits, no rebound, no guarding, no midline pulsatile mass, no hepatomegaly, no splenomegaly EXT:  2 plus pulses throughout, no edema, no cyanosis no clubbing   ASSESSMENT AND PLAN  CHEST PAIN:  She has some chronic atypical pain on the left arm and no change in therapy is indicated.  AS:  This has been very mild on echo in the past. I would not suspect any change based on physical exam and lack of symptoms. No further imaging is warranted.  MVP:  I don't hear any significant regurgitant murmur clicks. No further imaging is planned.  HTN:   She will start taking Cozaar 25 mg she is uncomfortable not being on any but she will keep a blood pressure diary and let me know if it is running too low or too high.  THORACIC ANEURYSM:  This is small.and is going to be followed by Dr. Servando Snare.

## 2014-12-24 NOTE — Telephone Encounter (Signed)
Closed encounter °

## 2014-12-25 ENCOUNTER — Ambulatory Visit (INDEPENDENT_AMBULATORY_CARE_PROVIDER_SITE_OTHER): Payer: Medicare Other | Admitting: Internal Medicine

## 2014-12-25 ENCOUNTER — Other Ambulatory Visit (INDEPENDENT_AMBULATORY_CARE_PROVIDER_SITE_OTHER): Payer: Medicare Other

## 2014-12-25 ENCOUNTER — Encounter: Payer: Self-pay | Admitting: Internal Medicine

## 2014-12-25 VITALS — BP 110/64 | HR 79 | Temp 98.2°F | Resp 18 | Ht 64.0 in | Wt 121.0 lb

## 2014-12-25 DIAGNOSIS — R42 Dizziness and giddiness: Secondary | ICD-10-CM

## 2014-12-25 DIAGNOSIS — N390 Urinary tract infection, site not specified: Secondary | ICD-10-CM

## 2014-12-25 DIAGNOSIS — E871 Hypo-osmolality and hyponatremia: Secondary | ICD-10-CM

## 2014-12-25 DIAGNOSIS — I10 Essential (primary) hypertension: Secondary | ICD-10-CM

## 2014-12-25 LAB — BASIC METABOLIC PANEL
BUN: 15 mg/dL (ref 6–23)
CALCIUM: 9.7 mg/dL (ref 8.4–10.5)
CO2: 32 mEq/L (ref 19–32)
Chloride: 100 mEq/L (ref 96–112)
Creatinine, Ser: 0.55 mg/dL (ref 0.40–1.20)
GFR: 111.42 mL/min (ref >60.00)
GLUCOSE: 158 mg/dL — AB (ref 70–99)
Potassium: 4.2 mEq/L (ref 3.5–5.1)
SODIUM: 134 meq/L — AB (ref 135–145)

## 2014-12-25 NOTE — Assessment & Plan Note (Signed)
stable overall by history and exam, recent data reviewed with pt, and pt to continue medical treatment as before,  to f/u any worsening symptoms or concerns b  BP Readings from Last 3 Encounters:  12/25/14 110/64  12/20/14 130/82  12/18/14 108/74

## 2014-12-25 NOTE — Progress Notes (Signed)
Subjective:    Patient ID: Chelsea Jordan, female    DOB: March 19, 1927, 81 y.o.   MRN: [id number]  HPI  Here to f/u, did stop her losartan apr 13 -15, no real  Change in lightheadedness, then re-started at 25 mg per cardiology, and not worse, just not better.  No fever and Denies urinary symptoms such as dysuria, frequency, urgency, flank pain, hematuria or n/v, fever, chills.  Still has 2 days left for antibx for ? UTI, but asympt, no fever, asks to stop.  Denies worsening depressive symptoms, suicidal ideation, or panic.  Pt denies new neurological symptoms such as new headache, or facial or extremity weakness or numbness   Pt denies polydipsia, polyuria.  Does mention some decreased diet intake recent with lower appetite but trying to do better, wt stable Wt Readings from Last 3 Encounters:  12/25/14 121 lb (54.885 kg)  12/20/14 119 lb 11.2 oz (54.296 kg)  12/18/14 120 lb 0.6 oz (54.45 kg)   Past Medical History  Diagnosis Date  . DYSLIPIDEMIA   . HYPERTENSION   . MITRAL VALVE PROLAPSE   . OSTEOARTHRITIS   . OSTEOPOROSIS   . COLONIC POLYPS, HX OF   . Chronic cystitis   . GERD (gastroesophageal reflux disease)   . Macular degeneration     optho q76mo - [facility]  . Meningioma   . NEPHROLITHIASIS   . Anxiety   . Bronchitis   . Diverticulosis   . Hemorrhoids   . Benign fundic gland polyps of stomach   . Hiatal hernia   . Gastritis   . Complication of anesthesia   . PONV (postoperative nausea and vomiting)   . Cancer     recent skin cancer left leg  excised about 8 days ago-remains with dressing intact.   . Benign gastrointestinal stromal tumor (GIST)   . Thoracic aortic aneurysm     4.1 cm   Past Surgical History  Procedure Laterality Date  . Cholecystectomy    . Abdominal hysterectomy    . Appendectomy    . Knee surgery    . Cataract surgery  2000  . Surgery of lip  in 2002  . Breast surgery    . Tonsillectomy    . Ventral hernia repair  07/04/2012    Procedure: HERNIA  REPAIR VENTRAL ADULT;  Surgeon: Odis Hollingshead, MD;  Location: [place];  Service: General;  Laterality: N/A;  . Insertion of mesh  07/04/2012    Procedure: INSERTION OF MESH;  Surgeon: Odis Hollingshead, MD;  Location: [place];  Service: General;  Laterality: N/A;  . Leg skin lesion  biopsy / excision Left     8 days ago pending pathology, remains with dressing.  . Eus N/A 05/16/2014    Procedure: UPPER ENDOSCOPIC ULTRASOUND (EUS) LINEAR;  Surgeon: Milus Banister, MD;  Location: [facility];  Service: Endoscopy;  Laterality: N/A;    reports that she has never smoked. She has never used smokeless tobacco. She reports that she drinks alcohol. She reports that she does not use illicit drugs. family history includes Breast cancer in her sister; Emphysema in her father; Heart disease in her father, mother, and other; Lung cancer in her sister. Allergies  Allergen Reactions  . Pentothal [Thiopental] Nausea And Vomiting  . Codeine   . Levofloxacin   . Oxycodone-Acetaminophen   . Sulfonamide Derivatives     REACTION: GI upset/nausea/vomiting  . Tape Rash    Per patient adhesive tape   Current  Outpatient Prescriptions on File Prior to Visit  Medication Sig Dispense Refill  . amoxicillin (AMOXIL) 500 MG capsule Take 4 capsules by mouth as needed. Before a dental appointment  0  . Ascorbic Acid (VITAMIN C) 500 MG tablet Take 500 mg by mouth daily.      Marland Kitchen aspirin 81 MG tablet Take 81 mg by mouth at bedtime.     . calcium-vitamin D (CALCIUM 500+D) 500-200 MG-UNIT per tablet Take 1 tablet by mouth daily.      . cephALEXin (KEFLEX) 500 MG capsule Take 1 capsule (500 mg total) by mouth 4 (four) times daily. 40 capsule 0  . chlorpheniramine (CHLOR-TRIMETON) 4 MG tablet Take 4 mg by mouth every 4 (four) hours as needed. Sinus/allegries    . Cholecalciferol (VITAMIN D3) 2000 UNITS TABS Take 2,000 Units by mouth daily.      Marland Kitchen denosumab (PROLIA) 60 MG/ML SOLN injection Inject 60 mg into the skin every 6  (six) months. Administer in upper arm, thigh, or abdomen 1 mL 3  . esomeprazole (NEXIUM) 40 MG capsule Take 30- 60 min before your first and last meals of the day 180 capsule 4  . losartan (COZAAR) 50 MG tablet Take 1 tablet (50 mg total) by mouth every morning. 30 tablet 6  . Lutein-Zeaxanthin 20-1 MG CAPS Take 1 capsule by mouth daily.    . Multiple Vitamins-Minerals (CENTRUM SILVER) tablet Take 1 tablet by mouth daily.      . nitrofurantoin (MACRODANTIN) 100 MG capsule Take 100 mg by mouth at bedtime.    . simvastatin (ZOCOR) 5 MG tablet Take 1 tablet (5 mg total) by mouth at bedtime. 90 tablet 3  . vitamin E 400 UNIT capsule Take 400 Units by mouth every morning.     No current facility-administered medications on file prior to visit.    Review of Systems  Constitutional: Negative for unusual diaphoresis or night sweats HENT: Negative for ringing in ear or discharge Eyes: Negative for double vision or worsening visual disturbance.  Respiratory: Negative for choking and stridor.   Gastrointestinal: Negative for vomiting or other signifcant bowel change Genitourinary: Negative for hematuria or change in urine volume.  Musculoskeletal: Negative for other MSK pain or swelling Skin: Negative for color change and worsening wound.  Neurological: Negative for tremors and numbness other than noted  Psychiatric/Behavioral: Negative for decreased concentration or agitation other than above       Objective:   Physical Exam BP 110/64 mmHg  Pulse 79  Temp(Src) 98.2 F (36.8 C) (Oral)  Resp 18  Ht 5\' 4"  (1.626 m)  Wt 121 lb (54.885 kg)  BMI 20.76 kg/m2  SpO2 96% VS noted,  Constitutional: Pt appears in no significant distress HENT: Head: NCAT.  Right Ear: External ear normal.  Left Ear: External ear normal.  Eyes: . Pupils are equal, round, and reactive to light. Conjunctivae and EOM are normal Neck: Normal range of motion. Neck supple.  Cardiovascular: Normal rate and regular rhythm.    Pulmonary/Chest: Effort normal and breath sounds without rales or wheezing.  Abd:  Soft, NT, ND, + BS Neurological: Pt is alert. Not confused , motor grossly intact Skin: Skin is warm. No rash, no LE edema Psychiatric: Pt behavior is normal. No agitation.  Lab Results  Component Value Date   WBC 7.4 12/18/2014   HGB 14.3 12/18/2014   HCT 42.0 12/18/2014   PLT 208.0 12/18/2014   GLUCOSE 101* 12/18/2014   CHOL 212* 07/12/2014   TRIG 69.0  07/12/2014   HDL 96.20 07/12/2014   LDLDIRECT 97.7 06/22/2013   LDLCALC 102* 07/12/2014   ALT 11 12/18/2014   AST 14 12/18/2014   NA 131* 12/18/2014   K 4.4 12/18/2014   CL 96 12/18/2014   CREATININE 0.55 12/18/2014   BUN 12 12/18/2014   CO2 28 12/18/2014   TSH 2.02 07/12/2014   INR 0.94 06/28/2012       Assessment & Plan:

## 2014-12-25 NOTE — Assessment & Plan Note (Signed)
Ok to d/c current antibx,  to f/u any worsening symptoms or concerns

## 2014-12-25 NOTE — Assessment & Plan Note (Signed)
Etiology unclear, no real change with BP med changes per pt, eval o/w neg, consider neuro referral, pt declines for now

## 2014-12-25 NOTE — Patient Instructions (Signed)
Please continue all other medications as before, and refills have been done if requested.  Please have the pharmacy call with any other refills you may need.  Please continue your efforts at being more active, low cholesterol diet, and weight control.  You are otherwise up to date with prevention measures today.  Please keep your appointments with your specialists as you may have planned  Please go to the LAB in the Basement (turn left off the elevator) for the tests to be done today  You will be contacted by phone if any changes need to be made immediately.  Otherwise, you will receive a letter about your results with an explanation, but please check with MyChart first.

## 2014-12-25 NOTE — Assessment & Plan Note (Signed)
For re-check sodium, may be dietary with some decr po intake recently

## 2014-12-25 NOTE — Progress Notes (Signed)
Pre visit review using our clinic review tool, if applicable. No additional management support is needed unless otherwise documented below in the visit note. 

## 2014-12-26 ENCOUNTER — Telehealth: Payer: Self-pay | Admitting: Oncology

## 2014-12-26 NOTE — Telephone Encounter (Signed)
Being playing phone tag with pt, left msg for pt confirming labs/ov where pt r/s back March due to she would be out of town and would c/b to r/s...., mailed schedule to pt.... KJ

## 2015-01-03 ENCOUNTER — Ambulatory Visit: Payer: Medicare Other | Admitting: Cardiology

## 2015-01-07 ENCOUNTER — Telehealth: Payer: Self-pay | Admitting: Internal Medicine

## 2015-01-07 DIAGNOSIS — R42 Dizziness and giddiness: Secondary | ICD-10-CM

## 2015-01-07 NOTE — Telephone Encounter (Signed)
Referral done

## 2015-01-07 NOTE — Telephone Encounter (Signed)
Patient states she has been light headed.  States Dr. Jenny Reichmann stated that if it did not get better he would refer her to neurology.  Patient is requesting referral to be entered.

## 2015-01-08 NOTE — Telephone Encounter (Signed)
Informed pt .

## 2015-01-14 ENCOUNTER — Ambulatory Visit (HOSPITAL_BASED_OUTPATIENT_CLINIC_OR_DEPARTMENT_OTHER): Payer: Medicare Other | Admitting: Oncology

## 2015-01-14 ENCOUNTER — Telehealth: Payer: Self-pay | Admitting: Oncology

## 2015-01-14 VITALS — BP 150/84 | HR 78 | Temp 97.7°F | Resp 18 | Ht 64.0 in | Wt 119.4 lb

## 2015-01-14 DIAGNOSIS — D481 Neoplasm of uncertain behavior of connective and other soft tissue: Secondary | ICD-10-CM

## 2015-01-14 DIAGNOSIS — Z87440 Personal history of urinary (tract) infections: Secondary | ICD-10-CM

## 2015-01-14 DIAGNOSIS — C7652 Malignant neoplasm of left lower limb: Secondary | ICD-10-CM | POA: Diagnosis not present

## 2015-01-14 DIAGNOSIS — C49A0 Gastrointestinal stromal tumor, unspecified site: Secondary | ICD-10-CM

## 2015-01-14 NOTE — Telephone Encounter (Signed)
Pt confirmed MD visit per 05/10 POF, gave pt AVS and Calendar.... KJ

## 2015-01-14 NOTE — Progress Notes (Signed)
  [facility] OFFICE PROGRESS NOTE   Diagnosis: Gastrointestinal stromal tumor  INTERVAL HISTORY:   Chelsea Jordan returns as scheduled. She reports no dysphagia. She tolerates pills when she takes them with food. She has occasional nausea, but no emesis. She was recently treated for a right submandibular gland infection. She complains of feeling "lightheaded" and has been referred to neurology. She has occasional anterior chest discomfort. She feels this is not related to her heart.  Objective:  Vital signs in last 24 hours:  Blood pressure 150/84, pulse 78, temperature 97.7 F (36.5 C), temperature source Oral, resp. rate 18, height 5\' 4"  (1.626 m), weight 119 lb 6.4 oz (54.159 kg), SpO2 100 %.    HEENT: Neck without mass Lymphatics: No cervical or supra-clavicular nodes Resp: Lungs with fine rales at the left posterior base, no respiratory distress Cardio: Regular rate and rhythm GI: No hepatosplenomegaly, soft nodularity in the abdominal wall near the medial aspect of the right upper quadrant scar Vascular: No leg edema  Medications: I have reviewed the patient's current medications.  Assessment/Plan: 1. 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 History of pill dysphagia secondary to #1  3. Chronic urinary tract infections  4. Squamous cell carcinoma of the left lower leg August 2015   Disposition:  Chelsea Jordan appears stable from an oncology standpoint. She does not appear to have significant symptoms related to the gastrointestinal stromal tumors at present. She will contact us for new symptoms and we will arrange for a repeat endoscopy. [place] therapy will be started as indicated.  Chelsea Jordan will return for an office visit in 6 months.  Betsy Coder, MD  01/14/2015  8:49  AM

## 2015-01-28 ENCOUNTER — Other Ambulatory Visit: Payer: Self-pay | Admitting: *Deleted

## 2015-01-28 DIAGNOSIS — R350 Frequency of micturition: Secondary | ICD-10-CM | POA: Diagnosis not present

## 2015-01-28 DIAGNOSIS — N3020 Other chronic cystitis without hematuria: Secondary | ICD-10-CM | POA: Diagnosis not present

## 2015-01-28 MED ORDER — LOSARTAN POTASSIUM 50 MG PO TABS: 50.0000 mg | ORAL_TABLET | Freq: Every morning | ORAL | Status: DC

## 2015-02-17 DIAGNOSIS — L814 Other melanin hyperpigmentation: Secondary | ICD-10-CM | POA: Diagnosis not present

## 2015-02-17 DIAGNOSIS — Z85828 Personal history of other malignant neoplasm of skin: Secondary | ICD-10-CM | POA: Diagnosis not present

## 2015-02-17 DIAGNOSIS — Z08 Encounter for follow-up examination after completed treatment for malignant neoplasm: Secondary | ICD-10-CM | POA: Diagnosis not present

## 2015-02-21 ENCOUNTER — Encounter: Payer: Self-pay | Admitting: Neurology

## 2015-02-21 ENCOUNTER — Other Ambulatory Visit: Payer: Medicare Other

## 2015-02-21 ENCOUNTER — Ambulatory Visit (INDEPENDENT_AMBULATORY_CARE_PROVIDER_SITE_OTHER): Payer: Medicare Other | Admitting: Neurology

## 2015-02-21 VITALS — BP 140/70 | HR 70 | Resp 18 | Ht 64.0 in | Wt 120.8 lb

## 2015-02-21 DIAGNOSIS — D329 Benign neoplasm of meninges, unspecified: Secondary | ICD-10-CM

## 2015-02-21 DIAGNOSIS — H8101 Meniere's disease, right ear: Secondary | ICD-10-CM | POA: Diagnosis not present

## 2015-02-21 DIAGNOSIS — R42 Dizziness and giddiness: Secondary | ICD-10-CM

## 2015-02-21 DIAGNOSIS — H8191 Unspecified disorder of vestibular function, right ear: Secondary | ICD-10-CM

## 2015-02-21 LAB — CREATININE, SERUM: CREATININE: 0.48 mg/dL — AB (ref 0.50–1.10)

## 2015-02-21 LAB — BUN: BUN: 20 mg/dL (ref 6–23)

## 2015-02-21 NOTE — Progress Notes (Signed)
NEUROLOGY CONSULTATION NOTE  Chelsea Jordan MRN: [id number] DOB: 1926-11-23  Referring provider: Dr. Jenny Reichmann Primary care provider: Dr. Jenny Reichmann  Reason for consult:  Dizziness  HISTORY OF PRESENT ILLNESS: Chelsea Jordan is an 81 year old right-handed woman with hypertension, MVP, dyslipidemia, osteoarthritis, macular degeneration and gastrointestinal stromal tumor who presents for dizziness.  Records, labs and CT of head reviewed.  In April, she began to feel dizzy.  She describes it as a "woozy" feeling where she is being pulled towards the right.  There is no spinning sensation or feeling that she is going to pass out.  Her blood pressure was low (around 108/70s).  She was taking antibiotics for UTI, which was stopped.  Losartan was stopped, which made no difference.  Last week, she fell due to the sensation of pulling to the right.  She denies headache, focal numbness, weakness or slurred speech.  Her most recent imaging of the head was a CT performed on 10/07/14 after a fall, which showed atrophy, chronic small vessel disease and an incidental calcified meningioma in the left frontal region.  Recent labs noted for mild hyponatremia (131-134) but nothing significant.  CBC with diff normal.  PAST MEDICAL HISTORY: Past Medical History  Diagnosis Date  . DYSLIPIDEMIA   . HYPERTENSION   . MITRAL VALVE PROLAPSE   . OSTEOARTHRITIS   . OSTEOPOROSIS   . COLONIC POLYPS, HX OF   . Chronic cystitis   . GERD (gastroesophageal reflux disease)   . Macular degeneration     optho q19mo - [facility]  . Meningioma   . NEPHROLITHIASIS   . Anxiety   . Bronchitis   . Diverticulosis   . Hemorrhoids   . Benign fundic gland polyps of stomach   . Hiatal hernia   . Gastritis   . Complication of anesthesia   . PONV (postoperative nausea and vomiting)   . Cancer     recent skin cancer left leg  excised about 8 days ago-remains with dressing intact.   . Benign gastrointestinal stromal tumor (GIST)   .  Thoracic aortic aneurysm     4.1 cm    PAST SURGICAL HISTORY: Past Surgical History  Procedure Laterality Date  . Cholecystectomy    . Abdominal hysterectomy    . Appendectomy    . Knee surgery    . Cataract surgery  2000  . Surgery of lip  in 2002  . Breast surgery    . Tonsillectomy    . Ventral hernia repair  07/04/2012    Procedure: HERNIA REPAIR VENTRAL ADULT;  Surgeon: Odis Hollingshead, MD;  Location: [facility];  Service: General;  Laterality: N/A;  . Insertion of mesh  07/04/2012    Procedure: INSERTION OF MESH;  Surgeon: Odis Hollingshead, MD;  Location: [facility];  Service: General;  Laterality: N/A;  . Leg skin lesion  biopsy / excision Left     8 days ago pending pathology, remains with dressing.  . Eus N/A 05/16/2014    Procedure: UPPER ENDOSCOPIC ULTRASOUND (EUS) LINEAR;  Surgeon: Milus Banister, MD;  Location: [facility];  Service: Endoscopy;  Laterality: N/A;    MEDICATIONS: Current Outpatient Prescriptions on File Prior to Visit  Medication Sig Dispense Refill  . amoxicillin (AMOXIL) 500 MG capsule Take 500 mg by mouth 3 (three) times daily. 1 hour before dental appt    . Ascorbic Acid (VITAMIN C) 500 MG tablet Take 500 mg by mouth daily.      Marland Kitchen aspirin  81 MG tablet Take 81 mg by mouth at bedtime.     . calcium-vitamin D (CALCIUM 500+D) 500-200 MG-UNIT per tablet Take 1 tablet by mouth daily.      . chlorpheniramine (CHLOR-TRIMETON) 4 MG tablet Take 4 mg by mouth every 4 (four) hours as needed. Sinus/allegries    . Cholecalciferol (VITAMIN D3) 2000 UNITS TABS Take 2,000 Units by mouth daily.      Marland Kitchen esomeprazole (NEXIUM) 40 MG capsule Take 30- 60 min before your first and last meals of the day 180 capsule 4  . losartan (COZAAR) 50 MG tablet Take 1 tablet (50 mg total) by mouth every morning. 90 tablet 2  . Lutein-Zeaxanthin 20-1 MG CAPS Take 1 capsule by mouth daily.    . Multiple Vitamins-Minerals (CENTRUM SILVER) tablet Take 1 tablet by mouth daily.      .  nitrofurantoin (MACRODANTIN) 100 MG capsule Take 100 mg by mouth at bedtime.    . simvastatin (ZOCOR) 5 MG tablet Take 1 tablet (5 mg total) by mouth at bedtime. 90 tablet 3  . vitamin E 400 UNIT capsule Take 400 Units by mouth every morning.    . denosumab (PROLIA) 60 MG/ML SOLN injection Inject 60 mg into the skin every 6 (six) months. Administer in upper arm, thigh, or abdomen (Patient not taking: Reported on 02/21/2015) 1 mL 3   No current facility-administered medications on file prior to visit.    ALLERGIES: Allergies  Allergen Reactions  . Pentothal [Thiopental] Nausea And Vomiting  . Codeine   . Levofloxacin   . Oxycodone-Acetaminophen   . Sulfonamide Derivatives     REACTION: GI upset/nausea/vomiting  . Tape Rash    Per patient adhesive tape    FAMILY HISTORY: Family History  Problem Relation Age of Onset  . Heart disease Mother   . Heart disease Father   . Breast cancer Sister   . Heart disease Other   . Emphysema Father   . Lung cancer Sister   . Cancer Son     prostate  . Cancer Brother     prostate    SOCIAL HISTORY: History   Social History  . Marital Status: Married    Spouse Name: N/A  . Number of Children: 2  . Years of Education: N/A   Occupational History  . retired    Social History Main Topics  . Smoking status: Never Smoker   . Smokeless tobacco: Never Used     Comment: Married, lives with spouse, Enjoys golf. Retired  . Alcohol Use: 0.0 oz/week    0 Standard drinks or equivalent per week     Comment: Wine -nightly  . Drug Use: No  . Sexual Activity: Not Currently    Birth Control/ Protection:    Other Topics Concern  . Not on file   Social History Narrative   Married, lives with spouse - enjoys golf    REVIEW OF SYSTEMS: Constitutional: No fevers, chills, or sweats, no generalized fatigue, change in appetite Eyes: No visual changes, double vision, eye pain Ear, nose and throat: No hearing loss, ear pain, nasal congestion, sore  throat Cardiovascular: No chest pain, palpitations Respiratory:  No shortness of breath at rest or with exertion, wheezes GastrointestinaI: No nausea, vomiting, diarrhea, abdominal pain, fecal incontinence Genitourinary:  No dysuria, urinary retention or frequency Musculoskeletal:  No neck pain, back pain Integumentary: No rash, pruritus, skin lesions Neurological: as above Psychiatric: No depression, insomnia, anxiety Endocrine: No palpitations, fatigue, diaphoresis, mood swings, change in  appetite, change in weight, increased thirst Hematologic/Lymphatic:  No anemia, purpura, petechiae. Allergic/Immunologic: no itchy/runny eyes, nasal congestion, recent allergic reactions, rashes  PHYSICAL EXAM: Filed Vitals:   02/21/15 1446  BP: 140/70  Pulse: 70  Resp: 18   General: No acute distress Head:  Normocephalic/atraumatic Eyes:  fundi unremarkable, without vessel changes, exudates, hemorrhages or papilledema. Neck: supple, no paraspinal tenderness, full range of motion Back: No paraspinal tenderness Heart: regular rate and rhythm Lungs: Clear to auscultation bilaterally. Vascular: No carotid bruits. Neurological Exam: Mental status: alert and oriented to person, place, and time, recent and remote memory intact, fund of knowledge intact, attention and concentration intact, speech fluent and not dysarthric, language intact. Cranial nerves: CN I: not tested CN II: pupils equal, round and reactive to light, visual fields intact, fundi unremarkable, without vessel changes, exudates, hemorrhages or papilledema. CN III, IV, VI:  full range of motion, no nystagmus, no ptosis CN V: facial sensation intact CN VII: upper and lower face symmetric CN VIII: hearing intact CN IX, X: gag intact, uvula midline CN XI: sternocleidomastoid and trapezius muscles intact CN XII: tongue midline Bulk & Tone: normal, no fasciculations. Motor:  5/5 throughout Sensation:  Temperature and vibration  intact Deep Tendon Reflexes:  1+ throughout except absent in ankles.  Toes downgoing Finger to nose testing:  No dysmetria Heel to shin:  On dysmetria Gait:  A little unsteady.  Will stumble a bit to either side.  Unable to tandem walk. Romberg with mild sway.  IMPRESSION: Dizziness Vestibulopathy to the right Meningioma GIST  The dizzy symptoms themselves are vague.  The only symptom which may be neurologic is the sensation of being pulled towards the right, a focal lesion, such as in the right cerebellum, should be evaluated.  PLAN: MRI of brain Will contact patient with results and whether further workup or follow up is needed. In meantime, she should have blood pressure rechecked with Dr. Jenny Reichmann.  Thank you for allowing me to take part in the care of this patient.  Metta Clines, DO  CC:  Cathlean Cower, MD

## 2015-02-21 NOTE — Patient Instructions (Addendum)
Check MRI of brain with and without  [facility] 6/29/9:45am

## 2015-02-28 ENCOUNTER — Telehealth: Payer: Self-pay | Admitting: Neurology

## 2015-02-28 NOTE — Telephone Encounter (Signed)
Patient concerned about her BUN and creatine labs for MRI she was advised that they were ok to continue with imagine but would like to know your opinion

## 2015-02-28 NOTE — Telephone Encounter (Signed)
Labs look great, ok to proceed with imaging.

## 2015-02-28 NOTE — Telephone Encounter (Signed)
Pt had labs drawn this week, says MRI is contingent on the lab results please call patient with lab results. Her MRI is scheduled for Tuesday, is she OK to have this done. CB# [id number]. Pt would like a response today / Sherri S.

## 2015-03-04 ENCOUNTER — Ambulatory Visit (HOSPITAL_COMMUNITY)
Admission: RE | Admit: 2015-03-04 | Discharge: 2015-03-04 | Disposition: A | Discharge status: Home / Self Care | Payer: Medicare Other | Source: Ambulatory Visit | Attending: Neurology | Admitting: Neurology

## 2015-03-04 DIAGNOSIS — I739 Peripheral vascular disease, unspecified: Secondary | ICD-10-CM | POA: Diagnosis not present

## 2015-03-04 DIAGNOSIS — R42 Dizziness and giddiness: Secondary | ICD-10-CM

## 2015-03-04 DIAGNOSIS — H8191 Unspecified disorder of vestibular function, right ear: Secondary | ICD-10-CM

## 2015-03-04 DIAGNOSIS — G319 Degenerative disease of nervous system, unspecified: Secondary | ICD-10-CM | POA: Diagnosis not present

## 2015-03-04 DIAGNOSIS — D329 Benign neoplasm of meninges, unspecified: Secondary | ICD-10-CM | POA: Diagnosis not present

## 2015-03-04 DIAGNOSIS — D320 Benign neoplasm of cerebral meninges: Secondary | ICD-10-CM | POA: Diagnosis not present

## 2015-03-04 MED ORDER — GADOBENATE DIMEGLUMINE 529 MG/ML IV SOLN
15.0000 mL | Freq: Once | INTRAVENOUS | Status: AC | PRN
  Administered 2015-03-04: 11 mL via INTRAVENOUS

## 2015-03-05 ENCOUNTER — Telehealth: Payer: Self-pay | Admitting: *Deleted

## 2015-03-05 ENCOUNTER — Ambulatory Visit (HOSPITAL_COMMUNITY): Payer: Medicare Other

## 2015-03-05 ENCOUNTER — Telehealth: Payer: Self-pay | Admitting: Neurology

## 2015-03-05 NOTE — Telephone Encounter (Signed)
Patient is aware of MRI results

## 2015-03-05 NOTE — Telephone Encounter (Signed)
Patient is aware MRI of brain shows nothing concerning that would be causing her dizziness. Meningioma is again seen but is an incidental finding. No further neurologic workup indicated. Follow up as needed.

## 2015-03-05 NOTE — Telephone Encounter (Signed)
Pt states that she was returning your call Susie please call her at [phone] she thought i might be about MRI

## 2015-03-05 NOTE — Telephone Encounter (Signed)
-----   Message from Pieter Partridge, DO sent at 03/05/2015  6:52 AM EDT ----- MRI of brain shows nothing concerning that would be causing her dizziness.  Meningioma is again seen but is an incidental finding.  No further neurologic workup indicated.  Follow up as needed.

## 2015-03-05 NOTE — Telephone Encounter (Signed)
-----   Message from Pieter Partridge, DO sent at 03/05/2015  6:51 AM EDT ----- MRI shows nothing concerning that would be causing dizziness.  The meningioma is again seen, but is incidental and not related to her symptoms.  No further neurologic workup indicated.  Follow up as needed.

## 2015-03-05 NOTE — Telephone Encounter (Signed)
Left message for patient to return call regarding MRI results.

## 2015-04-17 ENCOUNTER — Emergency Department (HOSPITAL_COMMUNITY): Payer: Medicare Other

## 2015-04-17 ENCOUNTER — Emergency Department (HOSPITAL_COMMUNITY)
Admission: EM | Admit: 2015-04-17 | Discharge: 2015-04-17 | Disposition: A | Discharge status: Home / Self Care | Payer: Medicare Other | Attending: Emergency Medicine | Admitting: Emergency Medicine

## 2015-04-17 ENCOUNTER — Encounter (HOSPITAL_COMMUNITY): Payer: Self-pay

## 2015-04-17 DIAGNOSIS — Y9389 Activity, other specified: Secondary | ICD-10-CM | POA: Diagnosis not present

## 2015-04-17 DIAGNOSIS — W19XXXA Unspecified fall, initial encounter: Secondary | ICD-10-CM

## 2015-04-17 DIAGNOSIS — M1990 Unspecified osteoarthritis, unspecified site: Secondary | ICD-10-CM | POA: Insufficient documentation

## 2015-04-17 DIAGNOSIS — M810 Age-related osteoporosis without current pathological fracture: Secondary | ICD-10-CM | POA: Diagnosis not present

## 2015-04-17 DIAGNOSIS — Z86010 Personal history of colonic polyps: Secondary | ICD-10-CM | POA: Diagnosis not present

## 2015-04-17 DIAGNOSIS — M40209 Unspecified kyphosis, site unspecified: Secondary | ICD-10-CM | POA: Diagnosis not present

## 2015-04-17 DIAGNOSIS — Y9289 Other specified places as the place of occurrence of the external cause: Secondary | ICD-10-CM | POA: Diagnosis not present

## 2015-04-17 DIAGNOSIS — S40011A Contusion of right shoulder, initial encounter: Secondary | ICD-10-CM | POA: Insufficient documentation

## 2015-04-17 DIAGNOSIS — Z85828 Personal history of other malignant neoplasm of skin: Secondary | ICD-10-CM | POA: Insufficient documentation

## 2015-04-17 DIAGNOSIS — Z87442 Personal history of urinary calculi: Secondary | ICD-10-CM | POA: Insufficient documentation

## 2015-04-17 DIAGNOSIS — Z79899 Other long term (current) drug therapy: Secondary | ICD-10-CM | POA: Diagnosis not present

## 2015-04-17 DIAGNOSIS — S0990XA Unspecified injury of head, initial encounter: Secondary | ICD-10-CM | POA: Diagnosis present

## 2015-04-17 DIAGNOSIS — K219 Gastro-esophageal reflux disease without esophagitis: Secondary | ICD-10-CM | POA: Diagnosis not present

## 2015-04-17 DIAGNOSIS — Z792 Long term (current) use of antibiotics: Secondary | ICD-10-CM | POA: Insufficient documentation

## 2015-04-17 DIAGNOSIS — Z8669 Personal history of other diseases of the nervous system and sense organs: Secondary | ICD-10-CM | POA: Insufficient documentation

## 2015-04-17 DIAGNOSIS — E785 Hyperlipidemia, unspecified: Secondary | ICD-10-CM | POA: Diagnosis not present

## 2015-04-17 DIAGNOSIS — S199XXA Unspecified injury of neck, initial encounter: Secondary | ICD-10-CM | POA: Diagnosis not present

## 2015-04-17 DIAGNOSIS — S0083XA Contusion of other part of head, initial encounter: Secondary | ICD-10-CM | POA: Diagnosis not present

## 2015-04-17 DIAGNOSIS — Y998 Other external cause status: Secondary | ICD-10-CM | POA: Diagnosis not present

## 2015-04-17 DIAGNOSIS — Z86018 Personal history of other benign neoplasm: Secondary | ICD-10-CM | POA: Insufficient documentation

## 2015-04-17 DIAGNOSIS — S299XXA Unspecified injury of thorax, initial encounter: Secondary | ICD-10-CM | POA: Diagnosis not present

## 2015-04-17 DIAGNOSIS — Z7982 Long term (current) use of aspirin: Secondary | ICD-10-CM | POA: Insufficient documentation

## 2015-04-17 DIAGNOSIS — I10 Essential (primary) hypertension: Secondary | ICD-10-CM | POA: Insufficient documentation

## 2015-04-17 DIAGNOSIS — W01198A Fall on same level from slipping, tripping and stumbling with subsequent striking against other object, initial encounter: Secondary | ICD-10-CM | POA: Diagnosis not present

## 2015-04-17 DIAGNOSIS — S4991XA Unspecified injury of right shoulder and upper arm, initial encounter: Secondary | ICD-10-CM | POA: Diagnosis not present

## 2015-04-17 NOTE — ED Notes (Signed)
Patient states she fell in the garage, landing on her left side. Patient states she hit her right arm on a car and has a hematoma to the right forehead. patient denies any LOC.

## 2015-04-17 NOTE — ED Provider Notes (Signed)
CSN: [id number]     Arrival date & time 04/17/15  0807 History   First MD Initiated Contact with Patient 04/17/15 [phone]     Chief Complaint  Patient presents with  . Fall  . Head Injury  . Arm Pain     Patient is a 81 y.o. female presenting with fall, head injury, and arm pain. The history is provided by the patient. No language interpreter was used.  Fall  Head Injury Arm Pain   Chelsea Jordan presents for evaluation of injuries following a fall. She was dog sitting her son's dog and tripped in the garage and fell onto her right arm and head. She hit her head on the back of her daughter-in-law's car. She denies any loss of consciousness. She has pain to her head and her right upper arm. She is unable to move the arm fully without pain. She was able to get up on her own and has no difficulty with walking. She takes a daily aspirin, Lasix aspirin once last night. She has a history of thoracic aortic aneurysm. She denies any recent illnesses. Symptoms are moderate and constant.  Past Medical History  Diagnosis Date  . DYSLIPIDEMIA   . HYPERTENSION   . MITRAL VALVE PROLAPSE   . OSTEOARTHRITIS   . OSTEOPOROSIS   . COLONIC POLYPS, HX OF   . Chronic cystitis   . GERD (gastroesophageal reflux disease)   . Macular degeneration     optho q84mo - [facility]  . Meningioma   . NEPHROLITHIASIS   . Anxiety   . Bronchitis   . Diverticulosis   . Hemorrhoids   . Benign fundic gland polyps of stomach   . Hiatal hernia   . Gastritis   . Complication of anesthesia   . PONV (postoperative nausea and vomiting)   . Cancer     recent skin cancer left leg  excised about 8 days ago-remains with dressing intact.   . Benign gastrointestinal stromal tumor (GIST)   . Thoracic aortic aneurysm     4.1 cm   Past Surgical History  Procedure Laterality Date  . Cholecystectomy    . Abdominal hysterectomy    . Appendectomy    . Knee surgery    . Cataract surgery  2000  . Surgery of lip  in 2002  . Breast  surgery    . Tonsillectomy    . Ventral hernia repair  07/04/2012    Procedure: HERNIA REPAIR VENTRAL ADULT;  Surgeon: Odis Hollingshead, MD;  Location: [facility];  Service: General;  Laterality: N/A;  . Insertion of mesh  07/04/2012    Procedure: INSERTION OF MESH;  Surgeon: Odis Hollingshead, MD;  Location: [facility];  Service: General;  Laterality: N/A;  . Leg skin lesion  biopsy / excision Left     8 days ago pending pathology, remains with dressing.  . Eus N/A 05/16/2014    Procedure: UPPER ENDOSCOPIC ULTRASOUND (EUS) LINEAR;  Surgeon: Milus Banister, MD;  Location: [facility];  Service: Endoscopy;  Laterality: N/A;  . Esophagogastroduodenoscopy    . Colonoscopy     Family History  Problem Relation Age of Onset  . Heart disease Mother   . Heart disease Father   . Breast cancer Sister   . Heart disease Other   . Emphysema Father   . Lung cancer Sister   . Prostate cancer Son   . Prostate cancer Brother    Social History  Substance Use Topics  . Smoking  status: Never Smoker   . Smokeless tobacco: Never Used     Comment: Married, lives with spouse, Enjoys golf. Retired  . Alcohol Use: 0.0 oz/week    0 Standard drinks or equivalent per week     Comment: Wine -nightly   OB History    No data available     Review of Systems  All other systems reviewed and are negative.     Allergies  Pentothal; Codeine; Levofloxacin; Oxycodone-acetaminophen; Sulfonamide derivatives; and Tape  Home Medications   Prior to Admission medications   Medication Sig Start Date End Date Taking? Authorizing Provider  amoxicillin (AMOXIL) 500 MG capsule Take 500 mg by mouth 3 (three) times daily. 1 hour before dental appt    Historical Provider, MD  Ascorbic Acid (VITAMIN C) 500 MG tablet Take 500 mg by mouth daily.      Historical Provider, MD  aspirin 81 MG tablet Take 81 mg by mouth at bedtime.     Historical Provider, MD  calcium-vitamin D (CALCIUM 500+D) 500-200 MG-UNIT per tablet Take 1  tablet by mouth daily.      Historical Provider, MD  chlorpheniramine (CHLOR-TRIMETON) 4 MG tablet Take 4 mg by mouth every 4 (four) hours as needed. Sinus/allegries    Historical Provider, MD  Cholecalciferol (VITAMIN D3) 2000 UNITS TABS Take 2,000 Units by mouth daily.      Historical Provider, MD  denosumab (PROLIA) 60 MG/ML SOLN injection Inject 60 mg into the skin every 6 (six) months. Administer in upper arm, thigh, or abdomen Patient not taking: Reported on 02/21/2015 06/22/13   Rowe Clack, MD  esomeprazole (NEXIUM) 40 MG capsule Take 30- 60 min before your first and last meals of the day 01/16/14   Melvenia Needles, NP  losartan (COZAAR) 50 MG tablet Take 1 tablet (50 mg total) by mouth every morning. 01/28/15   Minus Breeding, MD  Lutein-Zeaxanthin 20-1 MG CAPS Take 1 capsule by mouth daily.    Historical Provider, MD  Multiple Vitamins-Minerals (CENTRUM SILVER) tablet Take 1 tablet by mouth daily.      Historical Provider, MD  nitrofurantoin (MACRODANTIN) 100 MG capsule Take 100 mg by mouth at bedtime.    Historical Provider, MD  simvastatin (ZOCOR) 5 MG tablet Take 1 tablet (5 mg total) by mouth at bedtime. 07/15/14   Rowe Clack, MD  vitamin E 400 UNIT capsule Take 400 Units by mouth every morning.    Historical Provider, MD   BP 173/96 mmHg  Pulse 73  Temp(Src) 97.7 F (36.5 C) (Oral)  Resp 16  Ht 5\' 4"  (1.626 m)  Wt 119 lb (53.978 kg)  BMI 20.42 kg/m2  SpO2 100% Physical Exam  Constitutional: She is oriented to person, place, and time. She appears well-developed and well-nourished.  HENT:  Head: Normocephalic.  Swelling and ecchymosis to the right temporal region.  Eyes: EOM are normal. Pupils are equal, round, and reactive to light.  Neck:  No C, T, L-spine tenderness  Cardiovascular: Normal rate and regular rhythm.   No murmur heard. Pulmonary/Chest: Effort normal and breath sounds normal. No respiratory distress.  Mild kyphosis  Abdominal: Soft. There is  no tenderness. There is no rebound and no guarding.  Musculoskeletal:  There is no tenderness to palpation over the right upper extremity, but patient has difficulty with crossing the midline and has pain with abduction and range of motion of the right shoulder. There is a slight fullness over the right humeral head when compared to  the left. 2+ radial pulses bilaterally. 5 out of 5 grip strength bilaterally.  Neurological: She is alert and oriented to person, place, and time.  Skin: Skin is warm and dry.  Psychiatric: She has a normal mood and affect. Her behavior is normal.  Nursing note and vitals reviewed.   ED Course  Procedures (including critical care time) Labs Review Labs Reviewed - No data to display  Imaging Review Dg Chest 2 View  04/17/2015   CLINICAL DATA:  Fall.  EXAM: CHEST  2 VIEW  COMPARISON:  March 12, 2014.  FINDINGS: The heart size and mediastinal contours are within normal limits. Both lungs are clear. No pneumothorax or pleural effusion is noted. The visualized skeletal structures are unremarkable.  IMPRESSION: No active cardiopulmonary disease.   Electronically Signed   By: Marijo Conception, M.D.   On: 04/17/2015 09:03   Dg Shoulder Right  04/17/2015   CLINICAL DATA:  Fall.  Initial evaluation.  EXAM: RIGHT SHOULDER - 2+ VIEW  COMPARISON:  None.  FINDINGS: Diffuse osteopenia. No acute bony or joint abnormality. No evidence of fracture or dislocation.  IMPRESSION: No acute abnormality.  Diffuse osteopenia.   Electronically Signed   By: Marcello Moores  Register   On: 04/17/2015 09:01   Ct Head Wo Contrast  04/17/2015   CLINICAL DATA:  Right forehead hematoma after falling in garage. No loss of consciousness.  EXAM: CT HEAD WITHOUT CONTRAST  CT CERVICAL SPINE WITHOUT CONTRAST  TECHNIQUE: Multidetector CT imaging of the head and cervical spine was performed following the standard protocol without intravenous contrast. Multiplanar CT image reconstructions of the cervical spine were also  generated.  COMPARISON:  CT scan of head of October 07, 2014.  FINDINGS: CT HEAD FINDINGS  Bony calvarium appears intact. Right frontal scalp hematoma is noted. Stable calcified meningioma is seen in the left frontal region. Mild diffuse cortical atrophy is noted. Mild chronic ischemic white matter disease is noted. No mass effect or midline shift is noted. Ventricular size is within normal limits. There is no evidence of mass lesion, hemorrhage or acute infarction.  CT CERVICAL SPINE FINDINGS  No fracture or spondylolisthesis is noted. Severe degenerative disc disease is noted at C2-3, C3-4 and C4-5. Moderate degenerative disc disease is noted at C5-6 and C6-7. Posterior facet joints appear intact. Visualized portions of upper lung fields appear normal.  IMPRESSION: Right frontal scalp hematoma. Stable calcified meningioma seen in left frontal region. Mild diffuse cortical atrophy. Mild chronic ischemic white matter disease. No acute intracranial abnormality seen.  Moderate to severe multilevel degenerative disc disease. No acute abnormality seen in the cervical spine.   Electronically Signed   By: Marijo Conception, M.D.   On: 04/17/2015 08:59   Ct Cervical Spine Wo Contrast  04/17/2015   CLINICAL DATA:  Right forehead hematoma after falling in garage. No loss of consciousness.  EXAM: CT HEAD WITHOUT CONTRAST  CT CERVICAL SPINE WITHOUT CONTRAST  TECHNIQUE: Multidetector CT imaging of the head and cervical spine was performed following the standard protocol without intravenous contrast. Multiplanar CT image reconstructions of the cervical spine were also generated.  COMPARISON:  CT scan of head of October 07, 2014.  FINDINGS: CT HEAD FINDINGS  Bony calvarium appears intact. Right frontal scalp hematoma is noted. Stable calcified meningioma is seen in the left frontal region. Mild diffuse cortical atrophy is noted. Mild chronic ischemic white matter disease is noted. No mass effect or midline shift is noted.  Ventricular size is within normal  limits. There is no evidence of mass lesion, hemorrhage or acute infarction.  CT CERVICAL SPINE FINDINGS  No fracture or spondylolisthesis is noted. Severe degenerative disc disease is noted at C2-3, C3-4 and C4-5. Moderate degenerative disc disease is noted at C5-6 and C6-7. Posterior facet joints appear intact. Visualized portions of upper lung fields appear normal.  IMPRESSION: Right frontal scalp hematoma. Stable calcified meningioma seen in left frontal region. Mild diffuse cortical atrophy. Mild chronic ischemic white matter disease. No acute intracranial abnormality seen.  Moderate to severe multilevel degenerative disc disease. No acute abnormality seen in the cervical spine.   Electronically Signed   By: Marijo Conception, M.D.   On: 04/17/2015 08:59     EKG Interpretation None      MDM   Final diagnoses:  Fall, initial encounter  Facial contusion, initial encounter  Shoulder contusion, right, initial encounter    Patient here for evaluation of injuries following a mechanical fall. Patient has pain with range of motion of the right shoulder without any obvious fracture or dislocation on imaging. Discussed with patient home care for shoulder contusion as well as a contusion with orthopedics follow-up and return precautions. Discussed nonweightbearing to the right upper extremity while she is healing.  Quintella Reichert, MD 04/17/15 1000

## 2015-04-17 NOTE — ED Notes (Signed)
Patient transported to CT 

## 2015-04-17 NOTE — ED Notes (Signed)
Provided pt with Ice Pack

## 2015-04-17 NOTE — ED Notes (Signed)
Patient ambulated to X-ray/CT with RAD tech

## 2015-04-17 NOTE — Discharge Instructions (Signed)
Facial or Scalp Contusion A facial or scalp contusion is a deep bruise on the face or head. Injuries to the face and head generally cause a lot of swelling, especially around the eyes. Contusions are the result of an injury that caused bleeding under the skin. The contusion may turn blue, purple, or yellow. Minor injuries will give you a painless contusion, but more severe contusions may stay painful and swollen for a few weeks.  CAUSES  A facial or scalp contusion is caused by a blunt injury or trauma to the face or head area.  SIGNS AND SYMPTOMS   Swelling of the injured area.   Discoloration of the injured area.   Tenderness, soreness, or pain in the injured area.  DIAGNOSIS  The diagnosis can be made by taking a medical history and doing a physical exam. An X-ray exam, CT scan, or MRI may be needed to determine if there are any associated injuries, such as broken bones (fractures). TREATMENT  Often, the best treatment for a facial or scalp contusion is applying cold compresses to the injured area. Over-the-counter medicines may also be recommended for pain control.  HOME CARE INSTRUCTIONS   Only take over-the-counter or prescription medicines as directed by your health care provider.   Apply ice to the injured area.   Put ice in a plastic bag.   Place a towel between your skin and the bag.   Leave the ice on for 20 minutes, 2-3 times a day.  SEEK MEDICAL CARE IF:  You have bite problems.   You have pain with chewing.   You are concerned about facial defects. SEEK IMMEDIATE MEDICAL CARE IF:  You have severe pain or a headache that is not relieved by medicine.   You have unusual sleepiness, confusion, or personality changes.   You throw up (vomit).   You have a persistent nosebleed.   You have double vision or blurred vision.   You have fluid drainage from your nose or ear.   You have difficulty walking or using your arms or legs.  MAKE SURE YOU:    Understand these instructions.  Will watch your condition. Will get help right away if you are not doing well or get worse.  Shoulder Sprain A shoulder sprain is the result of damage to the tough, fiber-like tissues (ligaments) that help hold your shoulder in place. The ligaments may be stretched or torn. Besides the main shoulder joint (the ball and socket), there are several smaller joints that connect the bones in this area. A sprain usually involves one of those joints. Most often it is the acromioclavicular (or AC) joint. That is the joint that connects the collarbone (clavicle) and the shoulder blade (scapula) at the top point of the shoulder blade (acromion). A shoulder sprain is a mild form of what is called a shoulder separation. Recovering from a shoulder sprain may take some time. For some, pain lingers for several months. Most people recover without long term problems. CAUSES   A shoulder sprain is usually caused by some kind of trauma. This might be:  Falling on an outstretched arm.  Being hit hard on the shoulder.  Twisting the arm.  Shoulder sprains are more likely to occur in people who:  Play sports.  Have balance or coordination problems. SYMPTOMS   Pain when you move your shoulder.  Limited ability to move the shoulder.  Swelling and tenderness on top of the shoulder.  Redness or warmth in the shoulder.  Bruising.  A change in the shape of the shoulder. DIAGNOSIS  Your healthcare provider may:  Ask about your symptoms.  Ask about recent activity that might have caused those symptoms.  Examine your shoulder. You may be asked to do simple exercises to test movement. The other shoulder will be examined for comparison.  Order some tests that provide a look inside the body. They can show the extent of the injury. The tests could include:  X-rays.  CT (computed tomography) scan.  MRI (magnetic resonance imaging) scan. RISKS AND  COMPLICATIONS  Loss of full shoulder motion.  Ongoing shoulder pain. TREATMENT  How long it takes to recover from a shoulder sprain depends on how severe it was. Treatment options may include:  Rest. You should not use the arm or shoulder until it heals.  Ice. For 2 or 3 days after the injury, put an ice pack on the shoulder up to 4 times a day. It should stay on for 15 to 20 minutes each time. Wrap the ice in a towel so it does not touch your skin.  Over-the-counter medicine to relieve pain.  A sling or brace. This will keep the arm still while the shoulder is healing.  Physical therapy or rehabilitation exercises. These will help you regain strength and motion. Ask your healthcare provider when it is OK to begin these exercises.  Surgery. The need for surgery is rare with a sprained shoulder, but some people may need surgery to keep the joint in place and reduce pain. HOME CARE INSTRUCTIONS   Ask your healthcare provider about what you should and should not do while your shoulder heals.  Make sure you know how to apply ice to the correct area of your shoulder.  Talk with your healthcare provider about which medications should be used for pain and swelling.  If rehabilitation therapy will be needed, ask your healthcare provider to refer you to a therapist. If it is not recommended, then ask about at-home exercises. Find out when exercise should begin. SEEK MEDICAL CARE IF:  Your pain, swelling, or redness at the joint increases. SEEK IMMEDIATE MEDICAL CARE IF:   You have a fever.  You cannot move your arm or shoulder. Document Released: 01/09/2009 Document Revised: 11/15/2011 Document Reviewed: 01/09/2009 [facility] Patient Information 2015 [place], [place]. This information is not intended to replace advice given to you by your health care provider. Make sure you discuss any questions you have with your health care provider.

## 2015-04-21 ENCOUNTER — Ambulatory Visit (INDEPENDENT_AMBULATORY_CARE_PROVIDER_SITE_OTHER): Payer: Medicare Other | Admitting: Internal Medicine

## 2015-04-21 ENCOUNTER — Encounter: Payer: Self-pay | Admitting: Internal Medicine

## 2015-04-21 VITALS — BP 124/80 | HR 80 | Ht 63.5 in | Wt 120.5 lb

## 2015-04-21 DIAGNOSIS — C49A0 Gastrointestinal stromal tumor, unspecified site: Secondary | ICD-10-CM

## 2015-04-21 DIAGNOSIS — D481 Neoplasm of uncertain behavior of connective and other soft tissue: Secondary | ICD-10-CM

## 2015-04-21 NOTE — Progress Notes (Signed)
   Subjective:    Patient ID: Chelsea Jordan, female    DOB: 1927-06-03, 81 y.o.   MRN: [id number] Cc: f/u GIST of esophagus HPI Pleasant elderly woman with GISt of esophagus here for f/u. Says Dr. Benay Spice advised she return.  No significant dysphagia. Still playing golf up until a recent fall.  Medications, allergies, past medical history, past surgical history, family history and social history are reviewed and updated in the EMR.  Review of Systems Recent fall and right periorbital/facial trauma    Objective:   Physical Exam BP 124/80 mmHg  Pulse 80  Ht 5' 3.5" (1.613 m)  Wt 120 lb 8 oz (54.658 kg)  BMI 21.01 kg/m2 Right periorbital hematoma     Assessment & Plan:  Gastrointestinal stromal tumor (GIST) - esophagus  Seems to be doing well - ChemoTx being held for sxs. I do not see any reason to do an endoscopy in absennce of new or worsening sxs. Will double check w/ Dr. Benay Spice

## 2015-04-21 NOTE — Assessment & Plan Note (Signed)
Not having sxs Expectant f/u Will clarify w/ Dr. Benay Spice re endposcopy

## 2015-04-21 NOTE — Patient Instructions (Signed)
Call us if you have any other swallowing problems.   Dr. Carlean Purl is going to discuss your case with Dr. Benay Spice.   I appreciate the opportunity to care for you. Silvano Rusk, MD, [facility]

## 2015-04-22 DIAGNOSIS — S4991XA Unspecified injury of right shoulder and upper arm, initial encounter: Secondary | ICD-10-CM | POA: Diagnosis not present

## 2015-06-03 DIAGNOSIS — Z23 Encounter for immunization: Secondary | ICD-10-CM | POA: Diagnosis not present

## 2015-07-03 ENCOUNTER — Telehealth: Payer: Self-pay | Admitting: Internal Medicine

## 2015-07-03 NOTE — Telephone Encounter (Signed)
States she is missing some pages of the form that was recently faxed over.  She is going to fax the form back noting the pages she did not receive.

## 2015-07-09 NOTE — Telephone Encounter (Signed)
This was faxed on Monday.

## 2015-07-10 ENCOUNTER — Other Ambulatory Visit: Payer: Self-pay | Admitting: Internal Medicine

## 2015-07-14 ENCOUNTER — Encounter: Payer: Self-pay | Admitting: Family

## 2015-07-14 ENCOUNTER — Telehealth: Payer: Self-pay | Admitting: Family

## 2015-07-14 ENCOUNTER — Ambulatory Visit (INDEPENDENT_AMBULATORY_CARE_PROVIDER_SITE_OTHER): Payer: Medicare Other | Admitting: Family

## 2015-07-14 ENCOUNTER — Other Ambulatory Visit (INDEPENDENT_AMBULATORY_CARE_PROVIDER_SITE_OTHER): Payer: Medicare Other

## 2015-07-14 ENCOUNTER — Telehealth: Payer: Self-pay | Admitting: Internal Medicine

## 2015-07-14 VITALS — BP 138/92 | HR 67 | Temp 97.9°F | Resp 18 | Ht 63.5 in | Wt 122.0 lb

## 2015-07-14 DIAGNOSIS — E785 Hyperlipidemia, unspecified: Secondary | ICD-10-CM

## 2015-07-14 DIAGNOSIS — Z0000 Encounter for general adult medical examination without abnormal findings: Secondary | ICD-10-CM

## 2015-07-14 LAB — COMPREHENSIVE METABOLIC PANEL
ALT: 13 U/L (ref 0–35)
AST: 18 U/L (ref 0–37)
Albumin: 4.1 g/dL (ref 3.5–5.2)
Alkaline Phosphatase: 94 U/L (ref 39–117)
BUN: 12 mg/dL (ref 6–23)
CO2: 29 meq/L (ref 19–32)
CREATININE: 0.5 mg/dL (ref 0.40–1.20)
Calcium: 10 mg/dL (ref 8.4–10.5)
Chloride: 101 mEq/L (ref 96–112)
GFR: 124.21 mL/min (ref >60.00)
GLUCOSE: 102 mg/dL — AB (ref 70–99)
Potassium: 3.9 mEq/L (ref 3.5–5.1)
Sodium: 139 mEq/L (ref 135–145)
Total Bilirubin: 0.8 mg/dL (ref 0.2–1.2)
Total Protein: 6.5 g/dL (ref 6.0–8.3)

## 2015-07-14 LAB — LIPID PANEL
CHOL/HDL RATIO: 2
CHOLESTEROL: 200 mg/dL (ref 0–200)
HDL: 89.8 mg/dL (ref >39.00)
LDL Cholesterol: 89 mg/dL (ref 0–99)
NonHDL: 110.45
Triglycerides: 109 mg/dL (ref 0.0–149.0)
VLDL: 21.8 mg/dL (ref 0.0–40.0)

## 2015-07-14 LAB — TSH: TSH: 2.27 u[IU]/mL (ref 0.35–4.50)

## 2015-07-14 NOTE — Assessment & Plan Note (Signed)
Reviewed and updated patient's medical, surgical, family and social history. Medications and allergies were also reviewed. Basic screenings for depression, activities of daily living, hearing, cognition and safety were performed. Provider list was updated and health plan was provided to the patient.   1) Anticipatory Guidance: Discussed importance of wearing a seatbelt while driving and not texting while driving; changing batteries in smoke detector at least once annually; wearing suntan lotion when outside; eating a balanced and moderate diet; getting physical activity at least 30 minutes per day.  2) Immunizations / Screenings / Labs:  I'll immunizations are up-to-date per recommendations. All screenings are up-to-date per recommendations. Obtain CMET, Lipid profile and TSH.   Overall well exam. Discussed risk factors for cardiovascular disease and chronic conditions including hypertension and hyperlipidemia. Appears to be well controlled with medication regimen although blood pressure slightly elevated today. Denies adverse side effects of medications. Encouraged increasing physical activity to 30 minutes most days of the week as tolerated including adding aquatic therapy an generalized walking. Continue other healthy lifestyle behaviors and choices. Follow-up prevention exam in one year. Follow-up office visits as needed.

## 2015-07-14 NOTE — Patient Instructions (Signed)
Thank you for choosing [facility].  Summary/Instructions:  Please stop by the lab on the basement level of the building for your blood work. Your results will be released to [facility] (or called to you) after review, usually within 72 hours after test completion. If any changes need to be made, you will be notified at that same time.  Health Maintenance, Female Adopting a healthy lifestyle and getting preventive care can go a long way to promote health and wellness. Talk with your health care provider about what schedule of regular examinations is right for you. This is a good chance for you to check in with your provider about disease prevention and staying healthy. In between checkups, there are plenty of things you can do on your own. Experts have done a lot of research about which lifestyle changes and preventive measures are most likely to keep you healthy. Ask your health care provider for more information. WEIGHT AND DIET  Eat a healthy diet  Be sure to include plenty of vegetables, fruits, low-fat dairy products, and lean protein.  Do not eat a lot of foods high in solid fats, added sugars, or salt.  Get regular exercise. This is one of the most important things you can do for your health.  Most adults should exercise for at least 150 minutes each week. The exercise should increase your heart rate and make you sweat (moderate-intensity exercise).  Most adults should also do strengthening exercises at least twice a week. This is in addition to the moderate-intensity exercise.  Maintain a healthy weight  Body mass index (BMI) is a measurement that can be used to identify possible weight problems. It estimates body fat based on height and weight. Your health care provider can help determine your BMI and help you achieve or maintain a healthy weight.  For females 66 years of age and older:   A BMI below 18.5 is considered underweight.  A BMI of 18.5 to 24.9 is normal.  A  BMI of 25 to 29.9 is considered overweight.  A BMI of 30 and above is considered obese.  Watch levels of cholesterol and blood lipids  You should start having your blood tested for lipids and cholesterol at 79 years of age, then have this test every 5 years.  You may need to have your cholesterol levels checked more often if:  Your lipid or cholesterol levels are high.  You are older than 79 years of age.  You are at high risk for heart disease.  CANCER SCREENING   Lung Cancer  Lung cancer screening is recommended for adults 47-17 years old who are at high risk for lung cancer because of a history of smoking.  A yearly low-dose CT scan of the lungs is recommended for people who:  Currently smoke.  Have quit within the past 15 years.  Have at least a 30-pack-year history of smoking. A pack year is smoking an average of one pack of cigarettes a day for 1 year.  Yearly screening should continue until it has been 15 years since you quit.  Yearly screening should stop if you develop a health problem that would prevent you from having lung cancer treatment.  Breast Cancer  Practice breast self-awareness. This means understanding how your breasts normally appear and feel.  It also means doing regular breast self-exams. Let your health care provider know about any changes, no matter how small.  If you are in your 20s or 30s, you should have a clinical  breast exam (CBE) by a health care provider every 1-3 years as part of a regular health exam.  If you are 47 or older, have a CBE every year. Also consider having a breast X-ray (mammogram) every year.  If you have a family history of breast cancer, talk to your health care provider about genetic screening.  If you are at high risk for breast cancer, talk to your health care provider about having an MRI and a mammogram every year.  Breast cancer gene (BRCA) assessment is recommended for women who have family members with  BRCA-related cancers. BRCA-related cancers include:  Breast.  Ovarian.  Tubal.  Peritoneal cancers.  Results of the assessment will determine the need for genetic counseling and BRCA1 and BRCA2 testing. Cervical Cancer Your health care provider may recommend that you be screened regularly for cancer of the pelvic organs (ovaries, uterus, and vagina). This screening involves a pelvic examination, including checking for microscopic changes to the surface of your cervix (Pap test). You may be encouraged to have this screening done every 3 years, beginning at age 26.  For women ages 95-65, health care providers may recommend pelvic exams and Pap testing every 3 years, or they may recommend the Pap and pelvic exam, combined with testing for human papilloma virus (HPV), every 5 years. Some types of HPV increase your risk of cervical cancer. Testing for HPV may also be done on women of any age with unclear Pap test results.  Other health care providers may not recommend any screening for nonpregnant women who are considered low risk for pelvic cancer and who do not have symptoms. Ask your health care provider if a screening pelvic exam is right for you.  If you have had past treatment for cervical cancer or a condition that could lead to cancer, you need Pap tests and screening for cancer for at least 20 years after your treatment. If Pap tests have been discontinued, your risk factors (such as having a new sexual partner) need to be reassessed to determine if screening should resume. Some women have medical problems that increase the chance of getting cervical cancer. In these cases, your health care provider may recommend more frequent screening and Pap tests. Colorectal Cancer  This type of cancer can be detected and often prevented.  Routine colorectal cancer screening usually begins at 79 years of age and continues through 79 years of age.  Your health care provider may recommend screening at  an earlier age if you have risk factors for colon cancer.  Your health care provider may also recommend using home test kits to check for hidden blood in the stool.  A small camera at the end of a tube can be used to examine your colon directly (sigmoidoscopy or colonoscopy). This is done to check for the earliest forms of colorectal cancer.  Routine screening usually begins at age 60.  Direct examination of the colon should be repeated every 5-10 years through 79 years of age. However, you may need to be screened more often if early forms of precancerous polyps or small growths are found. Skin Cancer  Check your skin from head to toe regularly.  Tell your health care provider about any new moles or changes in moles, especially if there is a change in a mole's shape or color.  Also tell your health care provider if you have a mole that is larger than the size of a pencil eraser.  Always use sunscreen. Apply sunscreen liberally and  repeatedly throughout the day.  Protect yourself by wearing long sleeves, pants, a wide-brimmed hat, and sunglasses whenever you are outside. HEART DISEASE, DIABETES, AND HIGH BLOOD PRESSURE   High blood pressure causes heart disease and increases the risk of stroke. High blood pressure is more likely to develop in:  People who have blood pressure in the high end of the normal range (130-139/85-89 mm Hg).  People who are overweight or obese.  People who are African American.  If you are 6-75 years of age, have your blood pressure checked every 3-5 years. If you are 78 years of age or older, have your blood pressure checked every year. You should have your blood pressure measured twice--once when you are at a hospital or clinic, and once when you are not at a hospital or clinic. Record the average of the two measurements. To check your blood pressure when you are not at a hospital or clinic, you can use:  An automated blood pressure machine at a  pharmacy.  A home blood pressure monitor.  If you are between 33 years and 77 years old, ask your health care provider if you should take aspirin to prevent strokes.  Have regular diabetes screenings. This involves taking a blood sample to check your fasting blood sugar level.  If you are at a normal weight and have a low risk for diabetes, have this test once every three years after 79 years of age.  If you are overweight and have a high risk for diabetes, consider being tested at a younger age or more often. PREVENTING INFECTION  Hepatitis B  If you have a higher risk for hepatitis B, you should be screened for this virus. You are considered at high risk for hepatitis B if:  You were born in a country where hepatitis B is common. Ask your health care provider which countries are considered high risk.  Your parents were born in a high-risk country, and you have not been immunized against hepatitis B (hepatitis B vaccine).  You have HIV or AIDS.  You use needles to inject street drugs.  You live with someone who has hepatitis B.  You have had sex with someone who has hepatitis B.  You get hemodialysis treatment.  You take certain medicines for conditions, including cancer, organ transplantation, and autoimmune conditions. Hepatitis C  Blood testing is recommended for:  Everyone born from [birth year] through 1963.  Anyone with known risk factors for hepatitis C. Sexually transmitted infections (STIs)  You should be screened for sexually transmitted infections (STIs) including gonorrhea and chlamydia if:  You are sexually active and are younger than 79 years of age.  You are older than 79 years of age and your health care provider tells you that you are at risk for this type of infection.  Your sexual activity has changed since you were last screened and you are at an increased risk for chlamydia or gonorrhea. Ask your health care provider if you are at risk.  If you do not  have HIV, but are at risk, it may be recommended that you take a prescription medicine daily to prevent HIV infection. This is called pre-exposure prophylaxis (PrEP). You are considered at risk if:  You are sexually active and do not regularly use condoms or know the HIV status of your partner(s).  You take drugs by injection.  You are sexually active with a partner who has HIV. Talk with your health care provider about whether you are at  high risk of being infected with HIV. If you choose to begin PrEP, you should first be tested for HIV. You should then be tested every 3 months for as long as you are taking PrEP.  PREGNANCY   If you are premenopausal and you may become pregnant, ask your health care provider about preconception counseling.  If you may become pregnant, take 400 to 800 micrograms (mcg) of folic acid every day.  If you want to prevent pregnancy, talk to your health care provider about birth control (contraception). OSTEOPOROSIS AND MENOPAUSE   Osteoporosis is a disease in which the bones lose minerals and strength with aging. This can result in serious bone fractures. Your risk for osteoporosis can be identified using a bone density scan.  If you are 68 years of age or older, or if you are at risk for osteoporosis and fractures, ask your health care provider if you should be screened.  Ask your health care provider whether you should take a calcium or vitamin D supplement to lower your risk for osteoporosis.  Menopause may have certain physical symptoms and risks.  Hormone replacement therapy may reduce some of these symptoms and risks. Talk to your health care provider about whether hormone replacement therapy is right for you.  HOME CARE INSTRUCTIONS   Schedule regular health, dental, and eye exams.  Stay current with your immunizations.   Do not use any tobacco products including cigarettes, chewing tobacco, or electronic cigarettes.  If you are pregnant, do not  drink alcohol.  If you are breastfeeding, limit how much and how often you drink alcohol.  Limit alcohol intake to no more than 1 drink per day for nonpregnant women. One drink equals 12 ounces of beer, 5 ounces of wine, or 1 ounces of hard liquor.  Do not use street drugs.  Do not share needles.  Ask your health care provider for help if you need support or information about quitting drugs.  Tell your health care provider if you often feel depressed.  Tell your health care provider if you have ever been abused or do not feel safe at home.   This information is not intended to replace advice given to you by your health care provider. Make sure you discuss any questions you have with your health care provider.   Document Released: 03/08/2011 Document Revised: 09/13/2014 Document Reviewed: 07/25/2013 Elsevier Interactive Patient Education 2016 [place] Maintenance  Topic Date Due  . INFLUENZA VACCINE  04/06/2016  . TETANUS/TDAP  09/06/2017  . DEXA SCAN  Completed  . ZOSTAVAX  Addressed  . PNA vac Low Risk Adult  Completed

## 2015-07-14 NOTE — Telephone Encounter (Signed)
Patient stated you told her at her last visit, that you would take her and her husband Chelsea Jordan on as a new patient because Dr. Asa Lente leaving. Please advise

## 2015-07-14 NOTE — Progress Notes (Signed)
Subjective:    Patient ID: Chelsea Jordan, female    DOB: 1926-10-18, 81 y.o.   MRN: [id number]  Chief Complaint  Patient presents with  . CPE    Fasting    HPI:  Chelsea Jordan is a 81 y.o. female who presents today for an annual wellness visit.   1) Health Maintenance -   Diet - Averages about 3 meals per day with toast, peanut butter, fruits, vegetables, beef, pork and fish and calcium tabs; Caffeine intake of about 2 cups per day  Exercise - No structured exercise  2) Preventative Exams / Immunizations:  Dental -- Up to date  Vision -- Up to date   Health Maintenance  Topic Date Due  . INFLUENZA VACCINE  04/06/2016  . TETANUS/TDAP  09/06/2017  . DEXA SCAN  Completed  . ZOSTAVAX  Addressed  . PNA vac Low Risk Adult  Completed     Immunization History  Administered Date(s) Administered  . Influenza Split 06/07/2011, 06/13/2012, 06/15/2013  . Influenza Whole 06/06/2009, 05/18/2010  . Influenza, High Dose Seasonal PF 06/15/2013  . Influenza-Unspecified 06/07/2015  . Pneumococcal Conjugate-13 07/12/2014  . Pneumococcal Polysaccharide-23 09/07/2007  . Td 09/07/2007     RISK FACTORS  Tobacco History  Smoking status  . Never Smoker   Smokeless tobacco  . Never Used    Comment: Married, lives with spouse, Enjoys golf. Retired     Cardiac risk factors: advanced age (older than 53 for men, 25 for women), dyslipidemia and hypertension.  Depression Screen  Q1: Over the past two weeks, have you felt down, depressed or hopeless? No  Q2: Over the past two weeks, have you felt little interest or pleasure in doing things? No  Have you lost interest or pleasure in daily life? No  Do you often feel hopeless? No  Do you cry easily over simple problems? No  Activities of Daily Living In your present state of health, do you have any difficulty performing the following activities?:  Driving? No Managing money?  No Feeding yourself? No Getting from bed to chair?  No Climbing a flight of stairs? No Preparing food and eating?: No Bathing or showering? No Getting dressed: No Getting to the toilet? No Using the toilet: No Moving around from place to place: No In the past year have you fallen or had a near fall?:No   Home Safety Has smoke detector and wears seat belts. No firearms. No excess sun exposure. Are there smokers in your home (other than you)?  No Do you feel safe at home?  Yes  Hearing Difficulties: No Do you often ask people to speak up or repeat themselves? No Do you experience ringing or noises in your ears? No  Do you have difficulty understanding soft or whispered voices? No    Cognitive Testing  Alert? Yes   Normal Appearance? Yes  Oriented to person? Yes  Place? Yes   Time? Yes  Recall of three objects?  Yes  Can perform simple calculations? Yes  Displays appropriate judgment? Yes  Can read the correct time from a watch face? Yes  Do you feel that you have a problem with memory? No  Do you often misplace items? No   Advanced Directives have been discussed with the patient? Yes  Current Physicians/Providers and Suppliers  1. Terri Piedra, FNP - Internal Medicine / PCP 2. Silvano Rusk, MD - Gastroenterology 3. Metta Clines, MD - Neurology  4. Arley Phenix, MD - Cardiology 5. Dominica Severin  Benay Spice, MD - Oncology 6. Bjorn Loser, MD - Urology   Indicate any recent Medical Services you may have received from other than [facility] providers in the past year (date may be approximate).  All answers were reviewed with the patient and necessary referrals were made:  Mauricio Po, [place]   07/14/2015    Review of Systems  Constitutional: Denies fever, chills, fatigue, or significant weight gain/loss. HENT: Head: Denies headache or neck pain Ears: Denies changes in hearing, ringing in ears, earache, drainage Nose: Denies discharge, stuffiness, itching, nosebleed, sinus pain Throat: Denies sore throat, hoarseness, dry mouth, sores,  thrush Eyes: Denies loss/changes in vision, pain, redness, blurry/double vision, flashing lights Cardiovascular: Denies chest pain/discomfort, tightness, palpitations, shortness of breath with activity, difficulty lying down, swelling, sudden awakening with shortness of breath Respiratory: Denies shortness of breath, cough, sputum production, wheezing Gastrointestinal: Denies dysphasia, heartburn, change in appetite, nausea, change in bowel habits, rectal bleeding, constipation, diarrhea, yellow skin or eyes Genitourinary: Denies frequency, urgency, burning/pain, blood in urine, incontinence, change in urinary strength. Musculoskeletal: Denies muscle/joint pain, stiffness, back pain, redness or swelling of joints, trauma Skin: Denies rashes, lumps, itching, dryness, color changes, or hair/nail changes Neurological: Denies dizziness, fainting, seizures, weakness, numbness, tingling, tremor Psychiatric - Denies nervousness, stress, depression or memory loss Endocrine: Denies heat or cold intolerance, sweating, frequent urination, excessive thirst, changes in appetite Hematologic: Denies ease of bruising or bleeding    Objective:     BP 138/92 mmHg  Pulse 67  Temp(Src) 97.9 F (36.6 C) (Oral)  Resp 18  Ht 5' 3.5" (1.613 m)  Wt 122 lb (55.339 kg)  BMI 21.27 kg/m2  SpO2 97% Nursing note and vital signs reviewed.  Physical Exam  Constitutional: She is oriented to person, place, and time. She appears well-developed and well-nourished. No distress.  Cardiovascular: Normal rate, regular rhythm, normal heart sounds and intact distal pulses.   Pulmonary/Chest: Effort normal and breath sounds normal.  Neurological: She is alert and oriented to person, place, and time.  Skin: Skin is warm and dry.  Psychiatric: She has a normal mood and affect. Her behavior is normal. Judgment and thought content normal.       Assessment & Plan:   During the course of the visit the patient was educated and  counseled about appropriate screening and preventive services including:    Pneumococcal vaccine   Td vaccine  Glaucoma screening  Nutrition counseling   Diet review for nutrition referral? Yes ____  Not Indicated _X___   Patient Instructions (the written plan) was given to the patient.  Medicare Attestation I have personally reviewed: The patient's medical and social history Their use of alcohol, tobacco or illicit drugs Their current medications and supplements The patient's functional ability including ADLs,fall risks, home safety risks, cognitive, and hearing and visual impairment Diet and physical activities Evidence for depression or mood disorders  The patient's weight, height, BMI,  have been recorded in the chart.  I have made referrals, counseling, and provided education to the patient based on review of the above and I have provided the patient with a written personalized care plan for preventive services.     Mauricio Po, FNP   07/14/2015   Problem List Items Addressed This Visit      Other   Medicare annual wellness visit, subsequent - Primary    Reviewed and updated patient's medical, surgical, family and social history. Medications and allergies were also reviewed. Basic screenings for depression, activities of daily living, hearing,  cognition and safety were performed. Provider list was updated and health plan was provided to the patient.   1) Anticipatory Guidance: Discussed importance of wearing a seatbelt while driving and not texting while driving; changing batteries in smoke detector at least once annually; wearing suntan lotion when outside; eating a balanced and moderate diet; getting physical activity at least 30 minutes per day.  2) Immunizations / Screenings / Labs:  I'll immunizations are up-to-date per recommendations. All screenings are up-to-date per recommendations. Obtain CMET, Lipid profile and TSH.   Overall well exam. Discussed risk  factors for cardiovascular disease and chronic conditions including hypertension and hyperlipidemia. Appears to be well controlled with medication regimen although blood pressure slightly elevated today. Denies adverse side effects of medications. Encouraged increasing physical activity to 30 minutes most days of the week as tolerated including adding aquatic therapy an generalized walking. Continue other healthy lifestyle behaviors and choices. Follow-up prevention exam in one year. Follow-up office visits as needed.        Other Visit Diagnoses    Hyperlipidemia LDL goal <100        Relevant Orders    Comprehensive metabolic panel    TSH    Lipid Profile

## 2015-07-14 NOTE — Progress Notes (Signed)
Pre visit review using our clinic review tool, if applicable. No additional management support is needed unless otherwise documented below in the visit note. 

## 2015-07-14 NOTE — Telephone Encounter (Signed)
Please inform patient her kidney function, liver function, electrolytes, thyroid function and cholesterol are all within good ranges. Therefore please continue to take medications as prescribed and follow up as scheduled.

## 2015-07-15 NOTE — Telephone Encounter (Signed)
Ok with me 

## 2015-07-16 ENCOUNTER — Other Ambulatory Visit: Payer: Self-pay

## 2015-07-16 DIAGNOSIS — Z1231 Encounter for screening mammogram for malignant neoplasm of breast: Secondary | ICD-10-CM

## 2015-07-16 NOTE — Telephone Encounter (Signed)
Pt is aware of results and they are being sent in the mail per pts request.

## 2015-07-16 NOTE — Telephone Encounter (Signed)
PCP changed in both charts

## 2015-07-22 ENCOUNTER — Ambulatory Visit (HOSPITAL_BASED_OUTPATIENT_CLINIC_OR_DEPARTMENT_OTHER): Payer: Medicare Other | Admitting: Oncology

## 2015-07-22 ENCOUNTER — Telehealth: Payer: Self-pay | Admitting: Oncology

## 2015-07-22 VITALS — BP 148/86 | HR 75 | Temp 97.6°F | Resp 18 | Ht 63.5 in | Wt 124.2 lb

## 2015-07-22 DIAGNOSIS — C49A1 Gastrointestinal stromal tumor of esophagus: Secondary | ICD-10-CM | POA: Diagnosis not present

## 2015-07-22 DIAGNOSIS — C49A0 Gastrointestinal stromal tumor, unspecified site: Secondary | ICD-10-CM

## 2015-07-22 NOTE — Telephone Encounter (Signed)
per pof to sch pt appt-gave pt copy of avs °

## 2015-07-22 NOTE — Progress Notes (Signed)
  [place] OFFICE PROGRESS NOTE   Diagnosis: Gastrointestinal stromal tumor  INTERVAL HISTORY:   Ms. Chelsea Jordan returns as scheduled. She feels well. No dysphagia, nausea, or chest pain. No complaint.  Objective:  Vital signs in last 24 hours:  Blood pressure 148/86, pulse 75, temperature 97.6 F (36.4 C), temperature source Oral, resp. rate 18, height 5' 3.5" (1.613 m), weight 124 lb 3.2 oz (56.337 kg), SpO2 96 %.    HEENT: Neck without mass Lymphatics: No cervical or supra-clavicular nodes Resp: Lungs clear bilaterally Cardio: Regular rate and rhythm GI: No hepatomegaly, no mass, no apparent ascites, firm fullness in the subxiphoid region next to a surgical scar Vascular: No leg edema    Medications: I have reviewed the patient's current medications.  Assessment/Plan: 1. 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 History of pill dysphagia secondary to #1  3. Chronic urinary tract infections  4. Squamous cell carcinoma of the left lower leg August 2015    Disposition:  Chelsea Jordan appears stable. She has no symptoms attributable to the gastrointestinal stromal tumor. The plan is to continue observation. She will return for an office visit in 8 months. She will contact us for new symptoms.  Betsy Coder, MD  07/22/2015  11:45 AM

## 2015-08-20 DIAGNOSIS — Z85828 Personal history of other malignant neoplasm of skin: Secondary | ICD-10-CM | POA: Diagnosis not present

## 2015-08-25 ENCOUNTER — Ambulatory Visit
Admission: RE | Admit: 2015-08-25 | Discharge: 2015-08-25 | Disposition: A | Discharge status: Home / Self Care | Payer: Medicare Other | Source: Ambulatory Visit

## 2015-08-25 DIAGNOSIS — Z1231 Encounter for screening mammogram for malignant neoplasm of breast: Secondary | ICD-10-CM

## 2015-08-26 DIAGNOSIS — H47021 Hemorrhage in optic nerve sheath, right eye: Secondary | ICD-10-CM | POA: Diagnosis not present

## 2015-08-26 DIAGNOSIS — H353131 Nonexudative age-related macular degeneration, bilateral, early dry stage: Secondary | ICD-10-CM | POA: Diagnosis not present

## 2015-08-26 DIAGNOSIS — H43811 Vitreous degeneration, right eye: Secondary | ICD-10-CM | POA: Diagnosis not present

## 2015-08-26 DIAGNOSIS — H35363 Drusen (degenerative) of macula, bilateral: Secondary | ICD-10-CM | POA: Diagnosis not present

## 2015-11-12 DIAGNOSIS — J209 Acute bronchitis, unspecified: Secondary | ICD-10-CM | POA: Diagnosis not present

## 2015-11-12 DIAGNOSIS — R0602 Shortness of breath: Secondary | ICD-10-CM | POA: Diagnosis not present

## 2015-11-12 DIAGNOSIS — R05 Cough: Secondary | ICD-10-CM | POA: Diagnosis not present

## 2015-12-01 ENCOUNTER — Other Ambulatory Visit: Payer: Self-pay | Admitting: Cardiology

## 2015-12-14 ENCOUNTER — Other Ambulatory Visit: Payer: Self-pay | Admitting: Internal Medicine

## 2016-01-05 NOTE — Progress Notes (Signed)
HPI The patient returns for one year followup. Since I last saw her she has done well.  She has been diagnosed with GIST.   This is being followed conservatively since she has no symptoms.   She also has a small 4.1 cm thoracic aneurysm noted on a previous CT.  She used to have chest pain but she does not get this currently.   There has been no new shortness of breath, PND or orthopnea. There have been no reported palpitations, presyncope or syncope.  She is active and still golfs although she hasn't golfed recently as she fell and hurt her shoulder.       Allergies  Allergen Reactions  . Pentothal [Thiopental] Nausea And Vomiting  . Codeine   . Levofloxacin   . Oxycodone-Acetaminophen   . Sulfonamide Derivatives     REACTION: GI upset/nausea/vomiting  . Tape Rash    Per patient adhesive tape    Current Outpatient Prescriptions  Medication Sig Dispense Refill  . Ascorbic Acid (VITAMIN C) 500 MG tablet Take 500 mg by mouth daily.      Marland Kitchen aspirin 81 MG tablet Take 81 mg by mouth at bedtime.     . calcium-vitamin D (CALCIUM 500+D) 500-200 MG-UNIT per tablet Take 1 tablet by mouth daily.      . Cholecalciferol (VITAMIN D3) 2000 UNITS TABS Take 2,000 Units by mouth daily.      Marland Kitchen esomeprazole (NEXIUM) 40 MG capsule Take 30- 60 min before your first and last meals of the day 180 capsule 4  . esomeprazole (NEXIUM) 40 MG capsule TAKE ONE CAPSULE BY MOUTH TWICE A DAY 60 capsule 0  . losartan (COZAAR) 50 MG tablet TAKE 1 TABLET BY MOUTH EVERY MORNING 90 tablet 0  . Lutein-Zeaxanthin 20-1 MG CAPS Take 1 capsule by mouth daily.    . Multiple Vitamins-Minerals (CENTRUM SILVER) tablet Take 1 tablet by mouth daily.      . nitrofurantoin (MACRODANTIN) 100 MG capsule Take 100 mg by mouth at bedtime.    . simvastatin (ZOCOR) 5 MG tablet Take 1 tablet (5 mg total) by mouth at bedtime. 90 tablet 3  . vitamin E 400 UNIT capsule Take 400 Units by mouth every morning.     No current facility-administered  medications for this visit.    Past Medical History  Diagnosis Date  . DYSLIPIDEMIA   . HYPERTENSION   . MITRAL VALVE PROLAPSE   . OSTEOARTHRITIS   . OSTEOPOROSIS   . COLONIC POLYPS, HX OF   . Chronic cystitis   . GERD (gastroesophageal reflux disease)   . Macular degeneration     optho q28mo - [facility]  . Meningioma ([place])   . NEPHROLITHIASIS   . Anxiety   . Bronchitis   . Diverticulosis   . Hemorrhoids   . Benign fundic gland polyps of stomach   . Hiatal hernia   . Gastritis   . Complication of anesthesia   . PONV (postoperative nausea and vomiting)   . Cancer ([place])     recent skin cancer left leg  excised about 8 days ago-remains with dressing intact.   . Benign gastrointestinal stromal tumor (GIST)   . Thoracic aortic aneurysm ([facility])     4.1 cm    Past Surgical History  Procedure Laterality Date  . Cholecystectomy    . Abdominal hysterectomy    . Appendectomy    . Knee surgery    . Cataract surgery  2000  . Surgery of lip  in 2002  . Breast surgery    . Tonsillectomy    . Ventral hernia repair  07/04/2012    Procedure: HERNIA REPAIR VENTRAL ADULT;  Surgeon: Odis Hollingshead, MD;  Location: [facility];  Service: General;  Laterality: N/A;  . Insertion of mesh  07/04/2012    Procedure: INSERTION OF MESH;  Surgeon: Odis Hollingshead, MD;  Location: [facility];  Service: General;  Laterality: N/A;  . Leg skin lesion  biopsy / excision Left     8 days ago pending pathology, remains with dressing.  . Eus N/A 05/16/2014    Procedure: UPPER ENDOSCOPIC ULTRASOUND (EUS) LINEAR;  Surgeon: Milus Banister, MD;  Location: [facility];  Service: Endoscopy;  Laterality: N/A;  . Esophagogastroduodenoscopy    . Colonoscopy      ROS:  Lymphadenitis, tired.  Otherwise as stated in the HPI and negative for all other systems.  PHYSICAL EXAM BP 128/80 mmHg  Pulse 70  Ht 5\' 3"  (1.6 m)  Wt 121 lb 2 oz (54.942 kg)  BMI 21.46 kg/m2 GENERAL:  Well appearing looks younger than stated  age NECK:  No jugular venous distention, waveform within normal limits, carotid upstroke brisk and symmetric, no bruits, no thyromegaly LUNGS:  Clear to auscultation bilaterally BACK:  No CVA tenderness HEART:  PMI not displaced or sustained,S1 and S2 within normal limits, no S3, no S4, no clicks, no rubs, soft systolic murmur at the apex. ABD:  Flat, positive bowel sounds normal in frequency in pitch, no bruits, no rebound, no guarding, no midline pulsatile mass, no hepatomegaly, no splenomegaly EXT:  2 plus pulses throughout, no edema, no cyanosis no clubbing   EKG:  Sinus rhythm, rate 70, axis within normal limits, intervals within normal limits, no acute ST-T wave changes.  01/06/2016  ASSESSMENT AND PLAN   AS:  This has been very mild on echo in the past. I would not suspect any change based on physical exam and lack of symptoms.  I will check with the echo as below  MVP:  I don't hear any significant regurgitant murmur clicks. No further imaging is planned.  Again this will be evaluated by echo.   HTN:    The blood pressure is at target. No change in medications is indicated. We will continue with therapeutic lifestyle changes (TLC).  THORACIC ANEURYSM:  This is small.  I cannot tell at what level this was as it was an incidental finding on a previous chest CT.  I will see if we can image this with an echocardiogram.   Records reviewed:  GI/Oncology office notes.

## 2016-01-06 ENCOUNTER — Encounter: Payer: Self-pay | Admitting: Cardiology

## 2016-01-06 ENCOUNTER — Other Ambulatory Visit: Payer: Self-pay | Admitting: Cardiology

## 2016-01-06 ENCOUNTER — Ambulatory Visit (INDEPENDENT_AMBULATORY_CARE_PROVIDER_SITE_OTHER): Payer: Medicare Other | Admitting: Cardiology

## 2016-01-06 VITALS — BP 128/80 | HR 70 | Ht 63.0 in | Wt 121.1 lb

## 2016-01-06 DIAGNOSIS — I10 Essential (primary) hypertension: Secondary | ICD-10-CM

## 2016-01-06 DIAGNOSIS — I712 Thoracic aortic aneurysm, without rupture, unspecified: Secondary | ICD-10-CM

## 2016-01-06 DIAGNOSIS — I350 Nonrheumatic aortic (valve) stenosis: Secondary | ICD-10-CM

## 2016-01-06 NOTE — Patient Instructions (Signed)
Medication Instructions:  Continue current medication therapy  Labwork: NONE  Testing/Procedures: Your physician has requested that you have an echocardiogram. Echocardiography is a painless test that uses sound waves to create images of your heart. It provides your doctor with information about the size and shape of your heart and how well your heart's chambers and valves are working. This procedure takes approximately one hour. There are no restrictions for this procedure.  Follow-Up: 1 Year  Any Other Special Instructions Will Be Listed Below (If Applicable).   If you need a refill on your cardiac medications before your next appointment, please call your pharmacy.

## 2016-01-13 ENCOUNTER — Ambulatory Visit (HOSPITAL_COMMUNITY)
Admission: RE | Admit: 2016-01-13 | Discharge: 2016-01-13 | Disposition: A | Discharge status: Home / Self Care | Payer: Medicare Other | Source: Ambulatory Visit | Attending: Cardiovascular Disease | Admitting: Cardiovascular Disease

## 2016-01-13 DIAGNOSIS — I712 Thoracic aortic aneurysm, without rupture, unspecified: Secondary | ICD-10-CM

## 2016-01-13 DIAGNOSIS — I119 Hypertensive heart disease without heart failure: Secondary | ICD-10-CM | POA: Insufficient documentation

## 2016-01-13 DIAGNOSIS — I071 Rheumatic tricuspid insufficiency: Secondary | ICD-10-CM | POA: Insufficient documentation

## 2016-01-13 DIAGNOSIS — I351 Nonrheumatic aortic (valve) insufficiency: Secondary | ICD-10-CM | POA: Insufficient documentation

## 2016-01-13 DIAGNOSIS — I313 Pericardial effusion (noninflammatory): Secondary | ICD-10-CM | POA: Insufficient documentation

## 2016-01-13 DIAGNOSIS — I341 Nonrheumatic mitral (valve) prolapse: Secondary | ICD-10-CM | POA: Insufficient documentation

## 2016-01-13 DIAGNOSIS — I340 Nonrheumatic mitral (valve) insufficiency: Secondary | ICD-10-CM | POA: Insufficient documentation

## 2016-01-13 DIAGNOSIS — I77810 Thoracic aortic ectasia: Secondary | ICD-10-CM | POA: Insufficient documentation

## 2016-01-13 DIAGNOSIS — E785 Hyperlipidemia, unspecified: Secondary | ICD-10-CM | POA: Insufficient documentation

## 2016-01-13 DIAGNOSIS — I350 Nonrheumatic aortic (valve) stenosis: Secondary | ICD-10-CM

## 2016-01-15 IMAGING — CR DG CHEST 2V
2 series · 2 of 2 positions shown · non-contrast
Comparison: 06/28/2012.

CLINICAL DATA: Hypertension.

EXAM:
CHEST  2 VIEW

[view not recorded (1 of 2)]
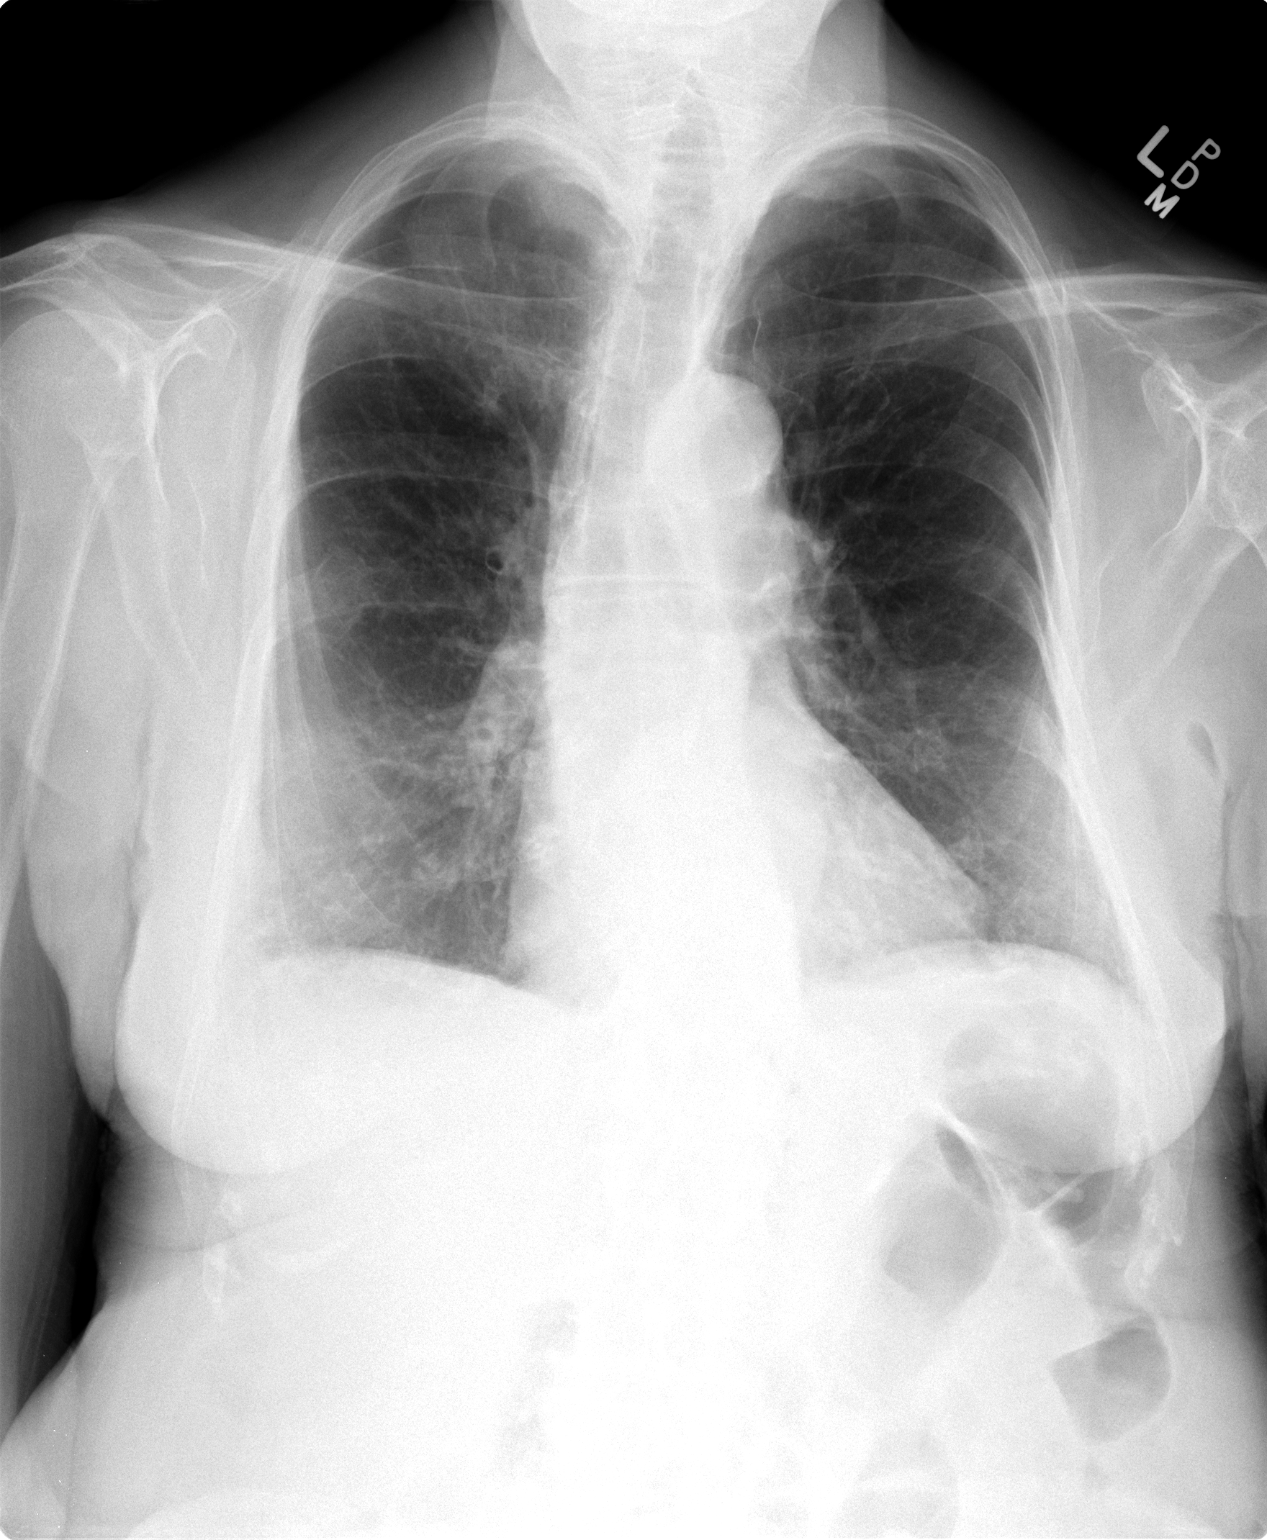

[view not recorded (2 of 2)]
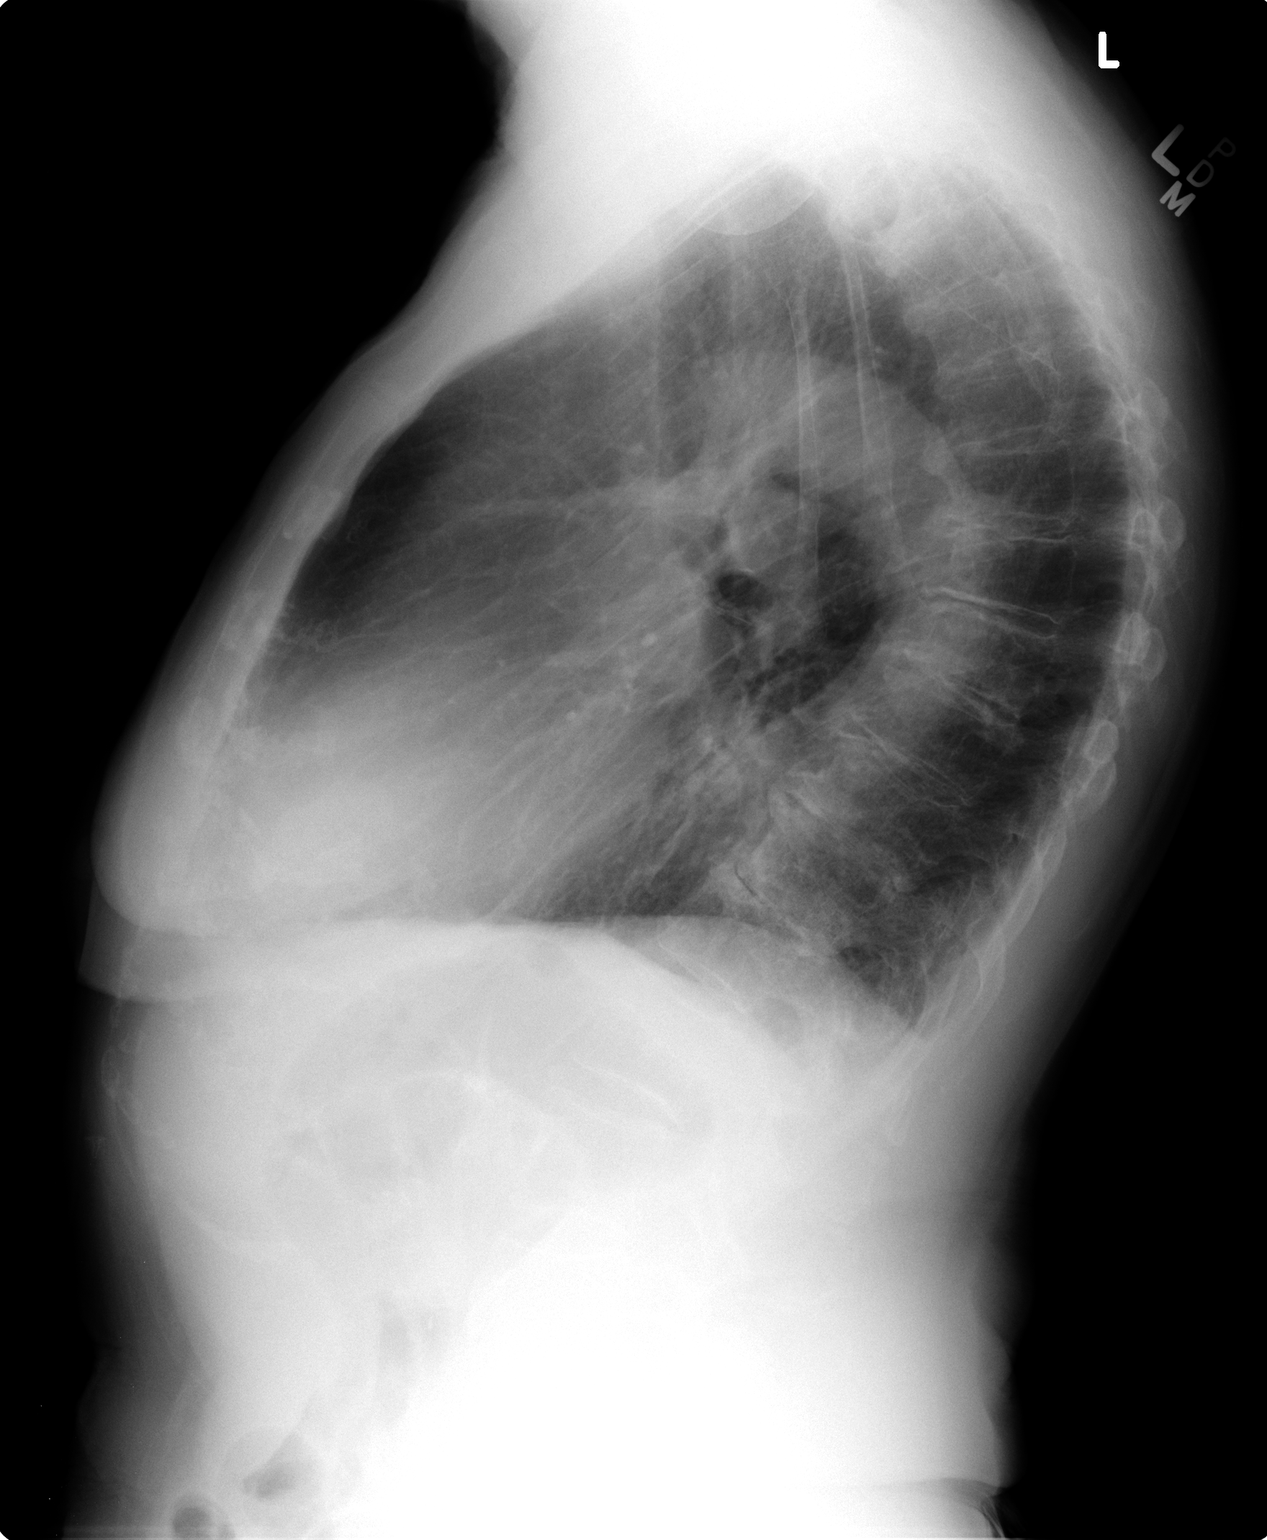

[2 of 2 positions shown; findings below may reference images not displayed]

FINDINGS: Mediastinum and hilar structures are normal. Lungs are clear of
acute infiltrates. No pleural effusion or pneumothorax. Changes
suggesting COPD noted. Cardiomegaly with normal pulmonary
vascularity. Degenerative changes thoracic spine.
IMPRESSION: COPD.

## 2016-01-16 ENCOUNTER — Other Ambulatory Visit: Payer: Self-pay | Admitting: Cardiology

## 2016-01-20 ENCOUNTER — Telehealth: Payer: Self-pay | Admitting: Cardiology

## 2016-01-20 NOTE — Telephone Encounter (Signed)
Results given to pt as per Wynelle Link

## 2016-01-20 NOTE — Telephone Encounter (Signed)
Returning your call from this morning. °

## 2016-02-02 ENCOUNTER — Other Ambulatory Visit: Payer: Self-pay | Admitting: Internal Medicine

## 2016-02-05 ENCOUNTER — Telehealth: Payer: Self-pay | Admitting: Internal Medicine

## 2016-02-05 NOTE — Telephone Encounter (Signed)
Patient is requesting call back in regards to esomeprazole.  States script is not correct in dosage or amount.

## 2016-02-18 ENCOUNTER — Telehealth: Payer: Self-pay

## 2016-02-18 DIAGNOSIS — Z85828 Personal history of other malignant neoplasm of skin: Secondary | ICD-10-CM | POA: Diagnosis not present

## 2016-02-18 DIAGNOSIS — L720 Epidermal cyst: Secondary | ICD-10-CM | POA: Diagnosis not present

## 2016-02-18 NOTE — Telephone Encounter (Signed)
i called patient to see if she wanted to start prolia injections again (last injection was 04/2014)---patient stated that she stopped injections before because of problem with jawbone---a possible side effect of prolia is osteonecrosis of jaw---however, she wanted me to check with you to see if you recommended for her to begin injections again---if you think that benefits of prolia outweigh the possible jawbone problem occuring again, then she will restart prolia----please advise, i will call patient back---thanks

## 2016-02-18 NOTE — Telephone Encounter (Signed)
Ok to restart unless she was actually diagnosed with osteonecrosis

## 2016-02-19 NOTE — Telephone Encounter (Signed)
Left message advising of dr Gwynn Burly note---call office back and talk with Laraine Samet if she wants to discuss prolia injections

## 2016-03-03 ENCOUNTER — Other Ambulatory Visit: Payer: Self-pay | Admitting: Internal Medicine

## 2016-03-12 ENCOUNTER — Telehealth: Payer: Self-pay | Admitting: Emergency Medicine

## 2016-03-12 MED ORDER — ESOMEPRAZOLE MAGNESIUM 40 MG PO CPDR: DELAYED_RELEASE_CAPSULE | ORAL | Status: DC

## 2016-03-12 NOTE — Telephone Encounter (Signed)
Patient called and needs a 3 month supply instead of a month supply of esomeprazole (NEXIUM) 40 MG capsule. Pharmacy is holding current prescription for new one. Pharmacy [facility] [address]. Thanks.

## 2016-03-12 NOTE — Telephone Encounter (Signed)
New prescription sent to pharmacy 

## 2016-04-20 ENCOUNTER — Telehealth: Payer: Self-pay | Admitting: Oncology

## 2016-04-20 ENCOUNTER — Ambulatory Visit (HOSPITAL_BASED_OUTPATIENT_CLINIC_OR_DEPARTMENT_OTHER): Payer: Medicare Other | Admitting: Oncology

## 2016-04-20 VITALS — BP 140/76 | HR 67 | Temp 98.0°F | Resp 17 | Ht 63.0 in | Wt 124.2 lb

## 2016-04-20 DIAGNOSIS — C49A1 Gastrointestinal stromal tumor of esophagus: Secondary | ICD-10-CM

## 2016-04-20 DIAGNOSIS — Z87440 Personal history of urinary (tract) infections: Secondary | ICD-10-CM

## 2016-04-20 DIAGNOSIS — C49A0 Gastrointestinal stromal tumor, unspecified site: Secondary | ICD-10-CM

## 2016-04-20 DIAGNOSIS — C7652 Malignant neoplasm of left lower limb: Secondary | ICD-10-CM

## 2016-04-20 NOTE — Telephone Encounter (Signed)
GAVE PATIENT AVS REPORT AND APPOINTMENTS FOR MAY 2018

## 2016-04-20 NOTE — Progress Notes (Signed)
  [facility] OFFICE PROGRESS NOTE   Diagnosis: Gastrointestinal stromal tumor  INTERVAL HISTORY:   Chelsea Jordan returns as scheduled per she feels well. No nausea, no dysphagia. Good appetite. No complaint.  Objective:  Vital signs in last 24 hours:  Blood pressure 140/76, pulse 67, temperature 98 F (36.7 C), temperature source Oral, resp. rate 17, height 5\' 3"  (1.6 m), weight 124 lb 3.2 oz (56.3 kg), SpO2 96 %.    HEENT: Neck without mass Lymphatics: No cervical or supraclavicular nodes Resp: Lungs clear bilaterally Cardio: Regular rate and rhythm GI: No hepatosplenomegaly, no mass, nontender Vascular: No leg edema   Medications: I have reviewed the patient's current medications.  Assessment/Plan: 1. 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 2.6 cm muscularis propria mass at 30 cm from the incisors, 9 mm similar appearing lesion at the GE junction  2. History of pill dysphagia secondary to #1  3. Chronic urinary tract infections  4. Squamous cell carcinoma of the left lower leg August 2015    Disposition:  Chelsea Jordan remains asymptomatic from the gastrointestinal stromal tumor. She will return for an office visit in 9 months. She will contact us for new symptoms. The plan is to continue observation.  Betsy Coder, MD  04/20/2016  12:31 PM

## 2016-06-07 ENCOUNTER — Encounter: Payer: Self-pay | Admitting: Student

## 2016-07-10 ENCOUNTER — Other Ambulatory Visit: Payer: Self-pay | Admitting: Internal Medicine

## 2016-07-15 ENCOUNTER — Other Ambulatory Visit (INDEPENDENT_AMBULATORY_CARE_PROVIDER_SITE_OTHER): Payer: Medicare Other

## 2016-07-15 ENCOUNTER — Other Ambulatory Visit: Payer: Medicare Other

## 2016-07-15 ENCOUNTER — Encounter: Payer: Self-pay | Admitting: Internal Medicine

## 2016-07-15 ENCOUNTER — Ambulatory Visit (INDEPENDENT_AMBULATORY_CARE_PROVIDER_SITE_OTHER): Payer: Medicare Other | Admitting: Internal Medicine

## 2016-07-15 VITALS — BP 132/72 | HR 70 | Temp 98.7°F | Resp 20 | Wt 124.0 lb

## 2016-07-15 DIAGNOSIS — I10 Essential (primary) hypertension: Secondary | ICD-10-CM | POA: Diagnosis not present

## 2016-07-15 DIAGNOSIS — Z0001 Encounter for general adult medical examination with abnormal findings: Secondary | ICD-10-CM

## 2016-07-15 DIAGNOSIS — Z0000 Encounter for general adult medical examination without abnormal findings: Secondary | ICD-10-CM

## 2016-07-15 LAB — HEPATIC FUNCTION PANEL
ALBUMIN: 4.3 g/dL (ref 3.5–5.2)
ALT: 12 U/L (ref 0–35)
AST: 17 U/L (ref 0–37)
Alkaline Phosphatase: 94 U/L (ref 39–117)
Bilirubin, Direct: 0.1 mg/dL (ref 0.0–0.3)
Total Bilirubin: 0.7 mg/dL (ref 0.2–1.2)
Total Protein: 6.7 g/dL (ref 6.0–8.3)

## 2016-07-15 LAB — CBC WITH DIFFERENTIAL/PLATELET
BASOS ABS: 0 10*3/uL (ref 0.0–0.1)
Basophils Relative: 0.1 % (ref 0.0–3.0)
EOS PCT: 3.4 % (ref 0.0–5.0)
Eosinophils Absolute: 0.3 10*3/uL (ref 0.0–0.7)
HCT: 42.2 % (ref 36.0–46.0)
HEMOGLOBIN: 14.2 g/dL (ref 12.0–15.0)
LYMPHS ABS: 1.4 10*3/uL (ref 0.7–4.0)
LYMPHS PCT: 17.3 % (ref 12.0–46.0)
MCHC: 33.6 g/dL (ref 30.0–36.0)
MCV: 90.3 fl (ref 78.0–100.0)
MONOS PCT: 8.4 % (ref 3.0–12.0)
Monocytes Absolute: 0.7 10*3/uL (ref 0.1–1.0)
NEUTROS PCT: 70.8 % (ref 43.0–77.0)
Neutro Abs: 5.9 10*3/uL (ref 1.4–7.7)
Platelets: 201 10*3/uL (ref 150.0–400.0)
RBC: 4.68 Mil/uL (ref 3.87–5.11)
RDW: 13.3 % (ref 11.5–15.5)
WBC: 8.3 10*3/uL (ref 4.0–10.5)

## 2016-07-15 LAB — BASIC METABOLIC PANEL
BUN: 14 mg/dL (ref 6–23)
CHLORIDE: 102 meq/L (ref 96–112)
CO2: 30 mEq/L (ref 19–32)
Calcium: 9.8 mg/dL (ref 8.4–10.5)
Creatinine, Ser: 0.5 mg/dL (ref 0.40–1.20)
GFR: 123.92 mL/min (ref >60.00)
GLUCOSE: 96 mg/dL (ref 70–99)
POTASSIUM: 3.9 meq/L (ref 3.5–5.1)
Sodium: 139 mEq/L (ref 135–145)

## 2016-07-15 LAB — TSH: TSH: 2.79 u[IU]/mL (ref 0.35–4.50)

## 2016-07-15 LAB — LIPID PANEL
CHOLESTEROL: 196 mg/dL (ref 0–200)
HDL: 93 mg/dL (ref >39.00)
LDL CALC: 82 mg/dL (ref 0–99)
NonHDL: 103.04
TRIGLYCERIDES: 107 mg/dL (ref 0.0–149.0)
Total CHOL/HDL Ratio: 2
VLDL: 21.4 mg/dL (ref 0.0–40.0)

## 2016-07-15 MED ORDER — SIMVASTATIN 5 MG PO TABS: 5.0000 mg | ORAL_TABLET | Freq: Every day | ORAL | 3 refills | Status: DC

## 2016-07-15 NOTE — Patient Instructions (Addendum)
Please continue all other medications as before, and refills have been done if requested.  Please have the pharmacy call with any other refills you may need.  Please continue your efforts at being more active, low cholesterol diet, and weight control.  You are otherwise up to date with prevention measures today.  Please keep your appointments with your specialists as you may have planned  Please go to the LAB in the Basement (turn left off the elevator) for the tests to be done today  You will be contacted by phone if any changes need to be made immediately.  Otherwise, you will receive a letter about your results with an explanation, but please check with MyChart first.  Please remember to sign up for MyChart if you have not done so, as this will be important to you in the future with finding out test results, communicating by private email, and scheduling acute appointments online when needed.  If you have Medicare related insurance (such as traditoinal Medicare, Blue Cross Medicare or United HealthCare Medicare, or similar), Please make an appointment at the Scheduling desk with Kim, the Wellness Health[facility], for your Wellness Visit in this office, which is a benefit with your insurance.  Please return in 6 months, or sooner if needed 

## 2016-07-15 NOTE — Progress Notes (Signed)
Subjective:    Patient ID: Chelsea Jordan, female    DOB: 03-03-1927, 82 y.o.   MRN: [id number]  HPI  Here for wellness and f/u;  Overall doing ok;  Pt denies Chest pain, worsening SOB, DOE, wheezing, orthopnea, PND, worsening LE edema, palpitations, dizziness or syncope.  Pt denies neurological change such as new headache, facial or extremity weakness.  Pt denies polydipsia, polyuria, or low sugar symptoms. Pt states overall good compliance with treatment and medications, good tolerability, and has been trying to follow appropriate diet.  Pt denies worsening depressive symptoms, suicidal ideation or panic. No fever, night sweats, wt loss, loss of appetite, or other constitutional symptoms.  Pt states good ability with ADL's, has low fall risk, home safety reviewed and adequate, no other significant changes in hearing or vision, and only occasionally active with exercise.  No other new hx findings.   Past Medical History:  Diagnosis Date  . Anxiety   . Benign fundic gland polyps of stomach   . Benign gastrointestinal stromal tumor (GIST)   . Bronchitis   . Cancer ([place])    recent skin cancer left leg  excised about 8 days ago-remains with dressing intact.   . Chronic cystitis   . COLONIC POLYPS, HX OF   . Complication of anesthesia   . Diverticulosis   . DYSLIPIDEMIA   . Gastritis   . GERD (gastroesophageal reflux disease)   . Hemorrhoids   . Hiatal hernia   . HYPERTENSION   . Macular degeneration    optho q60mo - Hecker  . Meningioma ([place])   . MITRAL VALVE PROLAPSE   . NEPHROLITHIASIS   . OSTEOARTHRITIS   . OSTEOPOROSIS   . PONV (postoperative nausea and vomiting)   . Thoracic aortic aneurysm (HCC)    4.1 cm   Past Surgical History:  Procedure Laterality Date  . ABDOMINAL HYSTERECTOMY    . APPENDECTOMY    . BREAST SURGERY    . Cataract surgery  2000  . CHOLECYSTECTOMY    . COLONOSCOPY    . ESOPHAGOGASTRODUODENOSCOPY    . EUS N/A 05/16/2014   Procedure: UPPER ENDOSCOPIC  ULTRASOUND (EUS) LINEAR;  Surgeon: Milus Banister, MD;  Location: [facility];  Service: Endoscopy;  Laterality: N/A;  . INSERTION OF MESH  07/04/2012   Procedure: INSERTION OF MESH;  Surgeon: Odis Hollingshead, MD;  Location: [place];  Service: General;  Laterality: N/A;  . KNEE SURGERY    . LEG SKIN LESION  BIOPSY / EXCISION Left    8 days ago pending pathology, remains with dressing.  . SURGERY OF LIP  in 2002  . TONSILLECTOMY    . VENTRAL HERNIA REPAIR  07/04/2012   Procedure: HERNIA REPAIR VENTRAL ADULT;  Surgeon: Odis Hollingshead, MD;  Location: [place];  Service: General;  Laterality: N/A;    reports that she has never smoked. She has never used smokeless tobacco. She reports that she drinks alcohol. She reports that she does not use drugs. family history includes Breast cancer in her sister; Emphysema in her father; Heart disease in her father, mother, and other; Lung cancer in her sister; Prostate cancer in her brother and son. Allergies  Allergen Reactions  . Pentothal [Thiopental] Nausea And Vomiting  . Codeine   . Levofloxacin   . Oxycodone-Acetaminophen   . Sulfonamide Derivatives     REACTION: GI upset/nausea/vomiting  . Tape Rash    Per patient adhesive tape   Current Outpatient Prescriptions on File Prior  to Visit  Medication Sig Dispense Refill  . Ascorbic Acid (VITAMIN C) 500 MG tablet Take 500 mg by mouth daily.      Marland Kitchen aspirin 81 MG tablet Take 81 mg by mouth at bedtime.     . calcium-vitamin D (CALCIUM 500+D) 500-200 MG-UNIT per tablet Take 1 tablet by mouth daily.      . Cholecalciferol (VITAMIN D3) 2000 UNITS TABS Take 2,000 Units by mouth daily.      Marland Kitchen esomeprazole (NEXIUM) 40 MG capsule Take 30- 60 min before your first and last meals of the day 180 capsule 4  . losartan (COZAAR) 50 MG tablet TAKE 1 TABLET BY MOUTH EVERY MORNING 90 tablet 3  . Lutein-Zeaxanthin 20-1 MG CAPS Take 1 capsule by mouth daily.    . Multiple Vitamins-Minerals (CENTRUM SILVER) tablet  Take 1 tablet by mouth daily.      . nitrofurantoin (MACRODANTIN) 100 MG capsule Take 100 mg by mouth at bedtime.    . vitamin E 400 UNIT capsule Take 400 Units by mouth every morning.     No current facility-administered medications on file prior to visit.    Review of Systems Constitutional: Negative for increased diaphoresis, or other activity, appetite or siginficant weight change other than noted HENT: Negative for worsening hearing loss, ear pain, facial swelling, mouth sores and neck stiffness.   Eyes: Negative for other worsening pain, redness or visual disturbance.  Respiratory: Negative for choking or stridor Cardiovascular: Negative for other chest pain and palpitations.  Gastrointestinal: Negative for worsening diarrhea, blood in stool, or abdominal distention Genitourinary: Negative for hematuria, flank pain or change in urine volume.  Musculoskeletal: Negative for myalgias or other joint complaints.  Skin: Negative for other color change and wound or drainage.  Neurological: Negative for syncope and numbness. other than noted Hematological: Negative for adenopathy. or other swelling Psychiatric/Behavioral: Negative for hallucinations, SI, self-injury, decreased concentration or other worsening agitation.  All other system neg per pt    Objective:   Physical Exam BP 132/72   Pulse 70   Temp 98.7 F (37.1 C) (Oral)   Resp 20   Wt 124 lb (56.2 kg)   SpO2 96%   BMI 21.97 kg/m  VS noted,  Constitutional: Pt is oriented to person, place, and time. Appears well-developed and well-nourished, in no significant distress Head: Normocephalic and atraumatic  Eyes: Conjunctivae and EOM are normal. Pupils are equal, round, and reactive to light Right Ear: External ear normal.  Left Ear: External ear normal Nose: Nose normal.  Mouth/Throat: Oropharynx is clear and moist  Neck: Normal range of motion. Neck supple. No JVD present. No tracheal deviation present or significant neck  LA or mass Cardiovascular: Normal rate, regular rhythm, normal heart sounds and intact distal pulses.   Pulmonary/Chest: Effort normal and breath sounds without rales or wheezing  Abdominal: Soft. Bowel sounds are normal. NT. No HSM  Musculoskeletal: Normal range of motion. Exhibits no edema Lymphadenopathy: Has no cervical adenopathy.  Neurological: Pt is alert and oriented to person, place, and time. Pt has normal reflexes. No cranial nerve deficit. Motor grossly intact Skin: Skin is warm and dry. No rash noted or new ulcers Psychiatric:  Has normal mood and affect. Behavior is normal.  No other new exam findings    Assessment & Plan:

## 2016-07-15 NOTE — Progress Notes (Signed)
Pre visit review using our clinic review tool, if applicable. No additional management support is needed unless otherwise documented below in the visit note. 

## 2016-07-18 NOTE — Assessment & Plan Note (Signed)
stable overall by history and exam, recent data reviewed with pt, and pt to continue medical treatment as before,  to f/u any worsening symptoms or concerns BP Readings from Last 3 Encounters:  07/15/16 132/72  04/20/16 140/76  01/06/16 128/80

## 2016-07-18 NOTE — Assessment & Plan Note (Signed)

## 2016-07-21 ENCOUNTER — Other Ambulatory Visit: Payer: Self-pay | Admitting: Internal Medicine

## 2016-07-21 DIAGNOSIS — Z1231 Encounter for screening mammogram for malignant neoplasm of breast: Secondary | ICD-10-CM

## 2016-08-24 DIAGNOSIS — H35363 Drusen (degenerative) of macula, bilateral: Secondary | ICD-10-CM | POA: Diagnosis not present

## 2016-08-24 DIAGNOSIS — H353131 Nonexudative age-related macular degeneration, bilateral, early dry stage: Secondary | ICD-10-CM | POA: Diagnosis not present

## 2016-08-24 DIAGNOSIS — H43811 Vitreous degeneration, right eye: Secondary | ICD-10-CM | POA: Diagnosis not present

## 2016-08-25 ENCOUNTER — Ambulatory Visit: Payer: Self-pay

## 2016-09-08 ENCOUNTER — Ambulatory Visit: Payer: Self-pay

## 2016-09-20 ENCOUNTER — Ambulatory Visit
Admission: RE | Admit: 2016-09-20 | Discharge: 2016-09-20 | Disposition: A | Discharge status: Home / Self Care | Payer: Medicare Other | Source: Ambulatory Visit | Attending: Internal Medicine | Admitting: Internal Medicine

## 2016-09-20 DIAGNOSIS — Z1231 Encounter for screening mammogram for malignant neoplasm of breast: Secondary | ICD-10-CM

## 2016-12-25 ENCOUNTER — Encounter: Payer: Self-pay | Admitting: Internal Medicine

## 2016-12-25 ENCOUNTER — Ambulatory Visit (INDEPENDENT_AMBULATORY_CARE_PROVIDER_SITE_OTHER): Payer: Medicare Other | Admitting: Internal Medicine

## 2016-12-25 VITALS — BP 126/76 | HR 75 | Temp 97.4°F | Wt 125.0 lb

## 2016-12-25 DIAGNOSIS — K529 Noninfective gastroenteritis and colitis, unspecified: Secondary | ICD-10-CM

## 2016-12-25 MED ORDER — ONDANSETRON HCL 4 MG PO TABS: 4.0000 mg | ORAL_TABLET | Freq: Three times a day (TID) | ORAL | 0 refills | Status: DC | PRN

## 2016-12-25 NOTE — Patient Instructions (Signed)
You may want to try a probiotic (like activia yogurt or a tablet at pharmacy). Use the prescription (ondansetron) if you have nausea.

## 2016-12-25 NOTE — Assessment & Plan Note (Addendum)
Husband did have some diarrhea--but on Rx for UTI (not GI) No dehydration--discussed keeping up fluids Consider probiotic Ondansetron for prn Limit food for now

## 2016-12-25 NOTE — Progress Notes (Signed)
Subjective:    Patient ID: Chelsea Jordan, female    DOB: [birth year], 83 y.o.   MRN: [id number]  HPI Here due to diarrhea  Thinks she has E coli Husband hospitalized with UTI with E coli but now home again 3 days ago  Having diarrhea ---mostly water This started yesterday Only twice today Awoken with bilateral pelvic pain--along inguinal line. Very brief Some nausea but no vomiting Didn't eat last night Able to drink water though  No fever Felt cold but no sweats or chills  No treatment  Current Outpatient Prescriptions on File Prior to Visit  Medication Sig Dispense Refill  . Ascorbic Acid (VITAMIN C) 500 MG tablet Take 500 mg by mouth daily.      Marland Kitchen aspirin 81 MG tablet Take 81 mg by mouth at bedtime.     . calcium-vitamin D (CALCIUM 500+D) 500-200 MG-UNIT per tablet Take 1 tablet by mouth daily.      . Cholecalciferol (VITAMIN D3) 2000 UNITS TABS Take 2,000 Units by mouth daily.      Marland Kitchen esomeprazole (NEXIUM) 40 MG capsule Take 30- 60 min before your first and last meals of the day 180 capsule 4  . losartan (COZAAR) 50 MG tablet TAKE 1 TABLET BY MOUTH EVERY MORNING 90 tablet 3  . Lutein-Zeaxanthin 20-1 MG CAPS Take 1 capsule by mouth daily.    . Multiple Vitamins-Minerals (CENTRUM SILVER) tablet Take 1 tablet by mouth daily.      . nitrofurantoin (MACRODANTIN) 100 MG capsule Take 100 mg by mouth at bedtime.    . simvastatin (ZOCOR) 5 MG tablet Take 1 tablet (5 mg total) by mouth at bedtime. Overdue for yearly physical w/labs must see MD for refills 90 tablet 3  . vitamin E 400 UNIT capsule Take 400 Units by mouth every morning.     No current facility-administered medications on file prior to visit.     Allergies  Allergen Reactions  . Pentothal [Thiopental] Nausea And Vomiting  . Codeine   . Levofloxacin   . Oxycodone-Acetaminophen   . Sulfonamide Derivatives     REACTION: GI upset/nausea/vomiting  . Tape Rash    Per patient adhesive tape    Past Medical History:    Diagnosis Date  . Anxiety   . Benign fundic gland polyps of stomach   . Benign gastrointestinal stromal tumor (GIST)   . Bronchitis   . Cancer ([place])    recent skin cancer left leg  excised about 8 days ago-remains with dressing intact.   . Chronic cystitis   . COLONIC POLYPS, HX OF   . Complication of anesthesia   . Diverticulosis   . DYSLIPIDEMIA   . Gastritis   . GERD (gastroesophageal reflux disease)   . Hemorrhoids   . Hiatal hernia   . HYPERTENSION   . Macular degeneration    optho q63mo - Hecker  . Meningioma ([place])   . MITRAL VALVE PROLAPSE   . NEPHROLITHIASIS   . OSTEOARTHRITIS   . OSTEOPOROSIS   . PONV (postoperative nausea and vomiting)   . Thoracic aortic aneurysm (HCC)    4.1 cm    Past Surgical History:  Procedure Laterality Date  . ABDOMINAL HYSTERECTOMY    . APPENDECTOMY    . BREAST SURGERY    . Cataract surgery  2000  . CHOLECYSTECTOMY    . COLONOSCOPY    . ESOPHAGOGASTRODUODENOSCOPY    . EUS N/A 05/16/2014   Procedure: UPPER ENDOSCOPIC ULTRASOUND (EUS) LINEAR;  Surgeon: Quillian Quince  Merrily Brittle, MD;  Location: [facility];  Service: Endoscopy;  Laterality: N/A;  . INSERTION OF MESH  07/04/2012   Procedure: INSERTION OF MESH;  Surgeon: Odis Hollingshead, MD;  Location: [place];  Service: General;  Laterality: N/A;  . KNEE SURGERY    . LEG SKIN LESION  BIOPSY / EXCISION Left    8 days ago pending pathology, remains with dressing.  . SURGERY OF LIP  in 2002  . TONSILLECTOMY    . VENTRAL HERNIA REPAIR  07/04/2012   Procedure: HERNIA REPAIR VENTRAL ADULT;  Surgeon: Odis Hollingshead, MD;  Location: [place];  Service: General;  Laterality: N/A;    Family History  Problem Relation Age of Onset  . Heart disease Other   . Heart disease Mother   . Heart disease Father   . Emphysema Father   . Breast cancer Sister   . Lung cancer Sister   . Prostate cancer Son   . Prostate cancer Brother     Social History   Social History  . Marital status: Married     Spouse name: N/A  . Number of children: 2  . Years of education: N/A   Occupational History  . retired    Social History Main Topics  . Smoking status: Never Smoker  . Smokeless tobacco: Never Used     Comment: Married, lives with spouse, Enjoys golf. Retired  . Alcohol use 0.0 oz/week     Comment: Wine -nightly  . Drug use: No  . Sexual activity: Not Currently    Birth control/ protection:    Other Topics Concern  . Not on file   Social History Narrative   Married, lives with spouse - enjoys golf   Review of Systems  No dizziness Slight temporal headache Chronic cough-no change No SOB ---other than when coughing     Objective:   Physical Exam  Constitutional: She appears well-nourished. No distress.  Neck: No thyromegaly present.  Pulmonary/Chest: Effort normal and breath sounds normal. No respiratory distress. She has no wheezes. She has no rales.  Abdominal: She exhibits no distension and no mass. There is no rebound and no guarding.  Hyperactive bowel sounds Slight left sided tenderness  Musculoskeletal: She exhibits no edema.  Lymphadenopathy:    She has no cervical adenopathy.          Assessment & Plan:

## 2016-12-29 ENCOUNTER — Telehealth: Payer: Self-pay

## 2016-12-29 NOTE — Telephone Encounter (Signed)
Approved from 12/05/16-12/29/17 [place] [id number]

## 2017-01-18 ENCOUNTER — Telehealth: Payer: Self-pay | Admitting: Oncology

## 2017-01-18 ENCOUNTER — Ambulatory Visit (HOSPITAL_BASED_OUTPATIENT_CLINIC_OR_DEPARTMENT_OTHER): Payer: Medicare Other | Admitting: Oncology

## 2017-01-18 VITALS — BP 138/92 | HR 67 | Temp 98.7°F | Resp 18 | Ht 63.0 in | Wt 124.5 lb

## 2017-01-18 DIAGNOSIS — Z87440 Personal history of urinary (tract) infections: Secondary | ICD-10-CM | POA: Diagnosis not present

## 2017-01-18 DIAGNOSIS — C499 Malignant neoplasm of connective and soft tissue, unspecified: Secondary | ICD-10-CM

## 2017-01-18 DIAGNOSIS — C7652 Malignant neoplasm of left lower limb: Secondary | ICD-10-CM

## 2017-01-18 DIAGNOSIS — C49A1 Gastrointestinal stromal tumor of esophagus: Secondary | ICD-10-CM | POA: Diagnosis not present

## 2017-01-18 NOTE — Progress Notes (Signed)
  [facility] OFFICE PROGRESS NOTE   Diagnosis: Gastrointestinal stromal tumor  INTERVAL HISTORY:   Chelsea Jordan returns as scheduled. She feels well. No dysphagia. No nausea. No new complaint. She and her husband had a 24-hour GI illness with nausea and diarrhea. She reports this was going [facility].  Objective:  Vital signs in last 24 hours:  Blood pressure (!) 138/92, pulse 67, temperature 98.7 F (37.1 C), temperature source Oral, resp. rate 18, height 5\' 3"  (1.6 m), weight 124 lb 8 oz (56.5 kg), SpO2 98 %.    HEENT: Neck without mass Lymphatics: No cervical nodes. "Shotty "scalene/supraclavicular and axillary nodes. Resp: Lungs clear bilaterally Cardio: Regular rate and rhythm GI: No hepatosplenomegaly, no mass, nontender Vascular: No leg edema  Lab Results:  Lab Results  Component Value Date   WBC 8.3 07/15/2016   HGB 14.2 07/15/2016   HCT 42.2 07/15/2016   MCV 90.3 07/15/2016   PLT 201.0 07/15/2016   NEUTROABS 5.9 07/15/2016    CMP     Component Value Date/Time   NA 139 07/15/2016 1108   K 3.9 07/15/2016 1108   CL 102 07/15/2016 1108   CO2 30 07/15/2016 1108   GLUCOSE 96 07/15/2016 1108   BUN 14 07/15/2016 1108   CREATININE 0.50 07/15/2016 1108   CREATININE 0.48 (L) 02/21/2015 1527   CALCIUM 9.8 07/15/2016 1108   PROT 6.7 07/15/2016 1108   ALBUMIN 4.3 07/15/2016 1108   AST 17 07/15/2016 1108   ALT 12 07/15/2016 1108   ALKPHOS 94 07/15/2016 1108   BILITOT 0.7 07/15/2016 1108   GFRNONAA 85 (L) 06/28/2012 1033   GFRAA >90 06/28/2012 1033     Medications: I have reviewed the patient's current medications.  Assessment/Plan: 1. 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 2.6 cm muscularis propria mass at 30 cm from the incisors, 9 mm similar appearing lesion at the GE junction  2. History of pill dysphagia secondary to #1  3. Chronic urinary tract infections  4.  Squamous cell carcinoma of the left lower leg August 2015  Disposition:  There is no clinical evidence for progression of the gastrointestinal stromal tumor. We will schedule a restaging chest CT prior to an office visit in 4 months.  15 minutes were spent with the patient today. The majority of the time was used for counseling and coordination of care.  Betsy Coder, MD  01/18/2017  12:57 PM

## 2017-01-18 NOTE — Telephone Encounter (Signed)
Scheduled appt per 5/15 los. - Gave patient AVS and calender per 5/15. GI - kathy at [facility]  will contact patient with Ct schedule.

## 2017-02-08 ENCOUNTER — Ambulatory Visit: Payer: Self-pay | Admitting: Internal Medicine

## 2017-02-09 ENCOUNTER — Other Ambulatory Visit: Payer: Self-pay | Admitting: Internal Medicine

## 2017-02-09 ENCOUNTER — Encounter: Payer: Self-pay | Admitting: Internal Medicine

## 2017-02-09 ENCOUNTER — Other Ambulatory Visit (INDEPENDENT_AMBULATORY_CARE_PROVIDER_SITE_OTHER): Payer: Medicare Other

## 2017-02-09 ENCOUNTER — Ambulatory Visit (INDEPENDENT_AMBULATORY_CARE_PROVIDER_SITE_OTHER): Payer: Medicare Other | Admitting: Internal Medicine

## 2017-02-09 VITALS — BP 118/84 | HR 72 | Ht 64.0 in | Wt 122.0 lb

## 2017-02-09 DIAGNOSIS — R413 Other amnesia: Secondary | ICD-10-CM | POA: Insufficient documentation

## 2017-02-09 DIAGNOSIS — I10 Essential (primary) hypertension: Secondary | ICD-10-CM

## 2017-02-09 DIAGNOSIS — E785 Hyperlipidemia, unspecified: Secondary | ICD-10-CM | POA: Diagnosis not present

## 2017-02-09 LAB — LIPID PANEL
CHOL/HDL RATIO: 2
CHOLESTEROL: 185 mg/dL (ref 0–200)
HDL: 85.6 mg/dL (ref >39.00)
LDL CALC: 79 mg/dL (ref 0–99)
NonHDL: 99.67
TRIGLYCERIDES: 104 mg/dL (ref 0.0–149.0)
VLDL: 20.8 mg/dL (ref 0.0–40.0)

## 2017-02-09 LAB — CBC WITH DIFFERENTIAL/PLATELET
Basophils Absolute: 0 10*3/uL (ref 0.0–0.1)
Basophils Relative: 0.5 % (ref 0.0–3.0)
EOS PCT: 3.3 % (ref 0.0–5.0)
Eosinophils Absolute: 0.2 10*3/uL (ref 0.0–0.7)
HEMATOCRIT: 41.9 % (ref 36.0–46.0)
HEMOGLOBIN: 13.9 g/dL (ref 12.0–15.0)
LYMPHS PCT: 25.9 % (ref 12.0–46.0)
Lymphs Abs: 1.9 10*3/uL (ref 0.7–4.0)
MCHC: 33 g/dL (ref 30.0–36.0)
MCV: 91.5 fl (ref 78.0–100.0)
MONO ABS: 0.8 10*3/uL (ref 0.1–1.0)
MONOS PCT: 11.4 % (ref 3.0–12.0)
Neutro Abs: 4.4 10*3/uL (ref 1.4–7.7)
Neutrophils Relative %: 58.9 % (ref 43.0–77.0)
Platelets: 194 10*3/uL (ref 150.0–400.0)
RBC: 4.58 Mil/uL (ref 3.87–5.11)
RDW: 13.1 % (ref 11.5–15.5)
WBC: 7.4 10*3/uL (ref 4.0–10.5)

## 2017-02-09 LAB — URINALYSIS, ROUTINE W REFLEX MICROSCOPIC
Bilirubin Urine: NEGATIVE
Hgb urine dipstick: NEGATIVE
Ketones, ur: 40 — AB
Nitrite: NEGATIVE
Specific Gravity, Urine: 1.015
Total Protein, Urine: NEGATIVE
Urine Glucose: NEGATIVE
Urobilinogen, UA: 1
pH: 7 (ref 5.0–8.0)

## 2017-02-09 LAB — BASIC METABOLIC PANEL
BUN: 16 mg/dL (ref 6–23)
CALCIUM: 9.7 mg/dL (ref 8.4–10.5)
CO2: 30 mEq/L (ref 19–32)
Chloride: 102 mEq/L (ref 96–112)
Creatinine, Ser: 0.55 mg/dL (ref 0.40–1.20)
GFR: 110.87 mL/min (ref >60.00)
GLUCOSE: 98 mg/dL (ref 70–99)
POTASSIUM: 4 meq/L (ref 3.5–5.1)
Sodium: 138 mEq/L (ref 135–145)

## 2017-02-09 LAB — TSH: TSH: 2.41 u[IU]/mL (ref 0.35–4.50)

## 2017-02-09 LAB — HEPATIC FUNCTION PANEL
ALT: 14 U/L (ref 0–35)
AST: 18 U/L (ref 0–37)
Albumin: 4.2 g/dL (ref 3.5–5.2)
Alkaline Phosphatase: 82 U/L (ref 39–117)
BILIRUBIN TOTAL: 0.5 mg/dL (ref 0.2–1.2)
Bilirubin, Direct: 0.1 mg/dL (ref 0.0–0.3)
Total Protein: 6.5 g/dL (ref 6.0–8.3)

## 2017-02-09 LAB — VITAMIN B12: Vitamin B-12: 570 pg/mL (ref 211–911)

## 2017-02-09 MED ORDER — CEPHALEXIN 500 MG PO CAPS: 500.0000 mg | ORAL_CAPSULE | Freq: Three times a day (TID) | ORAL | 0 refills | Status: AC

## 2017-02-09 NOTE — Patient Instructions (Signed)
Please continue all other medications as before, and refills have been done if requested.  Please have the pharmacy call with any other refills you may need.  Please continue your efforts at being more active, low cholesterol diet, and weight control.  You are otherwise up to date with prevention measures today.  Please keep your appointments with your specialists as you may have planned  You will be contacted regarding the referral for: MRI, and Neurology  Please go to the LAB in the Basement (turn left off the elevator) for the tests to be done today  You will be contacted by phone if any changes need to be made immediately.  Otherwise, you will receive a letter about your results with an explanation, but please check with MyChart first.  Please remember to sign up for MyChart if you have not done so, as this will be important to you in the future with finding out test results, communicating by private email, and scheduling acute appointments online when needed.  If you have Medicare related insurance (such as traditional Medicare, [employer] or [employer], or similar), Please make an appointment at the [facility] with Sharee Pimple, the [employer], for your Wellness Visit in this office, which is a benefit with your insurance.  Please return in 6 months, or sooner if needed

## 2017-02-09 NOTE — Progress Notes (Signed)
Subjective:    Patient ID: Chelsea Jordan, female    DOB: 09-10-1926, 83 y.o.   MRN: [id number]  HPI  Here with husband who provides the relevant hx; pt loudly proclaims as I enter "this will only take a minute."  Patient  lives at [facility] assisted living with husband, who has notice memory loss much worse in last few months, patient is no longer driving as she has gotten lost; husband also with some mild impairment he admits, but she is now worse than hi.  She has primarily poor ST memory recall, long term memory seems intact, though she does tend to continue to take her meds well.  Pt denies new neurological symptoms such as new headache, or facial or extremity weakness or numbness   Pt denies polydipsia, polyuria,  Pt denies fever, wt loss, night sweats, loss of appetite, or other constitutional symptoms Past Medical History:  Diagnosis Date  . Anxiety   . Benign fundic gland polyps of stomach   . Benign gastrointestinal stromal tumor (GIST)   . Bronchitis   . Cancer ([place])    recent skin cancer left leg  excised about 8 days ago-remains with dressing intact.   . Chronic cystitis   . COLONIC POLYPS, HX OF   . Complication of anesthesia   . Diverticulosis   . DYSLIPIDEMIA   . Gastritis   . GERD (gastroesophageal reflux disease)   . Hemorrhoids   . Hiatal hernia   . HYPERTENSION   . Macular degeneration    optho q30mo - Hecker  . Meningioma ([place])   . MITRAL VALVE PROLAPSE   . NEPHROLITHIASIS   . OSTEOARTHRITIS   . OSTEOPOROSIS   . PONV (postoperative nausea and vomiting)   . Thoracic aortic aneurysm (HCC)    4.1 cm   Past Surgical History:  Procedure Laterality Date  . ABDOMINAL HYSTERECTOMY    . APPENDECTOMY    . BREAST SURGERY    . Cataract surgery  2000  . CHOLECYSTECTOMY    . COLONOSCOPY    . ESOPHAGOGASTRODUODENOSCOPY    . EUS N/A 05/16/2014   Procedure: UPPER ENDOSCOPIC ULTRASOUND (EUS) LINEAR;  Surgeon: Milus Banister, MD;  Location: [facility];  Service:  Endoscopy;  Laterality: N/A;  . INSERTION OF MESH  07/04/2012   Procedure: INSERTION OF MESH;  Surgeon: Odis Hollingshead, MD;  Location: [place];  Service: General;  Laterality: N/A;  . KNEE SURGERY    . LEG SKIN LESION  BIOPSY / EXCISION Left    8 days ago pending pathology, remains with dressing.  . SURGERY OF LIP  in 2002  . TONSILLECTOMY    . VENTRAL HERNIA REPAIR  07/04/2012   Procedure: HERNIA REPAIR VENTRAL ADULT;  Surgeon: Odis Hollingshead, MD;  Location: [facility];  Service: General;  Laterality: N/A;    reports that she has never smoked. She has never used smokeless tobacco. She reports that she drinks alcohol. She reports that she does not use drugs. family history includes Breast cancer in her sister; Emphysema in her father; Heart disease in her father, mother, and other; Lung cancer in her sister; Prostate cancer in her brother and son. Allergies  Allergen Reactions  . Pentothal [Thiopental] Nausea And Vomiting  . Codeine   . Levofloxacin   . Oxycodone-Acetaminophen   . Sulfonamide Derivatives     REACTION: GI upset/nausea/vomiting  . Tape Rash    Per patient adhesive tape   Current Outpatient Prescriptions on File Prior  to Visit  Medication Sig Dispense Refill  . Ascorbic Acid (VITAMIN C) 500 MG tablet Take 500 mg by mouth daily.      Marland Kitchen aspirin 81 MG tablet Take 81 mg by mouth at bedtime.     . calcium-vitamin D (CALCIUM 500+D) 500-200 MG-UNIT per tablet Take 1 tablet by mouth daily.      . Cholecalciferol (VITAMIN D3) 2000 UNITS TABS Take 2,000 Units by mouth daily.      Marland Kitchen esomeprazole (NEXIUM) 40 MG capsule Take 30- 60 min before your first and last meals of the day 180 capsule 4  . losartan (COZAAR) 50 MG tablet TAKE 1 TABLET BY MOUTH EVERY MORNING 90 tablet 3  . Lutein-Zeaxanthin 20-1 MG CAPS Take 1 capsule by mouth daily.    . nitrofurantoin (MACRODANTIN) 100 MG capsule Take 100 mg by mouth at bedtime.    . simvastatin (ZOCOR) 5 MG tablet Take 1 tablet (5 mg total)  by mouth at bedtime. Overdue for yearly physical w/labs must see MD for refills 90 tablet 3  . vitamin E 400 UNIT capsule Take 400 Units by mouth every morning.     No current facility-administered medications on file prior to visit.    Review of Systems  Constitutional: Negative for other unusual diaphoresis or sweats HENT: Negative for ear discharge or swelling Eyes: Negative for other worsening visual disturbances Respiratory: Negative for stridor or other swelling  Gastrointestinal: Negative for worsening distension or other blood Genitourinary: Negative for retention or other urinary change Musculoskeletal: Negative for other MSK pain or swelling Skin: Negative for color change or other new lesions Neurological: Negative for worsening tremors and other numbness  Psychiatric/Behavioral: Negative for worsening agitation or other fatigue All other system neg per pt and husband    Objective:   Physical Exam BP 118/84   Pulse 72   Ht 5\' 4"  (1.626 m)   Wt 122 lb (55.3 kg)   SpO2 98%   BMI 20.94 kg/m  VS noted, not ill appearing Constitutional: Pt appears in NAD HENT: Head: NCAT.  Right Ear: External ear normal.  Left Ear: External ear normal.  Eyes: . Pupils are equal, round, and reactive to light. Conjunctivae and EOM are normal Nose: without d/c or deformity Neck: Neck supple. Gross normal ROM Cardiovascular: Normal rate and regular rhythm.   Pulmonary/Chest: Effort normal and breath sounds without rales or wheezing.  Abd:  Soft, NT, ND, + BS, no organomegaly Neurological: Pt is alert. Oriented to name and doctor office only, motor grossly intact, gait intact, memory with obvious ST dysfxn Skin: Skin is warm. No rashes, other new lesions, no LE edema Psychiatric: Pt behavior is normal without agitation , mild nervous        Assessment & Plan:

## 2017-02-10 ENCOUNTER — Telehealth: Payer: Self-pay

## 2017-02-10 NOTE — Telephone Encounter (Signed)
-----   Message from Biagio Borg, MD sent at 02/09/2017  7:37 PM EDT ----- Left message on MyChart, pt to cont same tx except  The test results show that your current treatment is OK, except the urine test is consistent with possible infection.  We will send an antibiotic to your pharmacy, and you should be called from the office as well.Redmond Baseman to please inform pt, I will do rx

## 2017-02-10 NOTE — Telephone Encounter (Signed)
Pt has been notified and expressed understanding.

## 2017-02-12 NOTE — Assessment & Plan Note (Addendum)
Pt hx c/w dementia likely mild to mod, for MRI, neurology referral, B12 and lab as ordered today,  to f/u any worsening symptoms or concerns  Note:  Total time for pt hx, exam, review of record with pt in the room, determination of diagnoses and plan for further eval and tx is > 40 min, with over 50% spent in coordination and counseling of patient and husband regarding the differential dx, eval and tx of memory dysfxn, HTn and HLD

## 2017-02-12 NOTE — Assessment & Plan Note (Signed)
stable overall by history and exam, recent data reviewed with pt, and pt to continue medical treatment as before,  to f/u any worsening symptoms or concerns Lab Results  Component Value Date   LDLCALC 79 02/09/2017

## 2017-02-12 NOTE — Assessment & Plan Note (Signed)
stable overall by history and exam, recent data reviewed with pt, and pt to continue medical treatment as before,  to f/u any worsening symptoms or concerns BP Readings from Last 3 Encounters:  02/09/17 118/84  01/18/17 (!) 138/92  12/25/16 126/76

## 2017-02-19 ENCOUNTER — Other Ambulatory Visit: Payer: Self-pay | Admitting: Cardiology

## 2017-02-21 ENCOUNTER — Telehealth: Payer: Self-pay | Admitting: Cardiology

## 2017-02-21 NOTE — Telephone Encounter (Signed)
Spoke with pt she states that she needs to schedule yearly appt with Dr Percival Spanish she states that she has not been call to schedule. sched appt with Suanne Marker 03-01-17 for annual appt

## 2017-02-21 NOTE — Telephone Encounter (Signed)
Rx(s) sent to pharmacy electronically.  

## 2017-02-21 NOTE — Telephone Encounter (Signed)
New Message  Pt call requesting to get a sooner appt than next available with only Dr. Percival Spanish. Please call back to discuss

## 2017-02-25 ENCOUNTER — Ambulatory Visit (INDEPENDENT_AMBULATORY_CARE_PROVIDER_SITE_OTHER): Payer: Medicare Other | Admitting: Neurology

## 2017-02-25 ENCOUNTER — Encounter: Payer: Self-pay | Admitting: Neurology

## 2017-02-25 VITALS — BP 136/72 | HR 76 | Ht 64.5 in | Wt 125.0 lb

## 2017-02-25 DIAGNOSIS — R413 Other amnesia: Secondary | ICD-10-CM | POA: Diagnosis not present

## 2017-02-25 DIAGNOSIS — G3184 Mild cognitive impairment, so stated: Secondary | ICD-10-CM

## 2017-02-25 NOTE — Patient Instructions (Signed)
Although you may have some memory loss, you do not have dementia/Alzheimer's at this time.  Therefore, the memory medication is not typically indicated.  If your memory gets worse, please follow up for re-evaluation.

## 2017-02-25 NOTE — Progress Notes (Signed)
NEUROLOGY FOLLOW UP OFFICE NOTE  Chelsea Jordan [id number]  HISTORY OF PRESENT ILLNESS: Chelsea Jordan is an 83 year old right-handed woman with hypertension, MVP, dyslipidemia, osteoarthritis, macular degeneration and gastrointestinal stromal tumor whom I previously saw for dizziness returns for evaluation of memory deficits.  History supplemented by PCP note.  Over the past 6 months, she reports short term memory problems.  She has gotten lost so she no longer drives.  She typically goes out with her twin sister, who usually picks her up around the same time.   Recently, she can't remember the time and frequently asks her sister when she is picking her up.  Other than that, she denies word-finding difficulty, misplacing objects, forgetting names or faces of familiar people, difficulty performing familiar daily tasks.  She pays bills correctly.  She remembers appointments.  She denies headache, double vision, and nausea.  She still has mild chronic dizziness.  She denies depression.  Labs from 02/09/17 include:  B12 570; TSH 2.41; CBC with WBC 7.4, HGB 13.9, HCT 41.9, PLT 194; CMP with Na 138, K 4, Cl 102, CO2 30, glucose 98, BUN 16, Cr 0.55, total bili 0.5, ALP 82, AST 18, and ALT 14.  UA was positive for infection with positive leukocytes and WBCs.  MRI of brain from 03/04/15:  1.6 x 1.1 1.1 cm calcified left frontal meningioma without significant mass effect or vasogenic edema.  Mild to moderate chronic small vessel ischemic changes with diffuse atrophy.  PAST MEDICAL HISTORY: Past Medical History:  Diagnosis Date  . Anxiety   . Benign fundic gland polyps of stomach   . Benign gastrointestinal stromal tumor (GIST)   . Bronchitis   . Cancer ([place])    recent skin cancer left leg  excised about 8 days ago-remains with dressing intact.   . Chronic cystitis   . COLONIC POLYPS, HX OF   . Complication of anesthesia   . Diverticulosis   . DYSLIPIDEMIA   . Gastritis   . GERD (gastroesophageal  reflux disease)   . Hemorrhoids   . Hiatal hernia   . HYPERTENSION   . Macular degeneration    optho q35mo - Hecker  . Meningioma ([place])   . MITRAL VALVE PROLAPSE   . NEPHROLITHIASIS   . OSTEOARTHRITIS   . OSTEOPOROSIS   . PONV (postoperative nausea and vomiting)   . Thoracic aortic aneurysm (HCC)    4.1 cm    MEDICATIONS: Current Outpatient Prescriptions on File Prior to Visit  Medication Sig Dispense Refill  . Ascorbic Acid (VITAMIN C) 500 MG tablet Take 500 mg by mouth daily.      Marland Kitchen aspirin 81 MG tablet Take 81 mg by mouth at bedtime.     . calcium-vitamin D (CALCIUM 500+D) 500-200 MG-UNIT per tablet Take 1 tablet by mouth daily.      . Cholecalciferol (VITAMIN D3) 2000 UNITS TABS Take 2,000 Units by mouth daily.      Marland Kitchen esomeprazole (NEXIUM) 40 MG capsule Take 30- 60 min before your first and last meals of the day 180 capsule 4  . losartan (COZAAR) 50 MG tablet TAKE 1 TABLET BY MOUTH EVERY MORNING 90 tablet 1  . Lutein-Zeaxanthin 20-1 MG CAPS Take 1 capsule by mouth daily.    . nitrofurantoin (MACRODANTIN) 100 MG capsule Take 100 mg by mouth at bedtime.    . simvastatin (ZOCOR) 5 MG tablet Take 1 tablet (5 mg total) by mouth at bedtime. Overdue for yearly physical w/labs must see  MD for refills 90 tablet 3  . vitamin E 400 UNIT capsule Take 400 Units by mouth every morning.     No current facility-administered medications on file prior to visit.     ALLERGIES: Allergies  Allergen Reactions  . Pentothal [Thiopental] Nausea And Vomiting  . Codeine   . Levofloxacin   . Oxycodone-Acetaminophen   . Sulfonamide Derivatives     REACTION: GI upset/nausea/vomiting  . Tape Rash    Per patient adhesive tape    FAMILY HISTORY: Family History  Problem Relation Age of Onset  . Heart disease Other   . Heart disease Mother   . Heart disease Father   . Emphysema Father   . Breast cancer Sister   . Lung cancer Sister   . Prostate cancer Son   . Prostate cancer Brother      SOCIAL HISTORY: Social History   Social History  . Marital status: Married    Spouse name: N/A  . Number of children: 2  . Years of education: N/A   Occupational History  . retired    Social History Main Topics  . Smoking status: Never Smoker  . Smokeless tobacco: Never Used     Comment: Married, lives with spouse, Enjoys golf. Retired  . Alcohol use 0.0 oz/week     Comment: Wine -nightly  . Drug use: No  . Sexual activity: Not Currently    Birth control/ protection:    Other Topics Concern  . Not on file   Social History Narrative   Married, lives with spouse - enjoys golf    REVIEW OF SYSTEMS: Constitutional: No fevers, chills, or sweats, no generalized fatigue, change in appetite Eyes: No visual changes, double vision, eye pain Ear, nose and throat: No hearing loss, ear pain, nasal congestion, sore throat Cardiovascular: No chest pain, palpitations Respiratory:  No shortness of breath at rest or with exertion, wheezes GastrointestinaI: No nausea, vomiting, diarrhea, abdominal pain, fecal incontinence Genitourinary:  No dysuria, urinary retention or frequency Musculoskeletal:  No neck pain, back pain Integumentary: No rash, pruritus, skin lesions Neurological: as above Psychiatric: No depression, insomnia, anxiety Endocrine: No palpitations, fatigue, diaphoresis, mood swings, change in appetite, change in weight, increased thirst Hematologic/Lymphatic:  No purpura, petechiae. Allergic/Immunologic: no itchy/runny eyes, nasal congestion, recent allergic reactions, rashes  PHYSICAL EXAM: Vitals:   02/25/17 1308  BP: 136/72  Pulse: 76   General: No acute distress.  Patient appears well-groomed.  normal body habitus. Head:  Normocephalic/atraumatic Eyes:  Fundi examined but not visualized Neck: supple, no paraspinal tenderness, full range of motion Heart:  Regular rate and rhythm Lungs:  Clear to auscultation bilaterally Back: No paraspinal  tenderness Neurological Exam: alert and oriented to person, place, and time. Attention span and concentration intact (however she reversed the O and R when spelling WORLD backward), recent and remote memory intact, fund of knowledge intact.  Speech fluent and not dysarthric, language intact.   MMSE - Mini Mental State Exam 02/25/2017  Orientation to time 4  Orientation to Place 5  Registration 3  Attention/ Calculation 3  Recall 3  Language- name 2 objects 2  Language- repeat 1  Language- follow 3 step command 3  Language- read & follow direction 1  Write a sentence 1  Copy design 1  Total score 27   CN II-XII intact. Bulk and tone normal, muscle strength 5/5 throughout.  Sensation to light touch, temperature and vibration intact.  Deep tendon reflexes 2+ throughout, toes downgoing.  Finger  to nose and heel to shin testing intact.  Gait normal, Romberg negative.  IMPRESSION: Short-term memory loss.  She may have mild cognitive impairment of the amnestic type, but she does not appear to have dementia.  PLAN: 1.  At this point, I would hold off on starting Aricept, as cholinesterase inhibitors do not delay the onset of Alzheimer's disease in patients with mild cognitive impairment.  Side effects may outweigh benefits at this stage.  I would monitor for now. 2.  Control of stroke risk factors are important:  Blood pressure, hyperlipidemia, ASA therapy. 3.  I recommended following up in 6 months for re-evaluation.  She declined and said she will make an appointment if she feels her memory is getting worse.  26 minutes spent face to face with patient, over 50% spent discussing symptoms, my impression and recommendations.  Metta Clines, DO  CC:  Cathlean Cower, MD

## 2017-03-01 ENCOUNTER — Encounter: Payer: Self-pay | Admitting: Physician Assistant

## 2017-03-01 ENCOUNTER — Ambulatory Visit (INDEPENDENT_AMBULATORY_CARE_PROVIDER_SITE_OTHER): Payer: Medicare Other | Admitting: Physician Assistant

## 2017-03-01 ENCOUNTER — Ambulatory Visit: Payer: Self-pay | Admitting: Physician Assistant

## 2017-03-01 ENCOUNTER — Telehealth: Payer: Self-pay | Admitting: Internal Medicine

## 2017-03-01 VITALS — BP 136/84 | HR 64 | Ht 64.5 in | Wt 123.8 lb

## 2017-03-01 DIAGNOSIS — I10 Essential (primary) hypertension: Secondary | ICD-10-CM | POA: Diagnosis not present

## 2017-03-01 DIAGNOSIS — I341 Nonrheumatic mitral (valve) prolapse: Secondary | ICD-10-CM | POA: Diagnosis not present

## 2017-03-01 DIAGNOSIS — I712 Thoracic aortic aneurysm, without rupture: Secondary | ICD-10-CM

## 2017-03-01 DIAGNOSIS — R079 Chest pain, unspecified: Secondary | ICD-10-CM

## 2017-03-01 DIAGNOSIS — I7121 Aneurysm of the ascending aorta, without rupture: Secondary | ICD-10-CM

## 2017-03-01 NOTE — Progress Notes (Signed)
Cardiology Office Note   Date:  03/01/2017   ID:  Chelsea Jordan, DOB Mar 28, 1927, MRN [id number]  PCP:  Biagio Borg, MD  Cardiologist:  Dr. Percival Spanish, 01/05/2016  Rosaria Ferries, PA-C    History of Present Illness: Chelsea Jordan is a 83 y.o. female with a history of GIST, 4.1 cm ascending thoracic aneurysm on CT 2015 (4.2 cm on echo 2017), HTN, HLD, GERD, mild AS & MVP.  Chelsea Jordan presents for cardiology followup  In general, she does quite well. She is not having any problems swallowing, follows with her oncologist regularly.   She gets chest pain occasionally, she gets it most often when she lays down, had an episode while standing in line. She does not get it with exertion.   Since she fell and broke her L arm, R arm injury also, she has not played golf. She has been cleared for putting and chip shots. She walks with her husband, doesn't time it but feels she gets exercise.   Her sister has a PPM and she has multiple family members with CAD. She is concerned that she might need either a stent or a pacemaker. However, she is most concerned that she needs to get her aorta fixed.  She does not get chest pain with exertion. No LE edema, no orthopnea or PND. Denies DOE. No presyncope or syncope. She does not get palpitations.   Past Medical History:  Diagnosis Date  . Anxiety   . Benign fundic gland polyps of stomach   . Benign gastrointestinal stromal tumor (GIST)   . Bronchitis   . Cancer ([place])    recent skin cancer left leg  excised about 8 days ago-remains with dressing intact.   . Chronic cystitis   . COLONIC POLYPS, HX OF   . Complication of anesthesia   . Diverticulosis   . DYSLIPIDEMIA   . Gastritis   . GERD (gastroesophageal reflux disease)   . Hemorrhoids   . Hiatal hernia   . HYPERTENSION   . Macular degeneration    optho q47mo - Hecker  . Meningioma ([place])   . MITRAL VALVE PROLAPSE   . NEPHROLITHIASIS   . OSTEOARTHRITIS   . OSTEOPOROSIS   . PONV  (postoperative nausea and vomiting)   . Thoracic aortic aneurysm (HCC)    4.1 cm 2015    Past Surgical History:  Procedure Laterality Date  . ABDOMINAL HYSTERECTOMY    . APPENDECTOMY    . BREAST SURGERY    . [place]   no PCI, performed in [place]  . Cataract surgery  2000  . CHOLECYSTECTOMY    . COLONOSCOPY    . ESOPHAGOGASTRODUODENOSCOPY    . EUS N/A 05/16/2014   Procedure: UPPER ENDOSCOPIC ULTRASOUND (EUS) LINEAR;  Surgeon: Milus Banister, MD;  Location: [facility];  Service: Endoscopy;  Laterality: N/A;  . INSERTION OF MESH  07/04/2012   Procedure: INSERTION OF MESH;  Surgeon: Odis Hollingshead, MD;  Location: [facility];  Service: General;  Laterality: N/A;  . KNEE SURGERY    . LEG SKIN LESION  BIOPSY / EXCISION Left    8 days ago pending pathology, remains with dressing.  . SURGERY OF LIP  in 2002  . TONSILLECTOMY    . VENTRAL HERNIA REPAIR  07/04/2012   Procedure: HERNIA REPAIR VENTRAL ADULT;  Surgeon: Odis Hollingshead, MD;  Location: [facility];  Service: General;  Laterality: N/A;    Current Outpatient Prescriptions  Medication Sig Dispense Refill  . amoxicillin (AMOXIL) 500 MG tablet Take 500 mg by mouth 2 (two) times daily.    . Ascorbic Acid (VITAMIN C) 500 MG tablet Take 500 mg by mouth daily.      Marland Kitchen aspirin 81 MG tablet Take 81 mg by mouth at bedtime.     . calcium-vitamin D (CALCIUM 500+D) 500-200 MG-UNIT per tablet Take 1 tablet by mouth daily.      . chlorpheniramine (CHLOR-TRIMETON) 2 MG/5ML syrup Take 2 mg by mouth as needed for allergies.    . Cholecalciferol (VITAMIN D3) 2000 UNITS TABS Take 2,000 Units by mouth daily.      Marland Kitchen esomeprazole (NEXIUM) 40 MG capsule Take 30- 60 min before your first and last meals of the day 180 capsule 4  . losartan (COZAAR) 50 MG tablet TAKE 1 TABLET BY MOUTH EVERY MORNING 90 tablet 1  . Lutein-Zeaxanthin 20-1 MG CAPS Take 1 capsule by mouth daily.    . Multiple Vitamins-Minerals (PRESERVISION AREDS 2 PO) Take 1  tablet by mouth daily.    . nitrofurantoin (MACRODANTIN) 100 MG capsule Take 100 mg by mouth at bedtime.    . simvastatin (ZOCOR) 5 MG tablet Take 1 tablet (5 mg total) by mouth at bedtime. Overdue for yearly physical w/labs must see MD for refills 90 tablet 3  . vitamin E 400 UNIT capsule Take 400 Units by mouth every morning.     No current facility-administered medications for this visit.     Allergies:   Pentothal [thiopental]; Codeine; Levofloxacin; Oxycodone-acetaminophen; Sulfonamide derivatives; and Tape    Social History:  The patient  reports that she has never smoked. She has never used smokeless tobacco. She reports that she drinks alcohol. She reports that she does not use drugs.   Family History:  The patient's family history includes Breast cancer in her sister; Emphysema in her father; Heart disease in her father, mother, and other; Lung cancer in her sister; Prostate cancer in her brother and son.    ROS:  Please see the history of present illness. All other systems are reviewed and negative.    PHYSICAL EXAM: VS:  BP 136/84   Pulse 64   Ht 5' 4.5" (1.638 m)   Wt 123 lb 12.8 oz (56.2 kg)   BMI 20.92 kg/m  , BMI Body mass index is 20.92 kg/m. GEN: Well nourished, well developed, female in no acute distress  HEENT: normal for age  Neck: no JVD, no carotid bruit, no masses Cardiac: RRR; Very soft murmur, no rubs, or gallops Respiratory:  clear to auscultation bilaterally, normal work of breathing GI: soft, nontender, nondistended, + BS MS: no deformity or atrophy; no edema; distal pulses are 2+ in all 4 extremities   Skin: warm and dry, no rash; chronic venous changes Neuro:  Strength and sensation are intact Psych: euthymic mood, full affect   EKG:  EKG is ordered today. The ekg ordered today demonstrates sinus rhythm, no acute ischemic changes Borderline early report  ECHO: 01/13/2016 - Left ventricle: The cavity size was normal. Wall thickness was    normal. Systolic function was normal. The estimated ejection   fraction was in the range of 60% to 65%. Wall motion was normal;   there were no regional wall motion abnormalities. Doppler   parameters are consistent with abnormal left ventricular   relaxation (grade 1 diastolic dysfunction). - Aortic valve: There was mild regurgitation. - Ascending aorta: The ascending aorta was mildly dilated. - Mitral  valve: Mild prolapse, involving the posterior leaflet.   There was mild regurgitation. - Left atrium: The atrium was mildly dilated. - Tricuspid valve: There was moderate regurgitation. - Pericardium, extracardiac: A trivial pericardial effusion was identified. Impressions: - Normal LV systolic function; grade 1 diastolic dysfunction;   calcified aortic valve with mild AI; mild LAE; prolapse of   posterior MV leaflet with mild MR; moderate TR; mildly dilated   ascending aorta and aortic arch (each measuring 4.2 cm); suggest   CTA or MRA to further assess.  Recent Labs: 02/09/2017: ALT 14; BUN 16; Creatinine, Ser 0.55; Hemoglobin 13.9; Platelets 194.0; Potassium 4.0; Sodium 138; TSH 2.41    Lipid Panel    Component Value Date/Time   CHOL 185 02/09/2017 1530   TRIG 104.0 02/09/2017 1530   TRIG 98 06/10/2009   HDL 85.60 02/09/2017 1530   CHOLHDL 2 02/09/2017 1530   VLDL 20.8 02/09/2017 1530   LDLCALC 79 02/09/2017 1530   LDLDIRECT 97.7 06/22/2013 0854     Wt Readings from Last 3 Encounters:  03/01/17 123 lb 12.8 oz (56.2 kg)  02/25/17 125 lb (56.7 kg)  02/09/17 122 lb (55.3 kg)     Other studies Reviewed: Additional studies/ records that were reviewed today include: Office notes and testing.  ASSESSMENT AND PLAN:  1.  Mitral valve prolapse: We will recheck her echocardiogram, no acute symptoms  2. Ascending thoracic aortic aneurysm, 4.2 cm and 2017. Recheck echocardiogram  3. Chest pain: Not exertional, no significant abnormalities on EKG her physical exam. Follow for  symptoms. Continue aspirin and statin  4. Hypertension: Her blood pressure is under good control today, no med changes.   Current medicines are reviewed at length with the patient today.  The patient does not have concerns regarding medicines.  The following changes have been made:  no change  Labs/ tests ordered today include:   Orders Placed This Encounter  Procedures  . EKG 12-Lead  . ECHOCARDIOGRAM COMPLETE     Disposition:   FU with Dr. Percival Spanish  Signed, Rosaria Ferries, PA-C  03/01/2017 3:20 PM    [place] Phone: [phone]; Fax: [phone]  This note was written with the assistance of speech recognition software. Please excuse any transcriptional errors.

## 2017-03-01 NOTE — Telephone Encounter (Signed)
You put in an order for pt to have MRI Brain. Pt wanted to wait till after she seen Dr. Tomi Likens. I called pt to see about getting her scheduled and she states she does not want the MRI now since Dr. Tomi Likens had cleared her of dementia.  Please let me know if you still want her to have this done.

## 2017-03-01 NOTE — Patient Instructions (Signed)
Medication Instructions:  Your physician recommends that you continue on your current medications as directed. Please refer to the Current Medication list given to you today. If you need a refill on your cardiac medications before your next appointment, please call your pharmacy.  Testing/Procedures: Your physician has requested that you have an echocardiogram. Echocardiography is a painless test that uses sound waves to create images of your heart. It provides your doctor with information about the size and shape of your heart and how well your heart's chambers and valves are working. This procedure takes approximately one hour. There are no restrictions for this procedure.  Follow-Up: Your physician wants you to follow-up in: Opp will receive a reminder letter in the mail two months in advance. If you don't receive a letter, please call our office MIDDLE OF APRIL 2019 to schedule the June 2019 follow-up appointment.  Thank you for choosing [facility]!!

## 2017-03-16 ENCOUNTER — Ambulatory Visit (HOSPITAL_COMMUNITY): Payer: Medicare Other | Attending: Cardiovascular Disease

## 2017-03-16 ENCOUNTER — Other Ambulatory Visit: Payer: Self-pay

## 2017-03-16 DIAGNOSIS — I712 Thoracic aortic aneurysm, without rupture: Secondary | ICD-10-CM | POA: Diagnosis present

## 2017-03-16 DIAGNOSIS — I083 Combined rheumatic disorders of mitral, aortic and tricuspid valves: Secondary | ICD-10-CM | POA: Insufficient documentation

## 2017-03-16 DIAGNOSIS — E785 Hyperlipidemia, unspecified: Secondary | ICD-10-CM | POA: Insufficient documentation

## 2017-03-16 DIAGNOSIS — I10 Essential (primary) hypertension: Secondary | ICD-10-CM | POA: Insufficient documentation

## 2017-03-16 DIAGNOSIS — I7121 Aneurysm of the ascending aorta, without rupture: Secondary | ICD-10-CM

## 2017-03-18 ENCOUNTER — Other Ambulatory Visit: Payer: Self-pay | Admitting: Internal Medicine

## 2017-05-04 IMAGING — CR DG SHOULDER 2+V*R*
3 series · 3 of 3 positions shown · non-contrast
Comparison: None.

CLINICAL DATA: Fall.  Initial evaluation.

EXAM:
RIGHT SHOULDER - 2+ VIEW

[w shoulder external right]
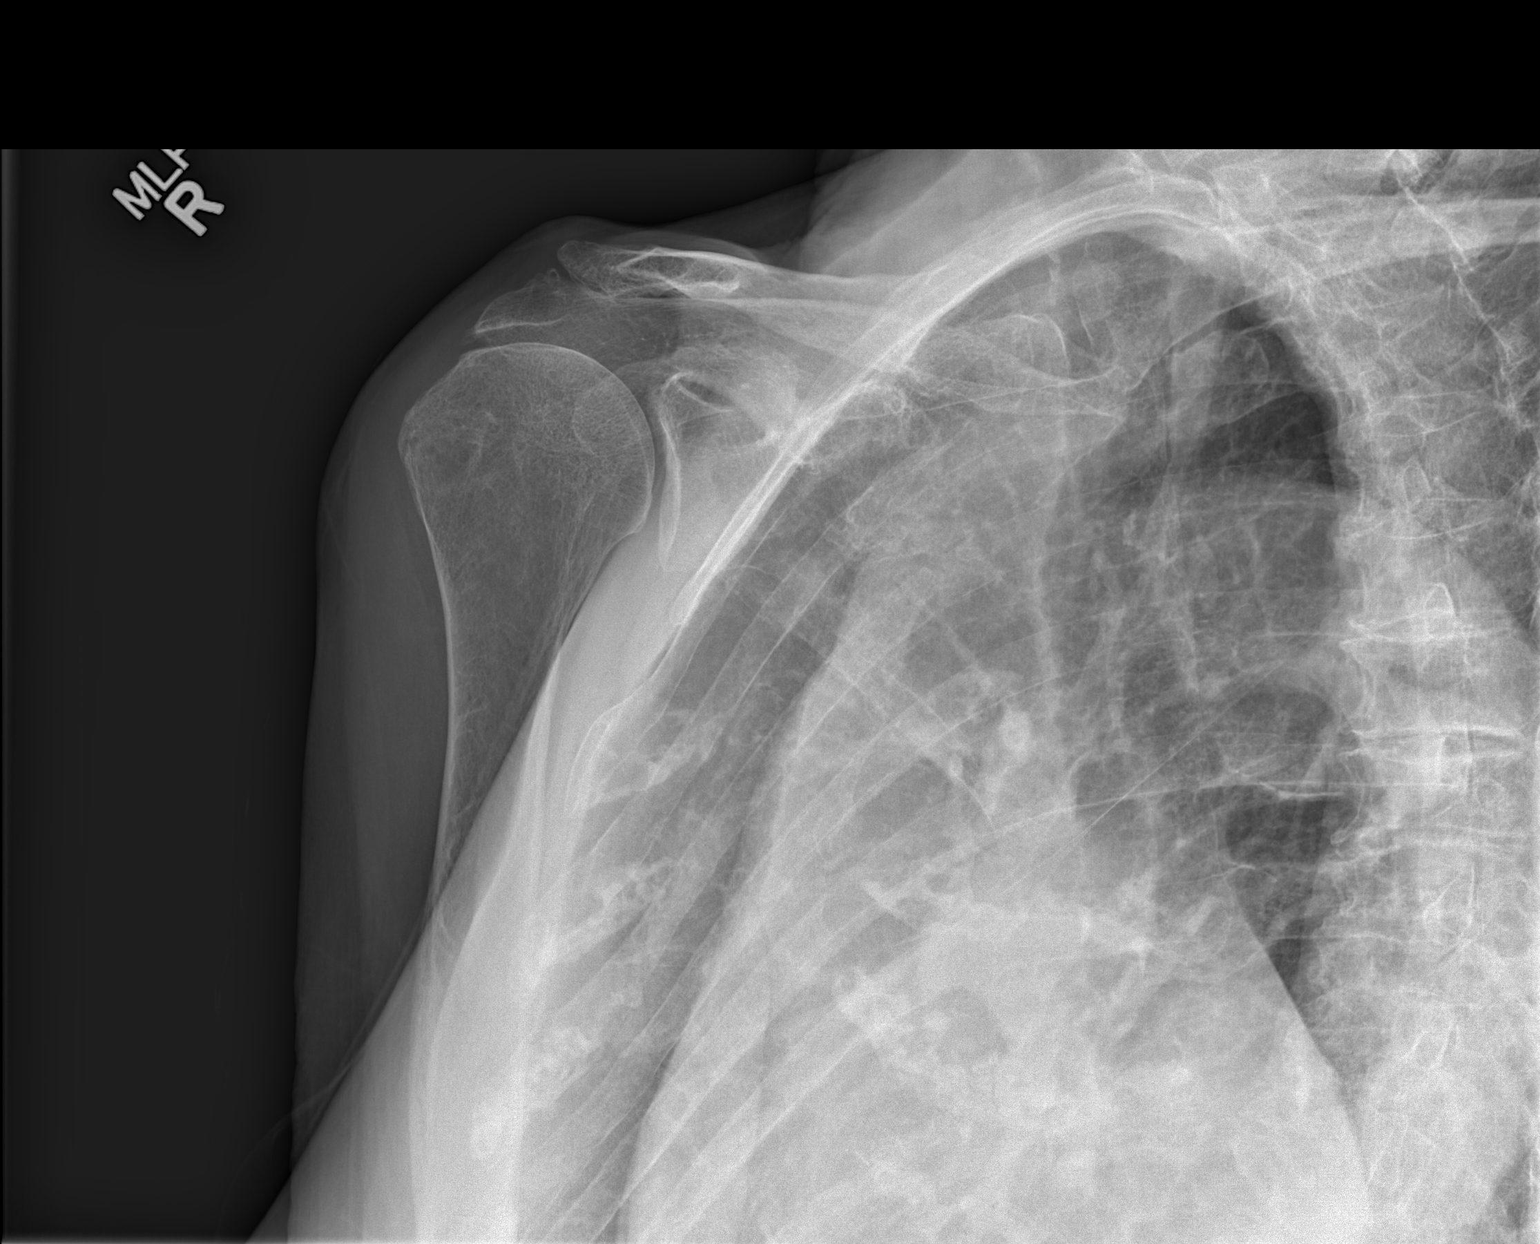

[w shoulder y-view right]
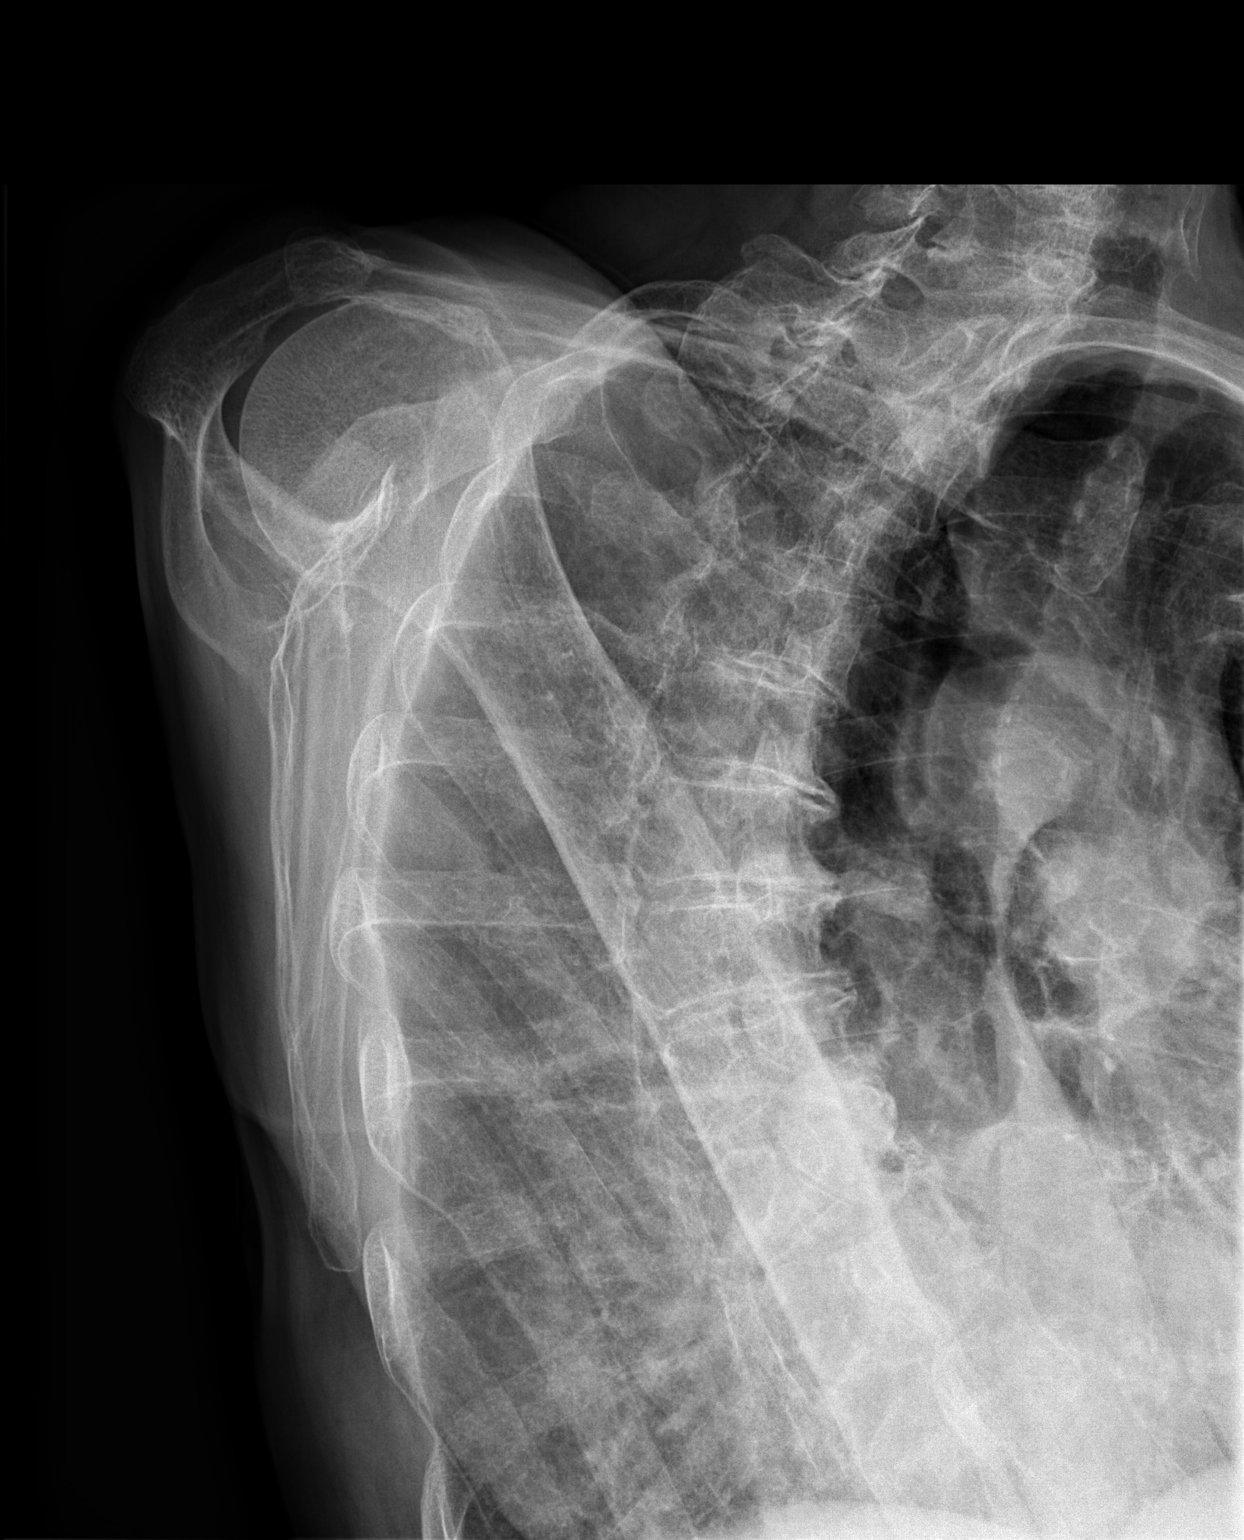

[x shoulder axillary right]
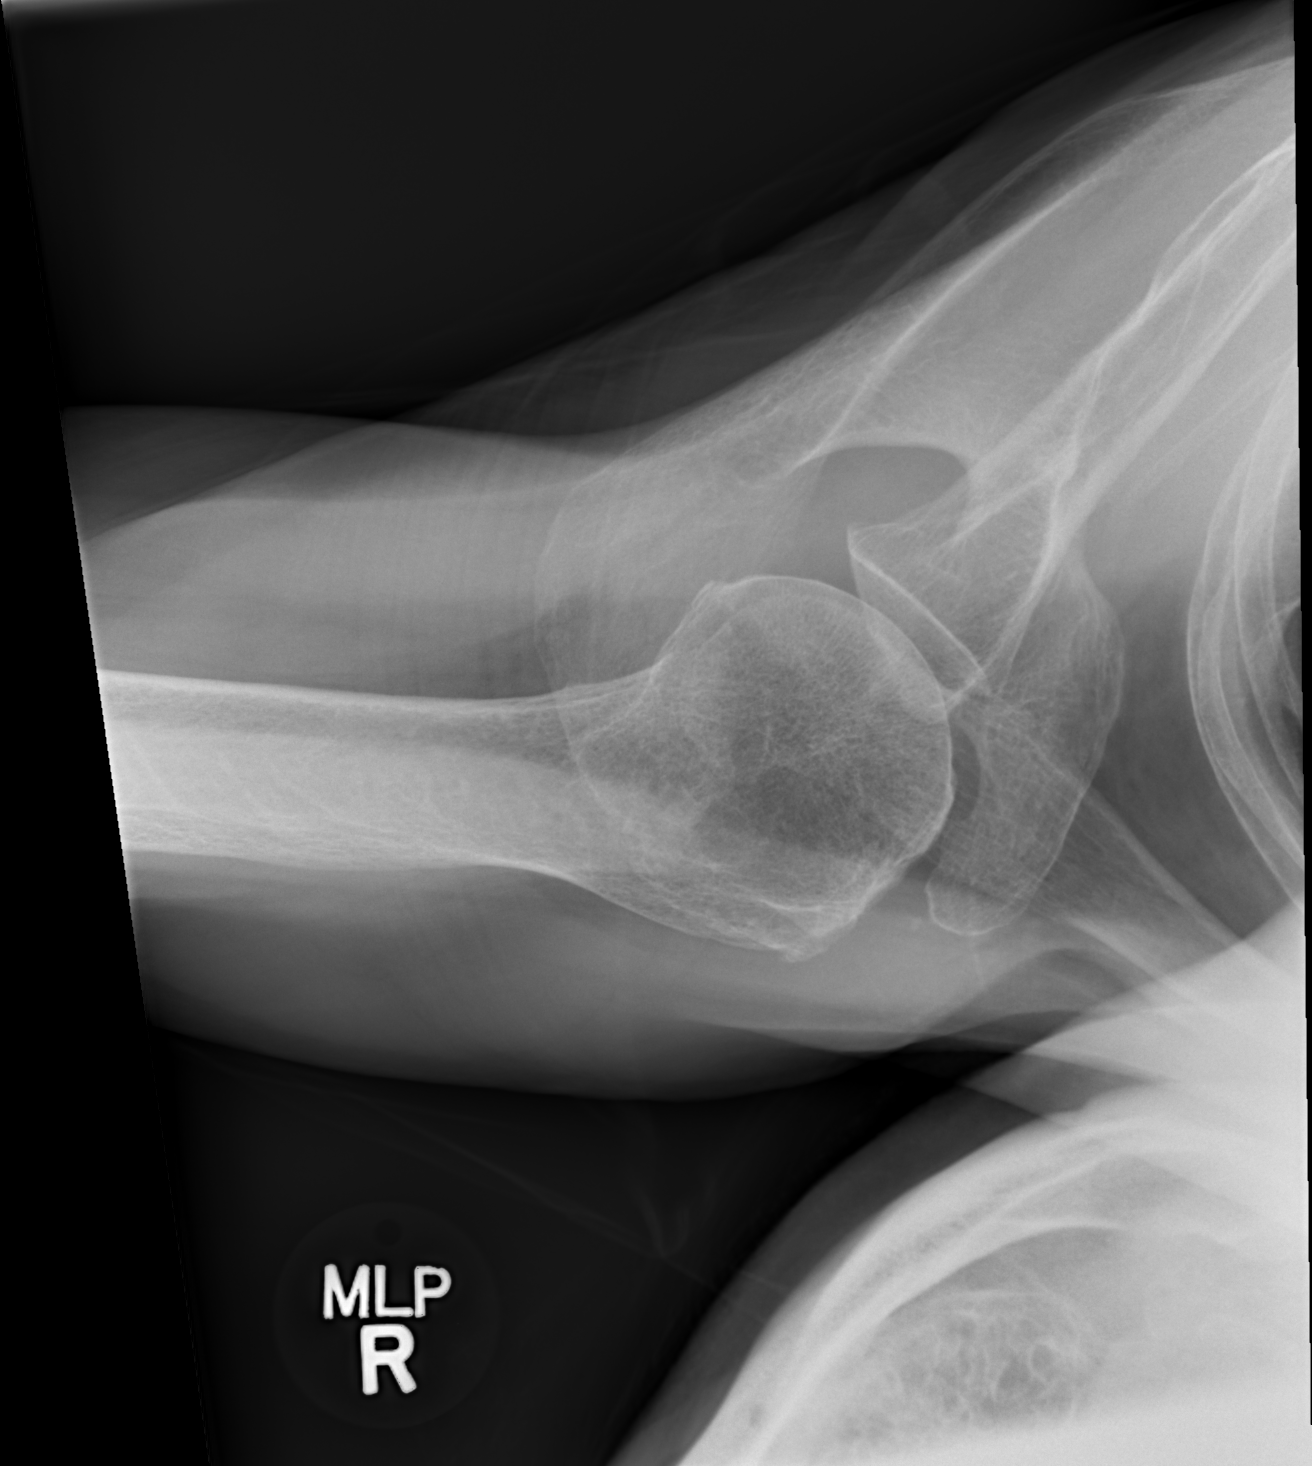

[3 of 3 positions shown; findings below may reference images not displayed]

FINDINGS: Diffuse osteopenia. No acute bony or joint abnormality. No evidence
of fracture or dislocation.
IMPRESSION: No acute abnormality.  Diffuse osteopenia.

## 2017-05-05 NOTE — Progress Notes (Signed)
HPI The patient returns for one year followup.  She has a 4.1 cm thoracic aneurysm on previous CT.   It was 4.38 on the recent echo.  She's been doing relatively well. She's very concerned about her thoracic aneurysm. She is living in a retirement facility. She had a fall there and injured her shoulder. This has limited her somewhat. However, she still walks quite a distance to get mail to the dining hall. She doesn't have any symptoms related to this. She has over the years rarely gotten some chest discomfort but this has been a stable and sporadic pattern. She does not get chest discomfort with activity such as walking to the mailbox. She's not having palpitations, presyncope or syncope. She's had no PND or orthopnea.   Allergies  Allergen Reactions  . Pentothal [Thiopental] Nausea And Vomiting  . Codeine   . Levofloxacin   . Oxycodone-Acetaminophen   . Sulfonamide Derivatives     REACTION: GI upset/nausea/vomiting  . Tape Rash    Per patient adhesive tape    Current Outpatient Prescriptions  Medication Sig Dispense Refill  . amoxicillin (AMOXIL) 500 MG tablet Take 500 mg by mouth 2 (two) times daily.    . Ascorbic Acid (VITAMIN C) 500 MG tablet Take 500 mg by mouth daily.      Marland Kitchen aspirin 81 MG tablet Take 81 mg by mouth at bedtime.     . calcium-vitamin D (CALCIUM 500+D) 500-200 MG-UNIT per tablet Take 1 tablet by mouth daily.      . cephALEXin (KEFLEX) 250 MG capsule Take 250 mg by mouth at bedtime.    . chlorpheniramine (CHLOR-TRIMETON) 2 MG/5ML syrup Take 2 mg by mouth as needed for allergies.    . Cholecalciferol (VITAMIN D3) 2000 UNITS TABS Take 2,000 Units by mouth daily.      Marland Kitchen esomeprazole (NEXIUM) 40 MG capsule TAKE 1 CAPSULE BY MOUTH 30-60 MIN BEFORE YOUR FIRST AND LAST MEAL OF THE DAY 180 capsule 1  . losartan (COZAAR) 50 MG tablet TAKE 1 TABLET BY MOUTH EVERY MORNING 90 tablet 1  . Lutein-Zeaxanthin 20-1 MG CAPS Take 1 capsule by mouth daily.    . Multiple  Vitamins-Minerals (PRESERVISION AREDS 2 PO) Take 1 tablet by mouth daily.    . simvastatin (ZOCOR) 5 MG tablet Take 1 tablet (5 mg total) by mouth at bedtime. Overdue for yearly physical w/labs must see MD for refills 90 tablet 3  . vitamin E 400 UNIT capsule Take 400 Units by mouth every morning.     No current facility-administered medications for this visit.     Past Medical History:  Diagnosis Date  . Anxiety   . Benign fundic gland polyps of stomach   . Benign gastrointestinal stromal tumor (GIST)   . Bronchitis   . Cancer ([place])    recent skin cancer left leg  excised about 8 days ago-remains with dressing intact.   . Chronic cystitis   . COLONIC POLYPS, HX OF   . Complication of anesthesia   . Diverticulosis   . DYSLIPIDEMIA   . Gastritis   . GERD (gastroesophageal reflux disease)   . Hemorrhoids   . Hiatal hernia   . HYPERTENSION   . Macular degeneration    optho q47mo - Hecker  . Meningioma ([place])   . MITRAL VALVE PROLAPSE   . NEPHROLITHIASIS   . OSTEOARTHRITIS   . OSTEOPOROSIS   . PONV (postoperative nausea and vomiting)   . Thoracic aortic aneurysm ([place])  4.1 cm 2015    Past Surgical History:  Procedure Laterality Date  . ABDOMINAL HYSTERECTOMY    . APPENDECTOMY    . BREAST SURGERY    . [place]   no PCI, performed in [place]  . Cataract surgery  2000  . CHOLECYSTECTOMY    . COLONOSCOPY    . ESOPHAGOGASTRODUODENOSCOPY    . EUS N/A 05/16/2014   Procedure: UPPER ENDOSCOPIC ULTRASOUND (EUS) LINEAR;  Surgeon: Milus Banister, MD;  Location[facility];  Service: Endoscopy;  Laterality: N/A;  . INSERTION OF MESH  07/04/2012   Procedure: INSERTION OF MESH;  Surgeon: Odis Hollingshead, MD;  Location: [facility];  Service: General;  Laterality: N/A;  . KNEE SURGERY    . LEG SKIN LESION  BIOPSY / EXCISION Left    8 days ago pending pathology, remains with dressing.  . SURGERY OF LIP  in 2002  . TONSILLECTOMY    . VENTRAL HERNIA REPAIR   07/04/2012   Procedure: HERNIA REPAIR VENTRAL ADULT;  Surgeon: Odis Hollingshead, MD;  Location: [facility];  Service: General;  Laterality: N/A;    ROS:    Positive for fatigue.  Otherwise as stated in the HPI and negative for all other systems.  PHYSICAL EXAM BP 138/84   Pulse 68   Ht 5' 4.5" (1.638 m)   Wt 123 lb (55.8 kg)   BMI 20.79 kg/m   GENERAL:  Well appearing NECK:  No jugular venous distention, waveform within normal limits, carotid upstroke brisk and symmetric, no bruits, no thyromegaly LYMPHATICS:  No cervical, inguinal adenopathy BACK:  Lordosis CHEST:  Unremarkable HEART:  PMI not displaced or sustained,S1 and S2 within normal limits, no S3, no S4, no clicks, no rubs, soft apical systolic murmur, no diastolic ABD:  Flat, positive bowel sounds normal in frequency in pitch, no bruits, no rebound, no guarding, no midline pulsatile mass, no hepatomegaly, no splenomegaly EXT:  2 plus pulses throughout, no edema, no cyanosis no clubbing    ASSESSMENT AND PLAN   AS:    This has been mild on echo in the past.  No further imaging is indicated at this time.   MVP:  This is mild and without significant regurg.  No change in therapy is planned. No further imaging at this time.   HTN:    The blood pressure is at target. No change in medications is indicated. We will continue with therapeutic lifestyle changes (TLC).  THORACIC ANEURYSM:  She is going to have follow up of her GIST tumor with a contrasted CT of the chest in Sept.  I will call them to see if we can have a protocol for this CT that also includes sizing of the thoracic aorta.

## 2017-05-06 ENCOUNTER — Ambulatory Visit (INDEPENDENT_AMBULATORY_CARE_PROVIDER_SITE_OTHER): Payer: Medicare Other | Admitting: Cardiology

## 2017-05-06 ENCOUNTER — Ambulatory Visit: Payer: Medicare Other | Admitting: Cardiology

## 2017-05-06 ENCOUNTER — Encounter: Payer: Self-pay | Admitting: Cardiology

## 2017-05-06 VITALS — BP 138/84 | HR 68 | Ht 64.5 in | Wt 123.0 lb

## 2017-05-06 DIAGNOSIS — I350 Nonrheumatic aortic (valve) stenosis: Secondary | ICD-10-CM | POA: Diagnosis not present

## 2017-05-06 DIAGNOSIS — I10 Essential (primary) hypertension: Secondary | ICD-10-CM

## 2017-05-06 DIAGNOSIS — I712 Thoracic aortic aneurysm, without rupture, unspecified: Secondary | ICD-10-CM

## 2017-05-06 DIAGNOSIS — I341 Nonrheumatic mitral (valve) prolapse: Secondary | ICD-10-CM

## 2017-05-06 NOTE — Patient Instructions (Signed)

## 2017-05-07 ENCOUNTER — Encounter: Payer: Self-pay | Admitting: Cardiology

## 2017-05-11 ENCOUNTER — Telehealth: Payer: Self-pay | Admitting: Cardiology

## 2017-05-11 NOTE — Telephone Encounter (Signed)
Mrs.Garringer is calling because she is wanting to know what is wrong with her heart . Please call

## 2017-05-11 NOTE — Telephone Encounter (Signed)
Returned the call to the patient. No voicemail was set up. Will try to call back.

## 2017-05-13 NOTE — Telephone Encounter (Signed)
Left message to call back , if call not return by the end of day will close encounter.

## 2017-05-13 NOTE — Telephone Encounter (Signed)
Spoke to patient. Explain that Dr hochrein would like to reviewed the size thorcaic aortic aneurysm - will be done when you have ct chest. ( Per Nya, Dr Percival Spanish spoke to [facility])  Patient verbalized understanding.

## 2017-05-16 ENCOUNTER — Ambulatory Visit
Admission: RE | Admit: 2017-05-16 | Discharge: 2017-05-16 | Disposition: A | Discharge status: Home / Self Care | Payer: Medicare Other | Source: Ambulatory Visit | Attending: Oncology | Admitting: Oncology

## 2017-05-16 DIAGNOSIS — C499 Malignant neoplasm of connective and soft tissue, unspecified: Secondary | ICD-10-CM

## 2017-05-23 ENCOUNTER — Ambulatory Visit (HOSPITAL_BASED_OUTPATIENT_CLINIC_OR_DEPARTMENT_OTHER): Payer: Medicare Other | Admitting: Oncology

## 2017-05-23 ENCOUNTER — Telehealth: Payer: Self-pay | Admitting: Oncology

## 2017-05-23 VITALS — BP 156/79 | HR 71 | Temp 97.5°F | Resp 18 | Ht 64.5 in | Wt 123.0 lb

## 2017-05-23 DIAGNOSIS — C49A0 Gastrointestinal stromal tumor, unspecified site: Secondary | ICD-10-CM

## 2017-05-23 DIAGNOSIS — C7652 Malignant neoplasm of left lower limb: Secondary | ICD-10-CM | POA: Diagnosis not present

## 2017-05-23 DIAGNOSIS — R079 Chest pain, unspecified: Secondary | ICD-10-CM

## 2017-05-23 DIAGNOSIS — Z87440 Personal history of urinary (tract) infections: Secondary | ICD-10-CM

## 2017-05-23 DIAGNOSIS — C49A1 Gastrointestinal stromal tumor of esophagus: Secondary | ICD-10-CM | POA: Diagnosis not present

## 2017-05-23 NOTE — Progress Notes (Signed)
  [facility] OFFICE PROGRESS NOTE   Diagnosis: Gastrointestinal stromal tumor of the esophagus  INTERVAL HISTORY:   Chelsea Jordan returns as scheduled. A CT of the chest 05/16/2017, compared 05/03/2014 revealed an unchanged mass at the distal esophagus. There is also a stable 1.1 cm nodule adjacent to the GE junction. No mediastinal or hilar adenopathy. Out increase in the size of a thoracic aneurysm.  She reports feeling well. No dysphagia. She has pain at the upper anterior chest sometimes in the evening. The pain last for 5 minutes. No associated symptoms. Objective:  Vital signs in last 24 hours:  Blood pressure (!) 156/79, pulse 71, temperature (!) 97.5 F (36.4 C), temperature source Oral, resp. rate 18, height 5' 4.5" (1.638 m), weight 123 lb (55.8 kg), SpO2 98 %.    HEENT: Neck without mass Lymphatics: No cervical or supraclavicular nodes Resp: Lungs clear bilaterally Cardio: Regular rate and rhythm GI: No hepatosplenomegaly, no mass, nontender Vascular: No leg edema    Medications: I have reviewed the patient's current medications.  Assessment/Plan: 1. 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 2.6 cm muscularis propria mass at 30 cm from the incisors, 9 mm similar appearing lesion at the GE junction ? CT chest 05/16/2017-no change in the distal esophagus mass or lesion at the GE junction  2. History of pill dysphagia secondary to #1  3. Chronic urinary tract infections  4. Squamous cell carcinoma of the left lower leg August 2015   Disposition:  Chelsea Jordan appears unchanged. The restaging CT reveals no change in the distal esophagus gastrointestinal stromal tumor. I suspect the intermittent chest pain is unrelated to the mass. She may have reflux. She will seek medical attention for persistent pain.  Chelsea Jordan would like to continue follow[facility] at the [facility]. She will return for an  office visit in 9 months. She will obtain an influenza vaccine via Dr. Jenny Reichmann.  15 minutes were spent with the patient today. The majority of the time was used for counseling and coordination of care.  Donneta Romberg, MD  05/23/2017  11:37 AM

## 2017-05-23 NOTE — Telephone Encounter (Signed)
Gave avs and calendar for June 2019  °

## 2017-06-23 ENCOUNTER — Ambulatory Visit (INDEPENDENT_AMBULATORY_CARE_PROVIDER_SITE_OTHER): Payer: Medicare Other | Admitting: Internal Medicine

## 2017-06-23 ENCOUNTER — Encounter: Payer: Self-pay | Admitting: Internal Medicine

## 2017-06-23 VITALS — BP 134/86 | HR 69 | Temp 97.9°F | Ht 64.5 in | Wt 127.0 lb

## 2017-06-23 DIAGNOSIS — Z0000 Encounter for general adult medical examination without abnormal findings: Secondary | ICD-10-CM

## 2017-06-23 DIAGNOSIS — I10 Essential (primary) hypertension: Secondary | ICD-10-CM

## 2017-06-23 DIAGNOSIS — Z23 Encounter for immunization: Secondary | ICD-10-CM | POA: Diagnosis not present

## 2017-06-23 MED ORDER — ZOSTER VAC RECOMB ADJUVANTED 50 MCG/0.5ML IM SUSR: 0.5000 mL | Freq: Once | INTRAMUSCULAR | 1 refills | Status: AC

## 2017-06-23 NOTE — Patient Instructions (Addendum)
You had the flu shot today  Your Shingles shot was sent to the pharmacy  Please continue all other medications as before, and refills have been done if requested.  Please have the pharmacy call with any other refills you may need.  Please continue your efforts at being more active, low cholesterol diet, and weight control.  You are otherwise up to date with prevention measures today.  Please keep your appointments with your specialists as you may have planned  No labs needed today  Please return in 1 year for your yearly visit, or sooner if needed, with Lab testing done 3-5 days before

## 2017-06-23 NOTE — Progress Notes (Signed)
Subjective:    Patient ID: Chelsea Jordan, female    DOB: [birth year], 83 y.o.   MRN: [id number]  HPI  Here for wellness and f/u;  Overall doing ok;  Pt denies Chest pain, worsening SOB, DOE, wheezing, orthopnea, PND, worsening LE edema, palpitations, dizziness or syncope.  Pt denies neurological change such as new headache, facial or extremity weakness.  Pt denies polydipsia, polyuria, or low sugar symptoms. Pt states overall good compliance with treatment and medications, good tolerability, and has been trying to follow appropriate diet.  Pt denies worsening depressive symptoms, suicidal ideation or panic. No fever, night sweats, wt loss, loss of appetite, or other constitutional symptoms.  Pt states good ability with ADL's, has low fall risk, home safety reviewed and adequate, no other significant changes in hearing or vision, and only occasionally active with exercise.  No new complaints. Declines labs, Asks for shingrix Past Medical History:  Diagnosis Date  . Anxiety   . Benign fundic gland polyps of stomach   . Benign gastrointestinal stromal tumor (GIST)   . Bronchitis   . Cancer ([place])    recent skin cancer left leg  excised about 8 days ago-remains with dressing intact.   . Chronic cystitis   . COLONIC POLYPS, HX OF   . Complication of anesthesia   . Diverticulosis   . DYSLIPIDEMIA   . Gastritis   . GERD (gastroesophageal reflux disease)   . Hemorrhoids   . Hiatal hernia   . HYPERTENSION   . Macular degeneration    optho q75mo - Hecker  . Meningioma ([place])   . MITRAL VALVE PROLAPSE   . NEPHROLITHIASIS   . OSTEOARTHRITIS   . OSTEOPOROSIS   . PONV (postoperative nausea and vomiting)   . Thoracic aortic aneurysm (HCC)    4.1 cm 2015   Past Surgical History:  Procedure Laterality Date  . ABDOMINAL HYSTERECTOMY    . APPENDECTOMY    . BREAST SURGERY    . [place]   no PCI, performed in [place]  . Cataract surgery  2000  . CHOLECYSTECTOMY    . COLONOSCOPY     . ESOPHAGOGASTRODUODENOSCOPY    . EUS N/A 05/16/2014   Procedure: UPPER ENDOSCOPIC ULTRASOUND (EUS) LINEAR;  Surgeon: Milus Banister, MD;  Location: [facility];  Service: Endoscopy;  Laterality: N/A;  . INSERTION OF MESH  07/04/2012   Procedure: INSERTION OF MESH;  Surgeon: Odis Hollingshead, MD;  Location: [place];  Service: General;  Laterality: N/A;  . KNEE SURGERY    . LEG SKIN LESION  BIOPSY / EXCISION Left    8 days ago pending pathology, remains with dressing.  . SURGERY OF LIP  in 2002  . TONSILLECTOMY    . VENTRAL HERNIA REPAIR  07/04/2012   Procedure: HERNIA REPAIR VENTRAL ADULT;  Surgeon: Odis Hollingshead, MD;  Location: [place];  Service: General;  Laterality: N/A;    reports that she has never smoked. She has never used smokeless tobacco. She reports that she drinks alcohol. She reports that she does not use drugs. family history includes Breast cancer in her sister; Emphysema in her father; Heart disease in her father, mother, and other; Lung cancer in her sister; Prostate cancer in her brother and son. Allergies  Allergen Reactions  . Pentothal [Thiopental] Nausea And Vomiting  . Codeine   . Levofloxacin   . Oxycodone-Acetaminophen   . Sulfonamide Derivatives     REACTION: GI upset/nausea/vomiting  . Tape Rash  Per patient adhesive tape   Current Outpatient Prescriptions on File Prior to Visit  Medication Sig Dispense Refill  . amoxicillin (AMOXIL) 500 MG tablet Take 2,000 mg by mouth once. Prior to dental visits    . Ascorbic Acid (VITAMIN C) 500 MG tablet Take 500 mg by mouth daily.      Marland Kitchen aspirin 81 MG tablet Take 81 mg by mouth at bedtime.     . calcium-vitamin D (CALCIUM 500+D) 500-200 MG-UNIT per tablet Take 1 tablet by mouth daily.      . Cholecalciferol (VITAMIN D3) 2000 UNITS TABS Take 2,000 Units by mouth daily.      Marland Kitchen esomeprazole (NEXIUM) 40 MG capsule TAKE 1 CAPSULE BY MOUTH 30-60 MIN BEFORE YOUR FIRST AND LAST MEAL OF THE DAY 180 capsule 1  .  losartan (COZAAR) 50 MG tablet TAKE 1 TABLET BY MOUTH EVERY MORNING 90 tablet 1  . Lutein-Zeaxanthin 20-1 MG CAPS Take 1 capsule by mouth daily.    . Multiple Vitamins-Minerals (PRESERVISION AREDS 2 PO) Take 1 tablet by mouth daily.    . simvastatin (ZOCOR) 5 MG tablet Take 1 tablet (5 mg total) by mouth at bedtime. Overdue for yearly physical w/labs must see MD for refills 90 tablet 3  . vitamin E 400 UNIT capsule Take 400 Units by mouth every morning.     No current facility-administered medications on file prior to visit.    Review of Systems Constitutional: Negative for other unusual diaphoresis, sweats, appetite or weight changes HENT: Negative for other worsening hearing loss, ear pain, facial swelling, mouth sores or neck stiffness.   Eyes: Negative for other worsening pain, redness or other visual disturbance.  Respiratory: Negative for other stridor or swelling Cardiovascular: Negative for other palpitations or other chest pain  Gastrointestinal: Negative for worsening diarrhea or loose stools, blood in stool, distention or other pain Genitourinary: Negative for hematuria, flank pain or other change in urine volume.  Musculoskeletal: Negative for myalgias or other joint swelling.  Skin: Negative for other color change, or other wound or worsening drainage.  Neurological: Negative for other syncope or numbness. Hematological: Negative for other adenopathy or swelling Psychiatric/Behavioral: Negative for hallucinations, other worsening agitation, SI, self-injury, or new decreased concentration All other system neg per pt    Objective:   Physical Exam BP 134/86   Pulse 69   Temp 97.9 F (36.6 C) (Oral)   Ht 5' 4.5" (1.638 m)   Wt 127 lb (57.6 kg)   SpO2 97%   BMI 21.46 kg/m  VS noted, not ill appearing Constitutional: Pt appears in NAD HENT: Head: NCAT.  Right Ear: External ear normal.  Left Ear: External ear normal.  Eyes: . Pupils are equal, round, and reactive to  light. Conjunctivae and EOM are normal Nose: without d/c or deformity Neck: Neck supple. Gross normal ROM Cardiovascular: Normal rate and regular rhythm.   Pulmonary/Chest: Effort normal and breath sounds without rales or wheezing.  Abd:  Soft, NT, ND, + BS, no organomegaly Neurological: Pt is alert. At baseline orientation, motor grossly intact Skin: Skin is warm. No rashes, other new lesions, no LE edema Psychiatric: Pt behavior is normal without agitation  No other exam findings Lab Results  Component Value Date   WBC 7.4 02/09/2017   HGB 13.9 02/09/2017   HCT 41.9 02/09/2017   PLT 194.0 02/09/2017   GLUCOSE 98 02/09/2017   CHOL 185 02/09/2017   TRIG 104.0 02/09/2017   HDL 85.60 02/09/2017   LDLDIRECT 97.7  06/22/2013   LDLCALC 79 02/09/2017   ALT 14 02/09/2017   AST 18 02/09/2017   NA 138 02/09/2017   K 4.0 02/09/2017   CL 102 02/09/2017   CREATININE 0.55 02/09/2017   BUN 16 02/09/2017   CO2 30 02/09/2017   TSH 2.41 02/09/2017   INR 0.94 06/28/2012        Assessment & Plan:

## 2017-06-24 ENCOUNTER — Encounter: Payer: Self-pay | Admitting: Cardiology

## 2017-06-25 NOTE — Assessment & Plan Note (Signed)
stable overall by history and exam, recent data reviewed with pt, and pt to continue medical treatment as before,  to f/u any worsening symptoms or concerns BP Readings from Last 3 Encounters:  06/23/17 134/86  05/23/17 (!) 156/79  05/06/17 138/84

## 2017-06-25 NOTE — Assessment & Plan Note (Signed)

## 2017-06-30 ENCOUNTER — Other Ambulatory Visit: Payer: Self-pay | Admitting: *Deleted

## 2017-06-30 NOTE — Progress Notes (Signed)
Opened in error

## 2017-07-08 ENCOUNTER — Telehealth: Payer: Self-pay | Admitting: Internal Medicine

## 2017-07-08 NOTE — Telephone Encounter (Signed)
Patient called stating she got her flu shot on 10/18 with Dr. Jenny Reichmann.  Wants to make sure this is documented.

## 2017-07-11 ENCOUNTER — Ambulatory Visit: Payer: Medicare Other | Admitting: Cardiology

## 2017-07-14 NOTE — Progress Notes (Signed)
HPI The patient returns for one year followup.  She has a 4.1 cm thoracic aneurysm on previous CT.   It was 4.38 on the recent echo.  CT to evaluate GIST tumor demonstrated that the ascending thoracic aorta was 4.2 x 4.3.  Since I last saw her she has had no new problems.  She says she does not have energy but she still does closed and makes the bed and walks to the dining hall at [facility].   Allergies  Allergen Reactions  . Pentothal [Thiopental] Nausea And Vomiting  . Codeine   . Levofloxacin   . Oxycodone-Acetaminophen   . Sulfonamide Derivatives     REACTION: GI upset/nausea/vomiting  . Tape Rash    Per patient adhesive tape    Current Outpatient Medications  Medication Sig Dispense Refill  . amoxicillin (AMOXIL) 500 MG tablet Take 2,000 mg by mouth once. Prior to dental visits    . Ascorbic Acid (VITAMIN C) 500 MG tablet Take 500 mg by mouth daily.      Marland Kitchen aspirin 81 MG tablet Take 81 mg by mouth at bedtime.     . calcium-vitamin D (CALCIUM 500+D) 500-200 MG-UNIT per tablet Take 1 tablet by mouth daily.      . Cholecalciferol (VITAMIN D3) 2000 UNITS TABS Take 2,000 Units by mouth daily.      Marland Kitchen esomeprazole (NEXIUM) 40 MG capsule TAKE 1 CAPSULE BY MOUTH 30-60 MIN BEFORE YOUR FIRST AND LAST MEAL OF THE DAY 180 capsule 1  . losartan (COZAAR) 50 MG tablet TAKE 1 TABLET BY MOUTH EVERY MORNING 90 tablet 1  . Lutein-Zeaxanthin 20-1 MG CAPS Take 1 capsule by mouth daily.    . Multiple Vitamins-Minerals (PRESERVISION AREDS 2 PO) Take 1 tablet by mouth daily.    . simvastatin (ZOCOR) 5 MG tablet Take 1 tablet (5 mg total) by mouth at bedtime. Overdue for yearly physical w/labs must see MD for refills 90 tablet 3  . vitamin E 400 UNIT capsule Take 400 Units by mouth every morning.     No current facility-administered medications for this visit.     Past Medical History:  Diagnosis Date  . Anxiety   . Benign fundic gland polyps of stomach   . Benign gastrointestinal  stromal tumor (GIST)   . Bronchitis   . Cancer ([place])    recent skin cancer left leg  excised about 8 days ago-remains with dressing intact.   . Chronic cystitis   . COLONIC POLYPS, HX OF   . Complication of anesthesia   . Diverticulosis   . DYSLIPIDEMIA   . Gastritis   . GERD (gastroesophageal reflux disease)   . Hemorrhoids   . Hiatal hernia   . HYPERTENSION   . Macular degeneration    optho q44mo - Hecker  . Meningioma ([place])   . MITRAL VALVE PROLAPSE   . NEPHROLITHIASIS   . OSTEOARTHRITIS   . OSTEOPOROSIS   . PONV (postoperative nausea and vomiting)   . Thoracic aortic aneurysm (HCC)    4.1 cm 2015    Past Surgical History:  Procedure Laterality Date  . ABDOMINAL HYSTERECTOMY    . APPENDECTOMY    . BREAST SURGERY    . [place]   no PCI, performed in [place]  . Cataract surgery  2000  . CHOLECYSTECTOMY    . COLONOSCOPY    . ESOPHAGOGASTRODUODENOSCOPY    . KNEE SURGERY    . LEG SKIN LESION  BIOPSY /  EXCISION Left    8 days ago pending pathology, remains with dressing.  . SURGERY OF LIP  in 2002  . TONSILLECTOMY      ROS:     As stated in the HPI and negative for all other systems.  PHYSICAL EXAM BP 120/74   Pulse 74   Ht 5' 4.5" (1.638 m)   Wt 125 lb 3.2 oz (56.8 kg)   SpO2 93%   BMI 21.16 kg/m   GENERAL:  Well appearing NECK:  No jugular venous distention, waveform within normal limits, carotid upstroke brisk and symmetric, questionable bilateral bruits, no thyromegaly LUNGS:  Clear to auscultation bilaterally CHEST:  Unremarkable HEART:  PMI not displaced or sustained,S1 and S2 within normal limits, no S3, no S4, no clicks, no rubs, soft apical systolic early peaking murmur, no diastolic murmurs ABD:  Flat, positive bowel sounds normal in frequency in pitch, no bruits, no rebound, no guarding, no midline pulsatile mass, no hepatomegaly, no splenomegaly EXT:  2 plus pulses throughout, no edema, no cyanosis no clubbing   EKG:   NA  ASSESSMENT AND PLAN   AS: This has been mild.  No change in therapy is indicated.  I will follow this clinically.  MVP: This has been mild.  No change in therapy is indicated.  I will follow this clinically.  HTN:    The blood pressure is at target. No change in medications is indicated. We will continue with therapeutic lifestyle changes (TLC).   THORACIC ANEURYSM:   This is as above and I will follow this with repeat CT in 1 year.  BRUIT:  I will order carotid Doppler

## 2017-07-15 ENCOUNTER — Ambulatory Visit: Payer: Medicare Other | Admitting: Cardiology

## 2017-07-15 ENCOUNTER — Encounter: Payer: Self-pay | Admitting: Cardiology

## 2017-07-15 VITALS — BP 120/74 | HR 74 | Ht 64.5 in | Wt 125.2 lb

## 2017-07-15 DIAGNOSIS — R0989 Other specified symptoms and signs involving the circulatory and respiratory systems: Secondary | ICD-10-CM

## 2017-07-15 DIAGNOSIS — I712 Thoracic aortic aneurysm, without rupture, unspecified: Secondary | ICD-10-CM

## 2017-07-15 NOTE — Patient Instructions (Signed)
Medication Instructions:  Continue current medications  If you need a refill on your cardiac medications before your next appointment, please call your pharmacy.  Labwork: None Ordered   Testing/Procedures: Your physician has requested that you have a carotid duplex. This test is an ultrasound of the carotid arteries in your neck. It looks at blood flow through these arteries that supply the brain with blood. Allow one hour for this exam. There are no restrictions or special instructions.  Follow-Up: Your physician wants you to follow-up in: 1 Year. You should receive a reminder letter in the mail two months in advance. If you do not receive a letter, please call our office [phone].    Thank you for choosing [facility]!!      

## 2017-07-19 ENCOUNTER — Other Ambulatory Visit: Payer: Self-pay | Admitting: Internal Medicine

## 2017-07-25 ENCOUNTER — Encounter: Payer: Self-pay | Admitting: *Deleted

## 2017-07-25 NOTE — Telephone Encounter (Signed)
-----   Message from Minus Breeding, MD sent at 05/19/2017  8:00 AM EDT ----- The aorta is only mildly increased in size. No change in therapy is indicated.  We can image again in 12 months.  I will see her in 3 months.  She does have some aortic and coronary calcification which we will follow clinically.  Call Ms. Harewood with the results and send results to Biagio Borg, MD

## 2017-07-25 NOTE — Telephone Encounter (Signed)
This encounter was created in error - please disregard.

## 2017-08-02 ENCOUNTER — Ambulatory Visit (HOSPITAL_COMMUNITY)
Admission: RE | Admit: 2017-08-02 | Discharge: 2017-08-02 | Disposition: A | Discharge status: Home / Self Care | Payer: Medicare Other | Source: Ambulatory Visit | Attending: Cardiology | Admitting: Cardiology

## 2017-08-02 DIAGNOSIS — R0989 Other specified symptoms and signs involving the circulatory and respiratory systems: Secondary | ICD-10-CM | POA: Insufficient documentation

## 2017-08-02 DIAGNOSIS — I6523 Occlusion and stenosis of bilateral carotid arteries: Secondary | ICD-10-CM | POA: Diagnosis not present

## 2017-08-04 ENCOUNTER — Telehealth: Payer: Self-pay | Admitting: Cardiology

## 2017-08-04 NOTE — Telephone Encounter (Signed)
New message    Patient is returning [place] call for myocardial perfusion results

## 2017-08-04 NOTE — Telephone Encounter (Signed)
Chelsea Jordan, husband (DPR) notified he will inform pt

## 2017-08-04 NOTE — Telephone Encounter (Signed)
Notes recorded by Minus Breeding, MD on 08/03/2017 at 8:46 AM EST Mild bilateral plaque. No change in therapy or further imaging needed at this point. Call Ms. Nutter with the results and send results to Biagio Borg, MD

## 2017-08-17 ENCOUNTER — Other Ambulatory Visit: Payer: Self-pay | Admitting: *Deleted

## 2017-08-17 ENCOUNTER — Other Ambulatory Visit: Payer: Self-pay | Admitting: Cardiology

## 2017-08-17 NOTE — Telephone Encounter (Signed)
Rx has been sent to the pharmacy electronically. ° °

## 2017-10-04 ENCOUNTER — Telehealth: Payer: Self-pay | Admitting: *Deleted

## 2017-10-04 DIAGNOSIS — I712 Thoracic aortic aneurysm, without rupture, unspecified: Secondary | ICD-10-CM

## 2017-10-04 NOTE — Telephone Encounter (Signed)
CT chest Aorta ordered for 1 year

## 2017-10-24 ENCOUNTER — Telehealth: Payer: Self-pay | Admitting: Cardiology

## 2017-10-24 NOTE — Telephone Encounter (Signed)
Patient calling, states that her "main blood vessel from heart to brain is acting up."

## 2017-10-24 NOTE — Telephone Encounter (Signed)
Spoke with pt, she reports the carotid artery in her neck is larger than it has been and also she can see her heart beating in her neck. She has an appt 11-04-17 and wanted to know if there was something sooner. Aware the specific dates she is asking for dr hochrein is not in the office. She will keep follow up as scheduled.

## 2017-11-03 NOTE — Progress Notes (Signed)
HPI The patient returns for one year followup.  She has a 4.1 cm thoracic aneurysm on previous CT.   It was 4.38 on the recent echo.  CT to evaluate GIST tumor demonstrated that the ascending thoracic aorta was 4.2 x 4.3.  She had a sensation recently like her pulse was beating in her neck and she called or office.  She says that the pulsation in her neck is not bothering her in the last day or so but a couple of days ago her neck vein was up.  She was told by a friend that it was going to burst and she became naturally anxious.  He had a bit of a headache intermittently and some fullness in her ear but this is not bothering her currently.  She is not having any chest pressure, neck or arm discomfort.  She denies any palpitations, presyncope or syncope.Marland Kitchen  She walks to the dining hall.    Allergies  Allergen Reactions  . Pentothal [Thiopental] Nausea And Vomiting  . Codeine   . Levofloxacin   . Oxycodone-Acetaminophen   . Sulfonamide Derivatives     REACTION: GI upset/nausea/vomiting  . Tape Rash    Per patient adhesive tape    Current Outpatient Medications  Medication Sig Dispense Refill  . amoxicillin (AMOXIL) 500 MG tablet Take 2,000 mg by mouth once. Prior to dental visits    . Ascorbic Acid (VITAMIN C) 500 MG tablet Take 500 mg by mouth daily.      Marland Kitchen aspirin 81 MG tablet Take 81 mg by mouth at bedtime.     . calcium-vitamin D (CALCIUM 500+D) 500-200 MG-UNIT per tablet Take 1 tablet by mouth daily.      . Cholecalciferol (VITAMIN D3) 2000 UNITS TABS Take 2,000 Units by mouth daily.      Marland Kitchen esomeprazole (NEXIUM) 40 MG capsule TAKE 1 CAPSULE BY MOUTH 30-60 MIN BEFORE YOUR FIRST AND LAST MEAL OF THE DAY 180 capsule 1  . losartan (COZAAR) 50 MG tablet TAKE 1 TABLET BY MOUTH EVERY MORNING 90 tablet 3  . Lutein-Zeaxanthin 20-1 MG CAPS Take 1 capsule by mouth daily.    . Multiple Vitamins-Minerals (PRESERVISION AREDS 2 PO) Take 1 tablet by mouth daily.    . simvastatin (ZOCOR) 5 MG  tablet TAKE 1 TABLET BY MOUTH AT BEDTIME. 90 tablet 3  . vitamin E 400 UNIT capsule Take 400 Units by mouth every morning.     No current facility-administered medications for this visit.     Past Medical History:  Diagnosis Date  . Anxiety   . Benign fundic gland polyps of stomach   . Benign gastrointestinal stromal tumor (GIST)   . Bronchitis   . Cancer ([place])    recent skin cancer left leg  excised about 8 days ago-remains with dressing intact.   . Chronic cystitis   . COLONIC POLYPS, HX OF   . Complication of anesthesia   . Diverticulosis   . DYSLIPIDEMIA   . Gastritis   . GERD (gastroesophageal reflux disease)   . Hemorrhoids   . Hiatal hernia   . HYPERTENSION   . Macular degeneration    optho q12mo - Hecker  . Meningioma ([place])   . MITRAL VALVE PROLAPSE   . NEPHROLITHIASIS   . OSTEOARTHRITIS   . OSTEOPOROSIS   . PONV (postoperative nausea and vomiting)   . Thoracic aortic aneurysm (HCC)    4.1 cm 2015    Past Surgical History:  Procedure Laterality  Date  . ABDOMINAL HYSTERECTOMY    . APPENDECTOMY    . BREAST SURGERY    . [place]   no PCI, performed in [place]  . Cataract surgery  2000  . CHOLECYSTECTOMY    . COLONOSCOPY    . ESOPHAGOGASTRODUODENOSCOPY    . EUS N/A 05/16/2014   Procedure: UPPER ENDOSCOPIC ULTRASOUND (EUS) LINEAR;  Surgeon: Milus Banister, MD;  Location: [facility];  Service: Endoscopy;  Laterality: N/A;  . INSERTION OF MESH  07/04/2012   Procedure: INSERTION OF MESH;  Surgeon: Odis Hollingshead, MD;  Location: [place];  Service: General;  Laterality: N/A;  . KNEE SURGERY    . LEG SKIN LESION  BIOPSY / EXCISION Left    8 days ago pending pathology, remains with dressing.  . SURGERY OF LIP  in 2002  . TONSILLECTOMY    . VENTRAL HERNIA REPAIR  07/04/2012   Procedure: HERNIA REPAIR VENTRAL ADULT;  Surgeon: Odis Hollingshead, MD;  Location: [place];  Service: General;  Laterality: N/A;    ROS:     As stated in the HPI and  negative for all other systems.  PHYSICAL EXAM BP 124/72 (BP Location: Left Arm, Patient Position: Sitting, Cuff Size: Normal)   Pulse 70   Ht 5\' 4"  (1.626 m)   Wt 125 lb 9.6 oz (57 kg)   BMI 21.56 kg/m   GENERAL:  Well appearing NECK:  No jugular venous distention, waveform within normal limits, carotid upstroke brisk and symmetric, no bruits, no thyromegaly LUNGS:  Clear to auscultation bilaterally CHEST:  Unremarkable HEART:  PMI not displaced or sustained,S1 and S2 within normal limits, no S3, no S4, no clicks, no rubs, no murmurs ABD:  Flat, positive bowel sounds normal in frequency in pitch, no bruits, no rebound, no guarding, no midline pulsatile mass, no hepatomegaly, no splenomegaly EXT:  2 plus pulses throughout, no edema, no cyanosis no clubbing   EKG:   NA   ASSESSMENT AND PLAN   AS:  This has been mild and I will follow this clinically.    MVP:   This has been mild.  No change in therapy.   HTN:    The blood pressure is at target. No change in medications is indicated. We will continue with therapeutic lifestyle changes (TLC).  THORACIC ANEURYSM:   This was followed up in September 2018.  This will be imaged again this fall.    BRUIT:  She had mild bilateral plaque.  No further imaging is indicated.     NECK PULSATIONS: I do not see any abnormalities on her exam.  I assured her that the veins in her neck won't burst.  She is anxious and I think the reassurance helped.

## 2017-11-04 ENCOUNTER — Observation Stay (HOSPITAL_COMMUNITY)
Admission: EM | Admit: 2017-11-04 | Discharge: 2017-11-06 | Disposition: A | Discharge status: Skilled Nursing Facility | Payer: Medicare Other | Attending: Internal Medicine | Admitting: Internal Medicine

## 2017-11-04 ENCOUNTER — Emergency Department (HOSPITAL_COMMUNITY): Payer: Medicare Other

## 2017-11-04 ENCOUNTER — Other Ambulatory Visit: Payer: Self-pay

## 2017-11-04 ENCOUNTER — Encounter: Payer: Self-pay | Admitting: Cardiology

## 2017-11-04 ENCOUNTER — Ambulatory Visit: Payer: Medicare Other | Admitting: Cardiology

## 2017-11-04 ENCOUNTER — Encounter (HOSPITAL_COMMUNITY): Payer: Self-pay

## 2017-11-04 VITALS — BP 124/72 | HR 70 | Ht 64.0 in | Wt 125.6 lb

## 2017-11-04 DIAGNOSIS — Y92481 Parking lot as the place of occurrence of the external cause: Secondary | ICD-10-CM | POA: Diagnosis not present

## 2017-11-04 DIAGNOSIS — S52022A Displaced fracture of olecranon process without intraarticular extension of left ulna, initial encounter for closed fracture: Secondary | ICD-10-CM | POA: Diagnosis present

## 2017-11-04 DIAGNOSIS — Z7982 Long term (current) use of aspirin: Secondary | ICD-10-CM | POA: Insufficient documentation

## 2017-11-04 DIAGNOSIS — Z885 Allergy status to narcotic agent status: Secondary | ICD-10-CM | POA: Insufficient documentation

## 2017-11-04 DIAGNOSIS — N390 Urinary tract infection, site not specified: Secondary | ICD-10-CM | POA: Insufficient documentation

## 2017-11-04 DIAGNOSIS — I10 Essential (primary) hypertension: Secondary | ICD-10-CM

## 2017-11-04 DIAGNOSIS — R2689 Other abnormalities of gait and mobility: Secondary | ICD-10-CM | POA: Diagnosis not present

## 2017-11-04 DIAGNOSIS — Z79899 Other long term (current) drug therapy: Secondary | ICD-10-CM | POA: Diagnosis not present

## 2017-11-04 DIAGNOSIS — Z888 Allergy status to other drugs, medicaments and biological substances status: Secondary | ICD-10-CM | POA: Diagnosis not present

## 2017-11-04 DIAGNOSIS — E785 Hyperlipidemia, unspecified: Secondary | ICD-10-CM | POA: Diagnosis not present

## 2017-11-04 DIAGNOSIS — F419 Anxiety disorder, unspecified: Secondary | ICD-10-CM | POA: Diagnosis not present

## 2017-11-04 DIAGNOSIS — W010XXA Fall on same level from slipping, tripping and stumbling without subsequent striking against object, initial encounter: Secondary | ICD-10-CM | POA: Diagnosis not present

## 2017-11-04 DIAGNOSIS — Z882 Allergy status to sulfonamides status: Secondary | ICD-10-CM | POA: Diagnosis not present

## 2017-11-04 DIAGNOSIS — S52025A Nondisplaced fracture of olecranon process without intraarticular extension of left ulna, initial encounter for closed fracture: Principal | ICD-10-CM | POA: Insufficient documentation

## 2017-11-04 DIAGNOSIS — S32592A Other specified fracture of left pubis, initial encounter for closed fracture: Secondary | ICD-10-CM | POA: Insufficient documentation

## 2017-11-04 DIAGNOSIS — Z87442 Personal history of urinary calculi: Secondary | ICD-10-CM | POA: Diagnosis not present

## 2017-11-04 DIAGNOSIS — Z87440 Personal history of urinary (tract) infections: Secondary | ICD-10-CM | POA: Diagnosis not present

## 2017-11-04 DIAGNOSIS — K219 Gastro-esophageal reflux disease without esophagitis: Secondary | ICD-10-CM | POA: Diagnosis not present

## 2017-11-04 DIAGNOSIS — I712 Thoracic aortic aneurysm, without rupture, unspecified: Secondary | ICD-10-CM

## 2017-11-04 DIAGNOSIS — K5730 Diverticulosis of large intestine without perforation or abscess without bleeding: Secondary | ICD-10-CM | POA: Insufficient documentation

## 2017-11-04 DIAGNOSIS — J45909 Unspecified asthma, uncomplicated: Secondary | ICD-10-CM | POA: Diagnosis not present

## 2017-11-04 DIAGNOSIS — Z85828 Personal history of other malignant neoplasm of skin: Secondary | ICD-10-CM | POA: Insufficient documentation

## 2017-11-04 LAB — CBC WITH DIFFERENTIAL/PLATELET
Basophils Absolute: 0 10*3/uL (ref 0.0–0.1)
Basophils Relative: 0 %
Eosinophils Absolute: 0 10*3/uL (ref 0.0–0.7)
Eosinophils Relative: 0 %
HCT: 41.7 % (ref 36.0–46.0)
HEMOGLOBIN: 13.9 g/dL (ref 12.0–15.0)
LYMPHS ABS: 0.9 10*3/uL (ref 0.7–4.0)
Lymphocytes Relative: 5 %
MCH: 31.2 pg (ref 26.0–34.0)
MCHC: 33.3 g/dL (ref 30.0–36.0)
MCV: 93.7 fL (ref 78.0–100.0)
MONO ABS: 1.3 10*3/uL — AB (ref 0.1–1.0)
MONOS PCT: 8 %
NEUTROS PCT: 87 %
Neutro Abs: 14.7 10*3/uL — ABNORMAL HIGH (ref 1.7–7.7)
Platelets: 199 10*3/uL (ref 150–400)
RBC: 4.45 MIL/uL (ref 3.87–5.11)
RDW: 13 % (ref 11.5–15.5)
WBC: 16.9 10*3/uL — ABNORMAL HIGH (ref 4.0–10.5)

## 2017-11-04 LAB — BASIC METABOLIC PANEL
Anion gap: 12 (ref 5–15)
BUN: 16 mg/dL (ref 6–20)
CALCIUM: 9.5 mg/dL (ref 8.9–10.3)
CHLORIDE: 101 mmol/L (ref 101–111)
CO2: 27 mmol/L (ref 22–32)
CREATININE: 0.5 mg/dL (ref 0.44–1.00)
GFR calc Af Amer: >60 mL/min (ref >60)
GFR calc non Af Amer: >60 mL/min (ref >60)
Glucose, Bld: 89 mg/dL (ref 65–99)
Potassium: 4 mmol/L (ref 3.5–5.1)
SODIUM: 140 mmol/L (ref 135–145)

## 2017-11-04 MED ORDER — ONDANSETRON HCL 4 MG/2ML IJ SOLN
4.0000 mg | Freq: Once | INTRAMUSCULAR | Status: AC
  Administered 2017-11-04: 4 mg via INTRAVENOUS
  Filled 2017-11-04: qty 2

## 2017-11-04 MED ORDER — LORAZEPAM 2 MG/ML IJ SOLN
0.5000 mg | Freq: Once | INTRAMUSCULAR | Status: AC
  Administered 2017-11-04: 0.5 mg via INTRAVENOUS
  Filled 2017-11-04: qty 1

## 2017-11-04 NOTE — ED Triage Notes (Addendum)
Patient BIB EMS from [facility] with history of dementia. Patient was going to her cardiologist appointment today, when she fell in the parking lot. Patient has confirmed Left elbow and left pelvic fracture. MD at [facility] orthopedic concerned for left hip fracture as well. Patient has not received any pain medicine thus far. All distal pulses intact and palpable for EMS. Dr Tonita Cong to operate on patient, possibly today. Patient's last PO intake was approximately 0630 this morning.  EMS VS- 130/70, HR100, RR20.

## 2017-11-04 NOTE — ED Notes (Signed)
Patient transported to MRI 

## 2017-11-04 NOTE — ED Provider Notes (Signed)
[facility] DEPT Provider Note   CSN: [id number] Arrival date & time: 11/04/17  1549     History   Chief Complaint Chief Complaint  Patient presents with  . Fall    HPI BRIGHTON Chelsea Jordan is a 84 y.o. female.  HPI Patient states she had a trip and fall in the parking lot this afternoon around 12.  She landed on her left side.  Complains of left elbow and left pelvic pain.  Think she may have hit her head but denies headache or loss of consciousness.  States she was seen by [facility] orthopedics and had x-rays performed in the office.  Had left upper extremity splint placed for elbow fracture.  States she also was diagnosed with a pelvic fracture. Past Medical History:  Diagnosis Date  . Anxiety   . Benign fundic gland polyps of stomach   . Benign gastrointestinal stromal tumor (GIST)   . Bronchitis   . Cancer ([place])    recent skin cancer left leg  excised about 8 days ago-remains with dressing intact.   . Chronic cystitis   . COLONIC POLYPS, HX OF   . Complication of anesthesia   . Diverticulosis   . DYSLIPIDEMIA   . Gastritis   . GERD (gastroesophageal reflux disease)   . Hemorrhoids   . Hiatal hernia   . HYPERTENSION   . Macular degeneration    optho q74mo - [facility]  . Meningioma ([place])   . MITRAL VALVE PROLAPSE   . NEPHROLITHIASIS   . OSTEOARTHRITIS   . OSTEOPOROSIS   . PONV (postoperative nausea and vomiting)   . Thoracic aortic aneurysm ([facility])    4.1 cm 2015    Patient Active Problem List   Diagnosis Date Noted  . Memory dysfunction 02/09/2017  . Acute gastroenteritis 12/25/2016  . Preventative health care 07/15/2016  . Hyponatremia 12/25/2014  . Lightheadedness 12/25/2014  . Aortic stenosis 12/20/2014  . General weakness 12/18/2014  . Nausea 12/18/2014  . 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 Medicare annual wellness visit, subsequent 07/12/2014  . Thoracic  aortic aneurysm ([place]) 07/08/2014  . Recurrent UTI 03/12/2014  . Upper airway cough syndrome 01/03/2014  . Thrush 08/16/2013  . Eustachian tube dysfunction 08/16/2013  . Macular degeneration   . GERD (gastroesophageal reflux disease)   . Encounter for long-term (current) use of other medications 12/07/2010  . Mitral valve disorder 11/17/2009  . Hyperlipidemia 09/24/2009  . Essential hypertension 09/24/2009  . GENERALIZED OSTEOARTHROSIS UNSPECIFIED SITE 09/24/2009  . OSTEOPOROSIS 07/15/2009  . MENINGIOMA 10/20/2007  . 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 Asthma 10/20/2007  . NEPHROLITHIASIS 10/20/2007    Past Surgical History:  Procedure Laterality Date  . ABDOMINAL HYSTERECTOMY    . APPENDECTOMY    . BREAST SURGERY    . [place]   no PCI, performed in [place]  . Cataract surgery  2000  . CHOLECYSTECTOMY    . COLONOSCOPY    . ESOPHAGOGASTRODUODENOSCOPY    . EUS N/A 05/16/2014   Procedure: UPPER ENDOSCOPIC ULTRASOUND (EUS) LINEAR;  Surgeon: Milus Banister, MD;  Location: [facility];  Service: Endoscopy;  Laterality: N/A;  . INSERTION OF MESH  07/04/2012   Procedure: INSERTION OF MESH;  Surgeon: Odis Hollingshead, MD;  Location: [facility];  Service: General;  Laterality: N/A;  . KNEE SURGERY    . LEG SKIN LESION  BIOPSY / EXCISION Left    8 days ago  pending pathology, remains with dressing.  . SURGERY OF LIP  in 2002  . TONSILLECTOMY    . VENTRAL HERNIA REPAIR  07/04/2012   Procedure: HERNIA REPAIR VENTRAL ADULT;  Surgeon: Odis Hollingshead, MD;  Location: [facility];  Service: General;  Laterality: N/A;    OB History    No data available       Home Medications    Prior to Admission medications   Medication Sig Start Date End Date Taking? Authorizing Provider  Ascorbic Acid (VITAMIN C) 500 MG tablet Take 500 mg by mouth daily.     Yes [provider]  aspirin 81 MG tablet Take 81 mg by mouth at bedtime.    Yes [provider]    calcium-vitamin D (CALCIUM 500+D) 500-200 MG-UNIT per tablet Take 1 tablet by mouth daily.     Yes [provider]  Cholecalciferol (VITAMIN D3) 2000 UNITS TABS Take 2,000 Units by mouth daily.     Yes [provider]  esomeprazole (NEXIUM) 40 MG capsule TAKE 1 CAPSULE BY MOUTH 30-60 MIN BEFORE YOUR FIRST AND LAST MEAL OF THE DAY 03/18/17  Yes Biagio Borg, MD  losartan (COZAAR) 50 MG tablet TAKE 1 TABLET BY MOUTH EVERY MORNING 08/17/17  Yes Minus Breeding, MD  Lutein-Zeaxanthin 20-1 MG CAPS Take 1 capsule by mouth daily.   Yes [provider]  Multiple Vitamins-Minerals (PRESERVISION AREDS 2 PO) Take 1 tablet by mouth daily.   Yes [provider]  simvastatin (ZOCOR) 5 MG tablet TAKE 1 TABLET BY MOUTH AT BEDTIME. 07/19/17  Yes Biagio Borg, MD  vitamin E 400 UNIT capsule Take 400 Units by mouth every morning.   Yes [provider]  amoxicillin (AMOXIL) 500 MG tablet Take 2,000 mg by mouth once. Prior to dental visits    [provider]    Family History Family History  Problem Relation Age of Onset  . Heart disease Other   . Heart disease Mother   . Heart disease Father   . Emphysema Father   . Breast cancer Sister   . Lung cancer Sister   . Prostate cancer Son   . Prostate cancer Brother     Social History Social History   Tobacco Use  . Smoking status: Never Smoker  . Smokeless tobacco: Never Used  . Tobacco comment: Married, lives with spouse, Enjoys golf. Retired  Substance Use Topics  . Alcohol use: Yes    Alcohol/week: 0.0 oz    Comment: Wine -nightly  . Drug use: No     Allergies   Pentothal [thiopental]; Codeine; Levofloxacin; Oxycodone-acetaminophen; Sulfonamide derivatives; and Tape   Review of Systems Review of Systems  Constitutional: Negative for chills and fever.  HENT: Negative for facial swelling.   Eyes: Negative for visual disturbance.  Respiratory: Negative for cough and shortness of breath.    Cardiovascular: Negative for chest pain.  Gastrointestinal: Negative for abdominal pain, nausea and vomiting.  Musculoskeletal: Positive for arthralgias. Negative for neck pain.  Skin: Negative for rash and wound.  Neurological: Negative for dizziness, syncope, weakness, light-headedness, numbness and headaches.  All other systems reviewed and are negative.    Physical Exam Updated Vital Signs BP 129/75 (BP Location: Right Arm)   Pulse 75   Resp 17   Ht 5\' 4"  (1.626 m)   Wt 56.7 kg (125 lb)   SpO2 96%   BMI 21.46 kg/m   Physical Exam  Constitutional: She is oriented to person, place, and  time. She appears well-developed and well-nourished. No distress.  HENT:  Head: Normocephalic and atraumatic.  Mouth/Throat: Oropharynx is clear and moist.  No obvious scalp injury.  No intraoral injury.  Eyes: EOM are normal. Pupils are equal, round, and reactive to light.  Neck: Normal range of motion. Neck supple.  No posterior midline cervical tenderness to palpation.  Cardiovascular: Normal rate and regular rhythm. Exam reveals no gallop and no friction rub.  No murmur heard. Pulmonary/Chest: Effort normal and breath sounds normal. No stridor. No respiratory distress. She has no wheezes. She has no rales. She exhibits no tenderness.  Abdominal: Soft. Bowel sounds are normal. There is no tenderness. There is no rebound and no guarding.  Musculoskeletal: She exhibits no edema or tenderness.  Patient with a posterior long-arm splint in place on the left.  Distal pulses are 2+.  Patient also has a sheet binder in place across the hips.  2+ dorsalis pedis pulses with good cap refill.  Extremities are roughly equal length.  Neurological: She is alert and oriented to person, place, and time.  Moving all extremities without focal deficit though there is some limitation due to splinting.  Sensation is intact.  Skin: Skin is warm and dry. Capillary refill takes less than 2 seconds. No rash noted.  She is not diaphoretic. No erythema.  Psychiatric: She has a normal mood and affect. Her behavior is normal.  Nursing note and vitals reviewed.    ED Treatments / Results  Labs (all labs ordered are listed, but only abnormal results are displayed) Labs Reviewed  CBC WITH DIFFERENTIAL/PLATELET - Abnormal; Notable for the following components:      Result Value   WBC 16.9 (*)    Neutro Abs 14.7 (*)    Monocytes Absolute 1.3 (*)    All other components within normal limits  BASIC METABOLIC PANEL  URINALYSIS, ROUTINE W REFLEX MICROSCOPIC    EKG  EKG Interpretation None       Radiology Mr Hip Left Wo Contrast  Result Date: 11/04/2017 CLINICAL DATA:  Left hip pain due to a fall today. Initial encounter. EXAM: MR OF THE LEFT HIP WITHOUT CONTRAST TECHNIQUE: Multiplanar, multisequence MR imaging was performed. No intravenous contrast was administered. COMPARISON:  Plain films left hip today. CT abdomen and pelvis 11/28/2012. FINDINGS: Bones: There is marrow edema in the left sacrum due to a nondisplaced fracture. As seen on the comparison plain films, there are also acute fractures of the left superior and inferior pubic rami. No other fracture is identified. Specifically, there is no hip fracture. Degenerative endplate signal change D6-Q2 is noted. No avascular necrosis of the femoral heads. No evidence of neoplastic process. Articular cartilage and labrum Articular cartilage:  Mildly degenerated. Labrum:  Appears intact. Joint or bursal effusion Joint effusion:  None. Bursae: Negative. Muscles and tendons Muscles and tendons: There is marked edema in the left obturator externus, obturator internus and adductor brevis muscles consistent with strain and/or contusion related to the patient's fall. No tear is identified. Other findings Miscellaneous: Imaged intrapelvic contents demonstrate sigmoid diverticulosis without diverticulitis. The patient is status post hysterectomy. IMPRESSION: The exam  is positive for acute fractures of the left sacrum and left superior and inferior pubic rami. Negative for hip fracture. Edema in the left obturator externus, obturator internus and adductor brevis muscles consistent with strain and/or contusion related to the patient's fall. No muscle tear. Sigmoid diverticulosis. Electronically Signed   By: Inge Rise M.D.   On: 11/04/2017 21:20  Dg Hip Unilat W Or Wo Pelvis 2-3 Views Left  Result Date: 11/04/2017 CLINICAL DATA:  Fall. EXAM: DG HIP (WITH OR WITHOUT PELVIS) 2-3V LEFT COMPARISON:  CT abdomen pelvis dated April 24, 2012. FINDINGS: Acute, mildly displaced fractures of the left superior and inferior pubic rami. Old nondisplaced fracture of the right inferior pubic ramus. No definite femur fracture. The pubic symphysis and sacroiliac joints are intact. Mild bilateral hip joint space narrowing. Osteopenia. Soft tissues are unremarkable. IMPRESSION: 1. Acute, mildly displaced fractures of the left superior and inferior pubic rami. 2. No definite femur fracture. If occult hip fracture is suspected or if the patient is unable to bear weight, MRI is the preferred modality for further evaluation. Electronically Signed   By: Titus Dubin M.D.   On: 11/04/2017 16:55    Procedures Procedures (including critical care time)  Medications Ordered in ED Medications  ondansetron (ZOFRAN) injection 4 mg (4 mg Intravenous Given 11/04/17 1932)  LORazepam (ATIVAN) injection 0.5 mg (0.5 mg Intravenous Given 11/04/17 1946)     Initial Impression / Assessment and Plan / ED Course  I have reviewed the triage vital signs and the nursing notes.  Pertinent labs & imaging results that were available during my care of the patient were reviewed by me and considered in my medical decision making (see chart for details).     Discussed with Dr. Lyla Glassing.  Patient was diagnosed with a left olecranon fracture and splinted.  She also had pubic rami fracture on x-ray.   However there was concern for occult left hip fracture and she was sent for MRI. MRI with no evidence of hip fracture.  Patient remains sedated even after prolonged observation.  Discussed with social work who evaluated patient.  Will admit for observation to hospitalist Final Clinical Impressions(s) / ED Diagnoses   Final diagnoses:  Closed fracture of multiple pubic rami, left, initial encounter [facility])  Olecranon fracture, left, closed, initial encounter    ED Discharge Orders    None       Julianne Rice, MD 11/05/17 0013

## 2017-11-04 NOTE — ED Notes (Signed)
BP not taken pt has iv in right arm and left arm is wrapped due to fracture.

## 2017-11-04 NOTE — Progress Notes (Addendum)
CSW met with pt, but pt did not present as A&OX4 due to fatigue. CSW spoke to pt's sister who was bedside, who stated that if EDP could do a CM consult for RN, Aide, PT, OT and social work come to [employer] [facility] private independent living facility at [facility] then the pt's husband could make a Psychologist, prison and probation services, as to whether they will accept services offered by the RN CM on 3/1.  Pt's husband may, per pt's sister, utilize the ALF at [facility] or request nursing services from [facility] as a substitute for [facility] provided by the [employer] CM.  Pt's husband was just D/C'd home from the [facility] ED after being examined also.  Per pt's sister pt fell and "pulled her husband down with her" at their PCP in the PCP's parking lot after leaving today.  Pt's sister's primary concern is that pt not be D/C'd until tomorrow (3/2) due to pt's husband not being able to care for her tonight after his shoulder being hurt.  CSW will update EDP.  11:27 PM EDP updated.  Please reconsult if future social work needs arise.  CSW signing off, as social work intervention is no longer needed.  Chelsea Jordan. Chelsea Golding, LCSW, LCAS, CSI Clinical Social Worker Ph: [phone]

## 2017-11-04 NOTE — ED Notes (Signed)
Bed: BP79 Expected date:  Expected time:  Means of arrival:  Comments: Fall, hip fx?

## 2017-11-04 NOTE — Patient Instructions (Signed)
Medication Instructions:  Continue current medications  If you need a refill on your cardiac medications before your next appointment, please call your pharmacy.  Labwork: None Ordered   Testing/Procedures: None Ordered  Follow-Up: Your physician wants you to follow-up in: 1 Year. You should receive a reminder letter in the mail two months in advance. If you do not receive a letter, please call our office [phone].    Thank you for choosing [facility]!!      

## 2017-11-04 NOTE — ED Notes (Signed)
Patient back from MRI. Was unable to complete MRI due to patient feeling hot, patient feeling nauseated, and patient feeling like she was going to have a bowel movement. EDMD aware. Medications ordered for the next time patient goes to MRI

## 2017-11-05 DIAGNOSIS — S52022A Displaced fracture of olecranon process without intraarticular extension of left ulna, initial encounter for closed fracture: Secondary | ICD-10-CM | POA: Diagnosis not present

## 2017-11-05 DIAGNOSIS — S32592A Other specified fracture of left pubis, initial encounter for closed fracture: Secondary | ICD-10-CM | POA: Diagnosis not present

## 2017-11-05 LAB — URINALYSIS, ROUTINE W REFLEX MICROSCOPIC
Bilirubin Urine: NEGATIVE
GLUCOSE, UA: NEGATIVE mg/dL
HGB URINE DIPSTICK: NEGATIVE
Ketones, ur: 80 mg/dL — AB
Nitrite: POSITIVE — AB
PH: 7 (ref 5.0–8.0)
PROTEIN: 100 mg/dL — AB
Specific Gravity, Urine: 1.021 (ref 1.005–1.030)
Squamous Epithelial / LPF: NONE SEEN

## 2017-11-05 MED ORDER — CEPHALEXIN 500 MG PO CAPS
500.0000 mg | ORAL_CAPSULE | Freq: Two times a day (BID) | ORAL | Status: DC
  Administered 2017-11-05 – 2017-11-06 (×3): 500 mg via ORAL
  Filled 2017-11-05 (×3): qty 1

## 2017-11-05 MED ORDER — SIMVASTATIN 5 MG PO TABS
5.0000 mg | ORAL_TABLET | Freq: Every day | ORAL | Status: DC
  Administered 2017-11-05 (×2): 5 mg via ORAL
  Filled 2017-11-05 (×2): qty 1

## 2017-11-05 MED ORDER — CALCIUM CARBONATE-VITAMIN D 500-200 MG-UNIT PO TABS
1.0000 | ORAL_TABLET | Freq: Every day | ORAL | Status: DC
  Administered 2017-11-05 – 2017-11-06 (×2): 1 via ORAL
  Filled 2017-11-05 (×3): qty 1

## 2017-11-05 MED ORDER — ZOLPIDEM TARTRATE 5 MG PO TABS: 5.0000 mg | ORAL_TABLET | Freq: Every evening | ORAL | Status: DC | PRN

## 2017-11-05 MED ORDER — OXYCODONE HCL 5 MG PO TABS: 5.0000 mg | ORAL_TABLET | ORAL | Status: DC | PRN

## 2017-11-05 MED ORDER — ONDANSETRON HCL 4 MG/2ML IJ SOLN: 4.0000 mg | Freq: Four times a day (QID) | INTRAMUSCULAR | Status: DC | PRN

## 2017-11-05 MED ORDER — SODIUM CHLORIDE 0.9% FLUSH
3.0000 mL | Freq: Two times a day (BID) | INTRAVENOUS | Status: DC
  Administered 2017-11-05: 10:00:00 3 mL via INTRAVENOUS

## 2017-11-05 MED ORDER — ACETAMINOPHEN 650 MG RE SUPP: 650.0000 mg | Freq: Four times a day (QID) | RECTAL | Status: DC | PRN

## 2017-11-05 MED ORDER — SODIUM CHLORIDE 0.9 % IV SOLN: 250.0000 mL | INTRAVENOUS | Status: DC | PRN

## 2017-11-05 MED ORDER — MORPHINE SULFATE (PF) 2 MG/ML IV SOLN: 2.0000 mg | INTRAVENOUS | Status: DC | PRN

## 2017-11-05 MED ORDER — ASPIRIN EC 81 MG PO TBEC
81.0000 mg | DELAYED_RELEASE_TABLET | Freq: Every day | ORAL | Status: DC
  Administered 2017-11-05 (×2): 81 mg via ORAL
  Filled 2017-11-05 (×2): qty 1

## 2017-11-05 MED ORDER — LOSARTAN POTASSIUM 50 MG PO TABS
50.0000 mg | ORAL_TABLET | Freq: Every morning | ORAL | Status: DC
  Administered 2017-11-05 – 2017-11-06 (×2): 50 mg via ORAL
  Filled 2017-11-05 (×2): qty 1

## 2017-11-05 MED ORDER — ACETAMINOPHEN 325 MG PO TABS
650.0000 mg | ORAL_TABLET | Freq: Four times a day (QID) | ORAL | Status: DC | PRN
  Administered 2017-11-05 – 2017-11-06 (×4): 650 mg via ORAL
  Filled 2017-11-05 (×4): qty 2

## 2017-11-05 MED ORDER — VITAMIN D 1000 UNITS PO TABS
2000.0000 [IU] | ORAL_TABLET | Freq: Every day | ORAL | Status: DC
  Administered 2017-11-05 – 2017-11-06 (×2): 2000 [IU] via ORAL
  Filled 2017-11-05 (×2): qty 2

## 2017-11-05 MED ORDER — ENOXAPARIN SODIUM 30 MG/0.3ML ~~LOC~~ SOLN
30.0000 mg | Freq: Every day | SUBCUTANEOUS | Status: DC
  Administered 2017-11-05 (×2): 30 mg via SUBCUTANEOUS
  Filled 2017-11-05 (×2): qty 0.3

## 2017-11-05 MED ORDER — ONDANSETRON HCL 4 MG PO TABS: 4.0000 mg | ORAL_TABLET | Freq: Four times a day (QID) | ORAL | Status: DC | PRN

## 2017-11-05 MED ORDER — SODIUM CHLORIDE 0.9% FLUSH: 3.0000 mL | INTRAVENOUS | Status: DC | PRN

## 2017-11-05 MED ORDER — PANTOPRAZOLE SODIUM 40 MG PO TBEC
40.0000 mg | DELAYED_RELEASE_TABLET | Freq: Every day | ORAL | Status: DC
  Administered 2017-11-05 – 2017-11-06 (×2): 40 mg via ORAL
  Filled 2017-11-05 (×2): qty 1

## 2017-11-05 NOTE — H&P (Signed)
[facility]                                                                                    Patient Demographics  Chelsea Jordan, is a 84 y.o. female  CSN: [id number]  MRN: [id number]  DOB - March 04, 1927  Admit Date - 11/04/2017  Outpatient Primary MD for the patient is Biagio Borg, MD   With History of -  Past Medical History:  Diagnosis Date  . Anxiety   . Benign fundic gland polyps of stomach   . Benign gastrointestinal stromal tumor (GIST)   . Bronchitis   . Cancer ([place])    recent skin cancer left leg  excised about 8 days ago-remains with dressing intact.   . Chronic cystitis   . COLONIC POLYPS, HX OF   . Complication of anesthesia   . Diverticulosis   . DYSLIPIDEMIA   . Gastritis   . GERD (gastroesophageal reflux disease)   . Hemorrhoids   . Hiatal hernia   . HYPERTENSION   . Macular degeneration    optho q9mo - [place]  . Meningioma ([place])   . MITRAL VALVE PROLAPSE   . NEPHROLITHIASIS   . OSTEOARTHRITIS   . OSTEOPOROSIS   . PONV (postoperative nausea and vomiting)   . Thoracic aortic aneurysm (HCC)    4.1 cm 2015      Past Surgical History:  Procedure Laterality Date  . ABDOMINAL HYSTERECTOMY    . APPENDECTOMY    . BREAST SURGERY    . [place]   no PCI, performed in [place]  . Cataract surgery  2000  . CHOLECYSTECTOMY    . COLONOSCOPY    . ESOPHAGOGASTRODUODENOSCOPY    . EUS N/A 05/16/2014   Procedure: UPPER ENDOSCOPIC ULTRASOUND (EUS) LINEAR;  Surgeon: Milus Banister, MD;  Location: [facility];  Service: Endoscopy;  Laterality: N/A;  . INSERTION OF MESH  07/04/2012   Procedure: INSERTION OF MESH;  Surgeon: Odis Hollingshead, MD;  Location: [facility];  Service: General;  Laterality: N/A;  . KNEE SURGERY    . LEG SKIN LESION  BIOPSY / EXCISION Left    8 days ago pending pathology, remains with dressing.  . SURGERY OF LIP  in 2002  . TONSILLECTOMY    . VENTRAL HERNIA REPAIR  07/04/2012   Procedure: HERNIA REPAIR  VENTRAL ADULT;  Surgeon: Odis Hollingshead, MD;  Location: [facility];  Service: General;  Laterality: N/A;    in for   Chief Complaint  Patient presents with  . Fall     HPI  Chelsea Jordan  is a 84 y.o. female, with past medical history significant for hypertension and meningioma who fell in the parking lot on her left side.  The patient reports that she slipped.  No prior chest pain palpitations, headaches or dizziness.  She was seen at [facility] prior to coming to the emergency room and was found to have left olecranon fracture and a splint was placed by Dr. Marlou Sa.  The patient is admitted for pain control and observation after receiving Ativan prior to MRI done to rule out left hip fracture.  Patient was  noted to have acute mildly displaced fractures of the left superior and inferior pubic rami.    Review of Systems    In addition to the HPI above,  No Fever-chills, No Headache, No changes with Vision or hearing, No problems swallowing food or Liquids, No Chest pain, Cough or Shortness of Breath, No Abdominal pain, No Nausea or Vommitting, Bowel movements are regular, No Blood in stool or Urine, No dysuria, No new skin rashes or bruises, No new weakness, tingling, numbness in any extremity, No recent weight gain or loss, No polyuria, polydypsia or polyphagia, No significant Mental Stressors.  A full 10 point Review of Systems was done, except as stated above, all other Review of Systems were negative.   Social History Social History   Tobacco Use  . Smoking status: Never Smoker  . Smokeless tobacco: Never Used  . Tobacco comment: Married, lives with spouse, Enjoys golf. Retired  Substance Use Topics  . Alcohol use: Yes    Alcohol/week: 0.0 oz    Comment: Wine -nightly     Family History Family History  Problem Relation Age of Onset  . Heart disease Other   . Heart disease Mother   . Heart disease Father   . Emphysema Father   . Breast cancer Sister   .  Lung cancer Sister   . Prostate cancer Son   . Prostate cancer Brother      Prior to Admission medications   Medication Sig Start Date End Date Taking? Authorizing Provider  Ascorbic Acid (VITAMIN C) 500 MG tablet Take 500 mg by mouth daily.     Yes [provider]  aspirin 81 MG tablet Take 81 mg by mouth at bedtime.    Yes [provider]  calcium-vitamin D (CALCIUM 500+D) 500-200 MG-UNIT per tablet Take 1 tablet by mouth daily.     Yes [provider]  Cholecalciferol (VITAMIN D3) 2000 UNITS TABS Take 2,000 Units by mouth daily.     Yes [provider]  esomeprazole (NEXIUM) 40 MG capsule TAKE 1 CAPSULE BY MOUTH 30-60 MIN BEFORE YOUR FIRST AND LAST MEAL OF THE DAY 03/18/17  Yes Biagio Borg, MD  losartan (COZAAR) 50 MG tablet TAKE 1 TABLET BY MOUTH EVERY MORNING 08/17/17  Yes Minus Breeding, MD  Lutein-Zeaxanthin 20-1 MG CAPS Take 1 capsule by mouth daily.   Yes [provider]  Multiple Vitamins-Minerals (PRESERVISION AREDS 2 PO) Take 1 tablet by mouth daily.   Yes [provider]  simvastatin (ZOCOR) 5 MG tablet TAKE 1 TABLET BY MOUTH AT BEDTIME. 07/19/17  Yes Biagio Borg, MD  vitamin E 400 UNIT capsule Take 400 Units by mouth every morning.   Yes [provider]  amoxicillin (AMOXIL) 500 MG tablet Take 2,000 mg by mouth once. Prior to dental visits    [provider]    Allergies  Allergen Reactions  . Pentothal [Thiopental] Nausea And Vomiting  . Codeine   . Levofloxacin   . Oxycodone-Acetaminophen   . Sulfonamide Derivatives     REACTION: GI upset/nausea/vomiting  . Tape Rash    Per patient adhesive tape    Physical Exam  Vitals  Blood pressure 129/75, pulse 75, resp. rate 17, height 5\' 4"  (1.626 m), weight 56.7 kg (125 lb), SpO2 96 %.   1. General elderly female, hard of hearing, extremely pleasant  2. Normal affect and insight, Not Suicidal or Homicidal, mildly confused.  3. No F.N  deficits, grossly, patient moving all extremities.  4. Ears and Eyes appear Normal, Conjunctivae clear, PERRLA. Moist Oral Mucosa.  5. Supple Neck, No JVD, No cervical lymphadenopathy appriciated, No Carotid Bruits.  6. Symmetrical Chest wall movement, Good air movement bilaterally, CTAB.  7. RRR, No Gallops, Rubs or Murmurs, No Parasternal Heave.  8. Positive Bowel Sounds, Abdomen Soft, Non tender, No organomegaly appriciated,No rebound -guarding or rigidity.  9.  No Cyanosis, Normal Skin Turgor, No Skin Rash or Bruise.  10. Good muscle tone,  joints appear normal , no effusions, Normal ROM.    Data Review  CBC Recent Labs  Lab 11/04/17 2138  WBC 16.9*  HGB 13.9  HCT 41.7  PLT 199  MCV 93.7  MCH 31.2  MCHC 33.3  RDW 13.0  LYMPHSABS 0.9  MONOABS 1.3*  EOSABS 0.0  BASOSABS 0.0   ------------------------------------------------------------------------------------------------------------------  Chemistries  Recent Labs  Lab 11/04/17 2138  NA 140  K 4.0  CL 101  CO2 27  GLUCOSE 89  BUN 16  CREATININE 0.50  CALCIUM 9.5   ------------------------------------------------------------------------------------------------------------------ estimated creatinine clearance is 42 mL/min (by C-G formula based on SCr of 0.5 mg/dL). ------------------------------------------------------------------------------------------------------------------ No results for input(s): TSH, T4TOTAL, T3FREE, THYROIDAB in the last 72 hours.  Invalid input(s): FREET3   Coagulation profile No results for input(s): INR, PROTIME in the last 168 hours. ------------------------------------------------------------------------------------------------------------------- No results for input(s): DDIMER in the last 72 hours. -------------------------------------------------------------------------------------------------------------------  Cardiac Enzymes No results for input(s): CKMB, TROPONINI,  MYOGLOBIN in the last 168 hours.  Invalid input(s): CK ------------------------------------------------------------------------------------------------------------------ Invalid input(s): POCBNP   ---------------------------------------------------------------------------------------------------------------  Urinalysis    Component Value Date/Time   COLORURINE YELLOW 02/09/2017 1530   APPEARANCEUR CLEAR 02/09/2017 1530   LABSPEC 1.015 02/09/2017 1530   PHURINE 7.0 02/09/2017 1530   GLUCOSEU NEGATIVE 02/09/2017 1530   HGBUR NEGATIVE 02/09/2017 1530   BILIRUBINUR NEGATIVE 02/09/2017 1530   BILIRUBINUR negative 07/12/2014 0851   KETONESUR 40 (A) 02/09/2017 1530   PROTEINUR 2+ 07/12/2014 0851   PROTEINUR 100 (A) 01/17/2009 1118   UROBILINOGEN 1.0 02/09/2017 1530   NITRITE NEGATIVE 02/09/2017 1530   LEUKOCYTESUR MODERATE (A) 02/09/2017 1530    ----------------------------------------------------------------------------------------------------------------   Imaging results:   Mr Hip Left Wo Contrast  Result Date: 11/04/2017 CLINICAL DATA:  Left hip pain due to a fall today. Initial encounter. EXAM: MR OF THE LEFT HIP WITHOUT CONTRAST TECHNIQUE: Multiplanar, multisequence MR imaging was performed. No intravenous contrast was administered. COMPARISON:  Plain films left hip today. CT abdomen and pelvis 11/28/2012. FINDINGS: Bones: There is marrow edema in the left sacrum due to a nondisplaced fracture. As seen on the comparison plain films, there are also acute fractures of the left superior and inferior pubic rami. No other fracture is identified. Specifically, there is no hip fracture. Degenerative endplate signal change X3-K4 is noted. No avascular necrosis of the femoral heads. No evidence of neoplastic process. Articular cartilage and labrum Articular cartilage:  Mildly degenerated. Labrum:  Appears intact. Joint or bursal effusion Joint effusion:  None. Bursae: Negative. Muscles and  tendons Muscles and tendons: There is marked edema in the left obturator externus, obturator internus and adductor brevis muscles consistent with strain and/or contusion related to the patient's fall. No tear is identified. Other findings Miscellaneous: Imaged intrapelvic contents demonstrate sigmoid diverticulosis without diverticulitis. The patient is status post hysterectomy. IMPRESSION: The exam is positive for acute fractures of the left sacrum and left superior and inferior pubic rami. Negative for hip fracture. Edema in the left obturator externus, obturator internus and adductor brevis muscles  consistent with strain and/or contusion related to the patient's fall. No muscle tear. Sigmoid diverticulosis. Electronically Signed   By: Inge Rise M.D.   On: 11/04/2017 21:20   Dg Hip Unilat W Or Wo Pelvis 2-3 Views Left  Result Date: 11/04/2017 CLINICAL DATA:  Fall. EXAM: DG HIP (WITH OR WITHOUT PELVIS) 2-3V LEFT COMPARISON:  CT abdomen pelvis dated April 24, 2012. FINDINGS: Acute, mildly displaced fractures of the left superior and inferior pubic rami. Old nondisplaced fracture of the right inferior pubic ramus. No definite femur fracture. The pubic symphysis and sacroiliac joints are intact. Mild bilateral hip joint space narrowing. Osteopenia. Soft tissues are unremarkable. IMPRESSION: 1. Acute, mildly displaced fractures of the left superior and inferior pubic rami. 2. No definite femur fracture. If occult hip fracture is suspected or if the patient is unable to bear weight, MRI is the preferred modality for further evaluation. Electronically Signed   By: Titus Dubin M.D.   On: 11/04/2017 16:55      Assessment & Plan   1.  Left olecranon fracture      Status post splint      Please consult Dr. Marlou Sa in a.m. for possible cast placement 2.  Left pelvic fracture      Pain control      Her reaction to codeine is nausea  Plan  Will admit for pain control and observation due to Ativan  received during MRI.  Patient placed n.p.o. after midnight, please change if not needed in a.m.  DVT Prophylaxis Lovenox  AM Labs Ordered, also please review Full Orders    Code Status full  Disposition Plan: Home  Time spent in minutes : 38 minutes  Condition GUARDED   @SIGNATURE @

## 2017-11-05 NOTE — Care Management Note (Signed)
Case Management Note  Patient Details  Name: Chelsea Jordan MRN: [id number] Date of Birth: [birth year]  Subjective/Objective:  Closed hip fracture                  Action/Plan: Pt is from [facility]. They have ALF and SNF available. Waiting PT/OT evaluation. CSW contacted with referral.   Expected Discharge Date:                 Expected Discharge Plan:  [place]  In-House Referral:  Clinical Social Work  Discharge planning Services  CM Consult  Post Acute Care Choice:  NA Choice offered to:  NA  DME Arranged:  N/A DME Agency:  NA  HH Arranged:  NA HH Agency:  NA  Status of Service:  Completed, signed off  If discussed at [employer] of Stay Meetings, dates discussed:    Additional Comments:  Erenest Rasher, RN 11/05/2017, 4:06 PM

## 2017-11-05 NOTE — Evaluation (Signed)
Occupational Therapy Evaluation Patient Details Name: Chelsea Jordan MRN: [id number] DOB: Jun 02, 1927 Today's Date: 11/05/2017    History of Present Illness pt fell yesterday (11/04/2017) in her [address] parking lot and sustained a L olecranon fracture and Left superior and inferior pubic rami fracture.    Clinical Impression   Pt admitted s/p fall with pubic and olecranon fx . Pt currently with functional limitations due to the deficits listed below (see OT Problem List).  Pt will benefit from skilled OT to increase their safety and independence with ADL and functional mobility for ADL to facilitate discharge to venue listed below.      Follow Up Recommendations  SNF;Other (comment)(or [facility])    Equipment Recommendations  None recommended by OT    Recommendations for Other Services       Precautions / Restrictions Precautions Precautions: Other (comment) Precaution Comments: Left arm in splint  Required Braces or Orthoses: Other Brace/Splint Other Brace/Splint: L arm  Restrictions Weight Bearing Restrictions: Yes LUE Weight Bearing: Non weight bearing LLE Weight Bearing: Weight bearing as tolerated Other Position/Activity Restrictions: ortho MD stated that she can be WBAT for pivots from bed to chair for the first 2 weeks until she sees MD again.       Mobility Bed Mobility Overal bed mobility: Needs Assistance Bed Mobility: Supine to Sit;Sit to Supine     Supine to sit: Mod assist     General bed mobility comments: assist with upper body due to NWB on LUE , and assit with scooting   Transfers Overall transfer level: Needs assistance Equipment used: Hemi-walker Transfers: Sit to/from Stand Sit to Stand: Mod assist         General transfer comment: assist to rise, less assist to sit from standing . practiced pivoting to recliner and sit to stand again.     Balance Overall balance assessment: Needs assistance Sitting-balance support: Single  extremity supported Sitting balance-Leahy Scale: Fair                                     ADL either performed or assessed with clinical judgement   ADL Overall ADL's : Needs assistance/impaired Eating/Feeding: Minimal assistance;Sitting   Grooming: Minimal assistance;Sitting   Upper Body Bathing: Moderate assistance;Sitting   Lower Body Bathing: Maximal assistance;Sit to/from stand;Cueing for safety;Cueing for sequencing   Upper Body Dressing : Minimal assistance;Sitting   Lower Body Dressing: Maximal assistance;Sit to/from stand;Cueing for safety;Cueing for sequencing     Toilet Transfer Details (indicate cue type and reason): with hemi walker.  Bed to recliner Toileting- Clothing Manipulation and Hygiene: Maximal assistance;Sit to/from stand;Cueing for sequencing;Cueing for compensatory techniques;Cueing for safety         General ADL Comments: VC to not use LUE or put weight via L elbow     Vision Patient Visual Report: No change from baseline       Perception     Praxis      Pertinent Vitals/Pain Pain Assessment: 0-10 Pain Score: 4  Pain Location: pain in Left elbow and Left hip/pelvis area with very little movment an esepcialy with attempted to pivot and WB a little on LLE  Pain Descriptors / Indicators: Aching Pain Intervention(s): Limited activity within patient's tolerance;Monitored during session     Hand Dominance     Extremity/Trunk Assessment Upper Extremity Assessment Upper Extremity Assessment: LUE deficits/detail LUE Deficits / Details: L olecrenon fx. Pt  in splint with ACE wrap. Sling provided. Pt is NWB   Lower Extremity Assessment Lower Extremity Assessment: Generalized weakness       Communication Communication Communication: No difficulties   Cognition Arousal/Alertness: Awake/alert Behavior During Therapy: WFL for tasks assessed/performed Overall Cognitive Status: Within Functional Limits for tasks assessed                                                 Home Living Family/patient expects to be discharged to:: Other (Comment)(Her and her husband live in [place] living at [facility] ) Living Arrangements: Spouse/significant other(he uses a cane and was pulled down when she fell) Available Help at Discharge: Family Type of Home: House Home Access: Level entry     Home Layout: One level               Home Equipment: Environmental consultant - 2 wheels;Bedside commode          Prior Functioning/Environment Level of Independence: Independent                 OT Problem List: Decreased strength;Decreased activity tolerance;Decreased range of motion;Impaired balance (sitting and/or standing);Decreased safety awareness;Decreased knowledge of precautions;Decreased knowledge of use of DME or AE      OT Treatment/Interventions: Self-care/ADL training;Balance training;Patient/family education    OT Goals(Current goals can be found in the care plan section) Acute Rehab OT Goals Patient Stated Goal: I want to get better  OT Goal Formulation: With patient Time For Goal Achievement: 11/19/17 Potential to Achieve Goals: Good  OT Frequency: Min 2X/week   Barriers to D/C:               AM-PAC PT "6 Clicks" Daily Activity     Outcome Measure Help from another person eating meals?: A Little Help from another person taking care of personal grooming?: A Little Help from another person toileting, which includes using toliet, bedpan, or urinal?: A Lot Help from another person bathing (including washing, rinsing, drying)?: A Lot Help from another person to put on and taking off regular upper body clothing?: A Lot Help from another person to put on and taking off regular lower body clothing?: A Lot 6 Click Score: 14   End of Session Equipment Utilized During Treatment: Other (comment)(hemi walker) Nurse Communication: Mobility status;Weight bearing status  Activity Tolerance: Patient  tolerated treatment well Patient left: in chair;with call bell/phone within reach;with family/visitor present;with chair alarm set  OT Visit Diagnosis: Unsteadiness on feet (R26.81);Other abnormalities of gait and mobility (R26.89);History of falling (Z91.81);Muscle weakness (generalized) (M62.81);Pain                Time: 8416-6063 OT Time Calculation (min): 21 min Charges:  OT General Charges $OT Visit: 1 Visit OT Evaluation $OT Eval Moderate Complexity: 1 Mod G-Codes:     Kari Baars, [place]  Payton Mccallum D 11/05/2017, 4:22 PM

## 2017-11-05 NOTE — NC FL2 (Signed)
[place] LEVEL OF CARE SCREENING TOOL     IDENTIFICATION  Patient Name: Chelsea Jordan Birthdate: [birth year] Sex: female Admission Date (Current Location): 11/04/2017  [facility] and [place] Number:  Herbalist and Address:  [facility],  [address] [place], [place]      Provider Number: [id number]  Attending Physician Name and Address:  Hosie Poisson, MD  Relative Name and Phone Number:  Dinia Joynt, [phone]    Current Level of Care: Hospital Recommended Level of Care: [place] Prior Approval Number:    Date Approved/Denied:   PASRR Number: [id number]  Discharge Plan: SNF    Current Diagnoses: Patient Active Problem List   Diagnosis Date Noted  . Olecranon fracture, left, closed, initial encounter 11/05/2017  . Memory dysfunction 02/09/2017  . Acute gastroenteritis 12/25/2016  . Preventative health care 07/15/2016  . Hyponatremia 12/25/2014  . Lightheadedness 12/25/2014  . Aortic stenosis 12/20/2014  . General weakness 12/18/2014  . Nausea 12/18/2014  . 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 Medicare annual wellness visit, subsequent 07/12/2014  . Thoracic aortic aneurysm ([place]) 07/08/2014  . Recurrent UTI 03/12/2014  . Upper airway cough syndrome 01/03/2014  . Thrush 08/16/2013  . Eustachian tube dysfunction 08/16/2013  . Macular degeneration   . GERD (gastroesophageal reflux disease)   . Encounter for long-term (current) use of other medications 12/07/2010  . Mitral valve disorder 11/17/2009  . Hyperlipidemia 09/24/2009  . Essential hypertension 09/24/2009  . GENERALIZED OSTEOARTHROSIS UNSPECIFIED SITE 09/24/2009  . OSTEOPOROSIS 07/15/2009  . MENINGIOMA 10/20/2007  . 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 Asthma 10/20/2007  . NEPHROLITHIASIS 10/20/2007    Orientation RESPIRATION BLADDER Height & Weight     Self, Time,  Situation, Place  O2 External catheter Weight: 125 lb (56.7 kg) Height:  5\' 4"  (162.6 cm)  BEHAVIORAL SYMPTOMS/MOOD NEUROLOGICAL BOWEL NUTRITION STATUS      Continent Diet(regular)  AMBULATORY STATUS COMMUNICATION OF NEEDS Skin   Limited Assist Verbally                         Personal Care Assistance Level of Assistance  Bathing, Feeding, Dressing Bathing Assistance: Limited assistance(non weight bearing on L arm) Feeding assistance: Independent Dressing Assistance: Limited assistance(Non weight bearing on L arm)     Functional Limitations Info  Sight, Hearing, Speech Sight Info: Adequate Hearing Info: Adequate Speech Info: Adequate    SPECIAL CARE FACTORS FREQUENCY  PT (By licensed PT), OT (By licensed OT)     PT Frequency: 5x wk OT Frequency: 5x wk            Contractures Contractures Info: Not present    Additional Factors Info  Code Status, Allergies Code Status Info: Full Code Allergies Info: PENTOTHAL THIOPENTAL, CODEINE, LEVOFLOXACIN, OXYCODONE-ACETAMINOPHEN, SULFONAMIDE DERIVATIVES, TAPE           Current Medications (11/05/2017):  This is the current hospital active medication list Current Facility-Administered Medications  Medication Dose Route Frequency Provider Last Rate Last Dose  . 0.9 %  sodium chloride infusion  250 mL Intravenous PRN Merton Border, MD      . acetaminophen (TYLENOL) tablet 650 mg  650 mg Oral Q6H PRN Merton Border, MD   650 mg at 11/05/17 1421   Or  . acetaminophen (TYLENOL) suppository 650 mg  650 mg Rectal Q6H PRN Merton Border, MD      . aspirin EC  tablet 81 mg  81 mg Oral QHS Merton Border, MD   81 mg at 11/05/17 0230  . calcium-vitamin D (OSCAL WITH D) 500-200 MG-UNIT per tablet 1 tablet  1 tablet Oral Daily Merton Border, MD   1 tablet at 11/05/17 1023  . cephALEXin (KEFLEX) capsule 500 mg  500 mg Oral Q12H Hosie Poisson, MD   500 mg at 11/05/17 1440  . cholecalciferol (VITAMIN D) tablet 2,000 Units  2,000 Units Oral Daily Merton Border, MD   2,000 Units at 11/05/17 1023  . enoxaparin (LOVENOX) injection 30 mg  30 mg Subcutaneous QHS Merton Border, MD   30 mg at 11/05/17 0230  . losartan (COZAAR) tablet 50 mg  50 mg Oral q morning - 10a Merton Border, MD   50 mg at 11/05/17 1023  . morphine 2 MG/ML injection 2 mg  2 mg Intravenous Q4H PRN Merton Border, MD      . ondansetron (ZOFRAN) tablet 4 mg  4 mg Oral Q6H PRN Merton Border, MD       Or  . ondansetron (ZOFRAN) injection 4 mg  4 mg Intravenous Q6H PRN Merton Border, MD      . oxyCODONE (Oxy IR/ROXICODONE) immediate release tablet 5 mg  5 mg Oral Q4H PRN Merton Border, MD      . pantoprazole (PROTONIX) EC tablet 40 mg  40 mg Oral Daily Merton Border, MD   40 mg at 11/05/17 1023  . simvastatin (ZOCOR) tablet 5 mg  5 mg Oral QHS Merton Border, MD   5 mg at 11/05/17 0230  . sodium chloride flush (NS) 0.9 % injection 3 mL  3 mL Intravenous Q12H Merton Border, MD   3 mL at 11/05/17 1025  . sodium chloride flush (NS) 0.9 % injection 3 mL  3 mL Intravenous PRN Merton Border, MD      . zolpidem (AMBIEN) tablet 5 mg  5 mg Oral QHS PRN Merton Border, MD         Discharge Medications: Please see discharge summary for a list of discharge medications.  Relevant Imaging Results:  Relevant Lab Results:   Additional Information [id number]  Wende Neighbors, LCSW

## 2017-11-05 NOTE — Evaluation (Signed)
Physical Therapy Evaluation Patient Details Name: Chelsea Jordan MRN: [id number] DOB: Jun 18, 1927 Today's Date: 11/05/2017   History of Present Illness  pt fell yesterday (11/04/2017) in her 78 parking lot and sustained a L olecranon fracture and Left superior and inferior pubic rami fracture.   Clinical Impression  Pt with L olecranon fracture NWB LUE and L superior and inferior pubic rami fracture with WBAT for pivots only for now presents with limitations with mobility and needing assistance for all mobility at the moment. To benefit from further PT to help progress with pivot transfers and safety.     Follow Up Recommendations SNF    Equipment Recommendations  Other (comment)(next venue can provide but will need WC, cushion and hemiwalker )    Recommendations for Other Services       Precautions / Restrictions Precautions Precautions: Other (comment) Precaution Comments: Left arm in splint  Required Braces or Orthoses: Other Brace/Splint Other Brace/Splint: L arm  Restrictions Weight Bearing Restrictions: Yes LUE Weight Bearing: Non weight bearing LLE Weight Bearing: Weight bearing as tolerated Other Position/Activity Restrictions: ortho MD stated that she can be WBAT for pivots from bed to chair for the first 2 weeks until she sees MD again.       Mobility  Bed Mobility Overal bed mobility: Needs Assistance Bed Mobility: Supine to Sit;Sit to Supine     Supine to sit: Mod assist     General bed mobility comments: assist with upper body due to NWB on LUE , and assit with scooting   Transfers Overall transfer level: Needs assistance Equipment used: Hemi-walker Transfers: Sit to/from Stand Sit to Stand: Mod assist         General transfer comment: assist to rise, less assist to sit from standing . practiced pivoting to recliner and sit to stand again.   Ambulation/Gait                Stairs            Wheelchair Mobility    Modified Rankin  (Stroke Patients Only)       Balance Overall balance assessment: Needs assistance Sitting-balance support: Single extremity supported Sitting balance-Leahy Scale: Fair                                       Pertinent Vitals/Pain Pain Assessment: 0-10 Pain Score: 4  Pain Location: pain in Left elbow and Left hip/pelvis area with very little movment an esepcialy with attempted to pivot and WB a little on LLE  Pain Descriptors / Indicators: Aching Pain Intervention(s): Limited activity within patient's tolerance;Monitored during session    Home Living Family/patient expects to be discharged to:: Other (Comment)(Her and her husband live in [place] living at [facility] ) Living Arrangements: Spouse/significant other(he uses a cane and was pulled down when she fell) Available Help at Discharge: Family Type of Home: House Home Access: Level entry     Home Layout: One level Home Equipment: Environmental consultant - 2 wheels;Bedside commode      Prior Function Level of Independence: Independent               Hand Dominance        Extremity/Trunk Assessment        Lower Extremity Assessment Lower Extremity Assessment: Generalized weakness       Communication   Communication: No difficulties  Cognition Arousal/Alertness: Awake/alert Behavior During Therapy:  WFL for tasks assessed/performed Overall Cognitive Status: Within Functional Limits for tasks assessed                                        General Comments      Exercises     Assessment/Plan    PT Assessment Patient needs continued PT services  PT Problem List Decreased range of motion;Decreased activity tolerance;Decreased mobility       PT Treatment Interventions Functional mobility training;Therapeutic activities;Therapeutic exercise;Balance training;Patient/family education    PT Goals (Current goals can be found in the Care Plan section)  Acute Rehab PT Goals Patient  Stated Goal: I want to get better  PT Goal Formulation: With patient Time For Goal Achievement: 11/19/17 Potential to Achieve Goals: Good    Frequency Min 3X/week   Barriers to discharge        Co-evaluation               AM-PAC PT "6 Clicks" Daily Activity  Outcome Measure Difficulty turning over in bed (including adjusting bedclothes, sheets and blankets)?: Unable Difficulty moving from lying on back to sitting on the side of the bed? : Unable Difficulty sitting down on and standing up from a chair with arms (e.g., wheelchair, bedside commode, etc,.)?: Unable Help needed moving to and from a bed to chair (including a wheelchair)?: A Lot Help needed walking in hospital room?: Total Help needed climbing 3-5 steps with a railing? : Total 6 Click Score: 7    End of Session   Activity Tolerance: Patient tolerated treatment well Patient left: in chair;with call bell/phone within reach;with chair alarm set;with family/visitor present Nurse Communication: Mobility status PT Visit Diagnosis: Unsteadiness on feet (R26.81)    Time: 4580-9983 PT Time Calculation (min) (ACUTE ONLY): 39 min   Charges:   PT Evaluation $PT Eval Moderate Complexity: 1 Mod PT Treatments $Therapeutic Activity: 8-22 mins   PT G CodesClide Dales, PT Pager: [phone] 11/05/2017   Parke Jandreau, Gatha Mayer 11/05/2017, 4:15 PM

## 2017-11-05 NOTE — Progress Notes (Signed)
CSW following patient for discharge needs. Patient is from [facility] independent living facility. CSW reached out to facility and stated that if patient is needing short term rehab that hospital would have to recommend it. Facility stated they have to check to see if the have any bed available for sort term rehab in case patient may need it upon discharge. CSW is awaiting for [facility] to call back to see if the have any SNF bed available .  Rhea Pink, MSW,  [place]

## 2017-11-05 NOTE — Consult Note (Signed)
Reason for Consult: Pelvis fracture. Referring Physician: EDP  JIANNA DRABIK is an 84 y.o. female.  HPI: [place] yesterday in lot. Seen at [facility]  Past Medical History:  Diagnosis Date  . Anxiety   . Benign fundic gland polyps of stomach   . Benign gastrointestinal stromal tumor (GIST)   . Bronchitis   . Cancer ([place])    recent skin cancer left leg  excised about 8 days ago-remains with dressing intact.   . Chronic cystitis   . COLONIC POLYPS, HX OF   . Complication of anesthesia   . Diverticulosis   . DYSLIPIDEMIA   . Gastritis   . GERD (gastroesophageal reflux disease)   . Hemorrhoids   . Hiatal hernia   . HYPERTENSION   . Macular degeneration    optho q30mo- [place]  . [place]   . MITRAL VALVE PROLAPSE   . NEPHROLITHIASIS   . OSTEOARTHRITIS   . OSTEOPOROSIS   . PONV (postoperative nausea and vomiting)   . Thoracic aortic aneurysm (H[facility])    4.1 cm 2015    Past Surgical History:  Procedure Laterality Date  . ABDOMINAL HYSTERECTOMY    . APPENDECTOMY    . BREAST SURGERY    [place] . Cataract surgery  2000  . CHOLECYSTECTOMY    . COLONOSCOPY    . ESOPHAGOGASTRODUODENOSCOPY    . EUS N/A 05/16/2014   Procedure: UPPER ENDOSCOPIC ULTRASOUND (EUS) LINEAR;  Surgeon: DMilus Banister MD;  Location: [facility];  Service: Endoscopy;  Laterality: N/A;  . INSERTION OF MESH  07/04/2012   Procedure: INSERTION OF MESH;  Surgeon: TOdis Hollingshead MD;  [place]  Service: General;  Laterality: N/A;  . KNEE SURGERY    . LEG SKIN LESION  BIOPSY / EXCISION Left    8 days ago pending pathology, remains with dressing.  . SURGERY OF LIP  in 2002  . TONSILLECTOMY    . VENTRAL HERNIA REPAIR  07/04/2012   Procedure: HERNIA REPAIR VENTRAL ADULT;  Surgeon: TOdis Hollingshead MD;  [place]  Service: General;  Laterality: N/A;    Family History  Problem Relation Age of Onset  . Heart disease Other   . Heart  disease Mother   . Heart disease Father   . Emphysema Father   . Breast cancer Sister   . Lung cancer Sister   . Prostate cancer Son   . Prostate cancer Brother     Social History:  reports that  has never smoked. she has never used smokeless tobacco. She reports that she drinks alcohol. She reports that she does not use drugs.  Allergies:  Allergies  Allergen Reactions  . Pentothal [Thiopental] Nausea And Vomiting  . Codeine   . Levofloxacin   . Oxycodone-Acetaminophen   . Sulfonamide Derivatives     REACTION: GI upset/nausea/vomiting  . Tape Rash    Per patient adhesive tape    Medications: I have reviewed the patient's current medications.  Results for orders placed or performed during the hospital encounter of 11/04/17 (from the past 48 hour(s))  CBC with Differential/Platelet     Status: Abnormal   Collection Time: 11/04/17  9:38 PM  Result Value Ref Range   WBC 16.9 (H) 4.0 - 10.5 K/uL   RBC 4.45 3.87 - 5.11 MIL/uL   Hemoglobin 13.9 12.0 - 15.0 g/dL   HCT 41.7 36.0 - 46.0 %   MCV  93.7 78.0 - 100.0 fL   MCH 31.2 26.0 - 34.0 pg   MCHC 33.3 30.0 - 36.0 g/dL   RDW 13.0 11.5 - 15.5 %   Platelets 199 150 - 400 K/uL   Neutrophils Relative % 87 %   Neutro Abs 14.7 (H) 1.7 - 7.7 K/uL   Lymphocytes Relative 5 %   Lymphs Abs 0.9 0.7 - 4.0 K/uL   Monocytes Relative 8 %   Monocytes Absolute 1.3 (H) 0.1 - 1.0 K/uL   Eosinophils Relative 0 %   Eosinophils Absolute 0.0 0.0 - 0.7 K/uL   Basophils Relative 0 %   Basophils Absolute 0.0 0.0 - 0.1 K/uL    Comment: Performed at [facility], [place] [address] [place], [place] [address]  Basic metabolic panel     Status: None   Collection Time: 11/04/17  9:38 PM  Result Value Ref Range   Sodium 140 135 - 145 mmol/L   Potassium 4.0 3.5 - 5.1 mmol/L   Chloride 101 101 - 111 mmol/L   CO2 27 22 - 32 mmol/L   Glucose, Bld 89 65 - 99 mg/dL   BUN 16 6 - 20 mg/dL   Creatinine, Ser 0.50 0.44 - 1.00 mg/dL   Calcium  9.5 8.9 - 10.3 mg/dL   GFR calc non Af Amer >60 >60 mL/min   GFR calc Af Amer >60 >60 mL/min    Comment: (NOTE) The eGFR has been calculated using the CKD EPI equation. This calculation has not been validated in all clinical situations. eGFR's persistently <60 mL/min signify possible Chronic Kidney Disease.    Anion gap 12 5 - 15    Comment: Performed at [facility], [place] [address] [place], [place] [address]  Urinalysis, Routine w reflex microscopic     Status: Abnormal   Collection Time: 11/05/17  2:35 AM  Result Value Ref Range   Color, Urine AMBER (A) YELLOW    Comment: BIOCHEMICALS MAY BE AFFECTED BY COLOR   APPearance HAZY (A) CLEAR   Specific Gravity, Urine 1.021 1.005 - 1.030   pH 7.0 5.0 - 8.0   Glucose, UA NEGATIVE NEGATIVE mg/dL   Hgb urine dipstick NEGATIVE NEGATIVE   Bilirubin Urine NEGATIVE NEGATIVE   Ketones, ur 80 (A) NEGATIVE mg/dL   Protein, ur 100 (A) NEGATIVE mg/dL   Nitrite POSITIVE (A) NEGATIVE   Leukocytes, UA LARGE (A) NEGATIVE   RBC / HPF 0-5 0 - 5 RBC/hpf   WBC, UA TOO NUMEROUS TO COUNT 0 - 5 WBC/hpf   Bacteria, UA MANY (A) NONE SEEN   Squamous Epithelial / LPF NONE SEEN NONE SEEN    Comment: Performed at [facility], [place] [address], [place], [place] [address]    Mr Hip Left Wo Contrast  Result Date: 11/04/2017 CLINICAL DATA:  Left hip pain due to a fall today. Initial encounter. EXAM: MR OF THE LEFT HIP WITHOUT CONTRAST TECHNIQUE: Multiplanar, multisequence MR imaging was performed. No intravenous contrast was administered. COMPARISON:  Plain films left hip today. CT abdomen and pelvis 11/28/2012. FINDINGS: Bones: There is marrow edema in the left sacrum due to a nondisplaced fracture. As seen on the comparison plain films, there are also acute fractures of the left superior and inferior pubic rami. No other fracture is identified. Specifically, there is no hip fracture. Degenerative endplate signal change L8-V5 is  noted. No avascular necrosis of the femoral heads. No evidence of neoplastic process. Articular cartilage and labrum Articular cartilage:  Mildly degenerated. Labrum:  Appears intact. Joint or bursal effusion Joint effusion:  None. Bursae: Negative. Muscles and tendons Muscles and tendons: There is marked edema in the left obturator externus, obturator internus and adductor brevis muscles consistent with strain and/or contusion related to the patient's fall. No tear is identified. Other findings Miscellaneous: Imaged intrapelvic contents demonstrate sigmoid diverticulosis without diverticulitis. The patient is status post hysterectomy. IMPRESSION: The exam is positive for acute fractures of the left sacrum and left superior and inferior pubic rami. Negative for hip fracture. Edema in the left obturator externus, obturator internus and adductor brevis muscles consistent with strain and/or contusion related to the patient's fall. No muscle tear. Sigmoid diverticulosis. Electronically Signed   By: Inge Rise M.D.   On: 11/04/2017 21:20   Dg Hip Unilat W Or Wo Pelvis 2-3 Views Left  Result Date: 11/04/2017 CLINICAL DATA:  Fall. EXAM: DG HIP (WITH OR WITHOUT PELVIS) 2-3V LEFT COMPARISON:  CT abdomen pelvis dated April 24, 2012. FINDINGS: Acute, mildly displaced fractures of the left superior and inferior pubic rami. Old nondisplaced fracture of the right inferior pubic ramus. No definite femur fracture. The pubic symphysis and sacroiliac joints are intact. Mild bilateral hip joint space narrowing. Osteopenia. Soft tissues are unremarkable. IMPRESSION: 1. Acute, mildly displaced fractures of the left superior and inferior pubic rami. 2. No definite femur fracture. If occult hip fracture is suspected or if the patient is unable to bear weight, MRI is the preferred modality for further evaluation. Electronically Signed   By: Titus Dubin M.D.   On: 11/04/2017 16:55    Review of Systems  Musculoskeletal:  Positive for joint pain.  All other systems reviewed and are negative.  Blood pressure 129/72, pulse 73, temperature 98.5 F (36.9 C), temperature source Oral, resp. rate 16, height 5' 4"  (1.626 m), weight 56.7 kg (125 lb), SpO2 97 %. Physical Exam  Constitutional: She is oriented to person, place, and time. She appears well-developed.  HENT:  Head: Normocephalic.  Eyes: Pupils are equal, round, and reactive to light.  Neck: Normal range of motion.  Cardiovascular: Normal rate.  Respiratory: Effort normal.  GI: Soft.  Musculoskeletal:  Pain with rotation of left hip. NVI. No DVT. Splint intact left arm. NVI. Minimal swelling.  Neurological: She is alert and oriented to person, place, and time.  Skin: Skin is warm and dry.  Psychiatric: She has a normal mood and affect.    Assessment/Plan:  Closed stable fractures of pubic ramus superior and inferior  left and sacral fracture posterior.  No hip fracture.  Left nondisplaced olecranon fracture. Non weight bearing left arm. May not be able to weight bear for a while. Bed to chair for now. Follow up in 2 weeks for splint change left arm. Xray of pelvis in two weeks. Ok to weight bear on left leg to pivot as tolerated though probably wont tolerate for a week or two.   Amarii Bordas C 11/05/2017, 7:58 AM

## 2017-11-05 NOTE — Progress Notes (Signed)
Chelsea Jordan  is a 83 y.o. female, with past medical history significant for hypertension and meningioma who fell in the parking lot on her left side. Imaging shows, displaced fractures of the pubic rami and olecranon fracture on the left side.  Orthopedics consulted and recommended non weight bearing left arm. Follow up with x rays in 2 weeks and for splint change.  Okay fo weight bearing on the left leg.    Hosie Poisson, MD [phone]

## 2017-11-06 DIAGNOSIS — N390 Urinary tract infection, site not specified: Secondary | ICD-10-CM | POA: Diagnosis present

## 2017-11-06 DIAGNOSIS — S52022A Displaced fracture of olecranon process without intraarticular extension of left ulna, initial encounter for closed fracture: Secondary | ICD-10-CM | POA: Diagnosis not present

## 2017-11-06 DIAGNOSIS — S32592A Other specified fracture of left pubis, initial encounter for closed fracture: Secondary | ICD-10-CM | POA: Diagnosis not present

## 2017-11-06 LAB — CBC WITH DIFFERENTIAL/PLATELET
Basophils Absolute: 0 10*3/uL (ref 0.0–0.1)
Basophils Relative: 0 %
EOS PCT: 3 %
Eosinophils Absolute: 0.3 10*3/uL (ref 0.0–0.7)
HCT: 36.7 % (ref 36.0–46.0)
HEMOGLOBIN: 12.3 g/dL (ref 12.0–15.0)
LYMPHS ABS: 1 10*3/uL (ref 0.7–4.0)
LYMPHS PCT: 11 %
MCH: 31 pg (ref 26.0–34.0)
MCHC: 33.5 g/dL (ref 30.0–36.0)
MCV: 92.4 fL (ref 78.0–100.0)
Monocytes Absolute: 1 10*3/uL (ref 0.1–1.0)
Monocytes Relative: 11 %
Neutro Abs: 7 10*3/uL (ref 1.7–7.7)
Neutrophils Relative %: 75 %
PLATELETS: 152 10*3/uL (ref 150–400)
RBC: 3.97 MIL/uL (ref 3.87–5.11)
RDW: 12.8 % (ref 11.5–15.5)
WBC: 9.3 10*3/uL (ref 4.0–10.5)

## 2017-11-06 MED ORDER — CEPHALEXIN 500 MG PO CAPS: 500.0000 mg | ORAL_CAPSULE | Freq: Two times a day (BID) | ORAL | 0 refills | Status: DC

## 2017-11-06 NOTE — Discharge Summary (Signed)
Physician Discharge Summary  KAELIN HOLFORD ZOX:[id number] DOB: [birth year] DOA: 11/04/2017  PCP: Biagio Borg, MD  Admit date: 11/04/2017 Discharge date: 11/06/2017  Admitted From: [facility].  Disposition:  SNF.  Recommendations for Outpatient Follow-up:  1. Follow up with PCP in 1-2 weeks 2. Please obtain BMP/CBC in one week 3. Please follow up with orthopedics as recommended in 2 weeks.     Discharge Condition:stable.  CODE STATUS: full code.  Diet recommendation: Heart Healthy   Brief/Interim Summary:  JaneGarmanis a68 y.o.female,with past medical history significant for hypertension and meningioma who fell in the parking lot on her left side. Imaging shows, displaced fractures of the pubic rami and olecranon fracture on the left side. No hip fracture. She was also found to have left non displaced olecranon fracture. Non weight bearing on the left arm. Follow up with orthopedics in 2 weeks for a splint change in the left arm. Plan for weight bearing on the left leg  Orthopedics consulted and recommended non weight bearing left arm. Follow up with x rays in 2 weeks and for splint change.  Okay for weight bearing on the left leg.     Discharge Diagnoses:  Active Problems:   Olecranon fracture, left, closed, initial encounter  Olecranon fracture of the left arm:  Non weight bearing on the left. Pain control.  Orthopedics consulted and a splint placed and plan for  2 weeks follow up.    Pubic Rami Fracture:  Weight bearing as tolerated.  Physical therapy as needed.     Hypertension:  Well controlled. Resume home medications.   Urinary Tract infection:  On keflex to complete the course. Unfortunately urine cultures were not obtained on admission.   Discharge Instructions  Discharge Instructions    Diet - low sodium heart healthy   Complete by:  As directed    Discharge instructions   Complete by:  As directed    please follow up with orthopedics as  recommended.   Increase activity slowly   Complete by:  As directed      Allergies as of 11/06/2017      Reactions   Pentothal [thiopental] Nausea And Vomiting   Codeine    Levofloxacin    Oxycodone-acetaminophen    Sulfonamide Derivatives    REACTION: GI upset/nausea/vomiting   Tape Rash   Per patient adhesive tape      Medication List    STOP taking these medications   amoxicillin 500 MG tablet Commonly known as:  AMOXIL     TAKE these medications   aspirin 81 MG tablet Take 81 mg by mouth at bedtime.   CALCIUM 500+D 500-200 MG-UNIT tablet Generic drug:  calcium-vitamin D Take 1 tablet by mouth daily.   cephALEXin 500 MG capsule Commonly known as:  KEFLEX Take 1 capsule (500 mg total) by mouth every 12 (twelve) hours.   esomeprazole 40 MG capsule Commonly known as:  NEXIUM TAKE 1 CAPSULE BY MOUTH 30-60 MIN BEFORE YOUR FIRST AND LAST MEAL OF THE DAY   losartan 50 MG tablet Commonly known as:  COZAAR TAKE 1 TABLET BY MOUTH EVERY MORNING   Lutein-Zeaxanthin 20-1 MG Caps Take 1 capsule by mouth daily.   PRESERVISION AREDS 2 PO Take 1 tablet by mouth daily.   simvastatin 5 MG tablet Commonly known as:  ZOCOR TAKE 1 TABLET BY MOUTH AT BEDTIME.   vitamin C 500 MG tablet Commonly known as:  ASCORBIC ACID Take 500 mg by mouth daily.   Vitamin  D3 2000 units Tabs Take 2,000 Units by mouth daily.   vitamin E 400 UNIT capsule Take 400 Units by mouth every morning.       Contact information for follow-up providers    Biagio Borg, MD. Schedule an appointment as soon as possible for a visit in 1 week(s).   Specialties:  Internal Medicine, Radiology Contact information: [place] [address] [phone]        Meredith Pel, MD. Schedule an appointment as soon as possible for a visit in 2 week(s).   Specialty:  Orthopedic Surgery Contact information: [place] [address] [phone]             Contact information for after-discharge care    Destination    [facility] Follow up.   Service:  Skilled Nursing Contact information: [address] [address] [phone]                 Allergies  Allergen Reactions  . Pentothal [Thiopental] Nausea And Vomiting  . Codeine   . Levofloxacin   . Oxycodone-Acetaminophen   . Sulfonamide Derivatives     REACTION: GI upset/nausea/vomiting  . Tape Rash    Per patient adhesive tape    Consultations:  Orthopedics.    Procedures/Studies: Mr Hip Left Wo Contrast  Result Date: 11/04/2017 CLINICAL DATA:  Left hip pain due to a fall today. Initial encounter. EXAM: MR OF THE LEFT HIP WITHOUT CONTRAST TECHNIQUE: Multiplanar, multisequence MR imaging was performed. No intravenous contrast was administered. COMPARISON:  Plain films left hip today. CT abdomen and pelvis 11/28/2012. FINDINGS: Bones: There is marrow edema in the left sacrum due to a nondisplaced fracture. As seen on the comparison plain films, there are also acute fractures of the left superior and inferior pubic rami. No other fracture is identified. Specifically, there is no hip fracture. Degenerative endplate signal change B0-F7 is noted. No avascular necrosis of the femoral heads. No evidence of neoplastic process. Articular cartilage and labrum Articular cartilage:  Mildly degenerated. Labrum:  Appears intact. Joint or bursal effusion Joint effusion:  None. Bursae: Negative. Muscles and tendons Muscles and tendons: There is marked edema in the left obturator externus, obturator internus and adductor brevis muscles consistent with strain and/or contusion related to the patient's fall. No tear is identified. Other findings Miscellaneous: Imaged intrapelvic contents demonstrate sigmoid diverticulosis without diverticulitis. The patient is status post hysterectomy. IMPRESSION: The exam is positive for acute fractures of the left  sacrum and left superior and inferior pubic rami. Negative for hip fracture. Edema in the left obturator externus, obturator internus and adductor brevis muscles consistent with strain and/or contusion related to the patient's fall. No muscle tear. Sigmoid diverticulosis. Electronically Signed   By: Inge Rise M.D.   On: 11/04/2017 21:20   Dg Hip Unilat W Or Wo Pelvis 2-3 Views Left  Result Date: 11/04/2017 CLINICAL DATA:  Fall. EXAM: DG HIP (WITH OR WITHOUT PELVIS) 2-3V LEFT COMPARISON:  CT abdomen pelvis dated April 24, 2012. FINDINGS: Acute, mildly displaced fractures of the left superior and inferior pubic rami. Old nondisplaced fracture of the right inferior pubic ramus. No definite femur fracture. The pubic symphysis and sacroiliac joints are intact. Mild bilateral hip joint space narrowing. Osteopenia. Soft tissues are unremarkable. IMPRESSION: 1. Acute, mildly displaced fractures of the left superior and inferior pubic rami. 2. No definite femur fracture. If occult hip fracture is suspected or  if the patient is unable to bear weight, MRI is the preferred modality for further evaluation. Electronically Signed   By: Titus Dubin M.D.   On: 11/04/2017 16:55       Subjective:  No new complaints.   Discharge Exam: Vitals:   11/05/17 2057 11/06/17 0437  BP: 140/80 (!) 150/82  Pulse: 75 72  Resp: 15 16  Temp: 98.4 F (36.9 C) 98.1 F (36.7 C)  SpO2: 96% 93%   Vitals:   11/05/17 0648 11/05/17 1356 11/05/17 2057 11/06/17 0437  BP: 129/72 109/69 140/80 (!) 150/82  Pulse: 73 86 75 72  Resp: 16 17 15 16   Temp: 98.5 F (36.9 C) 99.1 F (37.3 C) 98.4 F (36.9 C) 98.1 F (36.7 C)  TempSrc: Oral Oral Axillary Oral  SpO2: 97% 97% 96% 93%  Weight:      Height:        General: Pt is alert, awake, not in acute distress Cardiovascular: RRR, S1/S2 +, no rubs, no gallops Respiratory: CTA bilaterally, no wheezing, no rhonchi Abdominal: Soft, NT, ND, bowel sounds + Extremities:  no edema, no cyanosis    The results of significant diagnostics from this hospitalization (including imaging, microbiology, ancillary and laboratory) are listed below for reference.     Microbiology: No results found for this or any previous visit (from the past 240 hour(s)).   Labs: BNP (last 3 results) No results for input(s): BNP in the last 8760 hours. Basic Metabolic Panel: Recent Labs  Lab 11/04/17 2138  NA 140  K 4.0  CL 101  CO2 27  GLUCOSE 89  BUN 16  CREATININE 0.50  CALCIUM 9.5   Liver Function Tests: No results for input(s): AST, ALT, ALKPHOS, BILITOT, PROT, ALBUMIN in the last 168 hours. No results for input(s): LIPASE, AMYLASE in the last 168 hours. No results for input(s): AMMONIA in the last 168 hours. CBC: Recent Labs  Lab 11/04/17 2138 11/06/17 0924  WBC 16.9* 9.3  NEUTROABS 14.7* 7.0  HGB 13.9 12.3  HCT 41.7 36.7  MCV 93.7 92.4  PLT 199 152   Cardiac Enzymes: No results for input(s): CKTOTAL, CKMB, CKMBINDEX, TROPONINI in the last 168 hours. BNP: Invalid input(s): POCBNP CBG: No results for input(s): GLUCAP in the last 168 hours. D-Dimer No results for input(s): DDIMER in the last 72 hours. Hgb A1c No results for input(s): HGBA1C in the last 72 hours. Lipid Profile No results for input(s): CHOL, HDL, LDLCALC, TRIG, CHOLHDL, LDLDIRECT in the last 72 hours. Thyroid function studies No results for input(s): TSH, T4TOTAL, T3FREE, THYROIDAB in the last 72 hours.  Invalid input(s): FREET3 Anemia work up No results for input(s): VITAMINB12, FOLATE, FERRITIN, TIBC, IRON, RETICCTPCT in the last 72 hours. Urinalysis    Component Value Date/Time   COLORURINE AMBER (A) 11/05/2017 0235   APPEARANCEUR HAZY (A) 11/05/2017 0235   LABSPEC 1.021 11/05/2017 0235   PHURINE 7.0 11/05/2017 0235   GLUCOSEU NEGATIVE 11/05/2017 0235   GLUCOSEU NEGATIVE 02/09/2017 1530   HGBUR NEGATIVE 11/05/2017 0235   BILIRUBINUR NEGATIVE 11/05/2017 0235   BILIRUBINUR  negative 07/12/2014 0851   KETONESUR 80 (A) 11/05/2017 0235   PROTEINUR 100 (A) 11/05/2017 0235   UROBILINOGEN 1.0 02/09/2017 1530   NITRITE POSITIVE (A) 11/05/2017 0235   LEUKOCYTESUR LARGE (A) 11/05/2017 0235   Sepsis Labs Invalid input(s): PROCALCITONIN,  WBC,  LACTICIDVEN Microbiology No results found for this or any previous visit (from the past 240 hour(s)).   Time coordinating discharge: Over 30 minutes  SIGNED:   Hosie Poisson, MD  [facility] 11/06/2017, 11:00 AM Pager   If 7PM-7AM, please contact night-coverage [email or web address] Password [email or web address]

## 2017-11-06 NOTE — Progress Notes (Signed)
Patient will discharge to [facility] Anticipated discharge date: 3/3 Family notified: pt dtr [name] by [employer]- called at 11:30am  CSW signing off.  Jorge Ny, LCSW Clinical Social Worker [phone]

## 2017-11-06 NOTE — Clinical Social Work Placement (Signed)
   CLINICAL SOCIAL WORK PLACEMENT  NOTE  Date:  11/06/2017  Patient Details  Name: Chelsea Jordan MRN: [id number] Date of Birth: May 06, 1927  Clinical Social Work is seeking post-discharge placement for this patient at the [place] level of care (*CSW will initial, date and re-position this form in  chart as items are completed):  Yes   Patient/family provided with [facility] Work Department's list of facilities offering this level of care within the geographic area requested by the patient (or if unable, by the patient's family).  Yes   Patient/family informed of their freedom to choose among providers that offer the needed level of care, that participate in Medicare, Medicaid or managed care program needed by the patient, have an available bed and are willing to accept the patient.  Yes   Patient/family informed of [facility]'s ownership interest in [facility] and [facility], as well as of the fact that they are under no obligation to receive care at these facilities.  PASRR submitted to EDS on 11/05/17     PASRR number received on 11/05/17     Existing PASRR number confirmed on       FL2 transmitted to all facilities in geographic area requested by pt/family on 11/05/17     FL2 transmitted to all facilities within larger geographic area on       Patient informed that his/her managed care company has contracts with or will negotiate with certain facilities, including the following:        Yes   Patient/family informed of bed offers received.  Patient chooses bed at [facility]     Physician recommends and patient chooses bed at      Patient to be transferred to [facility] on 11/06/17.  Patient to be transferred to facility by ptar     Patient family notified on 11/06/17 of transfer.  Name of family member notified:  pam     PHYSICIAN Please sign FL2     Additional Comment:     _______________________________________________ Jorge Ny, LCSW 11/06/2017, 11:30 AM

## 2017-11-06 NOTE — Progress Notes (Signed)
[facility] called to [facility]. Report given to [facility] at facility. Bethann Punches RN

## 2017-11-06 NOTE — Care Management Obs Status (Signed)
MEDICARE OBSERVATION STATUS NOTIFICATION   Patient Details  Name: Chelsea Jordan MRN: [id number] Date of Birth: 1927-01-19   Medicare Observation Status Notification Given:  Yes    Erenest Rasher, RN 11/06/2017, 11:27 AM

## 2017-11-06 NOTE — Progress Notes (Signed)
CSW following for patient transfer to [facility]  CSW called [facility] and spoke with administrator- they are unsure if they can admit patient today due to it being the weekend- CSW faxed all requested clinicals to help them make decision  Awaiting return call from nursing director with determination  Jorge Ny, [place] Social Worker [phone]

## 2017-11-07 ENCOUNTER — Non-Acute Institutional Stay (SKILLED_NURSING_FACILITY): Payer: Medicare Other | Admitting: Internal Medicine

## 2017-11-07 ENCOUNTER — Telehealth: Payer: Self-pay | Admitting: *Deleted

## 2017-11-07 ENCOUNTER — Encounter: Payer: Self-pay | Admitting: Internal Medicine

## 2017-11-07 DIAGNOSIS — E785 Hyperlipidemia, unspecified: Secondary | ICD-10-CM

## 2017-11-07 DIAGNOSIS — S32592S Other specified fracture of left pubis, sequela: Secondary | ICD-10-CM

## 2017-11-07 DIAGNOSIS — I10 Essential (primary) hypertension: Secondary | ICD-10-CM | POA: Diagnosis not present

## 2017-11-07 DIAGNOSIS — S52022D Displaced fracture of olecranon process without intraarticular extension of left ulna, subsequent encounter for closed fracture with routine healing: Secondary | ICD-10-CM | POA: Diagnosis not present

## 2017-11-07 DIAGNOSIS — S322XXS Fracture of coccyx, sequela: Secondary | ICD-10-CM | POA: Diagnosis not present

## 2017-11-07 DIAGNOSIS — R4189 Other symptoms and signs involving cognitive functions and awareness: Secondary | ICD-10-CM | POA: Diagnosis not present

## 2017-11-07 DIAGNOSIS — K219 Gastro-esophageal reflux disease without esophagitis: Secondary | ICD-10-CM | POA: Diagnosis not present

## 2017-11-07 DIAGNOSIS — N3000 Acute cystitis without hematuria: Secondary | ICD-10-CM

## 2017-11-07 DIAGNOSIS — R2681 Unsteadiness on feet: Secondary | ICD-10-CM | POA: Diagnosis not present

## 2017-11-07 DIAGNOSIS — S3210XS Unspecified fracture of sacrum, sequela: Secondary | ICD-10-CM | POA: Diagnosis not present

## 2017-11-07 NOTE — Progress Notes (Signed)
Provider:  Blanchie Serve MD  Location:  [facility] Room Number: 81 Place of Service:  SNF ([phone])  PCP: Biagio Borg, MD Patient Care Team: Biagio Borg, MD as PCP - General (Internal Medicine) Minus Breeding, MD as Referring Physician (Cardiology) Tanda Rockers, MD as Referring Physician (Pulmonary Disease) Gatha Mayer, MD as Referring Physician (Gastroenterology) Gaynelle Arabian, MD as Referring Physician (Orthopedic Surgery) Melida Quitter, MD as Referring Physician Jackolyn Confer, MD (General Surgery)  Extended Emergency Contact Information Primary Emergency Contact: [facility] Address: [place], [place] [address] [name] of [place] Phone: [phone] Mobile Phone: [phone] Relation: Spouse Secondary Emergency Contact: Marcina Millard of [place] Phone: [phone] Mobile Phone: [phone] Relation: Son  Code Status: full code   Chief Complaint  Patient presents with  . New Admit To SNF    New Admission Visit     HPI: Patient is a 84 y.o. female seen today for admission visit. She was in the hospital from 11/04/17-11/06/17 post fall at a parking lot with non displaced fracture of left olecranon and displaced fracture of left pubic rami. She was seen by orthopedic. She has a splint to her left arm and is supposed to be non weight bearing to left upper extremity until follow up with orthopedic. She has medical history of hypertension, osteoarthritis and meningioma. She is seen in her room today with her husband present. She complaints of low back and left leg hurting.   Past Medical History:  Diagnosis Date  . Anxiety   . Benign fundic gland polyps of stomach   . Benign gastrointestinal stromal tumor (GIST)   . Bronchitis   . Cancer ([place])    recent skin cancer left leg  excised about 8 days ago-remains with dressing intact.   . Chronic cystitis   . COLONIC POLYPS, HX OF   .  Complication of anesthesia   . Diverticulosis   . DYSLIPIDEMIA   . Gastritis   . GERD (gastroesophageal reflux disease)   . Hemorrhoids   . Hiatal hernia   . HYPERTENSION   . Macular degeneration    optho q41mo - [facility]  . Meningioma ([place])   . MITRAL VALVE PROLAPSE   . NEPHROLITHIASIS   . OSTEOARTHRITIS   . OSTEOPOROSIS   . PONV (postoperative nausea and vomiting)   . Thoracic aortic aneurysm (H[facility])    4.1 cm 2015   Past Surgical History:  Procedure Laterality Date  . ABDOMINAL HYSTERECTOMY    . APPENDECTOMY    . BREAST SURGERY    . [place]   no PCI, performed in [place]  . Cataract surgery  2000  . CHOLECYSTECTOMY    . COLONOSCOPY    . ESOPHAGOGASTRODUODENOSCOPY    . EUS N/A 05/16/2014   Procedure: UPPER ENDOSCOPIC ULTRASOUND (EUS) LINEAR;  Surgeon: Milus Banister, MD;  Location[facility];  Service: Endoscopy;  Laterality: N/A;  . INSERTION OF MESH  07/04/2012   Procedure: INSERTION OF MESH;  Surgeon: Odis Hollingshead, MD;  Location: [facility];  Service: General;  Laterality: N/A;  . KNEE SURGERY    . LEG SKIN LESION  BIOPSY / EXCISION Left    8 days ago pending pathology, remains with dressing.  . SURGERY OF LIP  in 2002  . TONSILLECTOMY    . VENTRAL HERNIA REPAIR  07/04/2012   Procedure: HERNIA REPAIR VENTRAL ADULT;  Surgeon: Odis Hollingshead, MD;  Location: [facility];  Service: General;  Laterality: N/A;    reports that  has never smoked. she has never used smokeless tobacco. She reports that she drinks alcohol. She reports that she does not use drugs. Social History   Socioeconomic History  . Marital status: Married    Spouse name: Not on file  . Number of children: 2  . Years of education: Not on file  . Highest education level: Not on file  Social Needs  . Financial resource strain: Not on file  . Food insecurity - worry: Not on file  . Food insecurity - inability: Not on file  . Transportation needs - medical: Not on file  .  Transportation needs - non-medical: Not on file  Occupational History  . Occupation: retired  Tobacco Use  . Smoking status: Never Smoker  . Smokeless tobacco: Never Used  . Tobacco comment: Married, lives with spouse, Enjoys golf. Retired  Substance and Sexual Activity  . Alcohol use: Yes    Alcohol/week: 0.0 oz    Comment: Wine -nightly  . Drug use: No  . Sexual activity: Not Currently  Other Topics Concern  . Not on file  Social History Narrative   Married, lives with spouse - enjoys golf   Lives at [place] Status Survey:    Family History  Problem Relation Age of Onset  . Heart disease Other   . Heart disease Mother   . Heart disease Father   . Emphysema Father   . Breast cancer Sister   . Lung cancer Sister   . Prostate cancer Son   . Prostate cancer Brother     Health Maintenance  Topic Date Due  . Samul Dada  11/04/2025  . INFLUENZA VACCINE  Completed  . DEXA SCAN  Completed  . PNA vac Low Risk Adult  Completed    Allergies  Allergen Reactions  . Pentothal [Thiopental] Nausea And Vomiting  . Codeine   . Levofloxacin   . Oxycodone-Acetaminophen   . Sulfonamide Derivatives     REACTION: GI upset/nausea/vomiting  . Tape Rash    Per patient adhesive tape    Outpatient Encounter Medications as of 11/07/2017  Medication Sig  . acetaminophen (TYLENOL) 325 MG tablet Take 650 mg by mouth every 4 (four) hours as needed.  . Ascorbic Acid (VITAMIN C) 500 MG tablet Take 500 mg by mouth daily.    Marland Kitchen aspirin 81 MG tablet Take 81 mg by mouth at bedtime.   . calcium-vitamin D (CALCIUM 500+D) 500-200 MG-UNIT per tablet Take 1 tablet by mouth daily.    . cephALEXin (KEFLEX) 500 MG capsule Take 1 capsule (500 mg total) by mouth every 12 (twelve) hours.  . Cholecalciferol (VITAMIN D3) 2000 UNITS TABS Take 2,000 Units by mouth daily.    Marland Kitchen esomeprazole (NEXIUM) 40 MG capsule TAKE 1 CAPSULE BY MOUTH 30-60 MIN BEFORE YOUR FIRST AND LAST MEAL OF THE DAY    . losartan (COZAAR) 50 MG tablet TAKE 1 TABLET BY MOUTH EVERY MORNING  . Lutein-Zeaxanthin 20-1 MG CAPS Take 1 capsule by mouth daily.  . Multiple Vitamins-Minerals (PRESERVISION AREDS 2 PO) Take 1 tablet by mouth daily.  . simvastatin (ZOCOR) 5 MG tablet TAKE 1 TABLET BY MOUTH AT BEDTIME.  . vitamin E 400 UNIT capsule Take 400 Units by mouth every morning.   No facility-administered encounter medications on file as of 11/07/2017.     Review of Systems  Constitutional: Positive for fatigue. Negative for appetite change, chills and fever.  HENT: Positive for hearing loss. Negative for congestion, ear pain, mouth sores, rhinorrhea, sinus pressure, sore throat, tinnitus and trouble swallowing.        Does not wear her hearing aid  Respiratory: Negative for cough and shortness of breath.   Cardiovascular: Negative for chest pain, palpitations and leg swelling.  Gastrointestinal: Negative for abdominal pain, constipation, diarrhea, nausea and vomiting.       Had a bowel movement this am, regular stool  Genitourinary: Positive for pelvic pain. Negative for dysuria, frequency and hematuria.  Musculoskeletal: Positive for arthralgias, back pain and gait problem.  Skin: Negative for rash.  Neurological: Positive for light-headedness. Negative for seizures, syncope, numbness and headaches.  Psychiatric/Behavioral: Positive for confusion and decreased concentration. Negative for behavioral problems, dysphoric mood and hallucinations.    Vitals:   11/07/17 0942  BP: 126/81  Pulse: 74  Resp: 18  Temp: 98.4 F (36.9 C)  TempSrc: Oral  SpO2: 92%  Height: 5\' 4"  (1.626 m)   Body mass index is 21.46 kg/m. Physical Exam  Constitutional:  Thin built, frail, elderly female , in no acute distress  HENT:  Head: Normocephalic and atraumatic.  Right Ear: External ear normal.  Left Ear: External ear normal.  Nose: Nose normal.  Mouth/Throat: Oropharynx is clear and moist. No oropharyngeal exudate.   Eyes: Conjunctivae and EOM are normal. Pupils are equal, round, and reactive to light. Right eye exhibits no discharge. Left eye exhibits no discharge.  Neck: Normal range of motion. Neck supple.  Cardiovascular: Normal rate, regular rhythm and intact distal pulses.  Pulmonary/Chest: Effort normal and breath sounds normal. No respiratory distress. She has no wheezes. She has no rales. She exhibits no tenderness.  Abdominal: Soft. Bowel sounds are normal. There is no tenderness.  Musculoskeletal: She exhibits no edema.  Limited ROM with left elbow, able to move at shoulders with limited ROM, pain with movement of left leg at groin and hip area, sling placed to left arm. No spinal tenderness.  Lymphadenopathy:    She has no cervical adenopathy.  Neurological: She is alert.  Oriented to person, place, month but not to year, repeats her self, can be redirected, poor safety awareness  Skin: Skin is warm and dry. No rash noted. She is not diaphoretic.  Chronic skin changes to lower extremities  Psychiatric: She has a normal mood and affect.    Labs reviewed: Basic Metabolic Panel: Recent Labs    02/09/17 1530 11/04/17 2138  NA 138 140  K 4.0 4.0  CL 102 101  CO2 30 27  GLUCOSE 98 89  BUN 16 16  CREATININE 0.55 0.50  CALCIUM 9.7 9.5   Liver Function Tests: Recent Labs    02/09/17 1530  AST 18  ALT 14  ALKPHOS 82  BILITOT 0.5  PROT 6.5  ALBUMIN 4.2   No results for input(s): LIPASE, AMYLASE in the last 8760 hours. No results for input(s): AMMONIA in the last 8760 hours. CBC: Recent Labs    02/09/17 1530 11/04/17 2138 11/06/17 0924  WBC 7.4 16.9* 9.3  NEUTROABS 4.4 14.7* 7.0  HGB 13.9 13.9 12.3  HCT 41.9 41.7 36.7  MCV 91.5 93.7 92.4  PLT 194.0 199 152   Cardiac Enzymes: No results for input(s): CKTOTAL, CKMB, CKMBINDEX, TROPONINI in the last 8760 hours. BNP: Invalid input(s): POCBNP No results found for: HGBA1C Lab Results  Component Value Date   TSH 2.41  02/09/2017  Lab Results  Component Value Date   [id number] 458 02/09/2017   No results found for: FOLATE No results found for: IRON, TIBC, FERRITIN  Imaging and Procedures obtained prior to SNF admission: Mr Hip Left Wo Contrast  Result Date: 11/04/2017 CLINICAL DATA:  Left hip pain due to a fall today. Initial encounter. EXAM: MR OF THE LEFT HIP WITHOUT CONTRAST TECHNIQUE: Multiplanar, multisequence MR imaging was performed. No intravenous contrast was administered. COMPARISON:  Plain films left hip today. CT abdomen and pelvis 11/28/2012. FINDINGS: Bones: There is marrow edema in the left sacrum due to a nondisplaced fracture. As seen on the comparison plain films, there are also acute fractures of the left superior and inferior pubic rami. No other fracture is identified. Specifically, there is no hip fracture. Degenerative endplate signal change K9-X8 is noted. No avascular necrosis of the femoral heads. No evidence of neoplastic process. Articular cartilage and labrum Articular cartilage:  Mildly degenerated. Labrum:  Appears intact. Joint or bursal effusion Joint effusion:  None. Bursae: Negative. Muscles and tendons Muscles and tendons: There is marked edema in the left obturator externus, obturator internus and adductor brevis muscles consistent with strain and/or contusion related to the patient's fall. No tear is identified. Other findings Miscellaneous: Imaged intrapelvic contents demonstrate sigmoid diverticulosis without diverticulitis. The patient is status post hysterectomy. IMPRESSION: The exam is positive for acute fractures of the left sacrum and left superior and inferior pubic rami. Negative for hip fracture. Edema in the left obturator externus, obturator internus and adductor brevis muscles consistent with strain and/or contusion related to the patient's fall. No muscle tear. Sigmoid diverticulosis. Electronically Signed   By: Inge Rise M.D.   On: 11/04/2017 21:20   Dg Hip  Unilat W Or Wo Pelvis 2-3 Views Left  Result Date: 11/04/2017 CLINICAL DATA:  Fall. EXAM: DG HIP (WITH OR WITHOUT PELVIS) 2-3V LEFT COMPARISON:  CT abdomen pelvis dated April 24, 2012. FINDINGS: Acute, mildly displaced fractures of the left superior and inferior pubic rami. Old nondisplaced fracture of the right inferior pubic ramus. No definite femur fracture. The pubic symphysis and sacroiliac joints are intact. Mild bilateral hip joint space narrowing. Osteopenia. Soft tissues are unremarkable. IMPRESSION: 1. Acute, mildly displaced fractures of the left superior and inferior pubic rami. 2. No definite femur fracture. If occult hip fracture is suspected or if the patient is unable to bear weight, MRI is the preferred modality for further evaluation. Electronically Signed   By: Titus Dubin M.D.   On: 11/04/2017 16:55    Assessment/Plan  Unsteady gait Will have her work with physical therapy and occupational therapy team to help with gait training and muscle strengthening exercises.fall precautions. Skin care. Encourage to be out of bed.   Cognitive impairment Poor safety awareness, needs frequent redirection. SLP consult to assess and treat  Left pubic rami fracture WBAT to LLE for now. Add Tylenol 500 mg 1-2 tab q6h as needed for pain for now. Will have patient work with PT/OT as tolerated to regain strength and restore function.  Fall precautions are in place. Continue vitamin D supplement with calcium.   Left sacrum fracture Pain management and therapy as above.   Left olecranon fracture Has a splint and sling in place. NWB to LUE for now. Pain management and therapy as above.   UTI Currently denies any symptom. Encouraged hydration. Continue and complete keflex 500 mg q12 hours until 11/10/17. Add florastor to prevent antibiotic diarrhea.   Essential hypertension Continue aspirin and losartan,  monitor BP readings, reviewed labs from hospital, check CMP in 1 week.    Hyperlipidemia Continue simvastatin 5 mg daily.  GERD Continue esomeprazole 40 mg daily.    Family/ staff Communication: reviewed care plan with patient, her husband and charge nurse.    Labs/tests ordered: cbc, cmp  Blanchie Serve, MD Internal Medicine [facility] [address] [place],  [address] Cell Phone (Monday-Friday 8 am - 5 pm): [phone] On Call: [phone] and follow prompts after 5 pm and on weekends Office Phone: [phone] Office Fax: [phone]

## 2017-11-07 NOTE — Telephone Encounter (Signed)
Pt was on TCM report admitted 11/04/17 who fell in the parking lot on her left side. Imaging shows, displaced fractures of the pubic rami and olecranon fracture on the left side. No hip fracture. She was also found to have left non displaced olecranon fracture. Pt D/C 11/06/17 to [facility] SN[facility]/ALF. Per d/c summary will need to follow-up w/PCP 1 week after d.c from SNF.Marland KitchenJohny Chess

## 2017-11-23 ENCOUNTER — Encounter (INDEPENDENT_AMBULATORY_CARE_PROVIDER_SITE_OTHER): Payer: Self-pay | Admitting: Orthopedic Surgery

## 2017-11-23 ENCOUNTER — Ambulatory Visit (INDEPENDENT_AMBULATORY_CARE_PROVIDER_SITE_OTHER): Payer: Medicare Other

## 2017-11-23 ENCOUNTER — Ambulatory Visit (INDEPENDENT_AMBULATORY_CARE_PROVIDER_SITE_OTHER): Payer: Medicare Other | Admitting: Orthopedic Surgery

## 2017-11-23 DIAGNOSIS — M25522 Pain in left elbow: Secondary | ICD-10-CM | POA: Diagnosis not present

## 2017-11-23 DIAGNOSIS — S3210XA Unspecified fracture of sacrum, initial encounter for closed fracture: Secondary | ICD-10-CM | POA: Diagnosis not present

## 2017-11-23 DIAGNOSIS — R102 Pelvic and perineal pain: Secondary | ICD-10-CM

## 2017-11-23 NOTE — Progress Notes (Signed)
Office Visit Note   Patient: Chelsea Jordan           Date of Birth: 1927/09/01           MRN: [id number] Visit Date: 11/23/2017 Requested by: Chelsea Borg, MD [place], [place] [address] PCP: Chelsea Borg, MD  Subjective: Chief Complaint  Patient presents with  . pelvic fx  . Elbow Injury    HPI: Chelsea Jordan is an 84 year old ambulatory female who injured herself falling on the concrete at friend's home 11/04/2017.  She fell in the parking lot.  Her old records are reviewed.  She had a pelvic fracture which showed up only on MRI scan.  That was inferior and superior pubic rami as well as sacral fracture on the left.  She also had left arm olecranon fracture.  She is been in a long-arm splint since that time.  She denies any other orthopedic complaints.  She has been weightbearing and has minimal pain on that left hand side              ROS: All systems reviewed are negative as they relate to the chief complaint within the history of present illness.  Patient denies  fevers or chills.   Assessment & Plan: Visit Diagnoses:  1. Left elbow pain   2. Pain in pelvis     Plan: Impression is nondisplaced left olecranon fracture.  Plan for that is sling immobilization with range of motion not to exceed 90 degrees of flexion.  Repeat radiographs in 3 weeks.  No lifting with that left arm.  In regards to the pelvis she can weight-bear as tolerated on that pelvis.  She is really asymptomatic today and the radiographs look good.  Follow-up in 3 weeks for recheck on elbow.  Follow-Up Instructions: Return in about 3 weeks (around 12/14/2017).   Orders:  Orders Placed This Encounter  Procedures  . XR Elbow Complete Left (3+View)  . XR Pelvis 1-2 Views   No orders of the defined types were placed in this encounter.     Procedures: No procedures performed   Clinical Data: No additional findings.  Objective: Vital Signs: There were no vitals taken for this visit.  Physical  Exam:   Constitutional: Patient appears well-developed HEENT:  Head: Normocephalic Eyes:EOM are normal Neck: Normal range of motion Cardiovascular: Normal rate Pulmonary/chest: Effort normal Neurologic: Patient is alert Skin: Skin is warm Psychiatric: Patient has normal mood and affect    Ortho Exam: Orthopedic exam demonstrates no pain with range of motion of the pelvis or hip on the left or right hand side.  Ankle dorsiflexion plantarflexion quad hamstring strength is good.  Hip abduction is slightly painful on the left but not painful on the right.  Left elbow is examined.  Full range of motion without coarse grinding or crepitus noted.  Her triceps is functional.  Swelling and ecchymosis and bruising is present in the elbow region but there is very smooth range of motion passively with the left elbow.  Specialty Comments:  No specialty comments available.  Imaging: Xr Elbow Complete Left (3+view)  Result Date: 11/23/2017 AP lateral left elbow reviewed.  Olecranon fracture which is nondisplaced is visualized.  Joint is located.  No other fractures seen in the left elbow region  Xr Pelvis 1-2 Views  Result Date: 11/23/2017 AP pelvis reviewed.  Pubic rami fractures on the left identified on prior MRI scan shows good healing with no displacement.  Sacral fracture also has no displacement on the left-hand side.  Remainder bony pelvis normal.    PMFS History: Patient Active Problem List   Diagnosis Date Noted  . Acute lower UTI 11/06/2017  . Olecranon fracture, left, closed, initial encounter 11/05/2017  . Memory dysfunction 02/09/2017  . Acute gastroenteritis 12/25/2016  . Preventative health care 07/15/2016  . Hyponatremia 12/25/2014  . Lightheadedness 12/25/2014  . Aortic stenosis 12/20/2014  . General weakness 12/18/2014  . Nausea 12/18/2014  . 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 Medicare annual  wellness visit, subsequent 07/12/2014  . Thoracic aortic aneurysm ([place]) 07/08/2014  . Recurrent UTI 03/12/2014  . Upper airway cough syndrome 01/03/2014  . Thrush 08/16/2013  . Eustachian tube dysfunction 08/16/2013  . Macular degeneration   . GERD (gastroesophageal reflux disease)   . Encounter for long-term (current) use of other medications 12/07/2010  . Mitral valve disorder 11/17/2009  . Hyperlipidemia 09/24/2009  . Essential hypertension 09/24/2009  . GENERALIZED OSTEOARTHROSIS UNSPECIFIED SITE 09/24/2009  . OSTEOPOROSIS 07/15/2009  . MENINGIOMA 10/20/2007  . 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 Asthma 10/20/2007  . NEPHROLITHIASIS 10/20/2007   Past Medical History:  Diagnosis Date  . Anxiety   . Benign fundic gland polyps of stomach   . Benign gastrointestinal stromal tumor (GIST)   . Bronchitis   . Cancer ([place])    recent skin cancer left leg  excised about 8 days ago-remains with dressing intact.   . Chronic cystitis   . COLONIC POLYPS, HX OF   . Complication of anesthesia   . Diverticulosis   . DYSLIPIDEMIA   . Gastritis   . GERD (gastroesophageal reflux disease)   . Hemorrhoids   . Hiatal hernia   . HYPERTENSION   . Macular degeneration    optho q46mo - [facility]  . Meningioma ([place])   . MITRAL VALVE PROLAPSE   . NEPHROLITHIASIS   . OSTEOARTHRITIS   . OSTEOPOROSIS   . PONV (postoperative nausea and vomiting)   . Thoracic aortic aneurysm (H[facility])    4.1 cm 2015    Family History  Problem Relation Age of Onset  . Heart disease Other   . Heart disease Mother   . Heart disease Father   . Emphysema Father   . Breast cancer Sister   . Lung cancer Sister   . Prostate cancer Son   . Prostate cancer Brother     Past Surgical History:  Procedure Laterality Date  . ABDOMINAL HYSTERECTOMY    . APPENDECTOMY    . BREAST SURGERY    . [place]   no PCI, performed in [place]  . Cataract surgery  2000  . CHOLECYSTECTOMY    . COLONOSCOPY    .  ESOPHAGOGASTRODUODENOSCOPY    . EUS N/A 05/16/2014   Procedure: UPPER ENDOSCOPIC ULTRASOUND (EUS) LINEAR;  Surgeon: Chelsea Banister, MD;  [facility];  Service: Endoscopy;  Laterality: N/A;  . INSERTION OF MESH  07/04/2012   Procedure: INSERTION OF MESH;  Surgeon: Chelsea Hollingshead, MD;  Location: [facility];  Service: General;  Laterality: N/A;  . KNEE SURGERY    . LEG SKIN LESION  BIOPSY / EXCISION Left    8 days ago pending pathology, remains with dressing.  . SURGERY OF LIP  in 2002  . TONSILLECTOMY    . VENTRAL HERNIA REPAIR  07/04/2012   Procedure: HERNIA REPAIR VENTRAL ADULT;  Surgeon: Chelsea Hollingshead, MD;  Location: [facility];  Service: General;  Laterality: N/A;   Social History   Occupational History  . Occupation: retired  Tobacco Use  . Smoking status: Never Smoker  . Smokeless tobacco: Never Used  . Tobacco comment: Married, lives with spouse, Enjoys golf. Retired  Substance and Sexual Activity  . Alcohol use: Yes    Alcohol/week: 0.0 oz    Comment: Wine -nightly  . Drug use: No  . Sexual activity: Not Currently

## 2017-11-25 ENCOUNTER — Encounter: Payer: Self-pay | Admitting: Internal Medicine

## 2017-11-25 DIAGNOSIS — S42402S Unspecified fracture of lower end of left humerus, sequela: Secondary | ICD-10-CM | POA: Insufficient documentation

## 2017-12-06 ENCOUNTER — Non-Acute Institutional Stay (SKILLED_NURSING_FACILITY): Payer: Medicare Other | Admitting: Family

## 2017-12-06 ENCOUNTER — Encounter: Payer: Self-pay | Admitting: Family

## 2017-12-06 DIAGNOSIS — M25522 Pain in left elbow: Secondary | ICD-10-CM | POA: Diagnosis not present

## 2017-12-06 DIAGNOSIS — R102 Pelvic and perineal pain: Secondary | ICD-10-CM | POA: Diagnosis not present

## 2017-12-06 DIAGNOSIS — K219 Gastro-esophageal reflux disease without esophagitis: Secondary | ICD-10-CM

## 2017-12-06 DIAGNOSIS — I10 Essential (primary) hypertension: Secondary | ICD-10-CM | POA: Diagnosis not present

## 2017-12-06 DIAGNOSIS — E782 Mixed hyperlipidemia: Secondary | ICD-10-CM | POA: Diagnosis not present

## 2017-12-06 NOTE — Progress Notes (Addendum)
Location:  [facility] Room Number: 40 Place of Service:  SNF [phone]) Provider: Nykiah Ma FNP-C   Biagio Borg, MD  Patient Care Team: Biagio Borg, MD as PCP - General (Internal Medicine) Minus Breeding, MD as Referring Physician (Cardiology) Tanda Rockers, MD as Referring Physician (Pulmonary Disease) Gatha Mayer, MD as Referring Physician (Gastroenterology) Gaynelle Arabian, MD as Referring Physician (Orthopedic Surgery) Melida Quitter, MD as Referring Physician Jackolyn Confer, MD (General Surgery)  Extended Emergency Contact Information Primary Emergency Contact: [facility] Address: [place],  [address] [name] of [place] Phone: [phone] Mobile Phone: [phone] Relation: Spouse Secondary Emergency Contact: Marcina Millard of [place] Phone: [phone] Mobile Phone: [phone] Relation: Son  Code Status: Full Code  Goals of care: Advanced Directive information Advanced Directives 12/06/2017  Does Patient Have a Medical Advance Directive? Yes  Type of Paramedic of [place];Living will  Does patient want to make changes to medical advance directive? -  Copy of [place] in Chart? Yes     Chief Complaint  Patient presents with  . Medical Management of Chronic Issues    HPI:  Pt is a 84 y.o. female seen today [facility] for medical management of chronic diseases.she has a medical history of HTN,Mitral valve prolapse,Hyperlipidemia,Asthma,GERD among other conditions.she is seen in her room today with Husband at bedside.she states left elbow pain and pelvic pain is under control with tylenol.of note she is status post hospital admission from 11/04/2017-11/06/2017 after a fall episode at a parking lot sustained non-displaced fracture of left olecranon and displaced fracture of left pubic rami.she was seen by Orthopedic specialist  DR.Scott at [facility] 11/23/2017 recommended WBAT to lower extremities,continue with LUE sling and no lifting with left arm.Patient to follow up in 3 weeks.she continues to work well with Therapy. No new fall episode reported.No recent weight changes.     Past Medical History:  Diagnosis Date  . Anxiety   . Benign fundic gland polyps of stomach   . Benign gastrointestinal stromal tumor (GIST)   . Bronchitis   . Cancer ([place])    recent skin cancer left leg  excised about 8 days ago-remains with dressing intact.   . Chronic cystitis   . COLONIC POLYPS, HX OF   . Complication of anesthesia   . Diverticulosis   . DYSLIPIDEMIA   . Gastritis   . GERD (gastroesophageal reflux disease)   . Hemorrhoids   . Hiatal hernia   . HYPERTENSION   . Macular degeneration    optho q50mo - [facility]  . Meningioma ([place])   . MITRAL VALVE PROLAPSE   . NEPHROLITHIASIS   . OSTEOARTHRITIS   . OSTEOPOROSIS   . PONV (postoperative nausea and vomiting)   . Thoracic aortic aneurysm ([facility])    4.1 cm 2015   Past Surgical History:  Procedure Laterality Date  . ABDOMINAL HYSTERECTOMY    . APPENDECTOMY    . BREAST SURGERY    . [place]   no PCI, performed in [place]  . Cataract surgery  2000  . CHOLECYSTECTOMY    . COLONOSCOPY    . ESOPHAGOGASTRODUODENOSCOPY    . EUS N/A 05/16/2014   Procedure: UPPER ENDOSCOPIC ULTRASOUND (EUS) LINEAR;  Surgeon: Milus Banister, MD;  Location: [facility];  Service: Endoscopy;  Laterality: N/A;  . INSERTION OF MESH  07/04/2012  Procedure: INSERTION OF MESH;  Surgeon: Odis Hollingshead, MD;  Location: [facility];  Service: General;  Laterality: N/A;  . KNEE SURGERY    . LEG SKIN LESION  BIOPSY / EXCISION Left    8 days ago pending pathology, remains with dressing.  . SURGERY OF LIP  in 2002  . TONSILLECTOMY    . VENTRAL HERNIA REPAIR  07/04/2012   Procedure: HERNIA REPAIR VENTRAL ADULT;  Surgeon: Odis Hollingshead, MD;  Location: [facility];   Service: General;  Laterality: N/A;    Allergies  Allergen Reactions  . Pentothal [Thiopental] Nausea And Vomiting  . Codeine   . Levofloxacin   . Oxycodone-Acetaminophen   . Sulfonamide Derivatives     REACTION: GI upset/nausea/vomiting  . Tape Rash    Per patient adhesive tape    Allergies as of 12/06/2017      Reactions   Pentothal [thiopental] Nausea And Vomiting   Codeine    Levofloxacin    Oxycodone-acetaminophen    Sulfonamide Derivatives    REACTION: GI upset/nausea/vomiting   Tape Rash   Per patient adhesive tape      Medication List        Accurate as of 12/06/17  6:35 PM. Always use your most recent med list.          acetaminophen 500 MG tablet Commonly known as:  TYLENOL Take 500-1,000 mg by mouth every 6 (six) hours as needed.   aspirin 81 MG tablet Take 81 mg by mouth at bedtime.   CALCIUM 500+D 500-200 MG-UNIT tablet Generic drug:  calcium-vitamin D Take 1 tablet by mouth daily.   lactose free nutrition Liqd Take 237 mLs by mouth daily.   losartan 50 MG tablet Commonly known as:  COZAAR TAKE 1 TABLET BY MOUTH EVERY MORNING   omeprazole 20 MG capsule Commonly known as:  PRILOSEC Take 20 mg by mouth 2 (two) times daily before a meal.   PRESERVISION AREDS 2 PO Take 1 tablet by mouth daily.   simvastatin 5 MG tablet Commonly known as:  ZOCOR TAKE 1 TABLET BY MOUTH AT BEDTIME.   vitamin C 500 MG tablet Commonly known as:  ASCORBIC ACID Take 500 mg by mouth daily.   Vitamin D3 2000 units Tabs Take 2,000 Units by mouth daily.   vitamin E 400 UNIT capsule Take 400 Units by mouth every morning.       Review of Systems  Constitutional: Negative for appetite change, chills, fatigue and fever.  HENT: Negative for congestion, rhinorrhea, sinus pressure, sinus pain, sneezing and sore throat.   Eyes: Negative for discharge, redness, itching and visual disturbance.  Respiratory: Negative for cough, chest tightness, shortness of breath and  wheezing.   Cardiovascular: Negative for chest pain, palpitations and leg swelling.  Gastrointestinal: Negative for abdominal distention, abdominal pain, constipation, diarrhea, nausea and vomiting.  Endocrine: Negative for cold intolerance, heat intolerance, polydipsia, polyphagia and polyuria.  Genitourinary: Negative for dysuria, flank pain, frequency and urgency.  Musculoskeletal: Positive for arthralgias.       WBAT LE and LUE sling   Skin: Negative for color change, pallor and rash.  Neurological: Negative for dizziness, light-headedness and headaches.  Hematological: Does not bruise/bleed easily.  Psychiatric/Behavioral: Negative for agitation, confusion and sleep disturbance. The patient is not nervous/anxious.     Immunization History  Administered Date(s) Administered  . Influenza Split 06/07/2011, 06/13/2012, 06/15/2013  . Influenza Whole 06/06/2009, 05/18/2010  . Influenza, High Dose Seasonal PF 06/15/2013, 06/23/2017  . Influenza-Unspecified  06/07/2015, 05/28/2016  . Pneumococcal Conjugate-13 07/12/2014  . Pneumococcal Polysaccharide-23 09/07/2007  . Td 09/07/2007   Pertinent  Health Maintenance Due  Topic Date Due  . INFLUENZA VACCINE  04/06/2018  . DEXA SCAN  Completed  . PNA vac Low Risk Adult  Completed   Fall Risk  06/23/2017 02/25/2017 07/15/2016 07/14/2015 07/14/2015  Falls in the past year? Yes Yes No Yes Yes  Number falls in past yr: 1 2 or more - 1 1  Injury with Fall? Yes Yes - Yes No  Comment left wrist fx - - - -  Risk Factor Category  - High Fall Risk - High Fall Risk -  Risk for fall due to : - - - - Other (Comment)  Follow up - - - Education provided;Falls prevention discussed -    Vitals:   12/06/17 1053  BP: (!) 102/59  Pulse: (!) 54  Resp: 20  Temp: 97.6 F (36.4 C)  SpO2: 97%  Weight: 122 lb 6.4 oz (55.5 kg)  Height: 5\' 4"  (1.626 m)   Body mass index is 21.01 kg/m. Physical Exam  Constitutional:  Thin built elderly in no acute  distress   HENT:  Head: Normocephalic.  Right Ear: External ear normal.  Left Ear: External ear normal.  Mouth/Throat: Oropharynx is clear and moist. No oropharyngeal exudate.  Eyes: Pupils are equal, round, and reactive to light. Conjunctivae and EOM are normal. Right eye exhibits no discharge. Left eye exhibits no discharge. No scleral icterus.  Neck: Normal range of motion. No JVD present. No thyromegaly present.  Cardiovascular: Normal rate, regular rhythm, normal heart sounds and intact distal pulses. Exam reveals no gallop and no friction rub.  No murmur heard. Pulmonary/Chest: Effort normal and breath sounds normal. No respiratory distress. She has no wheezes. She has no rales. She exhibits no tenderness.  Abdominal: Soft. Bowel sounds are normal. She exhibits no distension. There is no tenderness. There is no rebound and no guarding.  Musculoskeletal: She exhibits no edema.  Left upper extremities sling in place.Fingers pink in color warm to touch and moves without any difficulties.   Lymphadenopathy:    She has no cervical adenopathy.  Neurological:  Alert and oriented to person and place but not time.   Skin: Skin is warm and dry. No rash noted. No erythema. No pallor.  Psychiatric: She has a normal mood and affect.    Labs reviewed: Recent Labs    02/09/17 1530 11/04/17 2138  NA 138 140  K 4.0 4.0  CL 102 101  CO2 30 27  GLUCOSE 98 89  BUN 16 16  CREATININE 0.55 0.50  CALCIUM 9.7 9.5   Recent Labs    02/09/17 1530  AST 18  ALT 14  ALKPHOS 82  BILITOT 0.5  PROT 6.5  ALBUMIN 4.2   Recent Labs    02/09/17 1530 11/04/17 2138 11/06/17 0924  WBC 7.4 16.9* 9.3  NEUTROABS 4.4 14.7* 7.0  HGB 13.9 13.9 12.3  HCT 41.9 41.7 36.7  MCV 91.5 93.7 92.4  PLT 194.0 199 152   Lab Results  Component Value Date   TSH 2.41 02/09/2017   No results found for: HGBA1C Lab Results  Component Value Date   CHOL 185 02/09/2017   HDL 85.60 02/09/2017   LDLCALC 79  02/09/2017   LDLDIRECT 97.7 06/22/2013   TRIG 104.0 02/09/2017   CHOLHDL 2 02/09/2017    Significant Diagnostic Results in last 30 days:  Xr Elbow Complete Left (3+view)  Result Date: 11/23/2017 AP lateral left elbow reviewed.  Olecranon fracture which is nondisplaced is visualized.  Joint is located.  No other fractures seen in the left elbow region  Xr Pelvis 1-2 Views  Result Date: 11/23/2017 AP pelvis reviewed.  Pubic rami fractures on the left identified on prior MRI scan shows good healing with no displacement.  Sacral fracture also has no displacement on the left-hand side.  Remainder bony pelvis normal.   Assessment/Plan 1. Left elbow pain Afebrile.status post fall episode at a parking lot sustained non-displaced fracture of left olecranon seen by Orthopedic specialist DR.Scott at [facility] 11/23/2017 recommended WBAT to lower extremities,continue with LUE sling and no lifting with left arm.Patient to follow up in 3 weeks.continue with PT/OT as indicated. Currently on Tylenol  As needed has been requesting  tylenol at least twice daily.will schedule extra strength tylenol 500 mg tablet twice daily for pain.continue to monitor.follow up with Orthopedic as directed.    2. Pelvic pain Status post fall episode with displaced fracture of left pubic rami.change Extra strength tylenol as above.continue to follow up with Orthopedic as directed.   3. Essential hypertension Stable.continue on Losartan 50 mg tablet daily.On ASA and Simvastatin 5 mg tablet daily.continue to monitor BMP.   4. Mixed hyperlipidemia Continue on simvastatin 5 mg tablet daily.check fasting lipid panel periodically.   5. Gastroesophageal reflux disease without esophagitis Stable.continue on omeprazole 20 mg tablet twice daily.monitor Mg level with next lab work.   Family/ staff Communication: Reviewed plan of care with patient,Patient's husband and facility Nurse   Labs/tests ordered: None     Aryella Besecker C Lisset Ketchem, NP

## 2017-12-14 ENCOUNTER — Encounter (INDEPENDENT_AMBULATORY_CARE_PROVIDER_SITE_OTHER): Payer: Self-pay | Admitting: Orthopedic Surgery

## 2017-12-14 ENCOUNTER — Ambulatory Visit (INDEPENDENT_AMBULATORY_CARE_PROVIDER_SITE_OTHER): Payer: Medicare Other

## 2017-12-14 ENCOUNTER — Ambulatory Visit (INDEPENDENT_AMBULATORY_CARE_PROVIDER_SITE_OTHER): Payer: Medicare Other | Admitting: Orthopedic Surgery

## 2017-12-14 DIAGNOSIS — M25522 Pain in left elbow: Secondary | ICD-10-CM

## 2017-12-15 ENCOUNTER — Encounter (INDEPENDENT_AMBULATORY_CARE_PROVIDER_SITE_OTHER): Payer: Self-pay | Admitting: Orthopedic Surgery

## 2017-12-15 NOTE — Progress Notes (Signed)
Post-Op Visit Note   Patient: Chelsea Jordan           Date of Birth: 1927/04/25           MRN: [id number] Visit Date: 12/14/2017 PCP: Biagio Borg, MD   Assessment & Plan:  Chief Complaint:  Chief Complaint  Patient presents with  . Left Elbow - Follow-up  . Pelvis - Follow-up   Visit Diagnoses:  1. Left elbow pain     Plan: She changes a patient with left elbow olecranon fracture.  She has been weightbearing.  She also has pelvic fractures which can only be seen on MRI scan.  She is walking with physical therapy.  On exam no groin pain with internal/external rotation of the leg.  She has excellent range of motion of that left elbow with functional triceps strength.  Radiographs show no change in fracture alignment.  I want her to come out of the sling but do not do any lifting or pushing off with that left arm.  Come back in 3 weeks for final x-rays and release.  Find to be weightbearing as tolerated because that pelvic fractures are healed.  Follow-Up Instructions: Return in about 3 weeks (around 01/04/2018).   Orders:  Orders Placed This Encounter  Procedures  . XR Elbow 2 Views Left   No orders of the defined types were placed in this encounter.   Imaging: No results found.  PMFS History: Patient Active Problem List   Diagnosis Date Noted  . Left elbow fracture, sequela 11/25/2017  . Acute lower UTI 11/06/2017  . Olecranon fracture, left, closed, initial encounter 11/05/2017  . Memory dysfunction 02/09/2017  . Acute gastroenteritis 12/25/2016  . Preventative health care 07/15/2016  . Hyponatremia 12/25/2014  . Lightheadedness 12/25/2014  . Aortic stenosis 12/20/2014  . General weakness 12/18/2014  . Nausea 12/18/2014  . 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 Medicare annual wellness visit, subsequent 07/12/2014  . Thoracic aortic aneurysm ([place]) 07/08/2014  . Recurrent UTI 03/12/2014  . Upper  airway cough syndrome 01/03/2014  . Thrush 08/16/2013  . Eustachian tube dysfunction 08/16/2013  . Macular degeneration   . GERD (gastroesophageal reflux disease)   . Encounter for long-term (current) use of other medications 12/07/2010  . Mitral valve disorder 11/17/2009  . Hyperlipidemia 09/24/2009  . Essential hypertension 09/24/2009  . GENERALIZED OSTEOARTHROSIS UNSPECIFIED SITE 09/24/2009  . OSTEOPOROSIS 07/15/2009  . MENINGIOMA 10/20/2007  . 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 Asthma 10/20/2007  . NEPHROLITHIASIS 10/20/2007   Past Medical History:  Diagnosis Date  . Anxiety   . Benign fundic gland polyps of stomach   . Benign gastrointestinal stromal tumor (GIST)   . Bronchitis   . Cancer ([place])    recent skin cancer left leg  excised about 8 days ago-remains with dressing intact.   . Chronic cystitis   . COLONIC POLYPS, HX OF   . Complication of anesthesia   . Diverticulosis   . DYSLIPIDEMIA   . Gastritis   . GERD (gastroesophageal reflux disease)   . Hemorrhoids   . Hiatal hernia   . HYPERTENSION   . Macular degeneration    optho q72mo - [facility]  . Meningioma ([place])   . MITRAL VALVE PROLAPSE   . NEPHROLITHIASIS   . OSTEOARTHRITIS   . OSTEOPOROSIS   . PONV (postoperative nausea and vomiting)   . Thoracic aortic aneurysm ([facility])    4.1 cm 2015  Family History  Problem Relation Age of Onset  . Heart disease Other   . Heart disease Mother   . Heart disease Father   . Emphysema Father   . Breast cancer Sister   . Lung cancer Sister   . Prostate cancer Son   . Prostate cancer Brother     Past Surgical History:  Procedure Laterality Date  . ABDOMINAL HYSTERECTOMY    . APPENDECTOMY    . BREAST SURGERY    . [place]   no PCI, performed in [place]  . Cataract surgery  2000  . CHOLECYSTECTOMY    . COLONOSCOPY    . ESOPHAGOGASTRODUODENOSCOPY    . EUS N/A 05/16/2014   Procedure: UPPER ENDOSCOPIC ULTRASOUND (EUS) LINEAR;  Surgeon: Milus Banister, MD;  [facility];  Service: Endoscopy;  Laterality: N/A;  . INSERTION OF MESH  07/04/2012   Procedure: INSERTION OF MESH;  Surgeon: Odis Hollingshead, MD;  Location: [facility];  Service: General;  Laterality: N/A;  . KNEE SURGERY    . LEG SKIN LESION  BIOPSY / EXCISION Left    8 days ago pending pathology, remains with dressing.  . SURGERY OF LIP  in 2002  . TONSILLECTOMY    . VENTRAL HERNIA REPAIR  07/04/2012   Procedure: HERNIA REPAIR VENTRAL ADULT;  Surgeon: Odis Hollingshead, MD;  Location: [facility];  Service: General;  Laterality: N/A;   Social History   Occupational History  . Occupation: retired  Tobacco Use  . Smoking status: Never Smoker  . Smokeless tobacco: Never Used  . Tobacco comment: Married, lives with spouse, Enjoys golf. Retired  Substance and Sexual Activity  . Alcohol use: Yes    Alcohol/week: 0.0 oz    Comment: Wine -nightly  . Drug use: No  . Sexual activity: Not Currently

## 2017-12-21 ENCOUNTER — Encounter: Payer: Self-pay | Admitting: Family

## 2017-12-21 ENCOUNTER — Non-Acute Institutional Stay (SKILLED_NURSING_FACILITY): Payer: Medicare Other | Admitting: Family

## 2017-12-21 DIAGNOSIS — S52022D Displaced fracture of olecranon process without intraarticular extension of left ulna, subsequent encounter for closed fracture with routine healing: Secondary | ICD-10-CM

## 2017-12-21 DIAGNOSIS — R4189 Other symptoms and signs involving cognitive functions and awareness: Secondary | ICD-10-CM

## 2017-12-21 DIAGNOSIS — S322XXS Fracture of coccyx, sequela: Secondary | ICD-10-CM

## 2017-12-21 DIAGNOSIS — S3210XS Unspecified fracture of sacrum, sequela: Secondary | ICD-10-CM

## 2017-12-21 DIAGNOSIS — K219 Gastro-esophageal reflux disease without esophagitis: Secondary | ICD-10-CM

## 2017-12-21 DIAGNOSIS — I10 Essential (primary) hypertension: Secondary | ICD-10-CM | POA: Diagnosis not present

## 2017-12-21 DIAGNOSIS — E782 Mixed hyperlipidemia: Secondary | ICD-10-CM

## 2017-12-21 NOTE — Progress Notes (Signed)
Location:  [facility] Room Number: 40 Place of Service:  SNF [phone])  Provider: Marlowe Sax FNP-C   PCP: Biagio Borg, MD Patient Care Team: Biagio Borg, MD as PCP - General (Internal Medicine) Minus Breeding, MD as Referring Physician (Cardiology) Tanda Rockers, MD as Referring Physician (Pulmonary Disease) Gatha Mayer, MD as Referring Physician (Gastroenterology) Gaynelle Arabian, MD as Referring Physician (Orthopedic Surgery) Melida Quitter, MD as Referring Physician Jackolyn Confer, MD (General Surgery)  Extended Emergency Contact Information Primary Emergency Contact: [facility] Address: [place], [place] [address] [name] of [place] Phone: [phone] Mobile Phone: [phone] Relation: Spouse Secondary Emergency Contact: Marcina Millard of [place] Phone: [phone] Mobile Phone: [phone] Relation: Son  Code Status: Full Code  Goals of care:  Advanced Directive information Advanced Directives 12/21/2017  Does Patient Have a Medical Advance Directive? Yes  Type of Advance Directive [place]  Does patient want to make changes to medical advance directive? -  Copy of [place] in Chart? Yes     Allergies  Allergen Reactions  . Pentothal [Thiopental] Nausea And Vomiting  . Codeine   . Levofloxacin   . Oxycodone-Acetaminophen   . Sulfonamide Derivatives     REACTION: GI upset/nausea/vomiting  . Tape Rash    Per patient adhesive tape    Chief Complaint  Patient presents with  . Discharge Note    discharge back to [place]    HPI:  84 y.o. female seen today at [facility] for discharge home.She was here for short term rehabilitation for post hospital admission  from 11/04/2017-11/06/2017 after a fall episode at a parking lot sustained non-displaced fracture of left olecranon and displaced fracture of left pubic rami.she was seen by  Orthopedic during hospital stay.A splint was applied to left arm and recommended non weight bearing to left upper extremity until follow up with Orthopedic.She was also treated with Keflex for urinary tract infection.She was discharged to [facility] at [facility] for short term rehabilitation.she was seen by Orthopedic specialist DR.Scott at [facility] 12/14/2017 recommended ROM but no lifting.Patient to follow up in 3 weeks and to continue with PT twice per week for 4 weeks.Of note he has a medical history of HTN,Hperlipidemia,Asthma,Aortic stenosis,GERD,Meningioma,OA among other conditions.she is seen in her room today with Husband present at bedside.she denies any acute issues during visit.she states left arm pain under control.Facility staff report no new concerns.    She has had unremarkable stay here in rehab.she has worked well with PT/OT/ST now stable for discharge back to independent Living.She will be discharged with Home health PT/OT/ST to continue with ROM and Exercise.. She does not require any DME has own cane.Discharge process arranged by facility social worker prior to discharge.she will be discharged with her  Prescription medication then patient to follow up with PCP in 1-2 weeks.Patient's Husband states has an appointment made already with Dr.James Jenny Reichmann at [facility] care on 12/26/2017.   Past Medical History:  Diagnosis Date  . Anxiety   . Benign fundic gland polyps of stomach   . Benign gastrointestinal stromal tumor (GIST)   . Bronchitis   . Cancer ([place])    recent skin cancer left leg  excised about 8 days ago-remains with dressing intact.   . Chronic cystitis   . COLONIC POLYPS, HX OF   . Complication of  anesthesia   . Diverticulosis   . DYSLIPIDEMIA   . Gastritis   . GERD (gastroesophageal reflux disease)   . Hemorrhoids   . Hiatal hernia   . HYPERTENSION   . Macular degeneration    optho q62mo - Hecker  .  Meningioma ([place])   . MITRAL VALVE PROLAPSE   . NEPHROLITHIASIS   . OSTEOARTHRITIS   . OSTEOPOROSIS   . PONV (postoperative nausea and vomiting)   . Thoracic aortic aneurysm (HCC)    4.1 cm 2015    Past Surgical History:  Procedure Laterality Date  . ABDOMINAL HYSTERECTOMY    . APPENDECTOMY    . BREAST SURGERY    . [place]   no PCI, performed in [place]  . Cataract surgery  2000  . CHOLECYSTECTOMY    . COLONOSCOPY    . ESOPHAGOGASTRODUODENOSCOPY    . EUS N/A 05/16/2014   Procedure: UPPER ENDOSCOPIC ULTRASOUND (EUS) LINEAR;  Surgeon: Milus Banister, MD;  Location: [facility];  Service: Endoscopy;  Laterality: N/A;  . INSERTION OF MESH  07/04/2012   Procedure: INSERTION OF MESH;  Surgeon: Odis Hollingshead, MD;  Location: [place];  Service: General;  Laterality: N/A;  . KNEE SURGERY    . LEG SKIN LESION  BIOPSY / EXCISION Left    8 days ago pending pathology, remains with dressing.  . SURGERY OF LIP  in 2002  . TONSILLECTOMY    . VENTRAL HERNIA REPAIR  07/04/2012   Procedure: HERNIA REPAIR VENTRAL ADULT;  Surgeon: Odis Hollingshead, MD;  Location: [place];  Service: General;  Laterality: N/A;      reports that she has never smoked. She has never used smokeless tobacco. She reports that she drinks alcohol. She reports that she does not use drugs. Social History   Socioeconomic History  . Marital status: Married    Spouse name: Not on file  . Number of children: 2  . Years of education: Not on file  . Highest education level: Not on file  Occupational History  . Occupation: retired  Scientific laboratory technician  . Financial resource strain: Not on file  . Food insecurity:    Worry: Not on file    Inability: Not on file  . Transportation needs:    Medical: Not on file    Non-medical: Not on file  Tobacco Use  . Smoking status: Never Smoker  . Smokeless tobacco: Never Used  . Tobacco comment: Married, lives with spouse, Enjoys golf. Retired  Substance and Sexual  Activity  . Alcohol use: Yes    Alcohol/week: 0.0 oz    Comment: Wine -nightly  . Drug use: No  . Sexual activity: Not Currently  Lifestyle  . Physical activity:    Days per week: Not on file    Minutes per session: Not on file  . Stress: Not on file  Relationships  . Social connections:    Talks on phone: Not on file    Gets together: Not on file    Attends religious service: Not on file    Active member of club or organization: Not on file    Attends meetings of clubs or organizations: Not on file    Relationship status: Not on file  . Intimate partner violence:    Fear of current or ex partner: Not on file    Emotionally abused: Not on file    Physically abused: Not on file    Forced sexual activity: Not on file  Other Topics Concern  . Not on file  Social History Narrative   Married, lives with spouse - enjoys golf   Lives at [place] Reactions  . Pentothal [Thiopental] Nausea And Vomiting  . Codeine   . Levofloxacin   . Oxycodone-Acetaminophen   . Sulfonamide Derivatives     REACTION: GI upset/nausea/vomiting  . Tape Rash    Per patient adhesive tape    Pertinent  Health Maintenance Due  Topic Date Due  . INFLUENZA VACCINE  04/06/2018  . DEXA SCAN  Completed  . PNA vac Low Risk Adult  Completed    Medications: Outpatient Encounter Medications as of 12/21/2017  Medication Sig  . acetaminophen (TYLENOL) 500 MG tablet Take 500-1,000 mg by mouth every 6 (six) hours as needed.  Marland Kitchen acetaminophen (TYLENOL) 500 MG tablet Take 500 mg by mouth 2 (two) times daily.  . Ascorbic Acid (VITAMIN C) 500 MG tablet Take 500 mg by mouth daily.    Marland Kitchen aspirin 81 MG tablet Take 81 mg by mouth at bedtime.   . calcium-vitamin D (CALCIUM 500+D) 500-200 MG-UNIT per tablet Take 1 tablet by mouth daily.    . Cholecalciferol (VITAMIN D3) 2000 UNITS TABS Take 2,000 Units by mouth daily.    Marland Kitchen lactose free nutrition (BOOST) LIQD Take 237 mLs by mouth daily.  Marland Kitchen  losartan (COZAAR) 50 MG tablet TAKE 1 TABLET BY MOUTH EVERY MORNING  . Multiple Vitamins-Minerals (PRESERVISION AREDS 2 PO) Take 1 tablet by mouth daily.  Marland Kitchen omeprazole (PRILOSEC) 20 MG capsule Take 20 mg by mouth 2 (two) times daily before a meal.  . simvastatin (ZOCOR) 5 MG tablet TAKE 1 TABLET BY MOUTH AT BEDTIME.  . vitamin E 400 UNIT capsule Take 400 Units by mouth every morning.   No facility-administered encounter medications on file as of 12/21/2017.      Review of Systems  Constitutional: Negative for activity change, appetite change, chills, fatigue and fever.  HENT: Positive for hearing loss. Negative for congestion, rhinorrhea, sinus pressure, sinus pain, sneezing and sore throat.   Eyes: Negative for discharge, redness, itching and visual disturbance.  Respiratory: Negative for cough, chest tightness, shortness of breath and wheezing.   Cardiovascular: Negative for chest pain, palpitations and leg swelling.  Gastrointestinal: Negative for abdominal distention, abdominal pain, constipation, diarrhea, nausea and vomiting.       LBM 12/20/2017  Endocrine: Negative for cold intolerance, heat intolerance, polydipsia, polyphagia and polyuria.  Genitourinary: Negative for dysuria, flank pain, frequency and urgency.  Musculoskeletal: Positive for gait problem.       Left arm pain under control with tylenol.   Skin: Negative for color change, pallor and rash.  Neurological: Negative for dizziness, light-headedness and headaches.  Psychiatric/Behavioral: Negative for agitation. The patient is not nervous/anxious.        Unable to sleep at times due to alarms in the facility.    Vitals:   12/21/17 1106  BP: 131/73  Pulse: 67  Resp: 20  Temp: (!) 97.1 F (36.2 C)  SpO2: 95%  Weight: 120 lb (54.4 kg)  Height: 5\' 4"  (1.626 m)   Body mass index is 20.6 kg/m.  Physical Exam  Constitutional:  Thin built frail elderly in no acute distress   HENT:  Head: Normocephalic.  Left  Ear: External ear normal.  Mouth/Throat: Oropharynx is clear and moist. No oropharyngeal exudate.  Right ear canal cerumen impaction TM not visualized.   Eyes: Pupils are equal, round,  and reactive to light. Conjunctivae and EOM are normal. Right eye exhibits no discharge. Left eye exhibits no discharge. No scleral icterus.  Neck: Normal range of motion. No JVD present. No thyromegaly present.  Cardiovascular: Normal rate, regular rhythm, normal heart sounds and intact distal pulses. Exam reveals no gallop and no friction rub.  No murmur heard. Respiratory: Effort normal and breath sounds normal. No respiratory distress. She has no wheezes. She has no rales.  GI: Soft. Bowel sounds are normal. She exhibits no distension. There is no tenderness. There is no rebound and no guarding.  Genitourinary:  Genitourinary Comments: Continent  Musculoskeletal: She exhibits no edema or tenderness.  Moves x 4 extremities except LUE ACE wrap in place.  Lymphadenopathy:    She has no cervical adenopathy.  Neurological: Gait abnormal.  Alert and Oriented to person and place   Skin: Skin is warm and dry. No rash noted. No erythema. No pallor.  Psychiatric: She has a normal mood and affect. Her speech is normal and behavior is normal.    Labs reviewed: Basic Metabolic Panel: Recent Labs    02/09/17 1530 11/04/17 2138  NA 138 140  K 4.0 4.0  CL 102 101  CO2 30 27  GLUCOSE 98 89  BUN 16 16  CREATININE 0.55 0.50  CALCIUM 9.7 9.5   Liver Function Tests: Recent Labs    02/09/17 1530  AST 18  ALT 14  ALKPHOS 82  BILITOT 0.5  PROT 6.5  ALBUMIN 4.2   No results for input(s): LIPASE, AMYLASE in the last 8760 hours. No results for input(s): AMMONIA in the last 8760 hours.  CBC: Recent Labs    02/09/17 1530 11/04/17 2138 11/06/17 0924  WBC 7.4 16.9* 9.3  NEUTROABS 4.4 14.7* 7.0  HGB 13.9 13.9 12.3  HCT 41.9 41.7 36.7  MCV 91.5 93.7 92.4  PLT 194.0 199 152    Procedures and Imaging  Studies within the past 30 days:  Xr Elbow 2 Views Left  Result Date: 12/15/2017 AP lateral left elbow reviewed.  Again noted is elbow olecranon fracture which is not changed in displacement or alignment compared to radiographs 3 weeks ago.  Xr Elbow Complete Left (3+view)  Result Date: 11/23/2017 AP lateral left elbow reviewed.  Olecranon fracture which is nondisplaced is visualized.  Joint is located.  No other fractures seen in the left elbow region  Xr Pelvis 1-2 Views  Result Date: 11/23/2017 AP pelvis reviewed.  Pubic rami fractures on the left identified on prior MRI scan shows good healing with no displacement.  Sacral fracture also has no displacement on the left-hand side.  Remainder bony pelvis normal.   Assessment/Plan:    1. Closed fracture of left olecranon process with routine healing, subsequent encounter Status post hospital admission from 11/04/2017-11/06/2017 after a fall episode at a parking lot sustained non-displaced fracture of left olecranon and displaced fracture of left pubic rami.Pain under control. ACE wrap in place.Has appointment at [facility] 12/14/2017 recommended ROM but no lifting.Patient to follow up in 3 weeks and to continue with PT twice per week for 4 weeks.  2. Sacrum and coccyx fracture, sequela Pain under control with Tylenol.continue to follow up with Orthopedic.discharged with PT/OT.  3. Essential hypertension B/p stable.continue on losartan 50 mg tablet daily.On ASA 81 mg tablet for chest prophylaxis.On simvastatin 5 mg tablet at bedtime.    4. Mixed hyperlipidemia Continue on simvastatin 5 mg tablet at bedtime.Had lipid panel schedule to be drawn on 12/22/2017.will  fax result to PCP for follow up.   5. Gastroesophageal reflux disease without esophagitis Stable.continue on omeprazole 20 mg capsule daily.monitor mg level.   6. Cognitive impairment Continue to redirect.Discharged with speech Therapy.   Patient is being  discharged with the following home health services:   -PT/OT for ROM, exercise, gait stability and muscle strengthening.ST for cognition.  Patient is being discharged with the following durable medical equipment:   -Has own cane.   Patient has been advised to f/u with their PCP in 1-2 weeks to for a transitions of care visit.Social services at their facility was responsible for arranging this appointment.Pt was discharge with her medication of noncontrolled medications to reach the scheduled appointment.For controlled substances, a limited supply was provided as appropriate for the individual patient. If the pt normally receives these medications from a pain clinic or has a contract with another physician, these medications should be received from that clinic or physician only).    Future labs/tests needed:  CBC, BMP in 1-2 weeks PCP

## 2017-12-26 ENCOUNTER — Ambulatory Visit: Payer: Medicare Other | Admitting: Internal Medicine

## 2017-12-26 ENCOUNTER — Encounter: Payer: Self-pay | Admitting: Internal Medicine

## 2017-12-26 ENCOUNTER — Other Ambulatory Visit (INDEPENDENT_AMBULATORY_CARE_PROVIDER_SITE_OTHER): Payer: Medicare Other

## 2017-12-26 VITALS — BP 112/76 | HR 71 | Temp 98.0°F | Ht 64.0 in | Wt 124.0 lb

## 2017-12-26 DIAGNOSIS — E782 Mixed hyperlipidemia: Secondary | ICD-10-CM | POA: Diagnosis not present

## 2017-12-26 DIAGNOSIS — I10 Essential (primary) hypertension: Secondary | ICD-10-CM

## 2017-12-26 DIAGNOSIS — J4520 Mild intermittent asthma, uncomplicated: Secondary | ICD-10-CM | POA: Diagnosis not present

## 2017-12-26 LAB — BASIC METABOLIC PANEL
BUN: 13 mg/dL (ref 6–23)
CHLORIDE: 103 meq/L (ref 96–112)
CO2: 27 meq/L (ref 19–32)
CREATININE: 0.51 mg/dL (ref 0.40–1.20)
Calcium: 9.5 mg/dL (ref 8.4–10.5)
GFR: 120.72 mL/min (ref >60.00)
GLUCOSE: 90 mg/dL (ref 70–99)
Potassium: 4.1 mEq/L (ref 3.5–5.1)
Sodium: 142 mEq/L (ref 135–145)

## 2017-12-26 LAB — CBC WITH DIFFERENTIAL/PLATELET
BASOS ABS: 0 10*3/uL (ref 0.0–0.1)
Basophils Relative: 0.6 % (ref 0.0–3.0)
EOS ABS: 0.2 10*3/uL (ref 0.0–0.7)
Eosinophils Relative: 2.1 % (ref 0.0–5.0)
HCT: 39.7 % (ref 36.0–46.0)
Hemoglobin: 13.3 g/dL (ref 12.0–15.0)
LYMPHS ABS: 1.3 10*3/uL (ref 0.7–4.0)
Lymphocytes Relative: 17.3 % (ref 12.0–46.0)
MCHC: 33.6 g/dL (ref 30.0–36.0)
MCV: 92.8 fl (ref 78.0–100.0)
Monocytes Absolute: 0.8 10*3/uL (ref 0.1–1.0)
Monocytes Relative: 11.1 % (ref 3.0–12.0)
NEUTROS ABS: 5.1 10*3/uL (ref 1.4–7.7)
NEUTROS PCT: 68.9 % (ref 43.0–77.0)
PLATELETS: 190 10*3/uL (ref 150.0–400.0)
RBC: 4.28 Mil/uL (ref 3.87–5.11)
RDW: 13.6 % (ref 11.5–15.5)
WBC: 7.4 10*3/uL (ref 4.0–10.5)

## 2017-12-26 LAB — HEPATIC FUNCTION PANEL
ALK PHOS: 126 U/L — AB (ref 39–117)
ALT: 14 U/L (ref 0–35)
AST: 17 U/L (ref 0–37)
Albumin: 4 g/dL (ref 3.5–5.2)
BILIRUBIN DIRECT: 0.1 mg/dL (ref 0.0–0.3)
BILIRUBIN TOTAL: 0.6 mg/dL (ref 0.2–1.2)
Total Protein: 6.2 g/dL (ref 6.0–8.3)

## 2017-12-26 LAB — LIPID PANEL
CHOL/HDL RATIO: 2
Cholesterol: 167 mg/dL (ref 0–200)
HDL: 81.2 mg/dL (ref >39.00)
LDL CALC: 71 mg/dL (ref 0–99)
NONHDL: 85.55
TRIGLYCERIDES: 73 mg/dL (ref 0.0–149.0)
VLDL: 14.6 mg/dL (ref 0.0–40.0)

## 2017-12-26 LAB — TSH: TSH: 1.58 u[IU]/mL (ref 0.35–4.50)

## 2017-12-26 NOTE — Patient Instructions (Signed)

## 2017-12-26 NOTE — Assessment & Plan Note (Signed)
stable overall by history and exam, recent data reviewed with pt, and pt to continue medical treatment as before,  to f/u any worsening symptoms or concerns BP Readings from Last 3 Encounters:  12/26/17 112/76  12/21/17 131/73  12/06/17 (!) 102/59

## 2017-12-26 NOTE — Assessment & Plan Note (Signed)
Lab Results  Component Value Date   LDLCALC 79 02/09/2017  stable overall by history and exam, recent data reviewed with pt, and pt to continue medical treatment as before,  to f/u any worsening symptoms or concerns, for f/ulab today per pt reuqest

## 2017-12-26 NOTE — Assessment & Plan Note (Signed)
stable overall by history and exam, , and pt to continue medical treatment as before,  to f/u any worsening symptoms or concerns  

## 2017-12-26 NOTE — Progress Notes (Signed)
Subjective:    Patient ID: Chelsea Jordan, female    DOB: May 21, 1927, 84 y.o.   MRN: [id number]  HPI Here to f/u; overall doing ok,  Pt denies chest pain, increasing sob or doe, wheezing, orthopnea, PND, increased LE swelling, palpitations, dizziness or syncope.  Pt denies new neurological symptoms such as new headache, or facial or extremity weakness or numbness.  Pt denies polydipsia, polyuria, or low sugar episode.  Pt states overall good compliance with meds, mostly trying to follow appropriate diet, with wt overall stable Recent mar 1 UTI resolved. Denies urinary symptoms such as dysuria, frequency, urgency, flank pain, hematuria or n/v, fever, chills. Wt Readings from Last 3 Encounters:  12/26/17 124 lb (56.2 kg)  12/21/17 120 lb (54.4 kg)  12/06/17 122 lb 6.4 oz (55.5 kg)  Pain from recent trauma to pelvis and left elbow resolved.  Has f/u with Dr Marlou Sa soon/[facility] may 10.  Still getting PT at [facility], and doing excericses.  No other interval hx or new complaint Past Medical History:  Diagnosis Date  . Anxiety   . Benign fundic gland polyps of stomach   . Benign gastrointestinal stromal tumor (GIST)   . Bronchitis   . Cancer ([place])    recent skin cancer left leg  excised about 8 days ago-remains with dressing intact.   . Chronic cystitis   . COLONIC POLYPS, HX OF   . Complication of anesthesia   . Diverticulosis   . DYSLIPIDEMIA   . Gastritis   . GERD (gastroesophageal reflux disease)   . Hemorrhoids   . Hiatal hernia   . HYPERTENSION   . Macular degeneration    optho q102mo - Hecker  . Meningioma ([place])   . MITRAL VALVE PROLAPSE   . NEPHROLITHIASIS   . OSTEOARTHRITIS   . OSTEOPOROSIS   . PONV (postoperative nausea and vomiting)   . Thoracic aortic aneurysm (HCC)    4.1 cm 2015   Past Surgical History:  Procedure Laterality Date  . ABDOMINAL HYSTERECTOMY    . APPENDECTOMY    . BREAST SURGERY    . [place]   no PCI, performed in [place]    . Cataract surgery  2000  . CHOLECYSTECTOMY    . COLONOSCOPY    . ESOPHAGOGASTRODUODENOSCOPY    . EUS N/A 05/16/2014   Procedure: UPPER ENDOSCOPIC ULTRASOUND (EUS) LINEAR;  Surgeon: Milus Banister, MD;  [facility];  Service: Endoscopy;  Laterality: N/A;  . INSERTION OF MESH  07/04/2012   Procedure: INSERTION OF MESH;  Surgeon: Odis Hollingshead, MD;  Location: [place];  Service: General;  Laterality: N/A;  . KNEE SURGERY    . LEG SKIN LESION  BIOPSY / EXCISION Left    8 days ago pending pathology, remains with dressing.  . SURGERY OF LIP  in 2002  . TONSILLECTOMY    . VENTRAL HERNIA REPAIR  07/04/2012   Procedure: HERNIA REPAIR VENTRAL ADULT;  Surgeon: Odis Hollingshead, MD;  Location: [place];  Service: General;  Laterality: N/A;    reports that she has never smoked. She has never used smokeless tobacco. She reports that she drinks alcohol. She reports that she does not use drugs. family history includes Breast cancer in her sister; Emphysema in her father; Heart disease in her father, mother, and other; Lung cancer in her sister; Prostate cancer in her brother and son. Allergies  Allergen Reactions  . Pentothal [Thiopental] Nausea And Vomiting  . Codeine   .  Levofloxacin   . Oxycodone-Acetaminophen   . Sulfonamide Derivatives     REACTION: GI upset/nausea/vomiting  . Tape Rash    Per patient adhesive tape   Current Outpatient Medications on File Prior to Visit  Medication Sig Dispense Refill  . acetaminophen (TYLENOL) 500 MG tablet Take 500 mg by mouth daily as needed.     . Ascorbic Acid (VITAMIN C) 500 MG tablet Take 500 mg by mouth daily.      Marland Kitchen aspirin 81 MG tablet Take 81 mg by mouth at bedtime.     . calcium-vitamin D (CALCIUM 500+D) 500-200 MG-UNIT per tablet Take 1 tablet by mouth daily.      . Cholecalciferol (VITAMIN D3) 2000 UNITS TABS Take 2,000 Units by mouth daily.      Marland Kitchen lactose free nutrition (BOOST) LIQD Take 237 mLs by mouth daily.    Marland Kitchen losartan  (COZAAR) 50 MG tablet TAKE 1 TABLET BY MOUTH EVERY MORNING 90 tablet 3  . Multiple Vitamins-Minerals (PRESERVISION AREDS 2 PO) Take 1 tablet by mouth daily.    Marland Kitchen omeprazole (PRILOSEC) 20 MG capsule Take 20 mg by mouth 2 (two) times daily before a meal.    . simvastatin (ZOCOR) 5 MG tablet TAKE 1 TABLET BY MOUTH AT BEDTIME. 90 tablet 3  . vitamin E 400 UNIT capsule Take 400 Units by mouth every morning.     No current facility-administered medications on file prior to visit.    Review of Systems  Constitutional: Negative for other unusual diaphoresis or sweats HENT: Negative for ear discharge or swelling Eyes: Negative for other worsening visual disturbances Respiratory: Negative for stridor or other swelling  Gastrointestinal: Negative for worsening distension or other blood Genitourinary: Negative for retention or other urinary change Musculoskeletal: Negative for other MSK pain or swelling Skin: Negative for color change or other new lesions Neurological: Negative for worsening tremors and other numbness  Psychiatric/Behavioral: Negative for worsening agitation or other fatigue All other system neg per pt    Objective:   Physical Exam BP 112/76   Pulse 71   Temp 98 F (36.7 C) (Oral)   Ht 5\' 4"  (1.626 m)   Wt 124 lb (56.2 kg)   SpO2 95%   BMI 21.28 kg/m  VS noted,  Constitutional: Pt appears in NAD HENT: Head: NCAT.  Right Ear: External ear normal.  Left Ear: External ear normal.  Eyes: . Pupils are equal, round, and reactive to light. Conjunctivae and EOM are normal Nose: without d/c or deformity Neck: Neck supple. Gross normal ROM Cardiovascular: Normal rate and regular rhythm.   Pulmonary/Chest: Effort normal and breath sounds without rales or wheezing.  Abd:  Soft, NT, ND, + BS, no organomegaly Neurological: Pt is alert. At baseline orientation, motor grossly intact Skin: Skin is warm. No rashes, other new lesions, no LE edema Psychiatric: Pt behavior is normal  without agitation   No other exam findings     Assessment & Plan:

## 2018-01-04 ENCOUNTER — Encounter (INDEPENDENT_AMBULATORY_CARE_PROVIDER_SITE_OTHER): Payer: Self-pay | Admitting: Orthopedic Surgery

## 2018-01-04 ENCOUNTER — Ambulatory Visit (INDEPENDENT_AMBULATORY_CARE_PROVIDER_SITE_OTHER): Payer: Medicare Other

## 2018-01-04 ENCOUNTER — Ambulatory Visit (INDEPENDENT_AMBULATORY_CARE_PROVIDER_SITE_OTHER): Payer: Medicare Other | Admitting: Orthopedic Surgery

## 2018-01-04 DIAGNOSIS — S42402S Unspecified fracture of lower end of left humerus, sequela: Secondary | ICD-10-CM

## 2018-01-04 NOTE — Progress Notes (Signed)
Post-Op Visit Note   Patient: Chelsea Jordan           Date of Birth: [birth year]           MRN: [id number] Visit Date: 01/04/2018 PCP: Biagio Borg, MD   Assessment & Plan:  Chief Complaint:  Chief Complaint  Patient presents with  . Left Elbow - Follow-up   Visit Diagnoses:  1. Left elbow fracture, sequela     Plan: Ceilidh is a patient is now 2 months out left olecranon fracture and pelvic fractures.  She is been doing well.  She is been weightbearing as tolerated.  On exam she has full range of motion of that left elbow and good triceps strength.  Radiographs show good fracture alignment.  Plan at this time is activity as tolerated follow-up as needed  Follow-Up Instructions: Return if symptoms worsen or fail to improve.   Orders:  Orders Placed This Encounter  Procedures  . XR Elbow Complete Left (3+View)   No orders of the defined types were placed in this encounter.   Imaging: No results found.  PMFS History: Patient Active Problem List   Diagnosis Date Noted  . Left elbow fracture, sequela 11/25/2017  . Olecranon fracture, left, closed, initial encounter 11/05/2017  . Memory dysfunction 02/09/2017  . Preventative health care 07/15/2016  . Hyponatremia 12/25/2014  . Lightheadedness 12/25/2014  . Aortic stenosis 12/20/2014  . General weakness 12/18/2014  . Nausea 12/18/2014  . 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 Medicare annual wellness visit, subsequent 07/12/2014  . Thoracic aortic aneurysm ([place]) 07/08/2014  . Recurrent UTI 03/12/2014  . Upper airway cough syndrome 01/03/2014  . Eustachian tube dysfunction 08/16/2013  . Macular degeneration   . GERD (gastroesophageal reflux disease)   . Encounter for long-term (current) use of other medications 12/07/2010  . Mitral valve disorder 11/17/2009  . Hyperlipidemia 09/24/2009  . Essential hypertension 09/24/2009  . GENERALIZED OSTEOARTHROSIS  UNSPECIFIED SITE 09/24/2009  . OSTEOPOROSIS 07/15/2009  . MENINGIOMA 10/20/2007  . 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 Asthma 10/20/2007  . NEPHROLITHIASIS 10/20/2007   Past Medical History:  Diagnosis Date  . Anxiety   . Benign fundic gland polyps of stomach   . Benign gastrointestinal stromal tumor (GIST)   . Bronchitis   . Cancer ([place])    recent skin cancer left leg  excised about 8 days ago-remains with dressing intact.   . Chronic cystitis   . COLONIC POLYPS, HX OF   . Complication of anesthesia   . Diverticulosis   . DYSLIPIDEMIA   . Gastritis   . GERD (gastroesophageal reflux disease)   . Hemorrhoids   . Hiatal hernia   . HYPERTENSION   . Macular degeneration    optho q78mo - Hecker  . Meningioma ([place])   . MITRAL VALVE PROLAPSE   . NEPHROLITHIASIS   . OSTEOARTHRITIS   . OSTEOPOROSIS   . PONV (postoperative nausea and vomiting)   . Thoracic aortic aneurysm ([facility])    4.1 cm 2015    Family History  Problem Relation Age of Onset  . Heart disease Other   . Heart disease Mother   . Heart disease Father   . Emphysema Father   . Breast cancer Sister   . Lung cancer Sister   . Prostate cancer Son   . Prostate cancer Brother     Past Surgical History:  Procedure Laterality Date  . ABDOMINAL HYSTERECTOMY    .  APPENDECTOMY    . BREAST SURGERY    . [place]   no PCI, performed in [place]  . Cataract surgery  2000  . CHOLECYSTECTOMY    . COLONOSCOPY    . ESOPHAGOGASTRODUODENOSCOPY    . EUS N/A 05/16/2014   Procedure: UPPER ENDOSCOPIC ULTRASOUND (EUS) LINEAR;  Surgeon: Milus Banister, MD;  Location[facility];  Service: Endoscopy;  Laterality: N/A;  . INSERTION OF MESH  07/04/2012   Procedure: INSERTION OF MESH;  Surgeon: Odis Hollingshead, MD;  Location: [place];  Service: General;  Laterality: N/A;  . KNEE SURGERY    . LEG SKIN LESION  BIOPSY / EXCISION Left    8 days ago pending pathology, remains with dressing.  . SURGERY OF LIP  in  2002  . TONSILLECTOMY    . VENTRAL HERNIA REPAIR  07/04/2012   Procedure: HERNIA REPAIR VENTRAL ADULT;  Surgeon: Odis Hollingshead, MD;  Location: [place];  Service: General;  Laterality: N/A;   Social History   Occupational History  . Occupation: retired  Tobacco Use  . Smoking status: Never Smoker  . Smokeless tobacco: Never Used  . Tobacco comment: Married, lives with spouse, Enjoys golf. Retired  Substance and Sexual Activity  . Alcohol use: Yes    Alcohol/week: 0.0 oz    Comment: Wine -nightly  . Drug use: No  . Sexual activity: Not Currently

## 2018-01-09 ENCOUNTER — Telehealth (INDEPENDENT_AMBULATORY_CARE_PROVIDER_SITE_OTHER): Payer: Self-pay | Admitting: Orthopedic Surgery

## 2018-01-09 NOTE — Telephone Encounter (Signed)
Please advise. Thanks.  

## 2018-01-09 NOTE — Telephone Encounter (Signed)
IC advised. Verbalized understanding.  

## 2018-01-09 NOTE — Telephone Encounter (Signed)
y

## 2018-01-09 NOTE — Telephone Encounter (Signed)
Patient called asked if she can discontinue her (PT) Patient said she can do the (PT) herself. The number to contact patient is [phone]

## 2018-02-14 NOTE — Progress Notes (Signed)
HPI The patient returns for one year followup.  She has a 4.1 cm thoracic aneurysm on previous CT.   It was 4.38 on the recent echo.  CT to evaluate GIST tumor demonstrated that the ascending thoracic aorta was 4.2 x 4.3.  At the last appt she fell and broke her pelvis in our parking lot.    She did not have to have surgery..  She also broke her elbow.  She did rehab and she is doing well.  She is able to walk the equivalent of 3 blocks at [facility].  She denies any shortness of breath, PND or orthopnea.  She may rarely get some exertional chest discomfort but she typically can exercise with the walking, physical therapy, going to the mailbox and riding a bicycle without bringing on any cardiovascular symptoms.  She is most worried about a prominent carotid artery because her mother had to have surgery and she was told by another doctor that if it ruptured she would die.  Allergies  Allergen Reactions  . Pentothal [Thiopental] Nausea And Vomiting  . Codeine   . Levofloxacin   . Oxycodone-Acetaminophen   . Sulfonamide Derivatives     REACTION: GI upset/nausea/vomiting  . Tape Rash    Per patient adhesive tape    Current Outpatient Medications  Medication Sig Dispense Refill  . acetaminophen (TYLENOL) 500 MG tablet Take 500 mg by mouth daily as needed.     . Ascorbic Acid (VITAMIN C) 500 MG tablet Take 500 mg by mouth daily.      Marland Kitchen aspirin 81 MG tablet Take 81 mg by mouth at bedtime.     . calcium-vitamin D (CALCIUM 500+D) 500-200 MG-UNIT per tablet Take 1 tablet by mouth daily.      . Cholecalciferol (VITAMIN D3) 2000 UNITS TABS Take 2,000 Units by mouth daily.      Marland Kitchen lactose free nutrition (BOOST) LIQD Take 237 mLs by mouth daily.    Marland Kitchen losartan (COZAAR) 50 MG tablet TAKE 1 TABLET BY MOUTH EVERY MORNING 90 tablet 3  . Multiple Vitamins-Minerals (PRESERVISION AREDS 2 PO) Take 1 tablet by mouth daily.    Marland Kitchen omeprazole (PRILOSEC) 20 MG capsule Take 20 mg by mouth 2 (two) times  daily before a meal.    . simvastatin (ZOCOR) 5 MG tablet TAKE 1 TABLET BY MOUTH AT BEDTIME. 90 tablet 3  . vitamin E 400 UNIT capsule Take 400 Units by mouth every morning.     No current facility-administered medications for this visit.     Past Medical History:  Diagnosis Date  . Anxiety   . Benign fundic gland polyps of stomach   . Benign gastrointestinal stromal tumor (GIST)   . Bronchitis   . Cancer ([place])    recent skin cancer left leg  excised about 8 days ago-remains with dressing intact.   . Chronic cystitis   . COLONIC POLYPS, HX OF   . Complication of anesthesia   . Diverticulosis   . DYSLIPIDEMIA   . Gastritis   . GERD (gastroesophageal reflux disease)   . Hemorrhoids   . Hiatal hernia   . HYPERTENSION   . Macular degeneration    optho q35mo - Hecker  . Meningioma ([place])   . MITRAL VALVE PROLAPSE   . NEPHROLITHIASIS   . OSTEOARTHRITIS   . OSTEOPOROSIS   . PONV (postoperative nausea and vomiting)   . Thoracic aortic aneurysm ([facility])    4.1 cm 2015  Past Surgical History:  Procedure Laterality Date  . ABDOMINAL HYSTERECTOMY    . APPENDECTOMY    . BREAST SURGERY    . [place]   no PCI, performed in [place]  . Cataract surgery  2000  . CHOLECYSTECTOMY    . COLONOSCOPY    . ESOPHAGOGASTRODUODENOSCOPY    . EUS N/A 05/16/2014   Procedure: UPPER ENDOSCOPIC ULTRASOUND (EUS) LINEAR;  Surgeon: Milus Banister, MD;  [facility];  Service: Endoscopy;  Laterality: N/A;  . INSERTION OF MESH  07/04/2012   Procedure: INSERTION OF MESH;  Surgeon: Odis Hollingshead, MD;  Location: [place];  Service: General;  Laterality: N/A;  . KNEE SURGERY    . LEG SKIN LESION  BIOPSY / EXCISION Left    8 days ago pending pathology, remains with dressing.  . SURGERY OF LIP  in 2002  . TONSILLECTOMY    . VENTRAL HERNIA REPAIR  07/04/2012   Procedure: HERNIA REPAIR VENTRAL ADULT;  Surgeon: Odis Hollingshead, MD;  Location: [place];  Service: General;   Laterality: N/A;    ROS:   As stated in the HPI and negative for all other systems.  PHYSICAL EXAM BP 130/86   Pulse 67   Ht 5\' 5"  (1.651 m)   Wt 126 lb (57.2 kg)   BMI 20.97 kg/m   GENERAL:  Well appearing NECK:  No jugular venous distention, waveform within normal limits, carotid upstroke brisk and symmetric, no bruits, no thyromegaly LUNGS:  Clear to auscultation bilaterally CHEST:  Unremarkable HEART:  PMI not displaced or sustained,S1 and S2 within normal limits, no S3, no S4, no clicks, no rubs, , 2 out of 6 apical systolic murmur radiating slightly at the right upper tract no diastolic murmurs ABD:  Flat, positive bowel sounds normal in frequency in pitch, no bruits, no rebound, no guarding, no midline pulsatile mass, no hepatomegaly, no splenomegaly EXT:  2 plus pulses throughout, no edema, no cyanosis no clubbing   EKG:   Sinus rhythm, rate 67, axis within normal limits, intervals within normal limits, no acute ST-T wave changes. 02/15/2018   ASSESSMENT AND PLAN   AS:    This was mild.  No change in therapy.    MVP:  This was mild but no change in therapy.  HTN:   The blood pressure is at target. No change in medications is indicated. We will continue with therapeutic lifestyle changes (TLC).  THORACIC ANEURYSM:   I will order a CT of the chest and neck for Sept to follow up.    BRUIT:  She had mild bilateral plaque.    This will be further evaluated with the CT.

## 2018-02-15 ENCOUNTER — Encounter: Payer: Self-pay | Admitting: Cardiology

## 2018-02-15 ENCOUNTER — Ambulatory Visit: Payer: Medicare Other | Admitting: Cardiology

## 2018-02-15 VITALS — BP 130/86 | HR 67 | Ht 65.0 in | Wt 126.0 lb

## 2018-02-15 DIAGNOSIS — I6523 Occlusion and stenosis of bilateral carotid arteries: Secondary | ICD-10-CM

## 2018-02-15 DIAGNOSIS — I10 Essential (primary) hypertension: Secondary | ICD-10-CM | POA: Diagnosis not present

## 2018-02-15 DIAGNOSIS — I712 Thoracic aortic aneurysm, without rupture, unspecified: Secondary | ICD-10-CM

## 2018-02-15 DIAGNOSIS — I059 Rheumatic mitral valve disease, unspecified: Secondary | ICD-10-CM

## 2018-02-15 NOTE — Patient Instructions (Signed)
Medication Instructions:  Continue current medications  If you need a refill on your cardiac medications before your next appointment, please call your pharmacy.  Labwork: None Ordered    Testing/Procedures: Your physician has requested that you have cardiac CT in Septemtber. Cardiac computed tomography (CT) is a painless test that uses an x-ray machine to take clear, detailed pictures of your heart. For further information please visit [email or web address]. Please follow instruction sheet as given.   Follow-Up: Your physician wants you to follow-up in: After CT in September.     Thank you for choosing [facility]!!

## 2018-02-15 NOTE — Addendum Note (Signed)
Addended by: Vennie Homans on: 02/15/2018 11:19 AM   Modules accepted: Orders

## 2018-02-20 ENCOUNTER — Telehealth: Payer: Self-pay | Admitting: Oncology

## 2018-02-20 ENCOUNTER — Inpatient Hospital Stay: Payer: Medicare Other | Attending: Oncology | Admitting: Oncology

## 2018-02-20 VITALS — BP 134/76 | HR 68 | Temp 97.8°F | Resp 16 | Ht 65.0 in | Wt 126.3 lb

## 2018-02-20 DIAGNOSIS — C49A0 Gastrointestinal stromal tumor, unspecified site: Secondary | ICD-10-CM

## 2018-02-20 DIAGNOSIS — Z87440 Personal history of urinary (tract) infections: Secondary | ICD-10-CM | POA: Diagnosis not present

## 2018-02-20 DIAGNOSIS — C7652 Malignant neoplasm of left lower limb: Secondary | ICD-10-CM | POA: Diagnosis not present

## 2018-02-20 DIAGNOSIS — C49A1 Gastrointestinal stromal tumor of esophagus: Secondary | ICD-10-CM | POA: Insufficient documentation

## 2018-02-20 DIAGNOSIS — R1319 Other dysphagia: Secondary | ICD-10-CM | POA: Diagnosis not present

## 2018-02-20 NOTE — Telephone Encounter (Signed)
Scheduled appt per 6/17 los - gave patient aVS and calender per los.  

## 2018-02-20 NOTE — Progress Notes (Signed)
  Chelsea Jordan OFFICE PROGRESS NOTE   Diagnosis: Gastrointestinal stromal tumor  INTERVAL HISTORY:   Chelsea Jordan returns as scheduled.  She was admitted in March after a fall.  She fractured the left olecranon and pubic rami.  She had a stay on the rehabilitation unit at [facility].  She reports a decreased energy level.  No dysphasia.  Good appetite.  Objective:  Vital signs in last 24 hours:  Blood pressure 134/76, pulse 68, temperature 97.8 F (36.6 C), temperature source Oral, resp. rate 16, height 5\' 5"  (1.651 m), weight 126 lb 4.8 oz (57.3 kg), SpO2 97 %.    HEENT: Neck without mass Lymphatics: No cervical or supraclavicular nodes Resp: Lungs clear bilaterally Cardio: Regular rate and rhythm GI: No hepatomegaly, nontender, no mass Vascular: No leg edema   Lab Results:  Lab Results  Component Value Date   WBC 7.4 12/26/2017   HGB 13.3 12/26/2017   HCT 39.7 12/26/2017   MCV 92.8 12/26/2017   PLT 190.0 12/26/2017   NEUTROABS 5.1 12/26/2017    CMP  Lab Results  Component Value Date   NA 142 12/26/2017   K 4.1 12/26/2017   CL 103 12/26/2017   CO2 27 12/26/2017   GLUCOSE 90 12/26/2017   BUN 13 12/26/2017   CREATININE 0.51 12/26/2017   CALCIUM 9.5 12/26/2017   PROT 6.2 12/26/2017   ALBUMIN 4.0 12/26/2017   AST 17 12/26/2017   ALT 14 12/26/2017   ALKPHOS 126 (H) 12/26/2017   BILITOT 0.6 12/26/2017   GFRNONAA >60 11/04/2017   GFRAA >60 11/04/2017    Medications: I have reviewed the patient's current medications.   Assessment/Plan: 1. 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 2.6 cm muscularis propria mass at 30 cm from the incisors, 9 mm similar appearing lesion at the GE junction ? CT chest 05/16/2017-no change in the distal esophagus mass or lesion at the GE junction  2. History of pill dysphagia secondary to #1  3. Chronic urinary tract infections  4. Squamous cell  carcinoma of the left lower leg August 2015      Disposition: Chelsea Jordan appears stable.  There is no clinical evidence for progression of the gastrointestinal stromal tumor.  She will contact us for new symptoms.  She would like to continue follow-up at the Cancer center.  Chelsea Jordan will return for an office visit in 9 months.  15 minutes were spent with the patient today.  The majority of the time was used for counseling and coordination of care.  Betsy Coder, MD  02/20/2018  11:56 AM

## 2018-03-11 ENCOUNTER — Other Ambulatory Visit: Payer: Self-pay | Admitting: Internal Medicine

## 2018-04-18 LAB — BASIC METABOLIC PANEL
BUN/Creatinine Ratio: 27 (ref 12–28)
BUN: 13 mg/dL (ref 8–27)
CO2: 26 mmol/L (ref 20–29)
CREATININE: 0.49 mg/dL — AB (ref 0.57–1.00)
Calcium: 10 mg/dL (ref 8.7–10.3)
Chloride: 101 mmol/L (ref 96–106)
GFR, EST AFRICAN AMERICAN: 100 mL/min/{1.73_m2} (ref >59)
GFR, EST NON AFRICAN AMERICAN: 87 mL/min/{1.73_m2} (ref >59)
Glucose: 101 mg/dL — ABNORMAL HIGH (ref 65–99)
POTASSIUM: 4.4 mmol/L (ref 3.5–5.2)
SODIUM: 141 mmol/L (ref 134–144)

## 2018-05-10 ENCOUNTER — Ambulatory Visit (INDEPENDENT_AMBULATORY_CARE_PROVIDER_SITE_OTHER)
Admission: RE | Admit: 2018-05-10 | Discharge: 2018-05-10 | Disposition: A | Discharge status: Home / Self Care | Payer: Medicare Other | Source: Ambulatory Visit | Attending: Cardiology | Admitting: Cardiology

## 2018-05-10 DIAGNOSIS — I712 Thoracic aortic aneurysm, without rupture, unspecified: Secondary | ICD-10-CM

## 2018-05-10 DIAGNOSIS — I6523 Occlusion and stenosis of bilateral carotid arteries: Secondary | ICD-10-CM | POA: Diagnosis not present

## 2018-05-10 MED ORDER — IOPAMIDOL (ISOVUE-370) INJECTION 76%
100.0000 mL | Freq: Once | INTRAVENOUS | Status: AC | PRN
  Administered 2018-05-10: 80 mL via INTRAVENOUS

## 2018-05-17 NOTE — Progress Notes (Signed)
HPI The patient returns for one year followup.  She has a 4.1 cm thoracic aneurysm on previous CT.   It was 4.38 on the recent echo.  CT to evaluate GIST tumor demonstrated that the ascending thoracic aorta was 4.2 x 4.3.  At a prevoius appt she fell and broke her pelvis in our parking lot.    Since I last saw her she has no new complaints.  She walks with a cane and she walks quickly per her husband. The patient denies any new symptoms such as chest discomfort, neck or arm discomfort. There has been no new shortness of breath, PND or orthopnea. There have been no reported palpitations, presyncope or syncope.  Every time she comes she has the same complaint which is that the vein in her neck "pops up" and this worries her.  She was told that if it burst she would die.  We have imaged this and reassured her at every single visit that she is not at risk for this.  There is clearly a short-term memory deficit.  Allergies  Allergen Reactions  . Pentothal [Thiopental] Nausea And Vomiting  . Codeine   . Levofloxacin   . Oxycodone-Acetaminophen   . Sulfonamide Derivatives     REACTION: GI upset/nausea/vomiting  . Tape Rash    Per patient adhesive tape    Current Outpatient Medications  Medication Sig Dispense Refill  . acetaminophen (TYLENOL) 500 MG tablet Take 500 mg by mouth daily as needed.     . Ascorbic Acid (VITAMIN C) 500 MG tablet Take 500 mg by mouth daily.      Marland Kitchen aspirin 81 MG tablet Take 81 mg by mouth at bedtime.     . calcium-vitamin D (CALCIUM 500+D) 500-200 MG-UNIT per tablet Take 1 tablet by mouth daily.      . Cholecalciferol (VITAMIN D3) 2000 UNITS TABS Take 2,000 Units by mouth daily.      Marland Kitchen esomeprazole (NEXIUM) 40 MG capsule TAKE 1 CAPSULE BY MOUTH 30-60 MIN BEFORE YOUR FIRST AND LAST MEAL OF THE DAY 90 capsule 0  . lactose free nutrition (BOOST) LIQD Take 237 mLs by mouth daily.    Marland Kitchen losartan (COZAAR) 50 MG tablet TAKE 1 TABLET BY MOUTH EVERY MORNING 90 tablet 3  .  Multiple Vitamins-Minerals (PRESERVISION AREDS 2 PO) Take 1 tablet by mouth daily.    Marland Kitchen omeprazole (PRILOSEC) 20 MG capsule Take 20 mg by mouth 2 (two) times daily before a meal.    . simvastatin (ZOCOR) 5 MG tablet TAKE 1 TABLET BY MOUTH AT BEDTIME. 90 tablet 3  . vitamin E 400 UNIT capsule Take 400 Units by mouth every morning.     No current facility-administered medications for this visit.     Past Medical History:  Diagnosis Date  . Anxiety   . Benign fundic gland polyps of stomach   . Benign gastrointestinal stromal tumor (GIST)   . Bronchitis   . Cancer ([place])    recent skin cancer left leg  excised about 8 days ago-remains with dressing intact.   . Chronic cystitis   . COLONIC POLYPS, HX OF   . Complication of anesthesia   . Diverticulosis   . DYSLIPIDEMIA   . Gastritis   . GERD (gastroesophageal reflux disease)   . Hemorrhoids   . Hiatal hernia   . HYPERTENSION   . Macular degeneration    optho q50mo - Hecker  . Meningioma ([place])   . MITRAL VALVE PROLAPSE   .  NEPHROLITHIASIS   . OSTEOARTHRITIS   . OSTEOPOROSIS   . PONV (postoperative nausea and vomiting)   . Thoracic aortic aneurysm ([facility])    4.1 cm 2015    Past Surgical History:  Procedure Laterality Date  . ABDOMINAL HYSTERECTOMY    . APPENDECTOMY    . BREAST SURGERY    . [place]   no PCI, performed in [place]  . Cataract surgery  2000  . CHOLECYSTECTOMY    . COLONOSCOPY    . ESOPHAGOGASTRODUODENOSCOPY    . EUS N/A 05/16/2014   Procedure: UPPER ENDOSCOPIC ULTRASOUND (EUS) LINEAR;  Surgeon: Milus Banister, MD;  [facility];  Service: Endoscopy;  Laterality: N/A;  . INSERTION OF MESH  07/04/2012   Procedure: INSERTION OF MESH;  Surgeon: Odis Hollingshead, MD;  Location: [place];  Service: General;  Laterality: N/A;  . KNEE SURGERY    . LEG SKIN LESION  BIOPSY / EXCISION Left    8 days ago pending pathology, remains with dressing.  . SURGERY OF LIP  in 2002  . TONSILLECTOMY      . VENTRAL HERNIA REPAIR  07/04/2012   Procedure: HERNIA REPAIR VENTRAL ADULT;  Surgeon: Odis Hollingshead, MD;  Location: [place];  Service: General;  Laterality: N/A;    ROS:   As stated in the HPI and negative for all other systems.  PHYSICAL EXAM BP 113/79   Pulse 74   Ht 5\' 5"  (1.651 m)   Wt 123 lb 9.6 oz (56.1 kg)   SpO2 96%   BMI 20.57 kg/m   GENERAL:  Well appearing NECK:  No jugular venous distention, waveform within normal limits, carotid upstroke brisk and symmetric, no bruits, no thyromegaly LUNGS:  Clear to auscultation bilaterally CHEST:  Unremarkable HEART:  PMI not displaced or sustained,S1 and S2 within normal limits, no S3, no S4, no clicks, no rubs, no murmurs ABD:  Flat, positive bowel sounds normal in frequency in pitch, no bruits, no rebound, no guarding, no midline pulsatile mass, no hepatomegaly, no splenomegaly EXT:  2 plus pulses throughout, no edema, no cyanosis no clubbing    EKG:   NA  ASSESSMENT AND PLAN   AS:     This is mild.  No further imaging is indicated.   MVP: This is mild.  No further read the imaging is indicated.  HTN:   Blood pressure is at target.  She will continue with the meds as listed.  THORACIC ANEURYSM:   This was stable on CT of 4.3 cm.  I will follow this up again likely in a year.  BRUIT:  She had mild bilateral plaque.   No further imaging is indicated.

## 2018-05-18 ENCOUNTER — Ambulatory Visit: Payer: Medicare Other | Admitting: Cardiology

## 2018-05-18 ENCOUNTER — Encounter: Payer: Self-pay | Admitting: Cardiology

## 2018-05-18 VITALS — BP 113/79 | HR 74 | Ht 65.0 in | Wt 123.6 lb

## 2018-05-18 DIAGNOSIS — I712 Thoracic aortic aneurysm, without rupture, unspecified: Secondary | ICD-10-CM

## 2018-05-18 NOTE — Patient Instructions (Signed)
Medication Instructions:  Your physician recommends that you continue on your current medications as directed. Please refer to the Current Medication list given to you today.   Labwork: None ordered  Testing/Procedures: None ordered  Follow-Up: Your physician wants you to follow-up in: 1 year with Dr.Hochrein You will receive a reminder letter in the mail two months in advance. If you don't receive a letter, please call our office to schedule the follow-up appointment.   Any Other Special Instructions Will Be Listed Below (If Applicable).     If you need a refill on your cardiac medications before your next appointment, please call your pharmacy.   

## 2018-06-21 ENCOUNTER — Ambulatory Visit: Payer: Medicare Other | Admitting: Internal Medicine

## 2018-06-22 ENCOUNTER — Other Ambulatory Visit (INDEPENDENT_AMBULATORY_CARE_PROVIDER_SITE_OTHER): Payer: Medicare Other

## 2018-06-22 ENCOUNTER — Encounter: Payer: Self-pay | Admitting: Internal Medicine

## 2018-06-22 ENCOUNTER — Ambulatory Visit: Payer: Medicare Other | Admitting: Internal Medicine

## 2018-06-22 VITALS — BP 118/76 | HR 73 | Temp 97.8°F | Ht 65.0 in | Wt 122.0 lb

## 2018-06-22 DIAGNOSIS — Z0000 Encounter for general adult medical examination without abnormal findings: Secondary | ICD-10-CM

## 2018-06-22 DIAGNOSIS — Z23 Encounter for immunization: Secondary | ICD-10-CM

## 2018-06-22 DIAGNOSIS — F0390 Unspecified dementia without behavioral disturbance: Secondary | ICD-10-CM | POA: Diagnosis not present

## 2018-06-22 LAB — URINALYSIS, ROUTINE W REFLEX MICROSCOPIC
Nitrite: NEGATIVE
SPECIFIC GRAVITY, URINE: 1.02 (ref 1.000–1.030)
UROBILINOGEN UA: 0.2 (ref 0.0–1.0)
Urine Glucose: NEGATIVE
pH: 6 (ref 5.0–8.0)

## 2018-06-22 LAB — CBC WITH DIFFERENTIAL/PLATELET
BASOS ABS: 0 10*3/uL (ref 0.0–0.1)
Basophils Relative: 0.4 % (ref 0.0–3.0)
EOS PCT: 4.3 % (ref 0.0–5.0)
Eosinophils Absolute: 0.3 10*3/uL (ref 0.0–0.7)
HEMATOCRIT: 43.5 % (ref 36.0–46.0)
Hemoglobin: 14.4 g/dL (ref 12.0–15.0)
Lymphocytes Relative: 17.1 % (ref 12.0–46.0)
Lymphs Abs: 1 10*3/uL (ref 0.7–4.0)
MCHC: 33 g/dL (ref 30.0–36.0)
MCV: 91.8 fl (ref 78.0–100.0)
MONOS PCT: 12.2 % — AB (ref 3.0–12.0)
Monocytes Absolute: 0.7 10*3/uL (ref 0.1–1.0)
NEUTROS PCT: 66 % (ref 43.0–77.0)
Neutro Abs: 3.9 10*3/uL (ref 1.4–7.7)
Platelets: 187 10*3/uL (ref 150.0–400.0)
RBC: 4.74 Mil/uL (ref 3.87–5.11)
RDW: 13.1 % (ref 11.5–15.5)
WBC: 5.9 10*3/uL (ref 4.0–10.5)

## 2018-06-22 LAB — BASIC METABOLIC PANEL
BUN: 15 mg/dL (ref 6–23)
CO2: 29 meq/L (ref 19–32)
CREATININE: 0.57 mg/dL (ref 0.40–1.20)
Calcium: 9.8 mg/dL (ref 8.4–10.5)
Chloride: 101 mEq/L (ref 96–112)
GFR: 106.06 mL/min (ref >60.00)
Glucose, Bld: 97 mg/dL (ref 70–99)
Potassium: 3.9 mEq/L (ref 3.5–5.1)
Sodium: 138 mEq/L (ref 135–145)

## 2018-06-22 LAB — HEPATIC FUNCTION PANEL
ALBUMIN: 4.3 g/dL (ref 3.5–5.2)
ALK PHOS: 80 U/L (ref 39–117)
ALT: 13 U/L (ref 0–35)
AST: 16 U/L (ref 0–37)
Bilirubin, Direct: 0.1 mg/dL (ref 0.0–0.3)
TOTAL PROTEIN: 6.8 g/dL (ref 6.0–8.3)
Total Bilirubin: 0.5 mg/dL (ref 0.2–1.2)

## 2018-06-22 LAB — TSH: TSH: 2.2 u[IU]/mL (ref 0.35–4.50)

## 2018-06-22 LAB — LIPID PANEL
CHOL/HDL RATIO: 2
Cholesterol: 184 mg/dL (ref 0–200)
HDL: 86.7 mg/dL (ref >39.00)
LDL Cholesterol: 81 mg/dL (ref 0–99)
NonHDL: 97.42
TRIGLYCERIDES: 81 mg/dL (ref 0.0–149.0)
VLDL: 16.2 mg/dL (ref 0.0–40.0)

## 2018-06-22 MED ORDER — DONEPEZIL HCL 5 MG PO TABS: 5.0000 mg | ORAL_TABLET | Freq: Every day | ORAL | 3 refills | Status: DC

## 2018-06-22 MED ORDER — ZOSTER VAC RECOMB ADJUVANTED 50 MCG/0.5ML IM SUSR: 0.5000 mL | Freq: Once | INTRAMUSCULAR | 1 refills | Status: AC

## 2018-06-22 MED ORDER — LOSARTAN POTASSIUM 50 MG PO TABS: 50.0000 mg | ORAL_TABLET | Freq: Every morning | ORAL | 3 refills | Status: DC

## 2018-06-22 MED ORDER — ESOMEPRAZOLE MAGNESIUM 40 MG PO CPDR: DELAYED_RELEASE_CAPSULE | ORAL | 3 refills | Status: DC

## 2018-06-22 MED ORDER — SIMVASTATIN 5 MG PO TABS: 5.0000 mg | ORAL_TABLET | Freq: Every day | ORAL | 3 refills | Status: DC

## 2018-06-22 NOTE — Assessment & Plan Note (Signed)

## 2018-06-22 NOTE — Patient Instructions (Addendum)
You had the flu shot today  Your shingles shot prescription was sent to the pharmacy  Please take all new medication as prescribed - the aricept at 5 mg per day  Please continue all other medications as before, and refills have been done if requested.  Please have the pharmacy call with any other refills you may need.  Please continue your efforts at being more active, low cholesterol diet, and weight control.  You are otherwise up to date with prevention measures today.  Please keep your appointments with your specialists as you may have planned  Please go to the LAB in the Basement (turn left off the elevator) for the tests to be done today  You will be contacted by phone if any changes need to be made immediately.  Otherwise, you will receive a letter about your results with an explanation, but please check with MyChart first.  Please remember to sign up for MyChart if you have not done so, as this will be important to you in the future with finding out test results, communicating by private email, and scheduling acute appointments online when needed.  Please return in 6 months, or sooner if needed

## 2018-06-22 NOTE — Assessment & Plan Note (Signed)
Mild recent worsening, for aricept 5 qd

## 2018-06-22 NOTE — Progress Notes (Signed)
Subjective:    Patient ID: Chelsea Jordan, female    DOB: [birth year], 84 y.o.   MRN: [id number]  HPI  Here for wellness and f/u;  Overall doing ok;  Pt denies Chest pain, worsening SOB, DOE, wheezing, orthopnea, PND, worsening LE edema, palpitations, dizziness or syncope.  Pt denies neurological change such as new headache, facial or extremity weakness.  Pt denies polydipsia, polyuria, or low sugar symptoms. Pt states overall good compliance with treatment and medications, good tolerability, and has been trying to follow appropriate diet.  Pt denies worsening depressive symptoms, suicidal ideation or panic. No fever, night sweats, wt loss, loss of appetite, or other constitutional symptoms.  Pt states good ability with ADL's, has low fall risk, home safety reviewed and adequate, no other significant changes in hearing or vision, and not active with exercise  Also , husband states much worse ST memory in last 6-9 months Past Medical History:  Diagnosis Date  . Anxiety   . Benign fundic gland polyps of stomach   . Benign gastrointestinal stromal tumor (GIST)   . Bronchitis   . Cancer ([place])    recent skin cancer left leg  excised about 8 days ago-remains with dressing intact.   . Chronic cystitis   . COLONIC POLYPS, HX OF   . Complication of anesthesia   . Diverticulosis   . DYSLIPIDEMIA   . Gastritis   . GERD (gastroesophageal reflux disease)   . Hemorrhoids   . Hiatal hernia   . HYPERTENSION   . Macular degeneration    optho q15mo - [facility]  . Meningioma ([place])   . MITRAL VALVE PROLAPSE   . NEPHROLITHIASIS   . OSTEOARTHRITIS   . OSTEOPOROSIS   . PONV (postoperative nausea and vomiting)   . Thoracic aortic aneurysm ([facility])    4.1 cm 2015   Past Surgical History:  Procedure Laterality Date  . ABDOMINAL HYSTERECTOMY    . APPENDECTOMY    . BREAST SURGERY    . [place]   no PCI, performed in [place]  . Cataract surgery  2000  . CHOLECYSTECTOMY    . COLONOSCOPY    .  ESOPHAGOGASTRODUODENOSCOPY    . EUS N/A 05/16/2014   Procedure: UPPER ENDOSCOPIC ULTRASOUND (EUS) LINEAR;  Surgeon: Milus Banister, MD;  Location: [facility];  Service: Endoscopy;  Laterality: N/A;  . INSERTION OF MESH  07/04/2012   Procedure: INSERTION OF MESH;  Surgeon: Odis Hollingshead, MD;  Location: [facility];  Service: General;  Laterality: N/A;  . KNEE SURGERY    . LEG SKIN LESION  BIOPSY / EXCISION Left    8 days ago pending pathology, remains with dressing.  . SURGERY OF LIP  in 2002  . TONSILLECTOMY    . VENTRAL HERNIA REPAIR  07/04/2012   Procedure: HERNIA REPAIR VENTRAL ADULT;  Surgeon: Odis Hollingshead, MD;  Location: [facility];  Service: General;  Laterality: N/A;    reports that she has never smoked. She has never used smokeless tobacco. She reports that she drinks alcohol. She reports that she does not use drugs. family history includes Breast cancer in her sister; Emphysema in her father; Heart disease in her father, mother, and other; Lung cancer in her sister; Prostate cancer in her brother and son. Allergies  Allergen Reactions  . Pentothal [Thiopental] Nausea And Vomiting  . Codeine   . Levofloxacin   . Oxycodone-Acetaminophen   . Sulfonamide Derivatives     REACTION: GI upset/nausea/vomiting  .  Tape Rash    Per patient adhesive tape   Current Outpatient Medications on File Prior to Visit  Medication Sig Dispense Refill  . acetaminophen (TYLENOL) 500 MG tablet Take 500 mg by mouth daily as needed.     . Ascorbic Acid (VITAMIN C) 500 MG tablet Take 500 mg by mouth daily.      Marland Kitchen aspirin 81 MG tablet Take 81 mg by mouth at bedtime.     . calcium-vitamin D (CALCIUM 500+D) 500-200 MG-UNIT per tablet Take 1 tablet by mouth daily.      . Cholecalciferol (VITAMIN D3) 2000 UNITS TABS Take 2,000 Units by mouth daily.      Marland Kitchen lactose free nutrition (BOOST) LIQD Take 237 mLs by mouth daily.    . Multiple Vitamins-Minerals (PRESERVISION AREDS 2 PO) Take 1 tablet by mouth daily.     . vitamin E 400 UNIT capsule Take 400 Units by mouth every morning.     No current facility-administered medications on file prior to visit.    Review of Systems Constitutional: Negative for other unusual diaphoresis, sweats, appetite or weight changes HENT: Negative for other worsening hearing loss, ear pain, facial swelling, mouth sores or neck stiffness.   Eyes: Negative for other worsening pain, redness or other visual disturbance.  Respiratory: Negative for other stridor or swelling Cardiovascular: Negative for other palpitations or other chest pain  Gastrointestinal: Negative for worsening diarrhea or loose stools, blood in stool, distention or other pain Genitourinary: Negative for hematuria, flank pain or other change in urine volume.  Musculoskeletal: Negative for myalgias or other joint swelling.  Skin: Negative for other color change, or other wound or worsening drainage.  Neurological: Negative for other syncope or numbness. Hematological: Negative for other adenopathy or swelling Psychiatric/Behavioral: Negative for hallucinations, other worsening agitation, SI, self-injury, or new decreased concentration All other system neg per pt    Objective:   Physical Exam BP 118/76   Pulse 73   Temp 97.8 F (36.6 C) (Oral)   Ht 5\' 5"  (1.651 m)   Wt 122 lb (55.3 kg)   SpO2 95%   BMI 20.30 kg/m  VS noted,  Constitutional: Pt is oriented to person, place, and time. Appears well-developed and well-nourished, in no significant distress and comfortable Head: Normocephalic and atraumatic  Eyes: Conjunctivae and EOM are normal. Pupils are equal, round, and reactive to light Right Ear: External ear normal without discharge Left Ear: External ear normal without discharge Nose: Nose without discharge or deformity Mouth/Throat: Oropharynx is without other ulcerations and moist  Neck: Normal range of motion. Neck supple. No JVD present. No tracheal deviation present or significant neck  LA or mass Cardiovascular: Normal rate, regular rhythm, normal heart sounds and intact distal pulses.   Pulmonary/Chest: WOB normal and breath sounds without rales or wheezing  Abdominal: Soft. Bowel sounds are normal. NT. No HSM  Musculoskeletal: Normal range of motion. Exhibits no edema Lymphadenopathy: Has no other cervical adenopathy.  Neurological: Pt is alert and oriented to person, place, and time. Pt has normal reflexes. No cranial nerve deficit. Motor grossly intact, Gait intact Skin: Skin is warm and dry. No rash noted or new ulcerations Psychiatric:  Has normal mood and affect. Behavior is normal without agitation No other exam findings Lab Results  Component Value Date   WBC 5.9 06/22/2018   HGB 14.4 06/22/2018   HCT 43.5 06/22/2018   PLT 187.0 06/22/2018   GLUCOSE 97 06/22/2018   CHOL 184 06/22/2018   TRIG 81.0  06/22/2018   HDL 86.70 06/22/2018   LDLDIRECT 97.7 06/22/2013   LDLCALC 81 06/22/2018   ALT 13 06/22/2018   AST 16 06/22/2018   NA 138 06/22/2018   K 3.9 06/22/2018   CL 101 06/22/2018   CREATININE 0.57 06/22/2018   BUN 15 06/22/2018   CO2 29 06/22/2018   TSH 2.20 06/22/2018   INR 0.94 06/28/2012       Assessment & Plan:

## 2018-06-27 ENCOUNTER — Ambulatory Visit: Payer: Medicare Other | Admitting: Internal Medicine

## 2018-09-22 ENCOUNTER — Other Ambulatory Visit: Payer: Self-pay | Admitting: Internal Medicine

## 2018-10-09 DIAGNOSIS — Z029 Encounter for administrative examinations, unspecified: Secondary | ICD-10-CM

## 2018-10-25 ENCOUNTER — Non-Acute Institutional Stay: Payer: Medicare Other | Admitting: Internal Medicine

## 2018-10-25 ENCOUNTER — Encounter: Payer: Self-pay | Admitting: Internal Medicine

## 2018-10-25 VITALS — BP 110/60 | HR 71 | Temp 97.8°F | Ht 65.0 in | Wt 122.6 lb

## 2018-10-25 DIAGNOSIS — C49A0 Gastrointestinal stromal tumor, unspecified site: Secondary | ICD-10-CM | POA: Diagnosis not present

## 2018-10-25 DIAGNOSIS — I10 Essential (primary) hypertension: Secondary | ICD-10-CM

## 2018-10-25 DIAGNOSIS — F0390 Unspecified dementia without behavioral disturbance: Secondary | ICD-10-CM

## 2018-10-25 DIAGNOSIS — I712 Thoracic aortic aneurysm, without rupture, unspecified: Secondary | ICD-10-CM

## 2018-10-25 DIAGNOSIS — M810 Age-related osteoporosis without current pathological fracture: Secondary | ICD-10-CM

## 2018-10-25 NOTE — Progress Notes (Signed)
Location:  [place] of Service:  Clinic (12)  Provider:   Code Status: Goals of Care:  Advanced Directives 10/25/2018  Does Patient Have a Medical Advance Directive? Yes  Type of Advance Directive [place]  Does patient want to make changes to medical advance directive? No - Patient declined  Copy of [place] in Chart? -     Chief Complaint  Patient presents with  . New Patient (Initial Visit)    establish care    HPI: Patient is a 85 y.o. female seen today for medical management of chronic diseases.   Patient has h/o HTN, Recurrent UTI on Macrodantin, h/o Thoracic Aneurysm, H/o GIST tumor, MVP , Aortic Stenosis mild, Dementia, h/o Osteoporosis , Also h/o Left Elbow fracture and Pelvic Fracture after a Fall in 05/19  Patient did not have any acute complains today. Lives in [place] with her husband. Walks with the [employer]. No falls recent;ly Independent in her ADL.Takes her own Meds But husband has noticed worsening Memory issues.. Like repeating herself.But still doing well with her Daily activities No Chest pain or SOB Patient does repeat herself that she has Cancer   Past Medical History:  Diagnosis Date  . Anxiety   . Benign fundic gland polyps of stomach   . Benign gastrointestinal stromal tumor (GIST)   . Bronchitis   . Cancer ([place])    recent skin cancer left leg  excised about 8 days ago-remains with dressing intact.   . Chronic cystitis   . COLONIC POLYPS, HX OF   . Complication of anesthesia   . Diverticulosis   . DYSLIPIDEMIA   . Gastritis   . GERD (gastroesophageal reflux disease)   . Hemorrhoids   . Hiatal hernia   . HYPERTENSION   . Macular degeneration    optho q54mo - [facility]  . Meningioma ([place])   . MITRAL VALVE PROLAPSE   . NEPHROLITHIASIS   . OSTEOARTHRITIS   . OSTEOPOROSIS   . PONV (postoperative nausea and vomiting)   . Thoracic aortic aneurysm ([facility])    4.1 cm 2015    Past Surgical  History:  Procedure Laterality Date  . ABDOMINAL HYSTERECTOMY    . APPENDECTOMY    . BREAST SURGERY    . [place]   no PCI, performed in [place]  . Cataract surgery  2000  . CHOLECYSTECTOMY    . COLONOSCOPY    . ESOPHAGOGASTRODUODENOSCOPY    . EUS N/A 05/16/2014   Procedure: UPPER ENDOSCOPIC ULTRASOUND (EUS) LINEAR;  Surgeon: Milus Banister, MD;  [facility];  Service: Endoscopy;  Laterality: N/A;  . INSERTION OF MESH  07/04/2012   Procedure: INSERTION OF MESH;  Surgeon: Odis Hollingshead, MD;  Location: [facility];  Service: General;  Laterality: N/A;  . KNEE SURGERY    . LEG SKIN LESION  BIOPSY / EXCISION Left    8 days ago pending pathology, remains with dressing.  . SURGERY OF LIP  in 2002  . TONSILLECTOMY    . VENTRAL HERNIA REPAIR  07/04/2012   Procedure: HERNIA REPAIR VENTRAL ADULT;  Surgeon: Odis Hollingshead, MD;  Location: [facility];  Service: General;  Laterality: N/A;    Allergies  Allergen Reactions  . Pentothal [Thiopental] Nausea And Vomiting  . Codeine   . Levofloxacin   . Oxycodone-Acetaminophen   . Sulfonamide Derivatives     REACTION: GI upset/nausea/vomiting  . Tape Rash    Per  patient adhesive tape    Outpatient Encounter Medications as of 10/25/2018  Medication Sig  . acetaminophen (TYLENOL) 500 MG tablet Take 500 mg by mouth daily as needed.   Marland Kitchen amoxicillin (AMOXIL) 500 MG tablet Take 500 mg by mouth. Prior to dental visits  . Ascorbic Acid (VITAMIN C) 500 MG tablet Take 500 mg by mouth daily.    Marland Kitchen aspirin 81 MG tablet Take 81 mg by mouth at bedtime.   . calcium-vitamin D (CALCIUM 500+D) 500-200 MG-UNIT per tablet Take 1 tablet by mouth daily.    . Cholecalciferol (VITAMIN D3) 2000 UNITS TABS Take 2,000 Units by mouth daily.    Marland Kitchen donepezil (ARICEPT) 5 MG tablet Take 1 tablet (5 mg total) by mouth daily.  Marland Kitchen esomeprazole (NEXIUM) 40 MG capsule TAKE 1 CAPSULE BY MOUTH 30-60 MIN BEFORE YOUR FIRST AND LAST MEAL OF THE DAY  . Multiple  Vitamins-Minerals (PRESERVISION AREDS 2 PO) Take 1 tablet by mouth daily.  . nitrofurantoin (MACRODANTIN) 100 MG capsule Take 100 mg by mouth daily.  . simvastatin (ZOCOR) 5 MG tablet Take 1 tablet (5 mg total) by mouth at bedtime.  Marland Kitchen telmisartan (MICARDIS) 40 MG tablet Please specify directions, refills and quantity  . vitamin E 400 UNIT capsule Take 400 Units by mouth every morning.  . [DISCONTINUED] lactose free nutrition (BOOST) LIQD Take 237 mLs by mouth daily.   No facility-administered encounter medications on file as of 10/25/2018.     Review of Systems:  Review of Systems  Review of Systems  Constitutional: Negative for activity change, appetite change, chills, diaphoresis, fatigue and fever.  HENT: Negative for mouth sores, postnasal drip, rhinorrhea, sinus pain and sore throat.   Respiratory: Negative for apnea, cough, chest tightness, shortness of breath and wheezing.   Cardiovascular: Negative for chest pain, palpitations and leg swelling.  Gastrointestinal: Negative for abdominal distention, abdominal pain, constipation, diarrhea, nausea and vomiting.  Genitourinary: Negative for dysuria and frequency.  Musculoskeletal: Negative for arthralgias, joint swelling and myalgias.  Skin: Negative for rash.  Neurological: Negative for dizziness, syncope, weakness, light-headedness and numbness.  Psychiatric/Behavioral: Negative for behavioral problems, confusion and sleep disturbance.     Health Maintenance  Topic Date Due  . TETANUS/TDAP  06/21/2026  . INFLUENZA VACCINE  Completed  . DEXA SCAN  Completed  . PNA vac Low Risk Adult  Completed    Physical Exam: Vitals:   10/25/18 1306  BP: 110/60  Pulse: 71  Temp: 97.8 F (36.6 C)  SpO2: 95%  Weight: 122 lb 9.6 oz (55.6 kg)  Height: 5\' 5"  (1.651 m)   Body mass index is 20.4 kg/m. Physical Exam Vitals signs reviewed.  Constitutional:      Appearance: She is normal weight.  HENT:     Head: Normocephalic.      Nose: Nose normal.     Mouth/Throat:     Mouth: Mucous membranes are moist.     Pharynx: Oropharynx is clear.  Eyes:     Pupils: Pupils are equal, round, and reactive to light.  Cardiovascular:     Rate and Rhythm: Normal rate and regular rhythm.     Heart sounds: Murmur present.  Pulmonary:     Effort: Pulmonary effort is normal. No respiratory distress.     Breath sounds: Normal breath sounds. No wheezing.  Abdominal:     General: Abdomen is flat. Bowel sounds are normal. There is no distension.     Palpations: Abdomen is soft.     Tenderness:  There is no abdominal tenderness.  Musculoskeletal:        General: No swelling.  Skin:    General: Skin is warm and dry.  Neurological:     General: No focal deficit present.     Mental Status: She is alert.     Comments: Forgot her age. Got confused about her DOB.   Psychiatric:        Mood and Affect: Mood normal.        Thought Content: Thought content normal.     Labs reviewed: Basic Metabolic Panel: Recent Labs    12/26/17 1345 04/18/18 0912 06/22/18 1529  NA 142 141 138  K 4.1 4.4 3.9  CL 103 101 101  CO2 27 26 29   GLUCOSE 90 101* 97  BUN 13 13 15   CREATININE 0.51 0.49* 0.57  CALCIUM 9.5 10.0 9.8  TSH 1.58  --  2.20   Liver Function Tests: Recent Labs    12/26/17 1345 06/22/18 1529  AST 17 16  ALT 14 13  ALKPHOS 126* 80  BILITOT 0.6 0.5  PROT 6.2 6.8  ALBUMIN 4.0 4.3   No results for input(s): LIPASE, AMYLASE in the last 8760 hours. No results for input(s): AMMONIA in the last 8760 hours. CBC: Recent Labs    11/06/17 0924 12/26/17 1345 06/22/18 1529  WBC 9.3 7.4 5.9  NEUTROABS 7.0 5.1 3.9  HGB 12.3 13.3 14.4  HCT 36.7 39.7 43.5  MCV 92.4 92.8 91.8  PLT 152 190.0 187.0   Lipid Panel: Recent Labs    12/26/17 1345 06/22/18 1529  CHOL 167 184  HDL 81.20 86.70  LDLCALC 71 81  TRIG 73.0 81.0  CHOLHDL 2 2   No results found for: HGBA1C  Procedures since last visit: No results  found.  Assessment/Plan Essential hypertension BP Controlled on Telmisartan Repeat BMP  Thoracic aortic aneurysm without rupture  Follows with Dr Jenkins [place] No New issues right now  Gastrointestinal stromal tumor (GIST) - esophagus Have Seen Dr Benay Spice Just monitoring .   Dementia  MRI in 2016 showed Mild to moderate small vessel disease type changes. Global atrophy without hydrocephalus. On Aricept MMSE exam in next visit   Age-related osteoporosis  Has been treated with Fosamax and Prolia before per notes from 2014 Will repeat DEXA scan  Recurrent UTI On Nitrofurantoin Follows with Urology Hyperlipidemia LDL 81 in 10/19 on statin    Labs/tests ordered Orders for Next Lab Next appt:  04/10/2019  Total time spent in this patient care encounter was _60 minutes; greater than 50% of the visit spent counseling patient, reviewing records , Labs and coordinating care for problems addressed at this encounter.

## 2018-11-09 ENCOUNTER — Other Ambulatory Visit: Payer: Self-pay | Admitting: Internal Medicine

## 2018-11-13 ENCOUNTER — Encounter: Payer: Self-pay | Admitting: Cardiology

## 2018-11-13 NOTE — Telephone Encounter (Signed)
error 

## 2018-11-27 ENCOUNTER — Telehealth: Payer: Self-pay | Admitting: Oncology

## 2018-11-27 ENCOUNTER — Inpatient Hospital Stay: Payer: Medicare Other | Admitting: Oncology

## 2018-11-27 NOTE — Telephone Encounter (Signed)
Tried to reach regarding 3/23 sch msg I did leave a message

## 2018-12-06 ENCOUNTER — Encounter: Payer: Self-pay | Admitting: Family

## 2018-12-06 ENCOUNTER — Other Ambulatory Visit: Payer: Self-pay

## 2018-12-06 ENCOUNTER — Encounter: Payer: Medicare Other | Admitting: Internal Medicine

## 2018-12-06 ENCOUNTER — Non-Acute Institutional Stay: Payer: Medicare Other | Admitting: Family

## 2018-12-06 VITALS — BP 118/72 | HR 76 | Temp 96.6°F | Ht 65.0 in | Wt 124.6 lb

## 2018-12-06 DIAGNOSIS — F0390 Unspecified dementia without behavioral disturbance: Secondary | ICD-10-CM

## 2018-12-06 MED ORDER — DONEPEZIL HCL 10 MG PO TABS: 10.0000 mg | ORAL_TABLET | Freq: Every day | ORAL | 3 refills | Status: DC

## 2018-12-06 NOTE — Progress Notes (Signed)
Location:  [place] of Service:  Clinic (12) Provider: Dinah Ngetich FNP-C  Virgie Dad, MD  Patient Care Team: Virgie Dad, MD as PCP - General (Internal Medicine) Minus Breeding, MD as Referring Physician (Cardiology) Tanda Rockers, MD as Referring Physician (Pulmonary Disease) Gatha Mayer, MD as Referring Physician (Gastroenterology) Gaynelle Arabian, MD as Referring Physician (Orthopedic Surgery) Melida Quitter, MD as Referring Physician Jackolyn Confer, MD (General Surgery)  Extended Emergency Contact Information Primary Emergency Contact: [facility] Address: [place], [place] [address] [name] of [place] Phone: [phone] Mobile Phone: [phone] Relation: Spouse Secondary Emergency Contact: Cheryle Horsfall [place] of [place] Phone: [phone] Mobile Phone: [phone] Relation: Son  Goals of care: Advanced Directive information Advanced Directives 12/06/2018  Does Patient Have a Medical Advance Directive? No  Type of Advance Directive -  Does patient want to make changes to medical advance directive? No - Patient declined  Copy of [place] in Chart? -  Would patient like information on creating a medical advance directive? No - Patient declined     Chief Complaint  Patient presents with  . Medical Management of Chronic Issues    discuss memory issues,MMSE score 19    HPI:  Pt is a 85 y.o. female seen today at [facility] clinic for an acute visit for evaluation of worsening memory issues.she is here with the Husband who provides additional information.He states patient's memory has worsen tends to forget things more often.she continues to do her activities of daily living by herself though her twin sister has had to correct her on marching her colors.He states patient does not like to do puzzles and other activities as she used to but prefers to play video  games and watch TV most of the time.both patient and spouse are aware of advance age and knows as time goes by her dementia will worsen.He does not think donepezil low dose is doing any good for her.she denies any fever,chills,cough or symptoms of urinary tract infections.she describes her appetite as good and sleeps well at night.Husband concerned that she does not drink enough water.she scored 19/30 on MMSE today. No behavioral issues or wandering reported.   Past Medical History:  Diagnosis Date  . Anxiety   . Benign fundic gland polyps of stomach   . Benign gastrointestinal stromal tumor (GIST)   . Bronchitis   . Cancer ([place])    recent skin cancer left leg  excised about 8 days ago-remains with dressing intact.   . Chronic cystitis   . COLONIC POLYPS, HX OF   . Complication of anesthesia   . Diverticulosis   . DYSLIPIDEMIA   . Gastritis   . GERD (gastroesophageal reflux disease)   . Hemorrhoids   . Hiatal hernia   . HYPERTENSION   . Macular degeneration    optho q3mo - [place]  . Meningioma ([place])   . MITRAL VALVE PROLAPSE   . NEPHROLITHIASIS   . OSTEOARTHRITIS   . OSTEOPOROSIS   . PONV (postoperative nausea and vomiting)   . Thoracic aortic aneurysm (HCC)    4.1 cm 2015   Past Surgical History:  Procedure Laterality Date  . ABDOMINAL HYSTERECTOMY    . APPENDECTOMY    . BREAST SURGERY    . [place]   no PCI, performed in [place]  . Cataract surgery  2000  . CHOLECYSTECTOMY    .  COLONOSCOPY    . ESOPHAGOGASTRODUODENOSCOPY    . EUS N/A 05/16/2014   Procedure: UPPER ENDOSCOPIC ULTRASOUND (EUS) LINEAR;  Surgeon: Milus Banister, MD;  Location: [facility];  Service: Endoscopy;  Laterality: N/A;  . INSERTION OF MESH  07/04/2012   Procedure: INSERTION OF MESH;  Surgeon: Odis Hollingshead, MD;  Location: [place];  Service: General;  Laterality: N/A;  . KNEE SURGERY    . LEG SKIN LESION  BIOPSY / EXCISION Left    8 days ago pending pathology, remains with  dressing.  . SURGERY OF LIP  in 2002  . TONSILLECTOMY    . VENTRAL HERNIA REPAIR  07/04/2012   Procedure: HERNIA REPAIR VENTRAL ADULT;  Surgeon: Odis Hollingshead, MD;  Location: [facility] [place];  Service: General;  Laterality: N/A;    Allergies  Allergen Reactions  . Pentothal [Thiopental] Nausea And Vomiting  . Codeine   . Levofloxacin   . Oxycodone-Acetaminophen   . Sulfonamide Derivatives     REACTION: GI upset/nausea/vomiting  . Tape Rash    Per patient adhesive tape    Outpatient Encounter Medications as of 12/06/2018  Medication Sig  . acetaminophen (TYLENOL) 500 MG tablet Take 500 mg by mouth daily as needed.   Marland Kitchen amoxicillin (AMOXIL) 500 MG tablet Take 500 mg by mouth. Prior to dental visits  . Ascorbic Acid (VITAMIN C) 500 MG tablet Take 500 mg by mouth daily.    Marland Kitchen aspirin 81 MG tablet Take 81 mg by mouth at bedtime.   . calcium-vitamin D (CALCIUM 500+D) 500-200 MG-UNIT per tablet Take 1 tablet by mouth daily.    . Cholecalciferol (VITAMIN D3) 2000 UNITS TABS Take 2,000 Units by mouth daily.    Marland Kitchen donepezil (ARICEPT) 10 MG tablet Take 1 tablet (10 mg total) by mouth daily.  Marland Kitchen esomeprazole (NEXIUM) 40 MG capsule TAKE 1 CAPSULE BY MOUTH 30-60 MIN BEFORE YOUR FIRST AND LAST MEAL OF THE DAY  . Multiple Vitamins-Minerals (PRESERVISION AREDS 2 PO) Take 1 tablet by mouth daily.  . nitrofurantoin (MACRODANTIN) 100 MG capsule Take 100 mg by mouth daily.  . simvastatin (ZOCOR) 5 MG tablet Take 1 tablet (5 mg total) by mouth at bedtime.  . vitamin E 400 UNIT capsule Take 400 Units by mouth every morning.  . [DISCONTINUED] donepezil (ARICEPT) 5 MG tablet Take 1 tablet (5 mg total) by mouth daily.  Marland Kitchen telmisartan (MICARDIS) 40 MG tablet Please specify directions, refills and quantity   No facility-administered encounter medications on file as of 12/06/2018.     Review of Systems  Constitutional: Negative for appetite change, chills, fatigue, fever and unexpected weight change.  HENT: Positive  for hearing loss. Negative for congestion, rhinorrhea, sinus pressure, sinus pain, sneezing, sore throat and trouble swallowing.        Has hearing aid but does not like to wear them.  Respiratory: Negative for cough, chest tightness, shortness of breath and wheezing.   Cardiovascular: Negative for chest pain, palpitations and leg swelling.  Gastrointestinal: Negative for abdominal distention, abdominal pain, constipation, diarrhea, nausea and vomiting.  Endocrine: Negative for cold intolerance, heat intolerance, polydipsia, polyphagia and polyuria.  Genitourinary: Negative for difficulty urinating, dysuria, flank pain, frequency and urgency.  Skin: Negative for color change, pallor and rash.  Neurological: Negative for dizziness, light-headedness and headaches.  Psychiatric/Behavioral: Negative for agitation, confusion and sleep disturbance. The patient is not nervous/anxious.     Immunization History  Administered Date(s) Administered  . Influenza Split 06/07/2011, 06/13/2012, 06/15/2013  .  Influenza Whole 06/06/2009, 05/18/2010  . Influenza, High Dose Seasonal PF 06/15/2013, 06/23/2017, 06/22/2018  . Influenza-Unspecified 06/07/2015, 05/28/2016  . Pneumococcal Conjugate-13 07/12/2014  . Pneumococcal Polysaccharide-23 09/07/2007  . Td 09/07/2007  . Tdap 06/21/2016   Pertinent  Health Maintenance Due  Topic Date Due  . INFLUENZA VACCINE  04/07/2019  . DEXA SCAN  Completed  . PNA vac Low Risk Adult  Completed   Fall Risk  12/06/2018 10/25/2018 06/22/2018 12/26/2017 06/23/2017  Falls in the past year? 0 1 Yes Yes Yes  Number falls in past yr: 0 0 1 1 1   Injury with Fall? 0 1 Yes Yes Yes  Comment - - multiple broken bones - left wrist fx  Risk Factor Category  - - - - -  Risk for fall due to : - - - - -  Follow up - - - - -    Vitals:   12/06/18 1337  BP: 118/72  Pulse: 76  Temp: (!) 96.6 F (35.9 C)  TempSrc: Oral  SpO2: 96%  Weight: 124 lb 9.6 oz (56.5 kg)  Height: 5\' 5"   (1.651 m)   Body mass index is 20.73 kg/m. Physical Exam Constitutional:      General: She is not in acute distress.    Appearance: She is normal weight. She is not ill-appearing.  HENT:     Head: Normocephalic.     Right Ear: Tympanic membrane, ear canal and external ear normal. There is no impacted cerumen.     Left Ear: Tympanic membrane, ear canal and external ear normal. There is no impacted cerumen.     Nose: Nose normal. No congestion or rhinorrhea.     Comments: HOH     Mouth/Throat:     Mouth: Mucous membranes are moist.     Pharynx: Oropharynx is clear. No oropharyngeal exudate or posterior oropharyngeal erythema.  Eyes:     General: No scleral icterus.       Right eye: No discharge.        Left eye: No discharge.     Conjunctiva/sclera: Conjunctivae normal.     Pupils: Pupils are equal, round, and reactive to light.     Comments: Corrective lens in place   Neck:     Musculoskeletal: Normal range of motion. No neck rigidity or muscular tenderness.  Cardiovascular:     Rate and Rhythm: Normal rate and regular rhythm.     Pulses: Normal pulses.     Heart sounds: Murmur present. No friction rub.  Pulmonary:     Effort: Pulmonary effort is normal. No respiratory distress.     Breath sounds: Normal breath sounds. No wheezing, rhonchi or rales.  Chest:     Chest wall: No tenderness.  Abdominal:     General: Bowel sounds are normal. There is no distension.     Palpations: Abdomen is soft. There is no mass.     Tenderness: There is no abdominal tenderness. There is no right CVA tenderness, left CVA tenderness, guarding or rebound.  Musculoskeletal:        General: No tenderness.     Right lower leg: No edema.     Left lower leg: No edema.     Comments: Gait steady with a cane.   Lymphadenopathy:     Cervical: No cervical adenopathy.  Skin:    General: Skin is warm and dry.     Coloration: Skin is not pale.     Findings: No erythema or rash.  Neurological:  Mental Status: She is alert.     Cranial Nerves: No cranial nerve deficit.     Sensory: No sensory deficit.     Motor: No weakness.     Coordination: Coordination normal.     Gait: Gait abnormal.     Comments: Scored 19/30 on MMSE   Psychiatric:        Mood and Affect: Mood normal.        Speech: Speech normal.        Behavior: Behavior normal.        Thought Content: Thought content normal.        Cognition and Memory: She exhibits impaired recent memory.        Judgment: Judgment normal.    Labs reviewed: Recent Labs    12/26/17 1345 04/18/18 0912 06/22/18 1529  NA 142 141 138  K 4.1 4.4 3.9  CL 103 101 101  CO2 27 26 29   GLUCOSE 90 101* 97  BUN 13 13 15   CREATININE 0.51 0.49* 0.57  CALCIUM 9.5 10.0 9.8   Recent Labs    12/26/17 1345 06/22/18 1529  AST 17 16  ALT 14 13  ALKPHOS 126* 80  BILITOT 0.6 0.5  PROT 6.2 6.8  ALBUMIN 4.0 4.3   Recent Labs    12/26/17 1345 06/22/18 1529  WBC 7.4 5.9  NEUTROABS 5.1 3.9  HGB 13.3 14.4  HCT 39.7 43.5  MCV 92.8 91.8  PLT 190.0 187.0   Lab Results  Component Value Date   TSH 2.20 06/22/2018   No results found for: HGBA1C Lab Results  Component Value Date   CHOL 184 06/22/2018   HDL 86.70 06/22/2018   LDLCALC 81 06/22/2018   LDLDIRECT 97.7 06/22/2013   TRIG 81.0 06/22/2018   CHOLHDL 2 06/22/2018    Significant Diagnostic Results in last 30 days:  No results found.  Assessment/Plan 1. Dementia without behavioral disturbance, unspecified dementia type (HCC) Afebrile.Progressive decline in memory.No behavioral issues reported.Scored 19/30 on MMSE today.No new behavioral issues reported. Dementia instructions guide provided this visit. - Speech Therapy to evaluate and treat as indicated for memory decline issues  - Increase Donepezil from 5 mg tablet to 10 mg tablet daily. Follow up in 4 weeks to re-evaluate effectiveness.   Family/ staff Communication: Reviewed plan of care with patient and Husband.   Labs/tests ordered: None   Follow Up: 4 weeks   Sandrea Hughs, NP

## 2018-12-06 NOTE — Patient Instructions (Addendum)
1. Increase Donepezil to 10 mg tablet one by mouth daily  2. Speech therapy evaluation order send to [place] Nurse.   Dementia Caregiver Guide Dementia is a term used to describe a number of symptoms that affect memory and thinking. The most common symptoms include:  Memory loss.  Trouble with language and communication.  Trouble concentrating.  Poor judgment.  Problems with reasoning.  Child-like behavior and language.  Extreme anxiety.  Angry outbursts.  Wandering from home or public places. Dementia usually gets worse slowly over time. In the early stages, people with dementia can stay independent and safe with some help. In later stages, they need help with daily tasks such as dressing, grooming, and using the bathroom. How to help the person with dementia cope Dementia can be frightening and confusing. Here are some tips to help the person with dementia cope with changes caused by the disease. General tips  Keep the person on track with his or her routine.  Try to identify areas where the person may need help.  Be supportive, patient, calm, and encouraging.  Gently remind the person that adjusting to changes takes time.  Help with the tasks that the person has asked for help with.  Keep the person involved in daily tasks and decisions as much as possible.  Encourage conversation, but try not to get frustrated or harried if the person struggles to find words or does not seem to appreciate your help. Communication tips  When the person is talking or seems frustrated, make eye contact and hold the person's hand.  Ask specific questions that need yes or no answers.  Use simple words, short sentences, and a calm voice. Only give one direction at a time.  When offering choices, limit them to just 1 or 2.  Avoid correcting the person in a negative way.  If the person is struggling to find the right words, gently try to help him or her. How to recognize symptoms of  stress Symptoms of stress in caregivers include:  Feeling frustrated or angry with the person with dementia.  Denying that the person has dementia or that his or her symptoms will not improve.  Feeling hopeless and unappreciated.  Difficulty sleeping.  Difficulty concentrating.  Feeling anxious, irritable, or depressed.  Developing stress-related health problems.  Feeling like you have too little time for your own life. Follow these instructions at home:   Make sure that you and the person you are caring for: ? Get regular sleep. ? Exercise regularly. ? Eat regular, nutritious meals. ? Drink enough fluid to keep your urine clear or pale yellow. ? Take over-the-counter and prescription medicines only as told by your health care providers. ? Attend all scheduled health care appointments.  Join a support group with others who are caregivers.  Ask about respite care resources so that you can have a regular break from the stress of caregiving.  Look for signs of stress in yourself and in the person you are caring for. If you notice signs of stress, take steps to manage it.  Consider any safety risks and take steps to avoid them.  Organize medications in a pill box for each day of the week.  Create a plan to handle any legal or financial matters. Get legal or financial advice if needed.  Keep a calendar in a central location to remind the person of appointments or other activities. Tips for reducing the risk of injury  Keep floors clear of clutter. Remove rugs, magazine  racks, and floor lamps.  Keep hallways well lit, especially at night.  Put a handrail and nonslip mat in the bathtub or shower.  Put childproof locks on cabinets that contain dangerous items, such as medicines, alcohol, guns, toxic cleaning items, sharp tools or utensils, matches, and lighters.  Put the locks in places where the person cannot see or reach them easily. This will help ensure that the person  does not wander out of the house and get lost.  Be prepared for emergencies. Keep a list of emergency phone numbers and addresses in a convenient area.  Remove car keys and lock garage doors so that the person does not try to get in the car and drive.  Have the person wear a bracelet that tracks locations and identifies the person as having memory problems. This should be worn at all times for safety. Where to find support: Many individuals and organizations offer support. These include:  Support groups for people with dementia and for caregivers.  Counselors or therapists.  Home health care services.  Adult day care centers. Where to find more information Alzheimer[employer]: [employer] Contact a health care provider if:  The person's health is rapidly getting worse.  You are no longer able to care for the person.  Caring for the person is affecting your physical and emotional health.  The person threatens himself or herself, you, or anyone else. Summary   Dementia is a term used to describe a number of symptoms that affect memory and thinking.  Dementia usually gets worse slowly over time.  Take steps to reduce the person's risk of injury, and to plan for future care.  Caregivers need support, relief from caregiving, and time for their own lives. This information is not intended to replace advice given to you by your health care provider. Make sure you discuss any questions you have with your health care provider. Document Released: 07/27/2016 Document Revised: 07/27/2016 Document Reviewed: 07/27/2016 Elsevier Interactive Patient Education  2019 [employer].

## 2018-12-22 ENCOUNTER — Other Ambulatory Visit: Payer: Self-pay | Admitting: Family

## 2018-12-26 ENCOUNTER — Ambulatory Visit: Payer: Medicare Other | Admitting: Internal Medicine

## 2019-01-03 ENCOUNTER — Encounter: Payer: Medicare Other | Admitting: Internal Medicine

## 2019-01-03 ENCOUNTER — Other Ambulatory Visit: Payer: Self-pay

## 2019-01-23 ENCOUNTER — Telehealth: Payer: Self-pay | Admitting: Oncology

## 2019-01-23 NOTE — Telephone Encounter (Signed)
Per [facility] reschedule list moved 5/26 f/u to 7/28. Not able to reach patient by phone or leave message at either number. Schedule mailed.

## 2019-01-30 ENCOUNTER — Ambulatory Visit: Payer: Medicare Other | Admitting: Oncology

## 2019-02-14 ENCOUNTER — Encounter: Payer: Self-pay | Admitting: Internal Medicine

## 2019-02-14 ENCOUNTER — Non-Acute Institutional Stay: Payer: Medicare Other | Admitting: Internal Medicine

## 2019-02-14 ENCOUNTER — Other Ambulatory Visit: Payer: Self-pay

## 2019-02-14 VITALS — BP 124/74 | HR 65 | Temp 97.9°F | Ht 65.0 in | Wt 115.4 lb

## 2019-02-14 DIAGNOSIS — F0391 Unspecified dementia with behavioral disturbance: Secondary | ICD-10-CM

## 2019-02-14 DIAGNOSIS — E782 Mixed hyperlipidemia: Secondary | ICD-10-CM | POA: Diagnosis not present

## 2019-02-14 DIAGNOSIS — I10 Essential (primary) hypertension: Secondary | ICD-10-CM

## 2019-02-14 DIAGNOSIS — C49A0 Gastrointestinal stromal tumor, unspecified site: Secondary | ICD-10-CM

## 2019-02-14 MED ORDER — MEMANTINE HCL 5 MG PO TABS: 5.0000 mg | ORAL_TABLET | Freq: Two times a day (BID) | ORAL | 0 refills | Status: DC

## 2019-02-14 NOTE — Progress Notes (Signed)
Location:      Place of Service:     Provider:   Code Status:  Goals of Care:  Advanced Directives 12/06/2018  Does Patient Have a Medical Advance Directive? No  Type of Advance Directive -  Does patient want to make changes to medical advance directive? No - Patient declined  Copy of [place] in Chart? -  Would patient like information on creating a medical advance directive? No - Patient declined     Chief Complaint  Patient presents with  . Acute Visit    patient c/o of  "Bad Vein" on area on right side of neck patient states it hurts with duration of 3 weeks    HPI: Patient is a 85 y.o. female seen today for medical management of chronic diseases.  Patient has h/o HTN, Recurrent UTI on Macrodantin, h/o Thoracic Aneurysm 4.3 cm, H/o GIST tumor, MVP , Aortic Stenosis mild, Dementia, h/o Osteoporosis , Also h/o Left Elbow fracture and Pelvic Fracture after a Fall in 05/19 and Cognitive Impairment  Patient here as she is worried about the veins in her Neck and she is concerned that they can burst and she can die with it. She does have h/o Dementia . She had detail MMSE done which showed her score was 19/30. Her dose of Aricept was increased to 10 mg And Speech is working with her. But she gets obsessed about something's like her Aneurysm and her Husband does not know what to do. Her husband wanted to know if we can do something for her. It seems she has also had Fall recently in the shower sustained a bruise in her Right Hand. No other injuries. Patient walks with a cane. Lives in [place] with her Husband She will keep repeating today that she is not worried about her neck but something is going to bust. Husband also has noticed Poor appetite recently    Past Medical History:  Diagnosis Date  . Anxiety   . Benign fundic gland polyps of stomach   . Benign gastrointestinal stromal tumor (GIST)   . Bronchitis   . Cancer ([place])    recent skin cancer left leg   excised about 8 days ago-remains with dressing intact.   . Chronic cystitis   . COLONIC POLYPS, HX OF   . Complication of anesthesia   . Diverticulosis   . DYSLIPIDEMIA   . Gastritis   . GERD (gastroesophageal reflux disease)   . Hemorrhoids   . Hiatal hernia   . HYPERTENSION   . Macular degeneration    optho q57mo - Hecker  . Meningioma ([place])   . MITRAL VALVE PROLAPSE   . NEPHROLITHIASIS   . OSTEOARTHRITIS   . OSTEOPOROSIS   . PONV (postoperative nausea and vomiting)   . Thoracic aortic aneurysm ([facility])    4.1 cm 2015    Past Surgical History:  Procedure Laterality Date  . ABDOMINAL HYSTERECTOMY    . APPENDECTOMY    . BREAST SURGERY    . [place]   no PCI, performed in [place]  . Cataract surgery  2000  . CHOLECYSTECTOMY    . COLONOSCOPY    . ESOPHAGOGASTRODUODENOSCOPY    . EUS N/A 05/16/2014   Procedure: UPPER ENDOSCOPIC ULTRASOUND (EUS) LINEAR;  Surgeon: Milus Banister, MD;  Location: [facility];  Service: Endoscopy;  Laterality: N/A;  . INSERTION OF MESH  07/04/2012   Procedure: INSERTION OF MESH;  Surgeon: Odis Hollingshead, MD;  Location:  [facility];  Service: General;  Laterality: N/A;  . KNEE SURGERY    . LEG SKIN LESION  BIOPSY / EXCISION Left    8 days ago pending pathology, remains with dressing.  . SURGERY OF LIP  in 2002  . TONSILLECTOMY    . VENTRAL HERNIA REPAIR  07/04/2012   Procedure: HERNIA REPAIR VENTRAL ADULT;  Surgeon: Odis Hollingshead, MD;  Location: [facility];  Service: General;  Laterality: N/A;    Allergies  Allergen Reactions  . Pentothal [Thiopental] Nausea And Vomiting  . Codeine   . Levofloxacin   . Oxycodone-Acetaminophen   . Sulfonamide Derivatives     REACTION: GI upset/nausea/vomiting  . Tape Rash    Per patient adhesive tape    Outpatient Encounter Medications as of 02/14/2019  Medication Sig  . acetaminophen (TYLENOL) 500 MG tablet Take 500 mg by mouth daily as needed.   Marland Kitchen amoxicillin (AMOXIL) 500 MG tablet Take  500 mg by mouth. Prior to dental visits  . Ascorbic Acid (VITAMIN C) 500 MG tablet Take 500 mg by mouth daily.    Marland Kitchen aspirin 81 MG tablet Take 81 mg by mouth at bedtime.   . calcium-vitamin D (CALCIUM 500+D) 500-200 MG-UNIT per tablet Take 1 tablet by mouth daily.    . Cholecalciferol (VITAMIN D3) 2000 UNITS TABS Take 2,000 Units by mouth daily.    Marland Kitchen donepezil (ARICEPT) 10 MG tablet TAKE 1 TABLET BY MOUTH EVERY DAY  . esomeprazole (NEXIUM) 40 MG capsule TAKE 1 CAPSULE BY MOUTH 30-60 MIN BEFORE YOUR FIRST AND LAST MEAL OF THE DAY  . Multiple Vitamins-Minerals (PRESERVISION AREDS 2 PO) Take 1 tablet by mouth daily.  . nitrofurantoin (MACRODANTIN) 100 MG capsule Take 100 mg by mouth daily.  . simvastatin (ZOCOR) 5 MG tablet Take 1 tablet (5 mg total) by mouth at bedtime.  Marland Kitchen telmisartan (MICARDIS) 40 MG tablet Please specify directions, refills and quantity  . vitamin E 400 UNIT capsule Take 400 Units by mouth every morning.   No facility-administered encounter medications on file as of 02/14/2019.     Review of Systems:  Review of Systems  Constitutional: Positive for appetite change.  HENT: Negative.   Respiratory: Negative.   Cardiovascular: Negative.   Gastrointestinal: Negative.   Musculoskeletal: Negative.   Skin: Negative.   Neurological: Negative.   Psychiatric/Behavioral: Positive for behavioral problems and confusion.      Health Maintenance  Topic Date Due  . INFLUENZA VACCINE  04/07/2019  . TETANUS/TDAP  06/21/2026  . DEXA SCAN  Completed  . PNA vac Low Risk Adult  Completed    Physical Exam: Vitals:   02/14/19 1536  BP: 124/74  Pulse: 65  Temp: 97.9 F (36.6 C)  TempSrc: Oral  SpO2: 95%  Weight: 115 lb 6.4 oz (52.3 kg)  Height: 5\' 5"  (1.651 m)   Body mass index is 19.2 kg/m. Physical Exam Vitals signs reviewed.  Constitutional:      Appearance: Normal appearance.  HENT:     Head: Normocephalic.     Nose: Nose normal.     Mouth/Throat:     Mouth:  Mucous membranes are moist.     Pharynx: Oropharynx is clear.  Eyes:     Pupils: Pupils are equal, round, and reactive to light.  Neck:     Musculoskeletal: Neck supple.  Cardiovascular:     Rate and Rhythm: Normal rate and regular rhythm.     Pulses: Normal pulses.     Heart sounds: Murmur  present.  Pulmonary:     Effort: Pulmonary effort is normal. No respiratory distress.     Breath sounds: Normal breath sounds. No wheezing or rales.  Abdominal:     General: Abdomen is flat. Bowel sounds are normal. There is no distension.     Palpations: Abdomen is soft.     Tenderness: There is no abdominal tenderness.  Musculoskeletal:        General: No swelling.  Skin:    General: Skin is warm and dry.  Neurological:     General: No focal deficit present.     Mental Status: She is alert.     Comments: Gait is stable with Walker. Patient does keep repeating herself   Psychiatric:        Mood and Affect: Mood normal.        Thought Content: Thought content normal.     Labs reviewed: Basic Metabolic Panel: Recent Labs    04/18/18 0912 06/22/18 1529  NA 141 138  K 4.4 3.9  CL 101 101  CO2 26 29  GLUCOSE 101* 97  BUN 13 15  CREATININE 0.49* 0.57  CALCIUM 10.0 9.8  TSH  --  2.20   Liver Function Tests: Recent Labs    06/22/18 1529  AST 16  ALT 13  ALKPHOS 80  BILITOT 0.5  PROT 6.8  ALBUMIN 4.3   No results for input(s): LIPASE, AMYLASE in the last 8760 hours. No results for input(s): AMMONIA in the last 8760 hours. CBC: Recent Labs    06/22/18 1529  WBC 5.9  NEUTROABS 3.9  HGB 14.4  HCT 43.5  MCV 91.8  PLT 187.0   Lipid Panel: Recent Labs    06/22/18 1529  CHOL 184  HDL 86.70  LDLCALC 81  TRIG 81.0  CHOLHDL 2   No results found for: HGBA1C  Procedures since last visit: No results found.  Assessment/Plan Dementia with behavioral disturbance, Most of her complains are related to her Cognitive issues with worsening due to Covid Restrictions D/W  the Husband in room She is on Aricept 10 mg Will start her on Namenda 5 mg BID Speech is already working with her. Possible Antipsychotic or Buspar if her symptoms dont get better D/W the husband about repeat ing MRI MRI in 2016 showed Mild to moderate small vessel disease type changes. Global atrophy without hydrocephalus.Does have Menigoma  Weight Loss According to our scales patient has lost 10 lbs in past few months The husband will follow her weight at home Will review in next visit Essential hypertension BP controlled on Micardis  Gastrointestinal stromal tumor (GIST) - esophagus Follows with Dr Benay Spice Just monitor for now  Thoracic Aneurysm Follows with Dr Jenkins Rouge   Age-related osteoporosis  Has been treated with Fosamax and Prolia before per notes from 2014 Will repeat DEXA scan  Recurrent UTI On Nitrofurantoin Follows with Urology Hyperlipidemia LDL 81 in 10/19 on statin   Labs/tests ordered:  @ORDERS @ Next appt:  02/26/2019   Total time spent in this patient care encounter was  40_  minutes; greater than 50% of the visit spent counseling patient and staff, reviewing records , Labs and coordinating care for problems addressed at this encounter.

## 2019-02-21 ENCOUNTER — Encounter: Payer: Self-pay | Admitting: Internal Medicine

## 2019-02-26 ENCOUNTER — Other Ambulatory Visit: Payer: Self-pay

## 2019-02-26 ENCOUNTER — Other Ambulatory Visit: Payer: Medicare Other

## 2019-02-26 DIAGNOSIS — M810 Age-related osteoporosis without current pathological fracture: Secondary | ICD-10-CM

## 2019-02-26 DIAGNOSIS — C49A0 Gastrointestinal stromal tumor, unspecified site: Secondary | ICD-10-CM

## 2019-02-26 DIAGNOSIS — F0390 Unspecified dementia without behavioral disturbance: Secondary | ICD-10-CM

## 2019-02-26 DIAGNOSIS — I10 Essential (primary) hypertension: Secondary | ICD-10-CM

## 2019-02-28 ENCOUNTER — Encounter: Payer: Self-pay | Admitting: Internal Medicine

## 2019-02-28 ENCOUNTER — Non-Acute Institutional Stay: Payer: Medicare Other | Admitting: Internal Medicine

## 2019-02-28 ENCOUNTER — Other Ambulatory Visit: Payer: Self-pay

## 2019-02-28 VITALS — BP 124/84 | HR 93 | Temp 97.9°F | Ht 65.0 in | Wt 112.0 lb

## 2019-02-28 DIAGNOSIS — E782 Mixed hyperlipidemia: Secondary | ICD-10-CM

## 2019-02-28 DIAGNOSIS — R42 Dizziness and giddiness: Secondary | ICD-10-CM | POA: Diagnosis not present

## 2019-02-28 DIAGNOSIS — I10 Essential (primary) hypertension: Secondary | ICD-10-CM | POA: Diagnosis not present

## 2019-02-28 DIAGNOSIS — F0391 Unspecified dementia with behavioral disturbance: Secondary | ICD-10-CM

## 2019-02-28 LAB — CBC WITH DIFFERENTIAL/PLATELET
Absolute Monocytes: 991 cells/uL — ABNORMAL HIGH (ref 200–950)
Basophils Absolute: 47 cells/uL (ref 0–200)
Basophils Relative: 0.6 %
Eosinophils Absolute: 273 cells/uL (ref 15–500)
Eosinophils Relative: 3.5 %
HCT: 41.7 % (ref 35.0–45.0)
Hemoglobin: 14.1 g/dL (ref 11.7–15.5)
Lymphs Abs: 1911 cells/uL (ref 850–3900)
MCH: 30.8 pg (ref 27.0–33.0)
MCHC: 33.8 g/dL (ref 32.0–36.0)
MCV: 91 fL (ref 80.0–100.0)
MPV: 10.3 fL (ref 7.5–12.5)
Monocytes Relative: 12.7 %
Neutro Abs: 4579 cells/uL (ref 1500–7800)
Neutrophils Relative %: 58.7 %
Platelets: 223 10*3/uL (ref 140–400)
RBC: 4.58 10*6/uL (ref 3.80–5.10)
RDW: 12.2 % (ref 11.0–15.0)
Total Lymphocyte: 24.5 %
WBC: 7.8 10*3/uL (ref 3.8–10.8)

## 2019-02-28 LAB — LIPID PANEL
Cholesterol: 231 mg/dL — ABNORMAL HIGH (ref <200)
HDL: 98 mg/dL (ref >=50)
LDL Cholesterol (Calc): 112 mg/dL (calc) — ABNORMAL HIGH
Non-HDL Cholesterol (Calc): 133 mg/dL (calc) — ABNORMAL HIGH (ref <130)
Total CHOL/HDL Ratio: 2.4 (calc) (ref <5.0)
Triglycerides: 99 mg/dL (ref <150)

## 2019-02-28 LAB — COMPLETE METABOLIC PANEL WITH GFR
AG Ratio: 1.9 (calc) (ref 1.0–2.5)
ALT: 8 U/L (ref 6–29)
AST: 14 U/L (ref 10–35)
Albumin: 4.3 g/dL (ref 3.6–5.1)
Alkaline phosphatase (APISO): 82 U/L (ref 37–153)
BUN: 13 mg/dL (ref 7–25)
CO2: 27 mmol/L (ref 20–32)
Calcium: 9.9 mg/dL (ref 8.6–10.4)
Chloride: 103 mmol/L (ref 98–110)
Creat: 0.61 mg/dL (ref 0.60–0.88)
GFR, Est African American: 93 mL/min/{1.73_m2} (ref >=60)
GFR, Est Non African American: 80 mL/min/{1.73_m2} (ref >=60)
Globulin: 2.3 g/dL (calc) (ref 1.9–3.7)
Glucose, Bld: 83 mg/dL (ref 65–99)
Potassium: 3.9 mmol/L (ref 3.5–5.3)
Sodium: 141 mmol/L (ref 135–146)
Total Bilirubin: 1.1 mg/dL (ref 0.2–1.2)
Total Protein: 6.6 g/dL (ref 6.1–8.1)

## 2019-02-28 LAB — VITAMIN D 1,25 DIHYDROXY
Vitamin D 1, 25 (OH)2 Total: 70 pg/mL (ref 18–72)
Vitamin D2 1, 25 (OH)2: <8 pg/mL
Vitamin D3 1, 25 (OH)2: 70 pg/mL

## 2019-02-28 LAB — TSH: TSH: 1.56 mIU/L (ref 0.40–4.50)

## 2019-02-28 NOTE — Progress Notes (Signed)
Location:     Place of Service:     Provider:   Code Status:  Goals of Care:  Advanced Directives 12/06/2018  Does Patient Have a Medical Advance Directive? No  Type of Advance Directive -  Does patient want to make changes to medical advance directive? No - Patient declined  Copy of [place] in Chart? -  Would patient like information on creating a medical advance directive? No - Patient declined     Chief Complaint  Patient presents with  . Medical Management of Chronic Issues    4 month follow up with MMSE 19/30 did not pass clock test,  patient states starting last night she is feeling off balance and is now using a cane for balance     HPI: Patient is a 85 y.o. female seen today for an routine Visit. Patient has h/o HTN, Recurrent UTI on Macrodantin, h/o Thoracic Aneurysm 4.3 cm, H/o GIST tumor, MVP , Aortic Stenosis mild, Dementia, h/o Osteoporosis , Also h/o Left Elbow fracture and Pelvic Fracture after a Fallin 05/19 and Cognitive Impairment  Patient is doing better since she was started on Namenda. Looks slightly less anxious and Doesnot repeat herself.  Was c/o Feeling Light headiness and Unsteady. Has not had any falls recently Walking with walker. No Vertigo or Dizziness. Per Husband still has Poor Appetite Is Working with Speech Therapy in [place] Husband is very supportive and helps her with her appointments and Meds   Past Medical History:  Diagnosis Date  . Anxiety   . Benign fundic gland polyps of stomach   . Benign gastrointestinal stromal tumor (GIST)   . Bronchitis   . Cancer ([place])    recent skin cancer left leg  excised about 8 days ago-remains with dressing intact.   . Chronic cystitis   . COLONIC POLYPS, HX OF   . Complication of anesthesia   . Diverticulosis   . DYSLIPIDEMIA   . Gastritis   . GERD (gastroesophageal reflux disease)   . Hemorrhoids   . Hiatal hernia   . HYPERTENSION   . Macular degeneration    optho q41mo  - Hecker  . Meningioma ([place])   . MITRAL VALVE PROLAPSE   . NEPHROLITHIASIS   . OSTEOARTHRITIS   . OSTEOPOROSIS   . PONV (postoperative nausea and vomiting)   . Thoracic aortic aneurysm ([facility])    4.1 cm 2015    Past Surgical History:  Procedure Laterality Date  . ABDOMINAL HYSTERECTOMY    . APPENDECTOMY    . BREAST SURGERY    . [place]   no PCI, performed in [place]  . Cataract surgery  2000  . CHOLECYSTECTOMY    . COLONOSCOPY    . ESOPHAGOGASTRODUODENOSCOPY    . EUS N/A 05/16/2014   Procedure: UPPER ENDOSCOPIC ULTRASOUND (EUS) LINEAR;  Surgeon: Milus Banister, MD;  [facility];  Service: Endoscopy;  Laterality: N/A;  . INSERTION OF MESH  07/04/2012   Procedure: INSERTION OF MESH;  Surgeon: Odis Hollingshead, MD;  Location: [facility];  Service: General;  Laterality: N/A;  . KNEE SURGERY    . LEG SKIN LESION  BIOPSY / EXCISION Left    8 days ago pending pathology, remains with dressing.  . SURGERY OF LIP  in 2002  . TONSILLECTOMY    . VENTRAL HERNIA REPAIR  07/04/2012   Procedure: HERNIA REPAIR VENTRAL ADULT;  Surgeon: Odis Hollingshead, MD;  Location: [facility];  Service: General;  Laterality: N/A;    Allergies  Allergen Reactions  . Pentothal [Thiopental] Nausea And Vomiting  . Codeine   . Levofloxacin   . Oxycodone-Acetaminophen   . Sulfonamide Derivatives     REACTION: GI upset/nausea/vomiting  . Tape Rash    Per patient adhesive tape    Outpatient Encounter Medications as of 02/28/2019  Medication Sig  . acetaminophen (TYLENOL) 500 MG tablet Take 500 mg by mouth daily as needed.   . Ascorbic Acid (VITAMIN C) 500 MG tablet Take 500 mg by mouth daily.    Marland Kitchen aspirin 81 MG tablet Take 81 mg by mouth at bedtime.   . calcium-vitamin D (CALCIUM 500+D) 500-200 MG-UNIT per tablet Take 1 tablet by mouth daily.    . Cholecalciferol (VITAMIN D3) 2000 UNITS TABS Take 2,000 Units by mouth daily.    Marland Kitchen donepezil (ARICEPT) 10 MG tablet TAKE 1 TABLET BY  MOUTH EVERY DAY  . esomeprazole (NEXIUM) 40 MG capsule TAKE 1 CAPSULE BY MOUTH 30-60 MIN BEFORE YOUR FIRST AND LAST MEAL OF THE DAY  . memantine (NAMENDA) 5 MG tablet Take 1 tablet (5 mg total) by mouth 2 (two) times daily for 30 days.  . Multiple Vitamins-Minerals (PRESERVISION AREDS 2 PO) Take 1 tablet by mouth daily.  . nitrofurantoin (MACRODANTIN) 100 MG capsule Take 100 mg by mouth daily.  . simvastatin (ZOCOR) 5 MG tablet Take 1 tablet (5 mg total) by mouth at bedtime.  . vitamin E 400 UNIT capsule Take 400 Units by mouth every morning.  Marland Kitchen telmisartan (MICARDIS) 40 MG tablet Please specify directions, refills and quantity (Patient taking differently: 40 mg daily. )  . [DISCONTINUED] amoxicillin (AMOXIL) 500 MG tablet Take 500 mg by mouth. Prior to dental visits   No facility-administered encounter medications on file as of 02/28/2019.     Review of Systems:  Review of Systems  Constitutional: Positive for appetite change and unexpected weight change.  HENT: Negative.   Respiratory: Negative.   Cardiovascular: Negative.   Gastrointestinal: Negative.   Genitourinary: Negative.   Musculoskeletal: Positive for gait problem.  Skin: Negative.   Neurological: Positive for light-headedness.  Psychiatric/Behavioral: Negative.     Health Maintenance  Topic Date Due  . INFLUENZA VACCINE  04/07/2019  . TETANUS/TDAP  06/21/2026  . DEXA SCAN  Completed  . PNA vac Low Risk Adult  Completed    Physical Exam: Vitals:   02/28/19 1420  BP: 124/84  Pulse: 93  Temp: 97.9 F (36.6 C)  TempSrc: Oral  SpO2: 95%  Weight: 112 lb (50.8 kg)  Height: 5\' 5"  (1.651 m)   Body mass index is 18.64 kg/m. Physical Exam Vitals signs reviewed.  HENT:     Head: Normocephalic.     Nose: Nose normal.     Mouth/Throat:     Mouth: Mucous membranes are moist.     Pharynx: Oropharynx is clear.  Eyes:     Pupils: Pupils are equal, round, and reactive to light.  Neck:     Musculoskeletal: Neck  supple.  Cardiovascular:     Rate and Rhythm: Normal rate and regular rhythm.     Pulses: Normal pulses.     Heart sounds: Normal heart sounds.  Pulmonary:     Effort: Pulmonary effort is normal. No respiratory distress.     Breath sounds: Normal breath sounds. No wheezing or rales.  Abdominal:     General: Abdomen is flat. Bowel sounds are normal.     Palpations: Abdomen is soft.  Musculoskeletal:  General: No swelling.  Skin:    General: Skin is warm and dry.  Neurological:     General: No focal deficit present.     Mental Status: She is alert.  Psychiatric:        Mood and Affect: Mood normal.        Thought Content: Thought content normal.        Judgment: Judgment normal.     Labs reviewed: Basic Metabolic Panel: Recent Labs    04/18/18 0912 06/22/18 1529 02/26/19 0805  NA 141 138 141  K 4.4 3.9 3.9  CL 101 101 103  CO2 26 29 27   GLUCOSE 101* 97 83  BUN 13 15 13   CREATININE 0.49* 0.57 0.61  CALCIUM 10.0 9.8 9.9  TSH  --  2.20 1.56   Liver Function Tests: Recent Labs    06/22/18 1529 02/26/19 0805  AST 16 14  ALT 13 8  ALKPHOS 80  --   BILITOT 0.5 1.1  PROT 6.8 6.6  ALBUMIN 4.3  --    No results for input(s): LIPASE, AMYLASE in the last 8760 hours. No results for input(s): AMMONIA in the last 8760 hours. CBC: Recent Labs    06/22/18 1529 02/26/19 0805  WBC 5.9 7.8  NEUTROABS 3.9 4,579  HGB 14.4 14.1  HCT 43.5 41.7  MCV 91.8 91.0  PLT 187.0 223   Lipid Panel: Recent Labs    06/22/18 1529 02/26/19 0805  CHOL 184 231*  HDL 86.70 98  LDLCALC 81 112*  TRIG 81.0 99  CHOLHDL 2 2.4   No results found for: HGBA1C  Procedures since last visit: No results found.  Assessment/Plan Light Headiness She was not orthostatic in the clinic Gait was stable with Cane Have d/w the Husband if any unsteady gait will get Therapy eval Labs were Normal Dementia with behavioral disturbance, Her Repeat MMSE today was 19/30 which is stable from 6  months ago Failed her Clock Drawing Will continue Namenda 5 mg BID for now Consider increasing in Next visit Have discontinued Aricept Is working With Speech therapy MRI in 2016 showedMild to moderate small vessel disease type changes. Global atrophy without hydrocephalus.Does have Menigoma  Weight Loss According to our scales patient has lost 10 lbs in past few months Per husband appetite is poor Will discontinue Aricept for now Add Boost  Labs looked good Follow up in 3 months Essential hypertension BP controlled on Micardis  Gastrointestinal stromal tumor (GIST) - esophagus Follows with Dr Benay Spice Just monitor for now  Thoracic Aneurysm Follows with Dr Jenkins Rouge   Age-related osteoporosis Has been treated with Fosamax and Prolia before per notes from 2014 Will repeat DEXA scan after Restrictions due to Covid are changed  Recurrent UTI On Nitrofurantoin and Keflex  Follows with Urology No h/o Recent UTI Hyperlipidemia Her LDL was high  Her husband said she was not taking her statin He is managing it now and will make sure she takes it   Labs/tests ordered:  Next appt:  04/10/2019  Total time spent in this patient care encounter was  40_  minutes; greater than 50% of the visit spent counseling patient and staff, reviewing records , Labs and coordinating care for problems addressed at this encounter.

## 2019-03-09 ENCOUNTER — Other Ambulatory Visit: Payer: Self-pay | Admitting: Internal Medicine

## 2019-04-03 ENCOUNTER — Inpatient Hospital Stay: Payer: Medicare Other | Attending: Oncology | Admitting: Oncology

## 2019-04-03 ENCOUNTER — Other Ambulatory Visit: Payer: Self-pay

## 2019-04-03 VITALS — BP 157/75 | HR 70 | Temp 98.7°F | Resp 15 | Ht 65.0 in | Wt 113.0 lb

## 2019-04-03 DIAGNOSIS — R1319 Other dysphagia: Secondary | ICD-10-CM | POA: Diagnosis not present

## 2019-04-03 DIAGNOSIS — C49A0 Gastrointestinal stromal tumor, unspecified site: Secondary | ICD-10-CM | POA: Diagnosis present

## 2019-04-03 DIAGNOSIS — N390 Urinary tract infection, site not specified: Secondary | ICD-10-CM | POA: Diagnosis not present

## 2019-04-03 DIAGNOSIS — F0390 Unspecified dementia without behavioral disturbance: Secondary | ICD-10-CM | POA: Diagnosis not present

## 2019-04-03 DIAGNOSIS — Z79899 Other long term (current) drug therapy: Secondary | ICD-10-CM | POA: Diagnosis not present

## 2019-04-03 NOTE — Progress Notes (Signed)
  [facility] OFFICE PROGRESS NOTE   Diagnosis: Gastrointestinal stromal tumor  INTERVAL HISTORY:   Ms. Chelsea Jordan returns for scheduled visit.  She feels well.  She reports a good appetite.  No dysphasia.  No nausea.  No pain.  She can palpate the pulse in her right neck.  Objective:  Vital signs in last 24 hours:  Blood pressure (!) 157/75, pulse 70, temperature 98.7 F (37.1 C), temperature source Temporal, resp. rate 15, height 5\' 5"  (1.651 m), weight 113 lb (51.3 kg), SpO2 94 %.    HEENT: Prominent right carotid pulse, no bruit Lymphatics: No cervical, supraclavicular, or axillary nodes Resp: Lungs clear bilaterally Cardio: Regular rate and rhythm GI: No hepatosplenomegaly, no mass,, firm tissue at the medial aspect of the right upper quadrant scar Vascular: No leg edema Neuro: Alert, follows commands   Lab Results:  Lab Results  Component Value Date   WBC 7.8 02/26/2019   HGB 14.1 02/26/2019   HCT 41.7 02/26/2019   MCV 91.0 02/26/2019   PLT 223 02/26/2019   NEUTROABS 4,579 02/26/2019    CMP  Lab Results  Component Value Date   NA 141 02/26/2019   K 3.9 02/26/2019   CL 103 02/26/2019   CO2 27 02/26/2019   GLUCOSE 83 02/26/2019   BUN 13 02/26/2019   CREATININE 0.61 02/26/2019   CALCIUM 9.9 02/26/2019   PROT 6.6 02/26/2019   ALBUMIN 4.3 06/22/2018   AST 14 02/26/2019   ALT 8 02/26/2019   ALKPHOS 80 06/22/2018   BILITOT 1.1 02/26/2019   GFRNONAA 80 02/26/2019   GFRAA 93 02/26/2019    Medications: I have reviewed the patient's current medications.   Assessment/Plan: 1. 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 2.6 cm muscularis propria mass at 30 cm from the incisors, 9 mm similar appearing lesion at the GE junction ? CT chest 05/16/2017-no change in the distal esophagus mass or lesion at the GE junction  2. History of pill dysphagia secondary to #1  3. Chronic urinary tract  infections  4. Squamous cell carcinoma of the left lower leg August 2015     Disposition: Chelsea Jordan appears asymptomatic from a gastrointestinal stromal tumor.  I recommended she contact us for dysphasia or new symptoms. She appears to have developed significant dementia.  She had difficulty with memory recall today. She has also significant amount of weight over the past year.  I suspect this is related to dementia as opposed to progression of the gastrointestinal stromal tumor.  Ms. Chelsea Jordan will return for an office visit in 1 year.  I am available to see her sooner as needed.  Betsy Coder, MD  04/03/2019  11:55 AM

## 2019-04-04 ENCOUNTER — Telehealth: Payer: Self-pay | Admitting: Oncology

## 2019-04-04 ENCOUNTER — Telehealth: Payer: Self-pay | Admitting: *Deleted

## 2019-04-04 MED ORDER — MEMANTINE HCL 5 MG PO TABS: 5.0000 mg | ORAL_TABLET | Freq: Two times a day (BID) | ORAL | 3 refills | Status: DC

## 2019-04-04 NOTE — Telephone Encounter (Signed)
Mast, Man X, NP  You 11 minutes ago (10:49 AM)   Its okay to continue Memantine, thanks.    Message text        Rx faxed to pharmacy. Patient aware.

## 2019-04-04 NOTE — Telephone Encounter (Signed)
Patient husband is calling for a refill on patient's Memantine. But the instructions on the medication states to take for 30 days. Is it ok for patient to continue this medication and send refill. Please Advise.

## 2019-04-04 NOTE — Telephone Encounter (Signed)
Scheduled per los. Mailed printout  °

## 2019-04-09 ENCOUNTER — Telehealth: Payer: Self-pay

## 2019-04-09 NOTE — Telephone Encounter (Signed)
I called patient to clarify scheduled appointment for prolia injection. I spoke with patient and she had me speak with her husband. I asked [email or web address] if Dr.Gupta instructed patient to restart Prolia for I was unable to find documentation.   Per [id number].[email or web address] patient had not been instructed to restart prolia and they were not aware of a pending appointment tomorrow. Mr.Schultes states patient needs to get bone density first before prolia is reconsidered. Patient will schedule bone density when pandemic is over.  I canceled appointment for tomorrow.

## 2019-04-10 ENCOUNTER — Ambulatory Visit: Payer: Medicare Other

## 2019-04-26 ENCOUNTER — Other Ambulatory Visit: Payer: Self-pay | Admitting: Nurse Practitioner

## 2019-05-28 NOTE — Progress Notes (Signed)
HPI The patient returns for one year followup.  She has a 4.1 cm thoracic aneurysm on previous CT.   It was 4.38 on the recent echo. This was 4.3 in 2019.  At a prevoius appt she fell and broke her pelvis in our parking lot.  She comes in today.  She has obvious memory problems.  However, she denies any new cardiovascular complaints.  I spoke with her husband and he says aside from the chronic complaints she has of the vein in her neck "popping up" she has no other complaints.  Today she is not mentioning this.  She denies any new shortness of breath, PND orthopnea.  She is not describing palpitations, presyncope or syncope.  She gets around with a cane.   Allergies  Allergen Reactions  . Pentothal [Thiopental] Nausea And Vomiting  . Codeine   . Levofloxacin   . Oxycodone-Acetaminophen   . Sulfonamide Derivatives     REACTION: GI upset/nausea/vomiting  . Tape Rash    Per patient adhesive tape    Current Outpatient Medications  Medication Sig Dispense Refill  . acetaminophen (TYLENOL) 500 MG tablet Take 500 mg by mouth daily as needed.     . Ascorbic Acid (VITAMIN C) 500 MG tablet Take 500 mg by mouth daily.      Marland Kitchen aspirin 81 MG tablet Take 81 mg by mouth at bedtime.     . calcium-vitamin D (CALCIUM 500+D) 500-200 MG-UNIT per tablet Take 1 tablet by mouth daily.      . Cholecalciferol (VITAMIN D3) 2000 UNITS TABS Take 2,000 Units by mouth daily.      Marland Kitchen esomeprazole (NEXIUM) 40 MG capsule TAKE 1 CAPSULE BY MOUTH 30-60 MIN BEFORE YOUR FIRST AND LAST MEAL OF THE DAY 90 capsule 3  . memantine (NAMENDA) 5 MG tablet TAKE 1 TABLET BY MOUTH TWICE A DAY 180 tablet 1  . Multiple Vitamins-Minerals (PRESERVISION AREDS 2 PO) Take 1 tablet by mouth daily.    . nitrofurantoin (MACRODANTIN) 100 MG capsule Take 100 mg by mouth daily.    . simvastatin (ZOCOR) 5 MG tablet Take 1 tablet (5 mg total) by mouth at bedtime. 90 tablet 3  . telmisartan (MICARDIS) 40 MG tablet Please specify directions,  refills and quantity (Patient taking differently: 40 mg daily. ) 90 tablet 1  . vitamin E 400 UNIT capsule Take 400 Units by mouth every morning.     No current facility-administered medications for this visit.     Past Medical History:  Diagnosis Date  . Anxiety   . Benign fundic gland polyps of stomach   . Benign gastrointestinal stromal tumor (GIST)   . Bronchitis   . Cancer ([place])    recent skin cancer left leg  excised about 8 days ago-remains with dressing intact.   . Chronic cystitis   . COLONIC POLYPS, HX OF   . Complication of anesthesia   . Diverticulosis   . DYSLIPIDEMIA   . Gastritis   . GERD (gastroesophageal reflux disease)   . Hemorrhoids   . Hiatal hernia   . HYPERTENSION   . Macular degeneration    optho q71mo - Hecker  . Meningioma ([place])   . MITRAL VALVE PROLAPSE   . NEPHROLITHIASIS   . OSTEOARTHRITIS   . OSTEOPOROSIS   . PONV (postoperative nausea and vomiting)   . Thoracic aortic aneurysm (HCC)    4.1 cm 2015    Past Surgical History:  Procedure Laterality Date  . ABDOMINAL HYSTERECTOMY    .  APPENDECTOMY    . BREAST SURGERY    . [place]   no PCI, performed in [place]  . Cataract surgery  2000  . CHOLECYSTECTOMY    . COLONOSCOPY    . ESOPHAGOGASTRODUODENOSCOPY    . EUS N/A 05/16/2014   Procedure: UPPER ENDOSCOPIC ULTRASOUND (EUS) LINEAR;  Surgeon: Milus Banister, MD;  Location: [facility];  Service: Endoscopy;  Laterality: N/A;  . INSERTION OF MESH  07/04/2012   Procedure: INSERTION OF MESH;  Surgeon: Odis Hollingshead, MD;  Location: [facility];  Service: General;  Laterality: N/A;  . KNEE SURGERY    . LEG SKIN LESION  BIOPSY / EXCISION Left    8 days ago pending pathology, remains with dressing.  . SURGERY OF LIP  in 2002  . TONSILLECTOMY    . VENTRAL HERNIA REPAIR  07/04/2012   Procedure: HERNIA REPAIR VENTRAL ADULT;  Surgeon: Odis Hollingshead, MD;  Location: [facility];  Service: General;  Laterality: N/A;    ROS:   As  stated in the HPI and negative for all other systems.  PHYSICAL EXAM BP 124/64   Pulse 72   Ht 5\' 5"  (1.651 m)   Wt 115 lb (52.2 kg)   BMI 19.14 kg/m   GENERAL:  Well appearing NECK:  No jugular venous distention, waveform within normal limits, carotid upstroke brisk and symmetric, no bruits, no thyromegaly LUNGS:  Clear to auscultation bilaterally CHEST:  Unremarkable HEART:  PMI not displaced or sustained,S1 and S2 within normal limits, no S3, no S4, no clicks, no rubs, no  murmurs ABD:  Flat, positive bowel sounds normal in frequency in pitch, no bruits, no rebound, no guarding, no midline pulsatile mass, no hepatomegaly, no splenomegaly EXT:  2 plus pulses throughout, no edema, no cyanosis no clubbing   EKG:    Sinus rhythm, rate 72, first-degree AV block, axis within normal limits, premature ventricular contractions, no acute ST-T wave changes.   ASSESSMENT AND PLAN   AS:   This has been mild.  I spoke with her husband.  At this point he would want to pursue conservative therapy and I think this is extremely reasonable.  No change in therapy.  MVP:     This was mild.  No change in therapy or further imaging is indicated.  HTN:   Blood pressure is at target.  She will continue with meds as listed.   THORACIC ANEURYSM:   This was stable on CT of 4.3 cm.  I do not think she would ever really be a candidate for surgical intervention on this.  I spoke with her husband about this and he agrees.  No further imaging is indicated.   BRUIT:  She had mild bilateral plaque.  No change in therapy.  No further imaging.

## 2019-05-29 ENCOUNTER — Other Ambulatory Visit: Payer: Self-pay

## 2019-05-29 ENCOUNTER — Encounter: Payer: Self-pay | Admitting: Cardiology

## 2019-05-29 ENCOUNTER — Other Ambulatory Visit: Payer: Self-pay | Admitting: Internal Medicine

## 2019-05-29 ENCOUNTER — Ambulatory Visit (INDEPENDENT_AMBULATORY_CARE_PROVIDER_SITE_OTHER): Payer: Medicare Other | Admitting: Cardiology

## 2019-05-29 VITALS — BP 124/64 | HR 72 | Ht 65.0 in | Wt 115.0 lb

## 2019-05-29 DIAGNOSIS — I350 Nonrheumatic aortic (valve) stenosis: Secondary | ICD-10-CM

## 2019-05-29 DIAGNOSIS — I712 Thoracic aortic aneurysm, without rupture, unspecified: Secondary | ICD-10-CM

## 2019-05-29 DIAGNOSIS — I10 Essential (primary) hypertension: Secondary | ICD-10-CM | POA: Diagnosis not present

## 2019-05-29 NOTE — Patient Instructions (Signed)
Medication Instructions:  Your physician recommends that you continue on your current medications as directed. Please refer to the Current Medication list given to you today.  If you need a refill on your cardiac medications before your next appointment, please call your pharmacy.   Lab work: NONE  Testing/Procedures: NONE  Follow-Up: Return as needed.

## 2019-05-30 ENCOUNTER — Non-Acute Institutional Stay: Payer: Medicare Other | Admitting: Internal Medicine

## 2019-05-30 ENCOUNTER — Encounter: Payer: Self-pay | Admitting: Internal Medicine

## 2019-05-30 VITALS — BP 120/62 | HR 76 | Temp 97.1°F | Resp 18 | Ht 65.0 in | Wt 115.0 lb

## 2019-05-30 DIAGNOSIS — R634 Abnormal weight loss: Secondary | ICD-10-CM | POA: Diagnosis not present

## 2019-05-30 DIAGNOSIS — F0390 Unspecified dementia without behavioral disturbance: Secondary | ICD-10-CM

## 2019-05-30 DIAGNOSIS — I10 Essential (primary) hypertension: Secondary | ICD-10-CM

## 2019-05-30 DIAGNOSIS — E782 Mixed hyperlipidemia: Secondary | ICD-10-CM | POA: Diagnosis not present

## 2019-05-30 DIAGNOSIS — I712 Thoracic aortic aneurysm, without rupture, unspecified: Secondary | ICD-10-CM

## 2019-05-30 DIAGNOSIS — C49A0 Gastrointestinal stromal tumor, unspecified site: Secondary | ICD-10-CM

## 2019-05-30 DIAGNOSIS — M810 Age-related osteoporosis without current pathological fracture: Secondary | ICD-10-CM

## 2019-05-30 DIAGNOSIS — R2681 Unsteadiness on feet: Secondary | ICD-10-CM

## 2019-05-30 MED ORDER — MEMANTINE HCL 10 MG PO TABS: 10.0000 mg | ORAL_TABLET | Freq: Two times a day (BID) | ORAL | 2 refills | Status: DC

## 2019-05-30 NOTE — Progress Notes (Signed)
Location: [place] of Service:  Clinic (12)  Provider:   Code Status: Goals of Care:  Advanced Directives 12/06/2018  Does Patient Have a Medical Advance Directive? No  Type of Advance Directive -  Does patient want to make changes to medical advance directive? No - Patient declined  Copy of [place] in Chart? -  Would patient like information on creating a medical advance directive? No - Patient declined     Chief Complaint  Patient presents with  . Medical Management of Chronic Issues    3 mo f/u    HPI: Patient is a 85 y.o. female seen today for an acute visit for 3 month Follow up  Recurrent UTI Now only on Keflex.  Nitrofurantoin discontinued History of thoracic aneurysm 4.3 cm Follow-up with cardiology recommended conservative treatment History of GIST tumor Follows with oncology.  Recommended conservative treatment Moderate dementia with Weight Loss Continues to have issues with her memory.  Staying independent with her ADLs Husband is taking care of her medicines now.  He states that he cannot leave her alone as she gets confused Has worked with speech before Has gained some weight Husband doing Boost supplements. Aricept was discontinued Last visit Unsteady Gait Doing well with a cane now No vertigo or dizziness has not had any fall Past Medical History:  Diagnosis Date  . Anxiety   . Benign fundic gland polyps of stomach   . Benign gastrointestinal stromal tumor (GIST)   . Bronchitis   . Cancer ([place])    recent skin cancer left leg  excised about 8 days ago-remains with dressing intact.   . Chronic cystitis   . COLONIC POLYPS, HX OF   . Complication of anesthesia   . Diverticulosis   . DYSLIPIDEMIA   . Gastritis   . GERD (gastroesophageal reflux disease)   . Hemorrhoids   . Hiatal hernia   . HYPERTENSION   . Macular degeneration    optho q26mo - [facility]cker  . Meningioma ([place])   . MITRAL VALVE PROLAPSE   .  NEPHROLITHIASIS   . OSTEOARTHRITIS   . OSTEOPOROSIS   . PONV (postoperative nausea and vomiting)   . Thoracic aortic aneurysm (HCC)    4.1 cm 2015    Past Surgical History:  Procedure Laterality Date  . ABDOMINAL HYSTERECTOMY    . APPENDECTOMY    . BREAST SURGERY    . [place]   no PCI, performed in [place]  . Cataract surgery  2000  . CHOLECYSTECTOMY    . COLONOSCOPY    . ESOPHAGOGASTRODUODENOSCOPY    . EUS N/A 05/16/2014   Procedure: UPPER ENDOSCOPIC ULTRASOUND (EUS) LINEAR;  Surgeon: Milus Banister, MD;  [facility];  Service: Endoscopy;  Laterality: N/A;  . INSERTION OF MESH  07/04/2012   Procedure: INSERTION OF MESH;  Surgeon: Odis Hollingshead, MD;  Location: [facility];  Service: General;  Laterality: N/A;  . KNEE SURGERY    . LEG SKIN LESION  BIOPSY / EXCISION Left    8 days ago pending pathology, remains with dressing.  . SURGERY OF LIP  in 2002  . TONSILLECTOMY    . VENTRAL HERNIA REPAIR  07/04/2012   Procedure: HERNIA REPAIR VENTRAL ADULT;  Surgeon: Odis Hollingshead, MD;  Location: [facility];  Service: General;  Laterality: N/A;    Allergies  Allergen Reactions  . Pentothal [Thiopental] Nausea And Vomiting  . Codeine   . Levofloxacin   .  Oxycodone-Acetaminophen   . Sulfonamide Derivatives     REACTION: GI upset/nausea/vomiting  . Tape Rash    Per patient adhesive tape    Outpatient Encounter Medications as of 05/30/2019  Medication Sig  . acetaminophen (TYLENOL) 500 MG tablet Take 500 mg by mouth daily as needed.   . Ascorbic Acid (VITAMIN C) 500 MG tablet Take 500 mg by mouth daily.    Marland Kitchen aspirin 81 MG tablet Take 81 mg by mouth at bedtime.   . calcium-vitamin D (CALCIUM 500+D) 500-200 MG-UNIT per tablet Take 1 tablet by mouth daily.    . Cholecalciferol (VITAMIN D3) 2000 UNITS TABS Take 2,000 Units by mouth daily.    Marland Kitchen esomeprazole (NEXIUM) 40 MG capsule TAKE 1 CAPSULE BY MOUTH 30-60 MIN BEFORE YOUR FIRST AND LAST MEAL OF THE DAY  .  memantine (NAMENDA) 5 MG tablet TAKE 1 TABLET BY MOUTH TWICE A DAY  . Multiple Vitamins-Minerals (PRESERVISION AREDS 2 PO) Take 1 tablet by mouth daily.  . simvastatin (ZOCOR) 5 MG tablet Take 1 tablet (5 mg total) by mouth at bedtime.  Marland Kitchen telmisartan (MICARDIS) 40 MG tablet Please specify directions, refills and quantity (Patient taking differently: 40 mg daily. )  . vitamin E 400 UNIT capsule Take 400 Units by mouth every morning.  . [DISCONTINUED] nitrofurantoin (MACRODANTIN) 100 MG capsule Take 100 mg by mouth daily.   No facility-administered encounter medications on file as of 05/30/2019.     Review of Systems:  Review of Systems  Review of Systems  Constitutional: Negative for activity change, appetite change, chills, diaphoresis, fatigue and fever.  HENT: Negative for mouth sores, postnasal drip, rhinorrhea, sinus pain and sore throat.   Respiratory: Negative for apnea, cough, chest tightness, shortness of breath and wheezing.   Cardiovascular: Negative for chest pain, palpitations and leg swelling.  Gastrointestinal: Negative for abdominal distention, abdominal pain, constipation, diarrhea, nausea and vomiting.  Genitourinary: Negative for dysuria and frequency.  Musculoskeletal: Negative for arthralgias, joint swelling and myalgias.  Skin: Negative for rash.  Neurological: Negative for dizziness, syncope, weakness, light-headedness and numbness.  Psychiatric/Behavioral: Negative for behavioral problems, confusion and sleep disturbance.     Health Maintenance  Topic Date Due  . INFLUENZA VACCINE  04/07/2019  . TETANUS/TDAP  06/21/2026  . DEXA SCAN  Completed  . PNA vac Low Risk Adult  Completed    Physical Exam: Vitals:   05/30/19 1325  BP: 120/62  Pulse: 76  Resp: 18  Temp: (!) 97.1 F (36.2 C)  SpO2: 96%  Weight: 115 lb (52.2 kg)  Height: 5\' 5"  (1.651 m)   Body mass index is 19.14 kg/m. Physical Exam  Constitutional:  Well-developed and well-nourished.   HENT:  Head: Normocephalic.  Mouth/Throat: Oropharynx is clear and moist.  Eyes: Pupils are equal, round, and reactive to light.  Neck: Neck supple.  Cardiovascular: Normal rate and normal heart sounds.  No murmur heard. Pulmonary/Chest: Effort normal and breath sounds normal. No respiratory distress. No wheezes. She has no rales.  Abdominal: Soft. Bowel sounds are normal. No distension. There is no tenderness. There is no rebound.  Musculoskeletal: No edema.  Lymphadenopathy: none Neurological: Skin: Skin is warm and dry.  Psychiatric: Normal mood and affect. Behavior is normal. Thought content normal.    Labs reviewed: Basic Metabolic Panel: Recent Labs    06/22/18 1529 02/26/19 0805  NA 138 141  K 3.9 3.9  CL 101 103  CO2 29 27  GLUCOSE 97 83  BUN 15 13  CREATININE 0.57 0.61  CALCIUM 9.8 9.9  TSH 2.20 1.56   Liver Function Tests: Recent Labs    06/22/18 1529 02/26/19 0805  AST 16 14  ALT 13 8  ALKPHOS 80  --   BILITOT 0.5 1.1  PROT 6.8 6.6  ALBUMIN 4.3  --    No results for input(s): LIPASE, AMYLASE in the last 8760 hours. No results for input(s): AMMONIA in the last 8760 hours. CBC: Recent Labs    06/22/18 1529 02/26/19 0805  WBC 5.9 7.8  NEUTROABS 3.9 4,579  HGB 14.4 14.1  HCT 43.5 41.7  MCV 91.8 91.0  PLT 187.0 223   Lipid Panel: Recent Labs    06/22/18 1529 02/26/19 0805  CHOL 184 231*  HDL 86.70 98  LDLCALC 81 112*  TRIG 81.0 99  CHOLHDL 2 2.4   No results found for: HGBA1C  Procedures since last visit: No results found.  Assessment/Plan  Weight loss Has stabilized Husband doing Boost supplements Aricept Discontinued  Essential hypertension Controlled on Micardis  Mixed hyperlipidemia On statin LDL was high as she was missing doses before Husband is taking care of her Meds now  Gastrointestinal stromal tumor (GIST) - esophagus Follows  with Dr Benay Spice Conservative management  Dementia without behavioral disturbance,   Doing well with help of her husband Increase her Namenda to 10 mg BID Patient cannot drive now Reinforced to Husband Still independent in her ADLS Repeat MMSE today was 19/30 which is stable from 6 months ago Failed her Clock Drawing MRI in 2016 showedMild to moderate small vessel disease type changes. Global atrophy without hydrocephalus.Does have Price  Thoracic aortic aneurysm without rupture ([place]) Follows with Cardiology No Intervention for now  Age-related osteoporosis without current pathological fracture Has been treated with Fosamax and Prolia before per notes from 2014 Will repeat DEXA scan after Restrictions due to Covid are changed Unsteady gait Using Cane now No Falls     Labs/tests ordered:  * No order type specified * Next appt:  Visit date not found

## 2019-06-20 ENCOUNTER — Non-Acute Institutional Stay: Payer: Medicare Other | Admitting: Internal Medicine

## 2019-06-20 ENCOUNTER — Other Ambulatory Visit: Payer: Self-pay

## 2019-06-20 ENCOUNTER — Encounter: Payer: Self-pay | Admitting: Internal Medicine

## 2019-06-20 VITALS — BP 134/70 | HR 68 | Temp 98.1°F | Ht 65.0 in | Wt 117.0 lb

## 2019-06-20 DIAGNOSIS — R634 Abnormal weight loss: Secondary | ICD-10-CM | POA: Diagnosis not present

## 2019-06-20 DIAGNOSIS — I712 Thoracic aortic aneurysm, without rupture, unspecified: Secondary | ICD-10-CM

## 2019-06-20 DIAGNOSIS — R42 Dizziness and giddiness: Secondary | ICD-10-CM

## 2019-06-20 NOTE — Progress Notes (Signed)
Location: [place] of Service:  Clinic (12)  Provider:   Code Status:  Goals of Care:  Advanced Directives 12/06/2018  Does Patient Have a Medical Advance Directive? No  Type of Advance Directive -  Does patient want to make changes to medical advance directive? No - Patient declined  Copy of [place] in Chart? -  Would patient like information on creating a medical advance directive? No - Patient declined     Chief Complaint  Patient presents with  . Acute Visit    R carotid artery sometimes hurts and sometimes it doesn't. She is not able to confirm her medication list but says if it is here, she is taking it. She did not bring a list.    HPI: Patient is a 85 y.o. female seen today for an acute visit for Neck Pain and follow up of Weight Loss Patient has a history of recurrent UTI, thoracic aneurysm 4.3 cm, history of gist tumor, moderate dementia with weight loss and unsteady gait.  Due to the history of thoracic aneurysm and neck bruit patient thinks that she would die if she did not follow-up with the physician. She has seen Dr. Percival Spanish and he had explained it to her and her husband.  Due to her age and frailty she is not a candidate for any intervention. But She forgets. And Starts having anxiety Her husband brought her here because he has been unable to help her  Weight loss Patient was taken off and her weight has stabilized and she actually has gained some weight. Her husband is making sure she is eating 3 meals a day Lightheadedness This is new complaint per patient's husband she sometimes stays her head feels lightheaded especially in the morning.  Patient told me that she thinks she has cancer which has spread to her head  Patient did not have any acute complaints she kept repeating that she has something in her neck which can kill her   Past Medical History:  Diagnosis Date  . Anxiety   . Benign fundic gland polyps of  stomach   . Benign gastrointestinal stromal tumor (GIST)   . Bronchitis   . Cancer ([place])    recent skin cancer left leg  excised about 8 days ago-remains with dressing intact.   . Chronic cystitis   . COLONIC POLYPS, HX OF   . Complication of anesthesia   . Diverticulosis   . DYSLIPIDEMIA   . Gastritis   . GERD (gastroesophageal reflux disease)   . Hemorrhoids   . Hiatal hernia   . HYPERTENSION   . Macular degeneration    optho q61mo - Hecker  . Meningioma ([place])   . MITRAL VALVE PROLAPSE   . NEPHROLITHIASIS   . OSTEOARTHRITIS   . OSTEOPOROSIS   . PONV (postoperative nausea and vomiting)   . Thoracic aortic aneurysm (HCC)    4.1 cm 2015    Past Surgical History:  Procedure Laterality Date  . ABDOMINAL HYSTERECTOMY    . APPENDECTOMY    . BREAST SURGERY    . [place]   no PCI, performed in [place]  . Cataract surgery  2000  . CHOLECYSTECTOMY    . COLONOSCOPY    . ESOPHAGOGASTRODUODENOSCOPY    . EUS N/A 05/16/2014   Procedure: UPPER ENDOSCOPIC ULTRASOUND (EUS) LINEAR;  Surgeon: Milus Banister, MD;  [facility];  Service: Endoscopy;  Laterality: N/A;  . INSERTION OF MESH  07/04/2012   Procedure: INSERTION OF MESH;  Surgeon: Odis Hollingshead, MD;  Location: [facility];  Service: General;  Laterality: N/A;  . KNEE SURGERY    . LEG SKIN LESION  BIOPSY / EXCISION Left    8 days ago pending pathology, remains with dressing.  . SURGERY OF LIP  in 2002  . TONSILLECTOMY    . VENTRAL HERNIA REPAIR  07/04/2012   Procedure: HERNIA REPAIR VENTRAL ADULT;  Surgeon: Odis Hollingshead, MD;  Location: [facility];  Service: General;  Laterality: N/A;    Allergies  Allergen Reactions  . Pentothal [Thiopental] Nausea And Vomiting  . Codeine   . Levofloxacin   . Oxycodone-Acetaminophen   . Sulfonamide Derivatives     REACTION: GI upset/nausea/vomiting  . Tape Rash    Per patient adhesive tape    Outpatient Encounter Medications as of 06/20/2019  Medication  Sig  . acetaminophen (TYLENOL) 500 MG tablet Take 500 mg by mouth daily as needed.   . Ascorbic Acid (VITAMIN C) 500 MG tablet Take 500 mg by mouth daily.    Marland Kitchen aspirin 81 MG tablet Take 81 mg by mouth at bedtime.   . calcium-vitamin D (CALCIUM 500+D) 500-200 MG-UNIT per tablet Take 1 tablet by mouth daily.    . Cholecalciferol (VITAMIN D3) 2000 UNITS TABS Take 2,000 Units by mouth daily.    Marland Kitchen esomeprazole (NEXIUM) 40 MG capsule TAKE 1 CAPSULE BY MOUTH 30-60 MIN BEFORE YOUR FIRST AND LAST MEAL OF THE DAY  . memantine (NAMENDA) 10 MG tablet Take 1 tablet (10 mg total) by mouth 2 (two) times daily.  . Multiple Vitamins-Minerals (PRESERVISION AREDS 2 PO) Take 1 tablet by mouth daily.  . simvastatin (ZOCOR) 5 MG tablet Take 1 tablet (5 mg total) by mouth at bedtime.  Marland Kitchen telmisartan (MICARDIS) 40 MG tablet Please specify directions, refills and quantity (Patient taking differently: 40 mg daily. )  . vitamin E 400 UNIT capsule Take 400 Units by mouth every morning.   No facility-administered encounter medications on file as of 06/20/2019.     Review of Systems:  Review of Systems  Health Maintenance  Topic Date Due  . INFLUENZA VACCINE  04/07/2019  . TETANUS/TDAP  06/21/2026  . DEXA SCAN  Completed  . PNA vac Low Risk Adult  Completed    Physical Exam: Vitals:   06/20/19 1551  BP: 134/70  Pulse: 68  Temp: 98.1 F (36.7 C)  SpO2: 96%  Weight: 117 lb (53.1 kg)  Height: 5\' 5"  (1.651 m)   Body mass index is 19.47 kg/m. Physical Exam  Labs reviewed: Basic Metabolic Panel: Recent Labs    06/22/18 1529 02/26/19 0805  NA 138 141  K 3.9 3.9  CL 101 103  CO2 29 27  GLUCOSE 97 83  BUN 15 13  CREATININE 0.57 0.61  CALCIUM 9.8 9.9  TSH 2.20 1.56   Liver Function Tests: Recent Labs    06/22/18 1529 02/26/19 0805  AST 16 14  ALT 13 8  ALKPHOS 80  --   BILITOT 0.5 1.1  PROT 6.8 6.6  ALBUMIN 4.3  --    No results for input(s): LIPASE, AMYLASE in the last 8760 hours. No  results for input(s): AMMONIA in the last 8760 hours. CBC: Recent Labs    06/22/18 1529 02/26/19 0805  WBC 5.9 7.8  NEUTROABS 3.9 4,579  HGB 14.4 14.1  HCT 43.5 41.7  MCV 91.8 91.0  PLT 187.0 223   Lipid Panel: Recent  Labs    06/22/18 1529 02/26/19 0805  CHOL 184 231*  HDL 86.70 98  LDLCALC 81 112*  TRIG 81.0 99  CHOLHDL 2 2.4   No results found for: HGBA1C  Procedures since last visit: No results found.  Assessment/Plan Thoracic aortic aneurysm without rupture and H/o Right Carotid Bruit By cardiology patient is not a candidate for any kind of intervention Her husband is aware that patient gets confused because of her dementia She is on Namenda 10 mg twice daily I offered patient's husband [facility]-dose [facility] to help her behavior issues but he wants to continue reassuring her.  Weight loss Her weight has stabilized and she has gained some since being off Aricept Lightheadedness I have told patient's husband to check her blood pressure when she complains of lightheadedness   Other stable issues Essential hypertension Controlled on Micardis  Mixed hyperlipidemia On statin LDL was high as she was missing doses before Husband is taking care of her Meds now  Gastrointestinal stromal tumor (GIST) - esophagus Follows  with Dr Benay Spice Conservative management Dementia without behavioral disturbance,  Doing well with help of her husband Increased her Namenda to 10 mg BID Patient cannot drive now Reinforced to Husband Still independent in her ADLS Repeat MMSE today was 19/30which is stable from 6 months ago Failed her Clock Drawing MRI in 2016 showedMild to moderate small vessel disease type changes. Global atrophy without hydrocephalus.Does have Meningoma  Age-related osteoporosis without current pathological fracture Has been treated with Fosamax and Prolia before per notes from 2014 Will repeat DEXA scanafter Restrictions due to Covid are  changed Unsteady gait Using Cane now No Falls    Labs/tests ordered:  * No order type specified * Next appt:  08/27/2019  Total time spent in this patient care encounter was  25_  minutes; greater than 50% of the visit spent counseling patient and staff, reviewing records , Labs and coordinating care for problems addressed at this encounter.

## 2019-06-24 ENCOUNTER — Other Ambulatory Visit: Payer: Self-pay | Admitting: Internal Medicine

## 2019-08-23 ENCOUNTER — Other Ambulatory Visit: Payer: Self-pay | Admitting: Internal Medicine

## 2019-08-27 ENCOUNTER — Other Ambulatory Visit: Payer: Self-pay

## 2019-08-27 DIAGNOSIS — E782 Mixed hyperlipidemia: Secondary | ICD-10-CM

## 2019-08-27 DIAGNOSIS — I10 Essential (primary) hypertension: Secondary | ICD-10-CM

## 2019-08-27 DIAGNOSIS — C49A0 Gastrointestinal stromal tumor, unspecified site: Secondary | ICD-10-CM

## 2019-08-27 DIAGNOSIS — R634 Abnormal weight loss: Secondary | ICD-10-CM

## 2019-08-27 DIAGNOSIS — F0390 Unspecified dementia without behavioral disturbance: Secondary | ICD-10-CM

## 2019-08-27 LAB — COMPLETE METABOLIC PANEL WITH GFR
AG Ratio: 2 (calc) (ref 1.0–2.5)
ALT: 9 U/L (ref 6–29)
AST: 13 U/L (ref 10–35)
Albumin: 4 g/dL (ref 3.6–5.1)
Alkaline phosphatase (APISO): 92 U/L (ref 37–153)
BUN: 11 mg/dL (ref 7–25)
CO2: 30 mmol/L (ref 20–32)
Calcium: 9.3 mg/dL (ref 8.6–10.4)
Chloride: 103 mmol/L (ref 98–110)
Creat: 0.61 mg/dL (ref 0.60–0.88)
GFR, Est African American: 93 mL/min/{1.73_m2} (ref >=60)
GFR, Est Non African American: 80 mL/min/{1.73_m2} (ref >=60)
Globulin: 2 g/dL (calc) (ref 1.9–3.7)
Glucose, Bld: 90 mg/dL (ref 65–99)
Potassium: 4.1 mmol/L (ref 3.5–5.3)
Sodium: 140 mmol/L (ref 135–146)
Total Bilirubin: 0.8 mg/dL (ref 0.2–1.2)
Total Protein: 6 g/dL — ABNORMAL LOW (ref 6.1–8.1)

## 2019-08-27 LAB — CBC WITH DIFFERENTIAL/PLATELET
Absolute Monocytes: 677 cells/uL (ref 200–950)
Basophils Absolute: 43 cells/uL (ref 0–200)
Basophils Relative: 0.7 %
Eosinophils Absolute: 189 cells/uL (ref 15–500)
Eosinophils Relative: 3.1 %
HCT: 43.3 % (ref 35.0–45.0)
Hemoglobin: 14.2 g/dL (ref 11.7–15.5)
Lymphs Abs: 1086 cells/uL (ref 850–3900)
MCH: 30.2 pg (ref 27.0–33.0)
MCHC: 32.8 g/dL (ref 32.0–36.0)
MCV: 92.1 fL (ref 80.0–100.0)
MPV: 10.8 fL (ref 7.5–12.5)
Monocytes Relative: 11.1 %
Neutro Abs: 4105 cells/uL (ref 1500–7800)
Neutrophils Relative %: 67.3 %
Platelets: 172 10*3/uL (ref 140–400)
RBC: 4.7 10*6/uL (ref 3.80–5.10)
RDW: 11.8 % (ref 11.0–15.0)
Total Lymphocyte: 17.8 %
WBC: 6.1 10*3/uL (ref 3.8–10.8)

## 2019-08-27 LAB — LIPID PANEL
Cholesterol: 194 mg/dL (ref <200)
HDL: 87 mg/dL (ref >=50)
LDL Cholesterol (Calc): 87 mg/dL (calc)
Non-HDL Cholesterol (Calc): 107 mg/dL (calc) (ref <130)
Total CHOL/HDL Ratio: 2.2 (calc) (ref <5.0)
Triglycerides: 104 mg/dL (ref <150)

## 2019-08-27 LAB — VITAMIN B12: Vitamin B-12: 320 pg/mL (ref 200–1100)

## 2019-08-29 ENCOUNTER — Encounter: Payer: Self-pay | Admitting: Internal Medicine

## 2019-09-24 ENCOUNTER — Other Ambulatory Visit: Payer: Self-pay | Admitting: Internal Medicine

## 2019-09-26 ENCOUNTER — Encounter: Payer: Self-pay | Admitting: Internal Medicine

## 2019-09-26 ENCOUNTER — Non-Acute Institutional Stay: Payer: Medicare Other | Admitting: Internal Medicine

## 2019-09-26 ENCOUNTER — Other Ambulatory Visit: Payer: Self-pay

## 2019-09-26 VITALS — BP 140/86 | HR 66 | Temp 97.9°F | Ht 65.0 in | Wt 119.7 lb

## 2019-09-26 DIAGNOSIS — I10 Essential (primary) hypertension: Secondary | ICD-10-CM

## 2019-09-26 DIAGNOSIS — F0391 Unspecified dementia with behavioral disturbance: Secondary | ICD-10-CM

## 2019-09-26 DIAGNOSIS — R42 Dizziness and giddiness: Secondary | ICD-10-CM | POA: Diagnosis not present

## 2019-09-26 DIAGNOSIS — M810 Age-related osteoporosis without current pathological fracture: Secondary | ICD-10-CM

## 2019-09-26 DIAGNOSIS — I712 Thoracic aortic aneurysm, without rupture, unspecified: Secondary | ICD-10-CM

## 2019-09-26 DIAGNOSIS — E785 Hyperlipidemia, unspecified: Secondary | ICD-10-CM

## 2019-09-26 NOTE — Progress Notes (Signed)
Location:  [place] of Service:  Clinic (12)  Provider:   Code Status:  Goals of Care:  Advanced Directives 12/06/2018  Does Patient Have a Medical Advance Directive? No  Type of Advance Directive -  Does patient want to make changes to medical advance directive? No - Patient declined  Copy of [place] in Chart? -  Would patient like information on creating a medical advance directive? No - Patient declined     Chief Complaint  Patient presents with  . Medical Management of Chronic Issues    Patient complains of some mornings awakening lightheaded    HPI: Patient is a 86 y.o. female seen today for medical management of chronic diseases.    Patient has a history of recurrent UTI, thoracic aneurysm 4.3 cm, history of gist tumor, moderate dementia with weight loss and unsteady gait.  Dementia Her husband has hired Marine scientist now to help with her during day time. Which is helping her with her [place] . She still has behaviro issues at night and hard sometimes for her husband to manage. Still independent in her ADLS. Walks with Cane Dizziness Positional Says Feels funny in head when stands up in the morning.Hard to get detail history from her. Denies any Passing out or Falls. Or weak in legs H/o Thoracic Aneurysm Not following with Dr Jenkins [place] anymore No Intervention as very frail Weight loss Has now started gaining weight so not the issue  Past Medical History:  Diagnosis Date  . Anxiety   . Benign fundic gland polyps of stomach   . Benign gastrointestinal stromal tumor (GIST)   . Bronchitis   . Cancer ([place])    recent skin cancer left leg  excised about 8 days ago-remains with dressing intact.   . Chronic cystitis   . COLONIC POLYPS, HX OF   . Complication of anesthesia   . Diverticulosis   . DYSLIPIDEMIA   . Gastritis   . GERD (gastroesophageal reflux disease)   . Hemorrhoids   . Hiatal hernia   . HYPERTENSION   . Macular  degeneration    optho q87mo - Hecker  . Meningioma ([place])   . MITRAL VALVE PROLAPSE   . NEPHROLITHIASIS   . OSTEOARTHRITIS   . OSTEOPOROSIS   . PONV (postoperative nausea and vomiting)   . Thoracic aortic aneurysm (HCC)    4.1 cm 2015    Past Surgical History:  Procedure Laterality Date  . ABDOMINAL HYSTERECTOMY    . APPENDECTOMY    . BREAST SURGERY    . [place]   no PCI, performed in [place]  . Cataract surgery  2000  . CHOLECYSTECTOMY    . COLONOSCOPY    . ESOPHAGOGASTRODUODENOSCOPY    . EUS N/A 05/16/2014   Procedure: UPPER ENDOSCOPIC ULTRASOUND (EUS) LINEAR;  Surgeon: Milus Banister, MD;  Location: [facility];  Service: Endoscopy;  Laterality: N/A;  . INSERTION OF MESH  07/04/2012   Procedure: INSERTION OF MESH;  Surgeon: Odis Hollingshead, MD;  Location: [facility];  Service: General;  Laterality: N/A;  . KNEE SURGERY    . LEG SKIN LESION  BIOPSY / EXCISION Left    8 days ago pending pathology, remains with dressing.  . SURGERY OF LIP  in 2002  . TONSILLECTOMY    . VENTRAL HERNIA REPAIR  07/04/2012   Procedure: HERNIA REPAIR VENTRAL ADULT;  Surgeon: Odis Hollingshead, MD;  Location: [facility];  Service: General;  Laterality: N/A;    Allergies  Allergen Reactions  . Pentothal [Thiopental] Nausea And Vomiting  . Codeine   . Levofloxacin   . Oxycodone-Acetaminophen   . Sulfonamide Derivatives     REACTION: GI upset/nausea/vomiting  . Tape Rash    Per patient adhesive tape    Outpatient Encounter Medications as of 09/26/2019  Medication Sig  . acetaminophen (TYLENOL) 500 MG tablet Take 500 mg by mouth daily as needed.   . Ascorbic Acid (VITAMIN C) 500 MG tablet Take 500 mg by mouth daily.    Marland Kitchen esomeprazole (NEXIUM) 40 MG capsule TAKE 1 CAPSULE BY MOUTH 30-60 MIN BEFORE YOUR FIRST AND LAST MEAL OF THE DAY  . memantine (NAMENDA) 10 MG tablet TAKE 1 TABLET BY MOUTH TWICE A DAY  . Multiple Vitamins-Minerals (PRESERVISION AREDS 2 PO) Take 1 tablet by mouth  daily.  . simvastatin (ZOCOR) 5 MG tablet TAKE 1 TABLET BY MOUTH EVERYDAY AT BEDTIME  . telmisartan (MICARDIS) 40 MG tablet Please specify directions, refills and quantity (Patient taking differently: 40 mg daily. )  . [DISCONTINUED] aspirin 81 MG tablet Take 81 mg by mouth at bedtime.   . [DISCONTINUED] calcium-vitamin D (CALCIUM 500+D) 500-200 MG-UNIT per tablet Take 1 tablet by mouth daily.    . [DISCONTINUED] Cholecalciferol (VITAMIN D3) 2000 UNITS TABS Take 2,000 Units by mouth daily.    . [DISCONTINUED] vitamin E 400 UNIT capsule Take 400 Units by mouth every morning.   No facility-administered encounter medications on file as of 09/26/2019.    Review of Systems:  Review of Systems  All other systems reviewed and are negative.   Asper Presenting Complain. Rest is negative  Health Maintenance  Topic Date Due  . TETANUS/TDAP  06/21/2026  . INFLUENZA VACCINE  Completed  . DEXA SCAN  Completed  . PNA vac Low Risk Adult  Completed    Physical Exam: Vitals:   09/26/19 1325 09/26/19 1326  BP: (!) 144/90 140/86  Pulse: 66   Temp: 97.9 F (36.6 C)   SpO2: 98%   Weight: 119 lb 11.2 oz (54.3 kg)   Height: 5\' 5"  (1.651 m)    Body mass index is 19.92 kg/m. Physical Exam  Constitutional:  Well-developed and well-nourished.  HENT:  Head: Normocephalic. Ears are Normal Mouth/Throat: Oropharynx is clear and moist.  Eyes: Pupils are equal, round, and reactive to light.  Neck: Neck supple.  Cardiovascular: Normal rate and normal heart sounds.  No murmur heard. Pulmonary/Chest: Effort normal and breath sounds normal. No respiratory distress. No wheezes. She has no rales.  Abdominal: Soft. Bowel sounds are normal. No distension. There is no tenderness. There is no rebound.  Musculoskeletal: Mild Edema Bilateral Lymphadenopathy: none Neurological: NO Focal Deficits. Walked with [employer] with no issues Did complain of feeling Funny when she stood up Orthostatics negative Skin: Skin is  warm and dry.  Psychiatric: Normal mood and affect. Behavior is normal. Thought content normal.    Labs reviewed: Basic Metabolic Panel: Recent Labs    02/26/19 0805 08/27/19 0000  NA 141 140  K 3.9 4.1  CL 103 103  CO2 27 30  GLUCOSE 83 90  BUN 13 11  CREATININE 0.61 0.61  CALCIUM 9.9 9.3  TSH 1.56  --    Liver Function Tests: Recent Labs    02/26/19 0805 08/27/19 0000  AST 14 13  ALT 8 9  BILITOT 1.1 0.8  PROT 6.6 6.0*   No results for input(s): LIPASE, AMYLASE in the last 8760  hours. No results for input(s): AMMONIA in the last 8760 hours. CBC: Recent Labs    02/26/19 0805 08/27/19 0000  WBC 7.8 6.1  NEUTROABS 4,579 4,105  HGB 14.1 14.2  HCT 41.7 43.3  MCV 91.0 92.1  PLT 223 172   Lipid Panel: Recent Labs    02/26/19 0805 08/27/19 0000  CHOL 231* 194  HDL 98 87  LDLCALC 112* 87  TRIG 99 104  CHOLHDL 2.4 2.2   No results found for: HGBA1C  Procedures since last visit: No results found.  Assessment/Plan Lightheadedness/ Positional Vertigo At this time she is stable Will continue to monitor If gets worse ? Meclizine and therapy  Essential hypertension Controlled on Micardis Orthostatic was negative today Thoracic aortic aneurysm without rupture (HCC) Not Candidate for further Work up Dementia with behavioral disturbance, unspecified dementia type ([place]) On Nam[place] Now has nursing help Has done speech before. He will consider AL if home health does not help We talked about Seroquel Low Dose PRN again if behavior getsdifficult to manage  Hyperlipidemia,  LDl Less then 100 Same dose of statin Age-related osteoporosis without current pathological fracture Has been treated with Fosamax and Prolia before per notes from 2014 Will need Repeat DEXA Unsteady gait Using Cane now No Falls    Labs/tests ordered:  * No order type specified * Next appt:  Visit date not found   Total time spent in this patient care encounter was  40_   minutes; greater than 50% of the visit spent counseling patient and staff, reviewing records , Labs and coordinating care for problems addressed at this encounter.

## 2019-10-02 ENCOUNTER — Other Ambulatory Visit: Payer: Self-pay

## 2019-10-02 ENCOUNTER — Encounter: Payer: Self-pay | Admitting: Nurse Practitioner

## 2019-10-02 ENCOUNTER — Non-Acute Institutional Stay: Payer: Medicare Other | Admitting: Nurse Practitioner

## 2019-10-02 VITALS — HR 72 | Temp 98.5°F | Wt 123.2 lb

## 2019-10-02 DIAGNOSIS — Z0000 Encounter for general adult medical examination without abnormal findings: Secondary | ICD-10-CM

## 2019-10-02 NOTE — Progress Notes (Signed)
Subjective:    Chelsea Jordan is a 86 y.o. female who presents for a Welcome to Medicare exam at clinic [facility] Cardiac Risk Factors include: advanced age (>69men, >71 women);sedentary lifestyle      Objective:    Today's Vitals   10/02/19 1428  Pulse: 72  Temp: 98.5 F (36.9 C)  SpO2: 94%  Weight: 123 lb 3.2 oz (55.9 kg)  Body mass index is 20.5 kg/m.  Medications Outpatient Encounter Medications as of 10/02/2019  Medication Sig  . acetaminophen (TYLENOL) 500 MG tablet Take 500 mg by mouth daily as needed.   Marland Kitchen esomeprazole (NEXIUM) 40 MG capsule TAKE 1 CAPSULE BY MOUTH 30-60 MIN BEFORE YOUR FIRST AND LAST MEAL OF THE DAY  . memantine (NAMENDA) 10 MG tablet TAKE 1 TABLET BY MOUTH TWICE A DAY  . Multiple Vitamins-Minerals (PRESERVISION AREDS 2 PO) Take 1 tablet by mouth daily.  . simvastatin (ZOCOR) 5 MG tablet TAKE 1 TABLET BY MOUTH EVERYDAY AT BEDTIME  . [DISCONTINUED] Ascorbic Acid (VITAMIN C) 500 MG tablet Take 500 mg by mouth daily.    . [DISCONTINUED] telmisartan (MICARDIS) 40 MG tablet Please specify directions, refills and quantity (Patient taking differently: 40 mg daily. )   No facility-administered encounter medications on file as of 10/02/2019.     History: Past Medical History:  Diagnosis Date  . Anxiety   . Benign fundic gland polyps of stomach   . Benign gastrointestinal stromal tumor (GIST)   . Bronchitis   . Cancer ([place])    recent skin cancer left leg  excised about 8 days ago-remains with dressing intact.   . Chronic cystitis   . COLONIC POLYPS, HX OF   . Complication of anesthesia   . Diverticulosis   . DYSLIPIDEMIA   . Gastritis   . GERD (gastroesophageal reflux disease)   . Hemorrhoids   . Hiatal hernia   . HYPERTENSION   . Macular degeneration    optho q52mo - Hecker  . Meningioma ([place])   . MITRAL VALVE PROLAPSE   . NEPHROLITHIASIS   . OSTEOARTHRITIS   . OSTEOPOROSIS   . PONV (postoperative nausea and vomiting)   . Thoracic  aortic aneurysm (HCC)    4.1 cm 2015   Past Surgical History:  Procedure Laterality Date  . ABDOMINAL HYSTERECTOMY    . APPENDECTOMY    . BREAST SURGERY    . [place]   no PCI, performed in [place]  . Cataract surgery  2000  . CHOLECYSTECTOMY    . COLONOSCOPY    . ESOPHAGOGASTRODUODENOSCOPY    . EUS N/A 05/16/2014   Procedure: UPPER ENDOSCOPIC ULTRASOUND (EUS) LINEAR;  Surgeon: Milus Banister, MD;  [facility];  Service: Endoscopy;  Laterality: N/A;  . INSERTION OF MESH  07/04/2012   Procedure: INSERTION OF MESH;  Surgeon: Odis Hollingshead, MD;  Location: [facility];  Service: General;  Laterality: N/A;  . KNEE SURGERY    . LEG SKIN LESION  BIOPSY / EXCISION Left    8 days ago pending pathology, remains with dressing.  . SURGERY OF LIP  in 2002  . TONSILLECTOMY    . VENTRAL HERNIA REPAIR  07/04/2012   Procedure: HERNIA REPAIR VENTRAL ADULT;  Surgeon: Odis Hollingshead, MD;  Location: [facility];  Service: General;  Laterality: N/A;    Family History  Problem Relation Age of Onset  . Heart disease Other   . Heart disease Mother   . Heart disease  Father   . Emphysema Father   . Heart attack Father   . Breast cancer Sister   . Lung cancer Sister   . Prostate cancer Son   . Prostate cancer Brother    Social History   Occupational History  . Occupation: retired  Tobacco Use  . Smoking status: Never Smoker  . Smokeless tobacco: Never Used  . Tobacco comment: Married, lives with spouse, Enjoys golf. Retired  Substance and Sexual Activity  . Alcohol use: Yes    Alcohol/week: 6.0 standard drinks    Types: 6 Standard drinks or equivalent per week    Comment: Wine -nightly  . Drug use: No  . Sexual activity: Not Currently    Tobacco Counseling Counseling given: Not Answered Comment: Married, lives with spouse, Enjoys golf. Retired   Engineer, drilling History  Administered Date(s) Administered  . Influenza Split  06/07/2011, 06/13/2012, 06/15/2013  . Influenza Whole 06/06/2009, 05/18/2010  . Influenza, High Dose Seasonal PF 06/15/2013, 06/23/2017, 06/22/2018, 04/30/2019  . Influenza-Unspecified 06/07/2015, 05/28/2016  . Moderna SARS-COVID-2 Vaccination 09/10/2019  . Pneumococcal Conjugate-13 07/12/2014  . Pneumococcal Polysaccharide-23 09/07/2007  . Td 09/07/2007  . Tdap 06/21/2016   There are no preventive care reminders to display for this patient.  Activities of Daily Living In your present state of health, do you have any difficulty performing the following activities: 10/02/2019 10/02/2019  Hearing? Tempie Donning  Vision? N N  Difficulty concentrating or making decisions? Tempie Donning  Walking or climbing stairs? N N  Dressing or bathing? N N  Doing errands, shopping? Tempie Donning  Preparing Food and eating ? N N  Using the Toilet? N N  In the past six months, have you accidently leaked urine? N N  Do you have problems with loss of bowel control? N N  Managing your Medications? Y Y  Managing your Finances? Tempie Donning  Housekeeping or managing your Housekeeping? Y Y  Some recent data might be hidden     Advanced Directives: Does Patient Have a Medical Advance Directive?: Yes Type of Advance Directive: [place] Does patient want to make changes to medical advance directive?: No - Patient declined    Assessment:    This is a routine wellness examination for this patient .  Vision/Hearing screen No exam data present  Dietary issues and exercise activities discussed:  Current Exercise Habits: The patient does not participate in regular exercise at present, Exercise limited by: Other - see comments(the patient stated there is no reason for her to exercise)  Goals    . LIFESTYLE - DECREASE FALLS RISK      Depression Screen PHQ 2/9 Scores 06/22/2018 12/26/2017 06/23/2017 07/15/2016  PHQ - 2 Score 0 0 0 0     Fall Risk Fall Risk  10/02/2019  Falls in the past year? 0  Number falls in past  yr: 0  Injury with Fall? -  Comment -  Risk Factor Category  -  Risk for fall due to : -  Follow up -    Cognitive Function: MMSE - Mini Mental State Exam 10/02/2019 02/28/2019 12/06/2018 02/25/2017  Orientation to time 0 1 2 4   Orientation to Place 2 2 3 5   Registration 2 3 3 3   Attention/ Calculation 1 5 5 3   Recall 1 0 0 3  Language- name 2 objects 2 2 2 2   Language- repeat 1 0 0 1  Language- follow 3 step command 3 3 3 3   Language-  read & follow direction 1 1 0 1  Write a sentence 1 1 1 1   Copy design 1 1 0 1  Total score 15 19 19 27         Patient Care Team: Virgie Dad, MD as PCP - General (Internal Medicine) Minus Breeding, MD as Referring Physician (Cardiology) Tanda Rockers, MD as Referring Physician (Pulmonary Disease) Gatha Mayer, MD as Referring Physician (Gastroenterology) Gaynelle Arabian, MD as Referring Physician (Orthopedic Surgery) Melida Quitter, MD as Referring Physician Jackolyn Confer, MD (General Surgery)     Plan:    I have personally reviewed and noted the following in the patient's chart:   . Medical and social history . Use of alcohol, tobacco or illicit drugs  . Current medications and supplements . Functional ability and status . Nutritional status . Physical activity . Advanced directives . List of other physicians . Hospitalizations, surgeries, and ER visits in previous 12 months . Vitals . Screenings to include cognitive, depression, and falls . Referrals and appointments  In addition, I have reviewed and discussed with patient certain preventive protocols, quality metrics, and best practice recommendations. A written personalized care plan for preventive services as well as general preventive health recommendations were provided to patient.     Yariah Selvey X Nedim Oki, NP 10/02/2019

## 2019-10-02 NOTE — Addendum Note (Signed)
Addended by: Jaycee Pelzer X on: 10/02/2019 05:04 PM   Modules accepted: Level of Service, SmartSet

## 2019-10-02 NOTE — Patient Instructions (Signed)
Chelsea Jordan , Thank you for taking time to come for your Medicare Wellness Visit. I appreciate your ongoing commitment to your health goals. Please review the following plan we discussed and let me know if I can assist you in the future.   Screening recommendations/referrals: Colonoscopy age out Mammogram age out Bone Density up to date Recommended yearly ophthalmology/optometry visit for glaucoma screening and checkup Recommended yearly dental visit for hygiene and checkup  Vaccinations: Influenza vaccine up to date Pneumococcal vaccine up to date Tdap vaccine due 2027 Shingles vaccine pending  Advanced directives: no Conditions/risks identified: advanced age female >48yrs, sedentary life style.   Next appointment: 1 year   Preventive Care 18 Years and Older, Female Preventive care refers to lifestyle choices and visits with your health care provider that can promote health and wellness. What does preventive care include?  A yearly physical exam. This is also called an annual well check.  Dental exams once or twice a year.  Routine eye exams. Ask your health care provider how often you should have your eyes checked.  Personal lifestyle choices, including:  Daily care of your teeth and gums.  Regular physical activity.  Eating a healthy diet.  Avoiding tobacco and drug use.  Limiting alcohol use.  Practicing safe sex.  Taking low-dose aspirin every day.  Taking vitamin and mineral supplements as recommended by your health care provider. What happens during an annual well check? The services and screenings done by your health care provider during your annual well check will depend on your age, overall health, lifestyle risk factors, and family history of disease. Counseling  Your health care provider may ask you questions about your:  Alcohol use.  Tobacco use.  Drug use.  Emotional well-being.  Home and relationship well-being.  Sexual  activity.  Eating habits.  History of falls.  Memory and ability to understand (cognition).  Work and work Statistician.  Reproductive health. Screening  You may have the following tests or measurements:  Height, weight, and BMI.  Blood pressure.  Lipid and cholesterol levels. These may be checked every 5 years, or more frequently if you are over 16 years old.  Skin check.  Lung cancer screening. You may have this screening every year starting at age 25 if you have a 30-pack-year history of smoking and currently smoke or have quit within the past 15 years.  Fecal occult blood test (FOBT) of the stool. You may have this test every year starting at age 43.  Flexible sigmoidoscopy or colonoscopy. You may have a sigmoidoscopy every 5 years or a colonoscopy every 10 years starting at age 66.  Hepatitis C blood test.  Hepatitis B blood test.  Sexually transmitted disease (STD) testing.  Diabetes screening. This is done by checking your blood sugar (glucose) after you have not eaten for a while (fasting). You may have this done every 1-3 years.  Bone density scan. This is done to screen for osteoporosis. You may have this done starting at age 59.  Mammogram. This may be done every 1-2 years. Talk to your health care provider about how often you should have regular mammograms. Talk with your health care provider about your test results, treatment options, and if necessary, the need for more tests. Vaccines  Your health care provider may recommend certain vaccines, such as:  Influenza vaccine. This is recommended every year.  Tetanus, diphtheria, and acellular pertussis (Tdap, Td) vaccine. You may need a Td booster every 10 years.  Zoster vaccine.  You may need this after age 51.  Pneumococcal 13-valent conjugate (PCV13) vaccine. One dose is recommended after age 2.  Pneumococcal polysaccharide (PPSV23) vaccine. One dose is recommended after age 84. Talk to your health care  provider about which screenings and vaccines you need and how often you need them. This information is not intended to replace advice given to you by your health care provider. Make sure you discuss any questions you have with your health care provider. Document Released: 09/19/2015 Document Revised: 05/12/2016 Document Reviewed: 06/24/2015 Elsevier Interactive Patient Education  2017 [place] Prevention in the Home Falls can cause injuries. They can happen to people of all ages. There are many things you can do to make your home safe and to help prevent falls. What can I do on the outside of my home?  Regularly fix the edges of walkways and driveways and fix any cracks.  Remove anything that might make you trip as you walk through a door, such as a raised step or threshold.  Trim any bushes or trees on the path to your home.  Use bright outdoor lighting.  Clear any walking paths of anything that might make someone trip, such as rocks or tools.  Regularly check to see if handrails are loose or broken. Make sure that both sides of any steps have handrails.  Any raised decks and porches should have guardrails on the edges.  Have any leaves, snow, or ice cleared regularly.  Use sand or salt on walking paths during winter.  Clean up any spills in your garage right away. This includes oil or grease spills. What can I do in the bathroom?  Use night lights.  Install grab bars by the toilet and in the tub and shower. Do not use towel bars as grab bars.  Use non-skid mats or decals in the tub or shower.  If you need to sit down in the shower, use a plastic, non-slip stool.  Keep the floor dry. Clean up any water that spills on the floor as soon as it happens.  Remove soap buildup in the tub or shower regularly.  Attach bath mats securely with double-sided non-slip rug tape.  Do not have throw rugs and other things on the floor that can make you trip. What can I do in  the bedroom?  Use night lights.  Make sure that you have a light by your bed that is easy to reach.  Do not use any sheets or blankets that are too big for your bed. They should not hang down onto the floor.  Have a firm chair that has side arms. You can use this for support while you get dressed.  Do not have throw rugs and other things on the floor that can make you trip. What can I do in the kitchen?  Clean up any spills right away.  Avoid walking on wet floors.  Keep items that you use a lot in easy-to-reach places.  If you need to reach something above you, use a strong step stool that has a grab bar.  Keep electrical cords out of the way.  Do not use floor polish or wax that makes floors slippery. If you must use wax, use non-skid floor wax.  Do not have throw rugs and other things on the floor that can make you trip. What can I do with my stairs?  Do not leave any items on the stairs.  Make sure that there are handrails on  both sides of the stairs and use them. Fix handrails that are broken or loose. Make sure that handrails are as long as the stairways.  Check any carpeting to make sure that it is firmly attached to the stairs. Fix any carpet that is loose or worn.  Avoid having throw rugs at the top or bottom of the stairs. If you do have throw rugs, attach them to the floor with carpet tape.  Make sure that you have a light switch at the top of the stairs and the bottom of the stairs. If you do not have them, ask someone to add them for you. What else can I do to help prevent falls?  Wear shoes that:  Do not have high heels.  Have rubber bottoms.  Are comfortable and fit you well.  Are closed at the toe. Do not wear sandals.  If you use a stepladder:  Make sure that it is fully opened. Do not climb a closed stepladder.  Make sure that both sides of the stepladder are locked into place.  Ask someone to hold it for you, if possible.  Clearly mark and  make sure that you can see:  Any grab bars or handrails.  First and last steps.  Where the edge of each step is.  Use tools that help you move around (mobility aids) if they are needed. These include:  Canes.  Walkers.  Scooters.  Crutches.  Turn on the lights when you go into a dark area. Replace any light bulbs as soon as they burn out.  Set up your furniture so you have a clear path. Avoid moving your furniture around.  If any of your floors are uneven, fix them.  If there are any pets around you, be aware of where they are.  Review your medicines with your doctor. Some medicines can make you feel dizzy. This can increase your chance of falling. Ask your doctor what other things that you can do to help prevent falls. This information is not intended to replace advice given to you by your health care provider. Make sure you discuss any questions you have with your health care provider. Document Released: 06/19/2009 Document Revised: 01/29/2016 Document Reviewed: 09/27/2014 Elsevier Interactive Patient Education  2017 [employer].

## 2019-10-02 NOTE — Progress Notes (Addendum)
Subjective:   Chelsea Jordan is a 86 y.o. female who presents for Medicare Annual (Subsequent) preventive examination at the clinic [facility]  Review of Systems:   Cardiac Risk Factors include: advanced age (>48men, >44 women);sedentary lifestyle     Objective:     Vitals: Pulse 72   Temp 98.5 F (36.9 C)   Wt 123 lb 3.2 oz (55.9 kg)   SpO2 94%   BMI 20.50 kg/m   Body mass index is 20.5 kg/m.  Advanced Directives 10/02/2019 12/06/2018 10/25/2018 02/20/2018 12/21/2017 12/06/2017 11/04/2017  Does Patient Have a Medical Advance Directive? Yes No Yes Yes Yes Yes Yes  Type of Product manager Power of [employer] Power of [employer] Power of [place];Living will Living will  Does patient want to make changes to medical advance directive? No - Patient declined No - Patient declined No - Patient declined No - Patient declined - - No - Patient declined  Copy of [place] in Chart? - - - Yes Yes Yes -  Would patient like information on creating a medical advance directive? - No - Patient declined - - - - -    Tobacco Social History   Tobacco Use  Smoking Status Never Smoker  Smokeless Tobacco Never Used  Tobacco Comment   Married, lives with spouse, Enjoys golf. Retired     Counseling given: Not Answered Comment: Married, lives with spouse, Enjoys golf. Retired   Clinical Intake:  Pre-visit preparation completed: Yes  Pain : No/denies pain     Nutritional Status: BMI of 19-24  Normal Nutritional Risks: None Diabetes: No  How often do you need to have someone help you when you read instructions, pamphlets, or other written materials from your doctor or pharmacy?: 1 - Never What is the last grade level you completed in school?: college  Interpreter Needed?: No  Information entered by :: Ahniya Mitchum Bretta Bang NP  Past Medical History:  Diagnosis Date  . Anxiety   .  Benign fundic gland polyps of stomach   . Benign gastrointestinal stromal tumor (GIST)   . Bronchitis   . Cancer ([place])    recent skin cancer left leg  excised about 8 days ago-remains with dressing intact.   . Chronic cystitis   . COLONIC POLYPS, HX OF   . Complication of anesthesia   . Diverticulosis   . DYSLIPIDEMIA   . Gastritis   . GERD (gastroesophageal reflux disease)   . Hemorrhoids   . Hiatal hernia   . HYPERTENSION   . Macular degeneration    optho q20mo - [place]  . Meningioma ([place])   . MITRAL VALVE PROLAPSE   . NEPHROLITHIASIS   . OSTEOARTHRITIS   . OSTEOPOROSIS   . PONV (postoperative nausea and vomiting)   . Thoracic aortic aneurysm ([facility])    4.1 cm 2015   Past Surgical History:  Procedure Laterality Date  . ABDOMINAL HYSTERECTOMY    . APPENDECTOMY    . BREAST SURGERY    . [place]   no PCI, performed in [place]  . Cataract surgery  2000  . CHOLECYSTECTOMY    . COLONOSCOPY    . ESOPHAGOGASTRODUODENOSCOPY    . EUS N/A 05/16/2014   Procedure: UPPER ENDOSCOPIC ULTRASOUND (EUS) LINEAR;  Surgeon: Milus Banister, MD;  Location: [facility];  Service: Endoscopy;  Laterality: N/A;  . INSERTION OF MESH  07/04/2012   Procedure: INSERTION  OF MESH;  Surgeon: Odis Hollingshead, MD;  Location: [facility];  Service: General;  Laterality: N/A;  . KNEE SURGERY    . LEG SKIN LESION  BIOPSY / EXCISION Left    8 days ago pending pathology, remains with dressing.  . SURGERY OF LIP  in 2002  . TONSILLECTOMY    . VENTRAL HERNIA REPAIR  07/04/2012   Procedure: HERNIA REPAIR VENTRAL ADULT;  Surgeon: Odis Hollingshead, MD;  Location: [facility];  Service: General;  Laterality: N/A;   Family History  Problem Relation Age of Onset  . Heart disease Other   . Heart disease Mother   . Heart disease Father   . Emphysema Father   . Heart attack Father   . Breast cancer Sister   . Lung cancer Sister   . Prostate cancer Son   . Prostate cancer Brother    Social History     Socioeconomic History  . Marital status: Married    Spouse name: Not on file  . Number of children: 2  . Years of education: Not on file  . Highest education level: Not on file  Occupational History  . Occupation: retired  Tobacco Use  . Smoking status: Never Smoker  . Smokeless tobacco: Never Used  . Tobacco comment: Married, lives with spouse, Enjoys golf. Retired  Substance and Sexual Activity  . Alcohol use: Yes    Alcohol/week: 6.0 standard drinks    Types: 6 Standard drinks or equivalent per week    Comment: Wine -nightly  . Drug use: No  . Sexual activity: Not Currently  Other Topics Concern  . Not on file  Social History Narrative   Married, lives with spouse - enjoys golf   Lives at [place]?       Do you drink/eat things with caffeine? Yes 3 cups of coffee per day      Marital status?         Married            What year were you married? 1953       Do you live in a house, apartment, assisted living, condo, trailer, etc.? [facility]      Is it one or more stories? 3       How many persons live in your home? 2       Do you have any pets in your home? (please list) no      Highest level of education completed? High school       Current or past profession: draftman [employer]      Do you exercise?                  yes                    Type & how often? Walking daily       Advanced Directives      Do you have a living will? yes      Do you have a DNR form?                                  If not, do you want to discuss one? yes      Do you have signed POA/HPOA for forms? Yes       Functional Status  Do you have difficulty bathing or dressing yourself? No       Do you have difficulty preparing food or eating?  No       Do you have difficulty managing your medications? No       Do you have difficulty managing your finances? No       Do you have difficulty affording your medications? No       Social  Determinants of Health   Financial Resource Strain:   . Difficulty of Paying Living Expenses: Not on file  Food Insecurity:   . Worried About Charity fundraiser in the Last Year: Not on file  . Ran Out of Food in the Last Year: Not on file  Transportation Needs:   . Lack of Transportation (Medical): Not on file  . Lack of Transportation (Non-Medical): Not on file  Physical Activity:   . Days of Exercise per Week: Not on file  . Minutes of Exercise per Session: Not on file  Stress:   . Feeling of Stress : Not on file  Social Connections:   . Frequency of Communication with Friends and Family: Not on file  . Frequency of Social Gatherings with Friends and Family: Not on file  . Attends Religious Services: Not on file  . Active Member of Clubs or Organizations: Not on file  . Attends Archivist Meetings: Not on file  . Marital Status: Not on file    Outpatient Encounter Medications as of 10/02/2019  Medication Sig  . acetaminophen (TYLENOL) 500 MG tablet Take 500 mg by mouth daily as needed.   Marland Kitchen esomeprazole (NEXIUM) 40 MG capsule TAKE 1 CAPSULE BY MOUTH 30-60 MIN BEFORE YOUR FIRST AND LAST MEAL OF THE DAY  . memantine (NAMENDA) 10 MG tablet TAKE 1 TABLET BY MOUTH TWICE A DAY  . Multiple Vitamins-Minerals (PRESERVISION AREDS 2 PO) Take 1 tablet by mouth daily.  . simvastatin (ZOCOR) 5 MG tablet TAKE 1 TABLET BY MOUTH EVERYDAY AT BEDTIME  . [DISCONTINUED] Ascorbic Acid (VITAMIN C) 500 MG tablet Take 500 mg by mouth daily.    . [DISCONTINUED] telmisartan (MICARDIS) 40 MG tablet Please specify directions, refills and quantity (Patient taking differently: 40 mg daily. )   No facility-administered encounter medications on file as of 10/02/2019.    Activities of Daily Living In your present state of health, do you have any difficulty performing the following activities: 10/02/2019 10/02/2019  Hearing? Tempie Donning  Vision? N N  Difficulty concentrating or making decisions? Tempie Donning  Walking  or climbing stairs? N N  Dressing or bathing? N N  Doing errands, shopping? Tempie Donning  Preparing Food and eating ? N N  Using the Toilet? N N  In the past six months, have you accidently leaked urine? N N  Do you have problems with loss of bowel control? N N  Managing your Medications? Y Y  Managing your Finances? Tempie Donning  Housekeeping or managing your Housekeeping? Tempie Donning  Some recent data might be hidden    Patient Care Team: Virgie Dad, MD as PCP - General (Internal Medicine) Minus Breeding, MD as Referring Physician (Cardiology) Tanda Rockers, MD as Referring Physician (Pulmonary Disease) Gatha Mayer, MD as Referring Physician (Gastroenterology) Gaynelle Arabian, MD as Referring Physician (Orthopedic Surgery) Melida Quitter, MD as Referring Physician Jackolyn Confer, MD (General Surgery)    Assessment:   This is a routine wellness examination for Chelsea Jordan.  Exercise Activities and Dietary recommendations Current  Exercise Habits: The patient does not participate in regular exercise at present, Exercise limited by: Other - see comments(the patient stated there is no reason for her to exercise)  Goals    . LIFESTYLE - DECREASE FALLS RISK       Fall Risk Fall Risk  10/02/2019 09/26/2019 06/20/2019 02/28/2019 02/14/2019  Falls in the past year? 0 0 1 1 1   Number falls in past yr: 0 0 0 0 0  Injury with Fall? - - 1 1 1   Comment - - - - -  Risk Factor Category  - - - - -  Risk for fall due to : - - - - -  Follow up - - - - -   Is the patient's home free of loose throw rugs in walkways, pet beds, electrical cords, etc?   yes      Grab bars in the bathroom? yes      Handrails on the stairs?   yes      Adequate lighting?   yes  Timed Get Up and Go performed: 5 seconds  Depression Screen PHQ 2/9 Scores 06/22/2018 12/26/2017 06/23/2017 07/15/2016  PHQ - 2 Score 0 0 0 0     Cognitive Function MMSE - Mini Mental State Exam 10/02/2019 02/28/2019 12/06/2018 02/25/2017  Orientation to time  0 1 2 4   Orientation to Place 2 2 3 5   Registration 2 3 3 3   Attention/ Calculation 1 5 5 3   Recall 1 0 0 3  Language- name 2 objects 2 2 2 2   Language- repeat 1 0 0 1  Language- follow 3 step command 3 3 3 3   Language- read & follow direction 1 1 0 1  Write a sentence 1 1 1 1   Copy design 1 1 0 1  Total score 15 19 19 27         Immunization History  Administered Date(s) Administered  . Influenza Split 06/07/2011, 06/13/2012, 06/15/2013  . Influenza Whole 06/06/2009, 05/18/2010  . Influenza, High Dose Seasonal PF 06/15/2013, 06/23/2017, 06/22/2018, 04/30/2019  . Influenza-Unspecified 06/07/2015, 05/28/2016  . Moderna SARS-COVID-2 Vaccination 09/10/2019  . Pneumococcal Conjugate-13 07/12/2014  . Pneumococcal Polysaccharide-23 09/07/2007  . Td 09/07/2007  . Tdap 06/21/2016    Qualifies for Shingles Vaccine? yes  Screening Tests Health Maintenance  Topic Date Due  . TETANUS/TDAP  06/21/2026  . INFLUENZA VACCINE  Completed  . DEXA SCAN  Completed  . PNA vac Low Risk Adult  Completed    Cancer Screenings: Lung: Low Dose CT Chest recommended if Age 70-80 years, 30 pack-year currently smoking OR have quit w/in 15years. Patient does not qualify. Breast:  Up to date on Mammogram? No   Up to date of Bone Density/Dexa? Yes Colorectal: age out  Additional Screenings: Hepatitis C Screening: low risk     Plan:   up to date  I have personally reviewed and noted the following in the patient's chart:   . Medical and social history . Use of alcohol, tobacco or illicit drugs  . Current medications and supplements . Functional ability and status . Nutritional status . Physical activity . Advanced directives . List of other physicians . Hospitalizations, surgeries, and ER visits in previous 12 months . Vitals . Screenings to include cognitive, depression, and falls . Referrals and appointments  In addition, I have reviewed and discussed with patient certain preventive  protocols, quality metrics, and best practice recommendations. A written personalized care plan for preventive services as well as general  preventive health recommendations were provided to patient.     Sanel Stemmer X Avey Mcmanamon, NP  10/05/2019

## 2019-10-03 NOTE — Addendum Note (Signed)
Addended by: Saif Peter X on: 10/03/2019 11:22 AM   Modules accepted: Level of Service

## 2019-12-18 ENCOUNTER — Other Ambulatory Visit: Payer: Self-pay | Admitting: Internal Medicine

## 2020-01-01 ENCOUNTER — Non-Acute Institutional Stay: Payer: Medicare Other | Admitting: Nurse Practitioner

## 2020-01-01 ENCOUNTER — Encounter: Payer: Self-pay | Admitting: Nurse Practitioner

## 2020-01-01 DIAGNOSIS — C49A0 Gastrointestinal stromal tumor, unspecified site: Secondary | ICD-10-CM

## 2020-01-01 DIAGNOSIS — F0391 Unspecified dementia with behavioral disturbance: Secondary | ICD-10-CM | POA: Diagnosis not present

## 2020-01-01 DIAGNOSIS — E782 Mixed hyperlipidemia: Secondary | ICD-10-CM | POA: Diagnosis not present

## 2020-01-01 NOTE — Assessment & Plan Note (Signed)
Stable, continue PPI Esomeprazole

## 2020-01-01 NOTE — Assessment & Plan Note (Signed)
Continue AL [facility] for safety, care assistance, continue Memantine.

## 2020-01-01 NOTE — Progress Notes (Signed)
Location:    [facility] Room Number: 12 Place of Service:  ALF [phone]) Provider:  Marda Stalker, Lennie Odor NP   Virgie Dad, MD  Patient Care Team: Virgie Dad, MD as PCP - General (Internal Medicine) Minus Breeding, MD as Referring Physician (Cardiology) Tanda Rockers, MD as Referring Physician (Pulmonary Disease) Gatha Mayer, MD as Referring Physician (Gastroenterology) Gaynelle Arabian, MD as Referring Physician (Orthopedic Surgery) Melida Quitter, MD as Referring Physician Jackolyn Confer, MD (General Surgery)  Extended Emergency Contact Information Primary Emergency Contact:  [facility] Address: [place], [place] [address] [name] of [place] Phone: [phone] Mobile Phone: [phone] Relation: Spouse Secondary Emergency Contact: Marcina Millard of [place] Phone: [phone] Mobile Phone: [phone] Relation: Son  Code Status:  DNR Goals of care: Advanced Directive information Advanced Directives 01/02/2020  Does Patient Have a Medical Advance Directive? Yes  Type of Advance Directive Living will;Healthcare Power of Attorney  Does patient want to make changes to medical advance directive? No - Patient declined  Copy of [place] in Chart? Yes - validated most recent copy scanned in chart (See row information)  Would patient like information on creating a medical advance directive? -     Chief Complaint  Patient presents with  . Acute Visit    Medication review    HPI:  Pt is a 86 y.o. female seen today for an acute visit for medication review.   The patient admitted to [facility] for higher level of care and safety due to progression of her dementia, on Memantine for memory. Hx of GERD/GIST, stable, on Esomeprazole. Hyperlipidemia, LDL at goal, on Simvastatin 5mg  qd.   Past Medical History:  Diagnosis Date  . Anxiety   . Benign fundic gland polyps of stomach   .  Benign gastrointestinal stromal tumor (GIST)   . Bronchitis   . Cancer ([facility])    recent skin cancer left leg  excised about 8 days ago-remains with dressing intact.   . Chronic cystitis   . COLONIC POLYPS, HX OF   . Complication of anesthesia   . Diverticulosis   . DYSLIPIDEMIA   . Gastritis   . GERD (gastroesophageal reflux disease)   . Hemorrhoids   . Hiatal hernia   . HYPERTENSION   . Macular degeneration    optho q61mo - [facility]  . Meningioma ([facility])   . MITRAL VALVE PROLAPSE   . NEPHROLITHIASIS   . OSTEOARTHRITIS   . OSTEOPOROSIS   . PONV (postoperative nausea and vomiting)   . Thoracic aortic aneurysm ([facility])    4.1 cm 2015   Past Surgical History:  Procedure Laterality Date  . ABDOMINAL HYSTERECTOMY    . APPENDECTOMY    . BREAST SURGERY    . [place]   no PCI, performed in [place]  . Cataract surgery  2000  . CHOLECYSTECTOMY    . COLONOSCOPY    . ESOPHAGOGASTRODUODENOSCOPY    . EUS N/A 05/16/2014   Procedure: UPPER ENDOSCOPIC ULTRASOUND (EUS) LINEAR;  Surgeon: Milus Banister, MD;  Location: [facility];  Service: Endoscopy;  Laterality: N/A;  . INSERTION OF MESH  07/04/2012   Procedure: INSERTION OF MESH;  Surgeon: Odis Hollingshead, MD;  Location: [facility];  Service: General;  Laterality: N/A;  . KNEE SURGERY    . LEG SKIN LESION  BIOPSY / EXCISION Left    8 days ago  pending pathology, remains with dressing.  . SURGERY OF LIP  in 2002  . TONSILLECTOMY    . VENTRAL HERNIA REPAIR  07/04/2012   Procedure: HERNIA REPAIR VENTRAL ADULT;  Surgeon: Odis Hollingshead, MD;  Location: [facility];  Service: General;  Laterality: N/A;    Allergies  Allergen Reactions  . Pentothal [Thiopental] Nausea And Vomiting  . Codeine   . Levofloxacin   . Oxycodone-Acetaminophen   . Sulfonamide Derivatives     REACTION: GI upset/nausea/vomiting  . Tape Rash    Per patient adhesive tape    Allergies as of 01/01/2020      Reactions   Pentothal [thiopental] Nausea And  Vomiting   Codeine    Levofloxacin    Oxycodone-acetaminophen    Sulfonamide Derivatives    REACTION: GI upset/nausea/vomiting   Tape Rash   Per patient adhesive tape      Medication List       Accurate as of January 01, 2020 11:59 PM. If you have any questions, ask your nurse or doctor.        STOP taking these medications   PRESERVISION AREDS 2 PO Stopped by: Bertin Inabinet X Paula Zietz, NP     TAKE these medications   acetaminophen 500 MG tablet Commonly known as: TYLENOL Take 500 mg by mouth daily as needed.   esomeprazole 40 MG capsule Commonly known as: NEXIUM TAKE 1 CAPSULE BY MOUTH 30-60 MIN BEFORE YOUR FIRST AND LAST MEAL OF THE DAY   memantine 10 MG tablet Commonly known as: NAMENDA TAKE 1 TABLET BY MOUTH TWICE A DAY   simvastatin 5 MG tablet Commonly known as: ZOCOR TAKE 1 TABLET BY MOUTH EVERYDAY AT BEDTIME   Tubersol 5 UNIT/0.1ML injection Generic drug: tuberculin Inject into the skin once.       Review of Systems  Constitutional: Negative for activity change, appetite change, chills, diaphoresis, fatigue, fever and unexpected weight change.  HENT: Positive for hearing loss. Negative for congestion, trouble swallowing and voice change.   Eyes: Negative for visual disturbance.  Respiratory: Negative for cough, shortness of breath and wheezing.   Cardiovascular: Negative for leg swelling.  Gastrointestinal: Negative for abdominal distention, abdominal pain, constipation, nausea and vomiting.  Genitourinary: Negative for difficulty urinating, dysuria and urgency.  Musculoskeletal: Negative for gait problem.  Skin: Negative for color change and pallor.  Neurological: Negative for dizziness, speech difficulty, weakness and headaches.       Dementia  Psychiatric/Behavioral: Negative for agitation, behavioral problems, hallucinations and sleep disturbance. The patient is not nervous/anxious.     Immunization History  Administered Date(s) Administered  . Influenza  Split 06/07/2011, 06/13/2012, 06/15/2013  . Influenza Whole 06/06/2009, 05/18/2010  . Influenza, High Dose Seasonal PF 06/15/2013, 06/23/2017, 06/22/2018, 04/30/2019  . Influenza-Unspecified 06/07/2015, 05/28/2016  . Moderna SARS-COVID-2 Vaccination 09/10/2019  . Pneumococcal Conjugate-13 07/12/2014  . Pneumococcal Polysaccharide-23 09/07/2007  . Td 09/07/2007  . Tdap 06/21/2016   Pertinent  Health Maintenance Due  Topic Date Due  . INFLUENZA VACCINE  04/06/2020  . DEXA SCAN  Completed  . PNA vac Low Risk Adult  Completed   Fall Risk  10/02/2019 09/26/2019 06/20/2019 02/28/2019 02/14/2019  Falls in the past year? 0 0 1 1 1   Number falls in past yr: 0 0 0 0 0  Injury with Fall? - - 1 1 1   Comment - - - - -  Risk Factor Category  - - - - -  Risk for fall due to : - - - - -  Follow up - - - - -   Functional Status Survey:    Vitals:   01/01/20 1056  BP: (!) 149/81  Pulse: 60  Resp: 16  Temp: (!) 97 F (36.1 C)  SpO2: 96%  Weight: 116 lb 6.4 oz (52.8 kg)  Height: 5' (1.524 m)   Body mass index is 22.73 kg/m. Physical Exam Vitals and nursing note reviewed.  Constitutional:      General: She is not in acute distress.    Appearance: Normal appearance. She is normal weight. She is not ill-appearing, toxic-appearing or diaphoretic.  HENT:     Head: Normocephalic and atraumatic.     Nose: Nose normal.     Mouth/Throat:     Mouth: Mucous membranes are moist.  Eyes:     Extraocular Movements: Extraocular movements intact.     Conjunctiva/sclera: Conjunctivae normal.     Pupils: Pupils are equal, round, and reactive to light.  Cardiovascular:     Rate and Rhythm: Normal rate and regular rhythm.     Heart sounds: Murmur present.  Pulmonary:     Breath sounds: Normal breath sounds. No wheezing, rhonchi or rales.  Abdominal:     General: Bowel sounds are normal. There is no distension.     Palpations: Abdomen is soft.     Tenderness: There is no abdominal tenderness. There  is no right CVA tenderness, left CVA tenderness, guarding or rebound.  Musculoskeletal:     Cervical back: Normal range of motion and neck supple.     Right lower leg: No edema.     Left lower leg: No edema.  Skin:    General: Skin is warm and dry.  Neurological:     General: No focal deficit present.     Mental Status: She is alert. Mental status is at baseline.     Motor: No weakness.     Coordination: Coordination normal.     Gait: Gait normal.     Comments: Oriented to person, place.   Psychiatric:        Mood and Affect: Mood normal.        Behavior: Behavior normal.     Labs reviewed: Recent Labs    02/26/19 0805 08/27/19 0000  NA 141 140  K 3.9 4.1  CL 103 103  CO2 27 30  GLUCOSE 83 90  BUN 13 11  CREATININE 0.61 0.61  CALCIUM 9.9 9.3   Recent Labs    02/26/19 0805 08/27/19 0000  AST 14 13  ALT 8 9  BILITOT 1.1 0.8  PROT 6.6 6.0*   Recent Labs    02/26/19 0805 08/27/19 0000  WBC 7.8 6.1  NEUTROABS 4,579 4,105  HGB 14.1 14.2  HCT 41.7 43.3  MCV 91.0 92.1  PLT 223 172   Lab Results  Component Value Date   TSH 1.56 02/26/2019   No results found for: HGBA1C Lab Results  Component Value Date   CHOL 194 08/27/2019   HDL 87 08/27/2019   LDLCALC 87 08/27/2019   LDLDIRECT 97.7 06/22/2013   TRIG 104 08/27/2019   CHOLHDL 2.2 08/27/2019    Significant Diagnostic Results in last 30 days:  No results found.  Assessment/Plan Gastrointestinal stromal tumor (GIST) - esophagus Stable, continue PPI Esomeprazole   Dementia ([place]) Continue [facility] for safety, care assistance, continue Memantine.   Hyperlipidemia LDL at goal, continue statin Simvastatin 5mg  qd.      Family/ staff Communication: plan of care reviewed with the patient, the  patient's husband, and Camera operator.   Labs/tests ordered:  none  Time spend 40 minutes.

## 2020-01-01 NOTE — Assessment & Plan Note (Signed)
LDL at goal, continue statin Simvastatin 5mg  qd.

## 2020-01-02 ENCOUNTER — Non-Acute Institutional Stay: Payer: Medicare Other | Admitting: Internal Medicine

## 2020-01-02 ENCOUNTER — Encounter: Payer: Self-pay | Admitting: Internal Medicine

## 2020-01-02 ENCOUNTER — Encounter: Payer: Self-pay | Admitting: Nurse Practitioner

## 2020-01-02 DIAGNOSIS — M810 Age-related osteoporosis without current pathological fracture: Secondary | ICD-10-CM

## 2020-01-02 DIAGNOSIS — E785 Hyperlipidemia, unspecified: Secondary | ICD-10-CM

## 2020-01-02 DIAGNOSIS — I10 Essential (primary) hypertension: Secondary | ICD-10-CM | POA: Diagnosis not present

## 2020-01-02 DIAGNOSIS — F0391 Unspecified dementia with behavioral disturbance: Secondary | ICD-10-CM

## 2020-01-02 DIAGNOSIS — I712 Thoracic aortic aneurysm, without rupture, unspecified: Secondary | ICD-10-CM

## 2020-01-02 NOTE — Progress Notes (Signed)
Provider:  Veleta Miners MD Location:    [facility] Room Number: 12 Place of Service:  ALF [phone])  PCP: Virgie Dad, MD Patient Care Team: Virgie Dad, MD as PCP - General (Internal Medicine) Minus Breeding, MD as Referring Physician (Cardiology) Tanda Rockers, MD as Referring Physician (Pulmonary Disease) Gatha Mayer, MD as Referring Physician (Gastroenterology) Gaynelle Arabian, MD as Referring Physician (Orthopedic Surgery) Melida Quitter, MD as Referring Physician Jackolyn Confer, MD (General Surgery)  Extended Emergency Contact Information Primary Emergency Contact: [facility] Address: [place], [place] [address] [name] of [place] Phone: [phone] Mobile Phone: [phone] Relation: Spouse Secondary Emergency Contact: Marcina Millard of [place] Phone: [phone] Mobile Phone: [phone] Relation: Son  Code Status: Full Code Goals of Care: Advanced Directive information Advanced Directives 01/02/2020  Does Patient Have a Medical Advance Directive? Yes  Type of Advance Directive Living will;Healthcare Power of Attorney  Does patient want to make changes to medical advance directive? No - Patient declined  Copy of [place] in Chart? Yes - validated most recent copy scanned in chart (See row information)  Would patient like information on creating a medical advance directive? -      Chief Complaint  Patient presents with  . New Admit To SNF    Admission    HPI: Patient is a 86 y.o. female seen today for admission to [facility] for Long term Care  Patient has a history of recurrent UTI, thoracic aneurysm 4.3 cm, history of gist tumor, moderate dementia with weight loss and unsteady gait.  Patient is admitted to [facility] as her husband was unable to take care of her in the [place] apartment Patient has history of dementia with recent progression. She was having behavior  issues mostly at night. She will get up many times at night to see if the kitchen has delivered food. She was moved 2 days ago and last Night  per nurses she was up all night going into other patient's room. Otherwise patient has adjusted to AL. Denied any complaints today. She walks with a cane is independent in her ADLs.  Past Medical History:  Diagnosis Date  . Anxiety   . Benign fundic gland polyps of stomach   . Benign gastrointestinal stromal tumor (GIST)   . Bronchitis   . Cancer ([place])    recent skin cancer left leg  excised about 8 days ago-remains with dressing intact.   . Chronic cystitis   . COLONIC POLYPS, HX OF   . Complication of anesthesia   . Diverticulosis   . DYSLIPIDEMIA   . Gastritis   . GERD (gastroesophageal reflux disease)   . Hemorrhoids   . Hiatal hernia   . HYPERTENSION   . Macular degeneration    optho q35mo - [facility]  . Meningioma ([place])   . MITRAL VALVE PROLAPSE   . NEPHROLITHIASIS   . OSTEOARTHRITIS   . OSTEOPOROSIS   . PONV (postoperative nausea and vomiting)   . Thoracic aortic aneurysm ([facility])    4.1 cm 2015   Past Surgical History:  Procedure Laterality Date  . ABDOMINAL HYSTERECTOMY    . APPENDECTOMY    . BREAST SURGERY    . [place]   no PCI, performed in [place]  . Cataract surgery  2000  . CHOLECYSTECTOMY    . COLONOSCOPY    . ESOPHAGOGASTRODUODENOSCOPY    .  EUS N/A 05/16/2014   Procedure: UPPER ENDOSCOPIC ULTRASOUND (EUS) LINEAR;  Surgeon: Milus Banister, MD;  Location: [facility];  Service: Endoscopy;  Laterality: N/A;  . INSERTION OF MESH  07/04/2012   Procedure: INSERTION OF MESH;  Surgeon: Odis Hollingshead, MD;  Location: [facility];  Service: General;  Laterality: N/A;  . KNEE SURGERY    . LEG SKIN LESION  BIOPSY / EXCISION Left    8 days ago pending pathology, remains with dressing.  . SURGERY OF LIP  in 2002  . TONSILLECTOMY    . VENTRAL HERNIA REPAIR  07/04/2012   Procedure: HERNIA REPAIR VENTRAL ADULT;   Surgeon: Odis Hollingshead, MD;  Location: [facility];  Service: General;  Laterality: N/A;    reports that she has never smoked. She has never used smokeless tobacco. She reports current alcohol use of about 6.0 standard drinks of alcohol per week. She reports that she does not use drugs. Social History   Socioeconomic History  . Marital status: Married    Spouse name: Not on file  . Number of children: 2  . Years of education: Not on file  . Highest education level: Not on file  Occupational History  . Occupation: retired  Tobacco Use  . Smoking status: Never Smoker  . Smokeless tobacco: Never Used  . Tobacco comment: Married, lives with spouse, Enjoys golf. Retired  Substance and Sexual Activity  . Alcohol use: Yes    Alcohol/week: 6.0 standard drinks    Types: 6 Standard drinks or equivalent per week    Comment: Wine -nightly  . Drug use: No  . Sexual activity: Not Currently  Other Topics Concern  . Not on file  Social History Narrative   Married, lives with spouse - enjoys golf   Lives at [place]?       Do you drink/eat things with caffeine? Yes 3 cups of coffee per day      Marital status?         Married            What year were you married? 1953       Do you live in a house, apartment, assisted living, condo, trailer, etc.? [facility] [place]      Is it one or more stories? 3       How many persons live in your home? 2       Do you have any pets in your home? (please list) no      Highest level of education completed? High school       Current or past profession: draftman [employer]      Do you exercise?                  yes                    Type & how often? Walking daily       Advanced Directives      Do you have a living will? yes      Do you have a DNR form?                                  If not, do you want to discuss one? yes      Do you have signed POA/HPOA for forms?  Yes       Functional Status      Do you have  difficulty bathing or dressing yourself? No       Do you have difficulty preparing food or eating?  No       Do you have difficulty managing your medications? No       Do you have difficulty managing your finances? No       Do you have difficulty affording your medications? No       Social Determinants of Health   Financial Resource Strain:   . Difficulty of Paying Living Expenses:   Food Insecurity:   . Worried About Charity fundraiser in the Last Year:   . Arboriculturist in the Last Year:   Transportation Needs:   . Film/video editor (Medical):   Marland Kitchen Lack of Transportation (Non-Medical):   Physical Activity:   . Days of Exercise per Week:   . Minutes of Exercise per Session:   Stress:   . Feeling of Stress :   Social Connections:   . Frequency of Communication with Friends and Family:   . Frequency of Social Gatherings with Friends and Family:   . Attends Religious Services:   . Active Member of Clubs or Organizations:   . Attends Archivist Meetings:   Marland Kitchen Marital Status:   Intimate Partner Violence:   . Fear of Current or Ex-Partner:   . Emotionally Abused:   Marland Kitchen Physically Abused:   . Sexually Abused:     Functional Status Survey:    Family History  Problem Relation Age of Onset  . Heart disease Other   . Heart disease Mother   . Heart disease Father   . Emphysema Father   . Heart attack Father   . Breast cancer Sister   . Lung cancer Sister   . Prostate cancer Son   . Prostate cancer Brother     Health Maintenance  Topic Date Due  . COVID-19 Vaccine (2 - Moderna 2-dose series) 10/08/2019  . INFLUENZA VACCINE  04/06/2020  . TETANUS/TDAP  06/21/2026  . DEXA SCAN  Completed  . PNA vac Low Risk Adult  Completed    Allergies  Allergen Reactions  . Pentothal [Thiopental] Nausea And Vomiting  . Codeine   . Levofloxacin   . Oxycodone-Acetaminophen   . Sulfonamide Derivatives     REACTION: GI upset/nausea/vomiting  . Tape Rash    Per  patient adhesive tape    Allergies as of 01/02/2020      Reactions   Pentothal [thiopental] Nausea And Vomiting   Codeine    Levofloxacin    Oxycodone-acetaminophen    Sulfonamide Derivatives    REACTION: GI upset/nausea/vomiting   Tape Rash   Per patient adhesive tape      Medication List       Accurate as of January 02, 2020 11:08 AM. If you have any questions, ask your nurse or doctor.        acetaminophen 500 MG tablet Commonly known as: TYLENOL Take 500 mg by mouth daily as needed.   esomeprazole 40 MG capsule Commonly known as: NEXIUM TAKE 1 CAPSULE BY MOUTH 30-60 MIN BEFORE YOUR FIRST AND LAST MEAL OF THE DAY   memantine 10 MG tablet Commonly known as: NAMENDA TAKE 1 TABLET BY MOUTH TWICE A DAY   simvastatin 5 MG tablet Commonly known as: ZOCOR TAKE 1 TABLET BY MOUTH EVERYDAY AT BEDTIME  Review of Systems  Review of Systems  Constitutional: Negative for activity change, appetite change, chills, diaphoresis, fatigue and fever.  HENT: Negative for mouth sores, postnasal drip, rhinorrhea, sinus pain and sore throat.   Respiratory: Negative for apnea, cough, chest tightness, shortness of breath and wheezing.   Cardiovascular: Negative for chest pain, palpitations and leg swelling.  Gastrointestinal: Negative for abdominal distention, abdominal pain, constipation, diarrhea, nausea and vomiting.  Genitourinary: Negative for dysuria and frequency.  Musculoskeletal: Negative for arthralgias, joint swelling and myalgias.  Skin: Negative for rash.  Neurological: Negative for dizziness, syncope, weakness, light-headedness and numbness.  Psychiatric/Behavioral: Negative for behavioral problems, confusion and sleep disturbance.     Vitals:   01/02/20 1038  BP: (!) 149/81  Pulse: 60  Resp: 16  Temp: (!) 97.5 F (36.4 C)  SpO2: 93%  Weight: 116 lb 6.4 oz (52.8 kg)  Height: 5' (1.524 m)   Body mass index is 22.73 kg/m. Physical Exam  Constitutional:   Well-developed and well-nourished.  HENT:  Head: Normocephalic.  Mouth/Throat: Oropharynx is clear and moist.  Eyes: Pupils are equal, round, and reactive to light.  Neck: Neck supple.  Cardiovascular: Normal rate and normal heart sounds.  No murmur heard. Pulmonary/Chest: Effort normal and breath sounds normal. No respiratory distress. No wheezes. She has no rales.  Abdominal: Soft. Bowel sounds are normal. No distension. There is no tenderness. There is no rebound.  Musculoskeletal: Mild Edema Bilateral Lymphadenopathy: none Neurological: No Focal Deficits Walks with Cane.  Skin: Skin is warm and dry.  Psychiatric: Normal mood and affect. Behavior is normal. Thought content normal.    Labs reviewed: Basic Metabolic Panel: Recent Labs    02/26/19 0805 08/27/19 0000  NA 141 140  K 3.9 4.1  CL 103 103  CO2 27 30  GLUCOSE 83 90  BUN 13 11  CREATININE 0.61 0.61  CALCIUM 9.9 9.3   Liver Function Tests: Recent Labs    02/26/19 0805 08/27/19 0000  AST 14 13  ALT 8 9  BILITOT 1.1 0.8  PROT 6.6 6.0*   No results for input(s): LIPASE, AMYLASE in the last 8760 hours. No results for input(s): AMMONIA in the last 8760 hours. CBC: Recent Labs    02/26/19 0805 08/27/19 0000  WBC 7.8 6.1  NEUTROABS 4,579 4,105  HGB 14.1 14.2  HCT 41.7 43.3  MCV 91.0 92.1  PLT 223 172   Cardiac Enzymes: No results for input(s): CKTOTAL, CKMB, CKMBINDEX, TROPONINI in the last 8760 hours. BNP: Invalid input(s): POCBNP No results found for: HGBA1C Lab Results  Component Value Date   TSH 1.56 02/26/2019   Lab Results  Component Value Date   VITAMINB12 320 08/27/2019   No results found for: FOLATE No results found for: IRON, TIBC, FERRITIN  Imaging and Procedures obtained prior to SNF admission: CT ANGIO NECK W OR WO CONTRAST  Result Date: 05/10/2018 CLINICAL DATA:  Bilateral carotid artery stenosis. EXAM: CT ANGIOGRAPHY NECK TECHNIQUE: Multidetector CT imaging of the neck was  performed using the standard protocol during bolus administration of intravenous contrast. Multiplanar CT image reconstructions and MIPs were obtained to evaluate the vascular anatomy. Carotid stenosis measurements (when applicable) are obtained utilizing NASCET criteria, using the distal internal carotid diameter as the denominator. CONTRAST:  73mL ISOVUE-370 IOPAMIDOL (ISOVUE-370) INJECTION 76% COMPARISON:  None. FINDINGS: Aortic arch: Standard 3 vessel aortic arch with mild calcified atherosclerosis. Widely patent brachiocephalic and subclavian arteries. Right carotid system: Patent without evidence of stenosis or dissection. Tortuous proximal  common carotid artery. Left carotid system: Patent with minimal atherosclerotic plaque at the carotid bifurcation. No evidence of stenosis or dissection. Tortuous proximal common carotid artery. Vertebral arteries: The vertebral arteries are patent without evidence of significant stenosis in the neck. The right vertebral artery is dominant. Right V4 segment calcified atherosclerosis does not result in significant stenosis. The left V4 segment is diminutive and not well evaluated distally. The basilar artery is developmentally small due to the presence of large bilateral posterior communicating arteries, incompletely visualized. Skeleton: Moderately severe cervical disc degeneration. Other neck: No evidence of acute abnormality or mass. Upper chest: Reported separately. CLINICAL DATA:  Bilateral carotid artery stenosis. EXAM: CT ANGIOGRAPHY NECK TECHNIQUE: Multidetector CT imaging of the neck was performed using the standard protocol during bolus administration of intravenous contrast. Multiplanar CT image reconstructions and MIPs were obtained to evaluate the vascular anatomy. Carotid stenosis measurements (when applicable) are obtained utilizing NASCET criteria, using the distal internal carotid diameter as the denominator. CONTRAST:  45mL ISOVUE-370 IOPAMIDOL (ISOVUE-370)  INJECTION 76% COMPARISON:  None. FINDINGS: Aortic arch: Standard 3 vessel aortic arch with mild calcified atherosclerosis. Widely patent brachiocephalic and subclavian arteries. Right carotid system: Patent without evidence of stenosis or dissection. Tortuous proximal common carotid artery. Left carotid system: Patent with minimal atherosclerotic plaque at the carotid bifurcation. No evidence of stenosis or dissection. Tortuous proximal common carotid artery. Vertebral arteries: The vertebral arteries are patent without evidence of significant stenosis in the neck. The right vertebral artery is dominant. Right V4 segment calcified atherosclerosis does not result in significant stenosis. The left V4 segment is diminutive and not well evaluated distally. The basilar artery is developmentally small due to the presence of large bilateral posterior communicating arteries, incompletely visualized. Skeleton: Moderately severe cervical disc degeneration. Other neck: No evidence of acute abnormality or mass. Upper chest: Reported separately. IMPRESSION: Patent carotid and vertebral arteries without significant stenosis in the neck. Electronically Signed   By: Logan Bores M.D.   On: 05/10/2018 16:13   CT ANGIO CHEST AORTA W &/OR WO CONTRAST  Result Date: 05/11/2018 CLINICAL DATA:  86 year old with thoracic aortic aneurysm. Biopsy proven esophageal GIST tumor. EXAM: CT ANGIOGRAPHY CHEST WITH CONTRAST TECHNIQUE: Multidetector CT imaging of the chest was performed using the standard protocol during bolus administration of intravenous contrast. Multiplanar CT image reconstructions and MIPs were obtained to evaluate the vascular anatomy. CONTRAST:  20mL ISOVUE-370 IOPAMIDOL (ISOVUE-370) INJECTION 76% COMPARISON:  05/16/2017 FINDINGS: Cardiovascular: Ascending thoracic aorta measures up to 4.3 cm and stable. Negative for aortic dissection. Great vessels are patent. The proximal common carotid arteries are tortuous. Tortuous  right subclavian artery. Proximal aortic arch measures 3.8 cm and stable. Proximal descending thoracic aorta measures 2.9 cm and stable. Main pulmonary arteries are patent. Cardiac enlargement without significant pericardial fluid. Celiac trunk and SMA are widely patent. Origin of the right renal artery is widely patent. Atherosclerotic calcifications in the thoracic and abdominal aorta. Mediastinum/Nodes: Again noted is a low attenuating lesion involving the mid/distal esophagus. This lesion measures 2.6 x 3.2 cm and previously measured 2.6 x 2.9 cm. This is compatible with the known GIST tumor. There is a second smaller low attenuating lesion near the posterior aspect of the GE junction that measures 1.8 x 1.5 cm and previously measured roughly 1.7 x 1.5 cm. This smaller lesion has not significantly changed. There is no evidence for esophageal dilatation. Thyroid tissue is unremarkable. No significant mediastinal or hilar lymphadenopathy. Lungs/Pleura: Trachea and mainstem bronchi are patent. Chronic scarring along  the lateral pleural surface of the right lower lobe. Mild scarring at the lung apices. Atelectasis at the left lung base. No large effusions. Upper Abdomen: Evidence for left renal cysts. There appears to be a persistent hyperdense structure or cyst in the left kidney upper pole. Musculoskeletal: Thoracic spine kyphosis with multilevel degenerative endplate disease. Review of the MIP images confirms the above findings. IMPRESSION: Aortic aneurysm NOS (ICD10-I71.9). Stable fusiform aneurysm of the ascending thoracic aorta measuring up to 4.3 cm. Recommend annual imaging followup by CTA or MRA. This recommendation follows 2010 ACCF/AHA/AATS/ACR/ASA/SCA/SCAI/SIR/STS/SVM Guidelines for the Diagnosis and Management of Patients with Thoracic Aortic Disease. Circulation. 2010; 121: [id number] Aortic Atherosclerosis (ICD10-I70.0). The GIST tumor in the posterior mediastinum has minimally changed in size. No  significant change in the smaller lesion along posterior aspect of the GE junction. Electronically Signed   By: Markus Daft M.D.   On: 05/11/2018 07:58    Assessment/Plan Essential hypertension Controlled on Micardis  Thoracic aortic aneurysm without rupture [facility]) Not Candidate for further Work up  Dementia with behavioral disturbance, unspecified dementia type ([place]) MMSE 19/30 Failed her Clock Drawing MRI in 2016 showedMild to moderate small vessel disease type changes. Global atrophy without hydrocephalus.Does have Meningoma Melatonin 3 mg Start her Seroquel PRN to help with her Behavior  Hyperlipidemia,  LDl Less then 100 Same dose of statin  Age-related osteoporosis without current pathological fracture Has been treated with Fosamax and Prolia before per notes from 2014 Will need Repeat DEXA GIST On Nexium Conservative Management Unsteady gait Using Cane now No Falls    Family/ staff Communication:   Labs/tests ordered: CBC,CMP  Total time spent in this patient care encounter was  45_  minutes; greater than 50% of the visit spent counseling patient and staff, reviewing records , Labs and coordinating care for problems addressed at this encounter.

## 2020-01-03 LAB — BASIC METABOLIC PANEL
BUN: 17 (ref 4–21)
CO2: 30 — AB (ref 13–22)
Chloride: 107 (ref 99–108)
Creatinine: 0.5 (ref 0.5–1.1)
Glucose: 81
Potassium: 3.6 (ref 3.4–5.3)
Sodium: 144 (ref 137–147)

## 2020-01-03 LAB — CBC AND DIFFERENTIAL
HCT: 41 (ref 36–46)
Hemoglobin: 13.8 (ref 12.0–16.0)
Neutrophils Absolute: 4592
Platelets: 175 (ref 150–399)
WBC: 7

## 2020-01-03 LAB — CBC: RBC: 4.51 (ref 3.87–5.11)

## 2020-01-03 LAB — HEPATIC FUNCTION PANEL
ALT: 11 (ref 7–35)
AST: 14 (ref 13–35)
Alkaline Phosphatase: 89 (ref 25–125)
Bilirubin, Total: 1

## 2020-01-03 LAB — COMPREHENSIVE METABOLIC PANEL
Albumin: 4.3 (ref 3.5–5.0)
Calcium: 9.5 (ref 8.7–10.7)
Globulin: 1.9

## 2020-01-21 ENCOUNTER — Encounter: Payer: Self-pay | Admitting: Nurse Practitioner

## 2020-01-21 ENCOUNTER — Non-Acute Institutional Stay: Payer: Medicare Other | Admitting: Nurse Practitioner

## 2020-01-21 DIAGNOSIS — M810 Age-related osteoporosis without current pathological fracture: Secondary | ICD-10-CM | POA: Diagnosis not present

## 2020-01-21 DIAGNOSIS — F0391 Unspecified dementia with behavioral disturbance: Secondary | ICD-10-CM

## 2020-01-21 DIAGNOSIS — G4700 Insomnia, unspecified: Secondary | ICD-10-CM | POA: Insufficient documentation

## 2020-01-21 NOTE — Progress Notes (Signed)
Location:   [facility] Room Number: 12 Place of Service:  ALF (13) Provider: Lennie Odor Mutasim Tuckey NP  Virgie Dad, MD  Patient Care Team: Virgie Dad, MD as PCP - General (Internal Medicine) Minus Breeding, MD as Referring Physician (Cardiology) Tanda Rockers, MD as Referring Physician (Pulmonary Disease) Gatha Mayer, MD as Referring Physician (Gastroenterology) Gaynelle Arabian, MD as Referring Physician (Orthopedic Surgery) Melida Quitter, MD as Referring Physician Jackolyn Confer, MD (General Surgery)  Extended Emergency Contact Information Primary Emergency Contact: [facility] Address: [place], [place] [address] [name] of [place] Phone: [phone] Mobile Phone: [phone] Relation: Spouse Secondary Emergency Contact: Marcina Millard of [place] Phone: [phone] Mobile Phone: [phone] Relation: Son  Code Status:  DNR Goals of care: Advanced Directive information Advanced Directives 01/02/2020  Does Patient Have a Medical Advance Directive? Yes  Type of Advance Directive Living will;Healthcare Power of Attorney  Does patient want to make changes to medical advance directive? No - Patient declined  Copy of [place] in Chart? Yes - validated most recent copy scanned in chart (See row information)  Would patient like information on creating a medical advance directive? -     Chief Complaint  Patient presents with  . Acute Visit    difficulty of redirection the patient's out of other resident's room often, aggressive behaviors.    HPI:  Pt is a 87 y.o. female seen today for an acute visit for not sleeping well, wandered into other resident's rooms at night,  difficulty redirecting-the patient became aggressive, also the patient wandered into loading dock with no safety awareness. Prn Seroquel 12.5mg  prn used 4x/last 2 weeks, effective. Melatonin was not adequate aiding her  sleep at night. The patient resides in [facility] for safety, care assistance, ambulates with walker.    Past Medical History:  Diagnosis Date  . Anxiety   . Benign fundic gland polyps of stomach   . Benign gastrointestinal stromal tumor (GIST)   . Bronchitis   . Cancer ([place])    recent skin cancer left leg  excised about 8 days ago-remains with dressing intact.   . Chronic cystitis   . COLONIC POLYPS, HX OF   . Complication of anesthesia   . Diverticulosis   . DYSLIPIDEMIA   . Gastritis   . GERD (gastroesophageal reflux disease)   . Hemorrhoids   . Hiatal hernia   . HYPERTENSION   . Macular degeneration    optho q2mo - Hecker  . Meningioma ([place])   . MITRAL VALVE PROLAPSE   . NEPHROLITHIASIS   . OSTEOARTHRITIS   . OSTEOPOROSIS   . PONV (postoperative nausea and vomiting)   . Thoracic aortic aneurysm (HCC)    4.1 cm 2015   Past Surgical History:  Procedure Laterality Date  . ABDOMINAL HYSTERECTOMY    . APPENDECTOMY    . BREAST SURGERY    . [place]   no PCI, performed in [place]  . Cataract surgery  2000  . CHOLECYSTECTOMY    . COLONOSCOPY    . ESOPHAGOGASTRODUODENOSCOPY    . EUS N/A 05/16/2014   Procedure: UPPER ENDOSCOPIC ULTRASOUND (EUS) LINEAR;  Surgeon: Milus Banister, MD;  Location: [facility];  Service: Endoscopy;  Laterality: N/A;  . INSERTION OF MESH  07/04/2012   Procedure: INSERTION OF MESH;  Surgeon: Odis Hollingshead, MD;  Location: [facility];  Service: General;  Laterality: N/A;  . KNEE SURGERY    . LEG SKIN LESION  BIOPSY / EXCISION Left    8 days ago pending pathology, remains with dressing.  . SURGERY OF LIP  in 2002  . TONSILLECTOMY    . VENTRAL HERNIA REPAIR  07/04/2012   Procedure: HERNIA REPAIR VENTRAL ADULT;  Surgeon: Odis Hollingshead, MD;  Location: [facility];  Service: General;  Laterality: N/A;    Allergies  Allergen Reactions  . Pentothal [Thiopental] Nausea And Vomiting  . Codeine   . Levofloxacin   .  Oxycodone-Acetaminophen   . Sulfonamide Derivatives     REACTION: GI upset/nausea/vomiting  . Tape Rash    Per patient adhesive tape    Allergies as of 01/21/2020      Reactions   Pentothal [thiopental] Nausea And Vomiting   Codeine    Levofloxacin    Oxycodone-acetaminophen    Sulfonamide Derivatives    REACTION: GI upset/nausea/vomiting   Tape Rash   Per patient adhesive tape      Medication List       Accurate as of Jan 21, 2020 11:59 PM. If you have any questions, ask your nurse or doctor.        acetaminophen 500 MG tablet Commonly known as: TYLENOL Take 500 mg by mouth daily as needed.   esomeprazole 40 MG capsule Commonly known as: NEXIUM TAKE 1 CAPSULE BY MOUTH 30-60 MIN BEFORE YOUR FIRST AND LAST MEAL OF THE DAY   melatonin 3 MG Tabs tablet Take 3 mg by mouth at bedtime.   memantine 10 MG tablet Commonly known as: NAMENDA TAKE 1 TABLET BY MOUTH TWICE A DAY   mirtazapine 7.5 MG tablet Commonly known as: REMERON Take 7.5 mg by mouth at bedtime.   QUEtiapine 12.5 mg Tabs tablet Commonly known as: SEROQUEL Take 12.5 mg by mouth at bedtime.   simvastatin 5 MG tablet Commonly known as: ZOCOR TAKE 1 TABLET BY MOUTH EVERYDAY AT BEDTIME       Review of Systems  Constitutional: Negative for activity change, appetite change, fatigue and fever.  HENT: Positive for hearing loss. Negative for congestion and voice change.   Eyes: Negative for visual disturbance.  Respiratory: Negative for cough, shortness of breath and wheezing.   Cardiovascular: Negative for leg swelling.  Gastrointestinal: Negative for abdominal distention, abdominal pain and constipation.  Genitourinary: Negative for difficulty urinating, dysuria and urgency.  Musculoskeletal: Negative for gait problem.  Skin: Negative for color change.  Neurological: Negative for speech difficulty, weakness, light-headedness and headaches.       Dementia  Psychiatric/Behavioral: Positive for agitation,  behavioral problems, confusion and sleep disturbance. Negative for hallucinations. The patient is not nervous/anxious.     Immunization History  Administered Date(s) Administered  . Influenza Split 06/07/2011, 06/13/2012, 06/15/2013  . Influenza Whole 06/06/2009, 05/18/2010  . Influenza, High Dose Seasonal PF 06/15/2013, 06/23/2017, 06/22/2018, 04/30/2019  . Influenza-Unspecified 06/07/2015, 05/28/2016  . Moderna SARS-COVID-2 Vaccination 09/10/2019  . Pneumococcal Conjugate-13 07/12/2014  . Pneumococcal Polysaccharide-23 09/07/2007  . Td 09/07/2007  . Tdap 06/21/2016   Pertinent  Health Maintenance Due  Topic Date Due  . INFLUENZA VACCINE  04/06/2020  . DEXA SCAN  Completed  . PNA vac Low Risk Adult  Completed   Fall Risk  10/02/2019 09/26/2019 06/20/2019 02/28/2019 02/14/2019  Falls in the past year? 0 0 1 1 1   Number falls in past yr: 0 0 0 0 0  Injury with Fall? - - 1 1 1   Comment - - - - -  Risk Factor Category  - - - - -  Risk for fall due to : - - - - -  Follow up - - - - -   Functional Status Survey:    Vitals:   01/21/20 1335  BP: 130/64  Pulse: 60  Resp: 20  Temp: (!) 97 F (36.1 C)  SpO2: 96%  Weight: 115 lb (52.2 kg)  Height: 5' (1.524 m)   Body mass index is 22.46 kg/m. Physical Exam Vitals and nursing note reviewed.  Constitutional:      Appearance: Normal appearance.  HENT:     Head: Normocephalic and atraumatic.     Mouth/Throat:     Mouth: Mucous membranes are moist.  Eyes:     Extraocular Movements: Extraocular movements intact.     Conjunctiva/sclera: Conjunctivae normal.     Pupils: Pupils are equal, round, and reactive to light.  Cardiovascular:     Rate and Rhythm: Normal rate and regular rhythm.     Heart sounds: Murmur present.  Pulmonary:     Breath sounds: Normal breath sounds. No wheezing, rhonchi or rales.  Abdominal:     General: Bowel sounds are normal.     Palpations: Abdomen is soft.     Tenderness: There is no abdominal  tenderness.  Musculoskeletal:     Cervical back: Normal range of motion and neck supple.     Right lower leg: No edema.     Left lower leg: No edema.  Skin:    General: Skin is warm and dry.  Neurological:     General: No focal deficit present.     Mental Status: She is alert. Mental status is at baseline.     Motor: No weakness.     Coordination: Coordination normal.     Gait: Gait normal.     Comments: Oriented to person  Psychiatric:        Mood and Affect: Mood normal.        Behavior: Behavior normal.     Labs reviewed: Recent Labs    02/26/19 0805 08/27/19 0000 01/03/20 0000  NA 141 140 144  K 3.9 4.1 3.6  CL 103 103 107  CO2 27 30 30*  GLUCOSE 83 90  --   BUN 13 11 17   CREATININE 0.61 0.61 0.5  CALCIUM 9.9 9.3 9.5   Recent Labs    02/26/19 0805 08/27/19 0000 01/03/20 0000  AST 14 13 14   ALT 8 9 11   ALKPHOS  --   --  89  BILITOT 1.1 0.8  --   PROT 6.6 6.0*  --   ALBUMIN  --   --  4.3   Recent Labs    02/26/19 0805 08/27/19 0000 01/03/20 0000  WBC 7.8 6.1 7.0  NEUTROABS 4,579 4,105 4,592  HGB 14.1 14.2 13.8  HCT 41.7 43.3 41  MCV 91.0 92.1  --   PLT 223 172 175   Lab Results  Component Value Date   TSH 1.56 02/26/2019   No results found for: HGBA1C Lab Results  Component Value Date   CHOL 194 08/27/2019   HDL 87 08/27/2019   LDLCALC 87 08/27/2019   LDLDIRECT 97.7 06/22/2013   TRIG 104 08/27/2019   CHOLHDL 2.2 08/27/2019    Significant Diagnostic Results in last 30 days:  No results found.  Assessment/Plan Insomnia Continue Melatonin, adding Mirtazapine 7.5mg  qsh to aid sleep, may reduce wandering at night.   Dementia ([place]) Continue AL [facility] for safety, care assistance, continue  Memantine for memory, continue prn Seroquel 12.5mg  qd x 14 days for aggressive behaviors.     Family/ staff Communication: plan of care reviewed with the patient and charge nurse.   Labs/tests ordered:  none  Time spend 40 minutes.

## 2020-01-21 NOTE — Assessment & Plan Note (Signed)
Continue AL [facility] for safety, care assistance, continue Memantine for memory, continue prn Seroquel 12.5mg  qd x 14 days for aggressive behaviors.

## 2020-01-21 NOTE — Assessment & Plan Note (Signed)
Continue Melatonin, adding Mirtazapine 7.5mg  qsh to aid sleep, may reduce wandering at night.

## 2020-01-22 ENCOUNTER — Encounter: Payer: Self-pay | Admitting: Nurse Practitioner

## 2020-01-30 ENCOUNTER — Encounter: Payer: Self-pay | Admitting: Internal Medicine

## 2020-01-30 ENCOUNTER — Non-Acute Institutional Stay: Payer: Medicare Other | Admitting: Internal Medicine

## 2020-01-30 DIAGNOSIS — W19XXXA Unspecified fall, initial encounter: Secondary | ICD-10-CM | POA: Diagnosis not present

## 2020-01-30 DIAGNOSIS — E785 Hyperlipidemia, unspecified: Secondary | ICD-10-CM | POA: Diagnosis not present

## 2020-01-30 DIAGNOSIS — I10 Essential (primary) hypertension: Secondary | ICD-10-CM | POA: Diagnosis not present

## 2020-01-30 DIAGNOSIS — F0391 Unspecified dementia with behavioral disturbance: Secondary | ICD-10-CM | POA: Diagnosis not present

## 2020-01-30 NOTE — Progress Notes (Signed)
Location:    [facility] Room Number: 12 Place of Service:  ALF [phone]) Provider:  Veleta Miners MD   Virgie Dad, MD  Patient Care Team: Virgie Dad, MD as PCP - General (Internal Medicine) Minus Breeding, MD as Referring Physician (Cardiology) Tanda Rockers, MD as Referring Physician (Pulmonary Disease) Gatha Mayer, MD as Referring Physician (Gastroenterology) Gaynelle Arabian, MD as Referring Physician (Orthopedic Surgery) Melida Quitter, MD as Referring Physician Jackolyn Confer, MD (General Surgery)  Extended Emergency Contact Information Primary Emergency Contact: [facility] Address: [place], [place] [address] [name] of [place] Phone: [phone] Mobile Phone: [phone] Relation: Spouse Secondary Emergency Contact: Marcina Millard of [place] Phone: [phone] Mobile Phone: [phone] Relation: Son  Code Status:  DNR Goals of care: Advanced Directive information Advanced Directives 01/31/2020  Does Patient Have a Medical Advance Directive? Yes  Type of Paramedic of [place];Out of facility DNR (pink MOST or yellow form)  Does patient want to make changes to medical advance directive? No - Patient declined  Copy of [place] in Chart? Yes - validated most recent copy scanned in chart (See row information)  Would patient like information on creating a medical advance directive? -  Pre-existing out of facility DNR order (yellow form or pink MOST form) Yellow form placed in chart (order not valid for inpatient use)     Chief Complaint  Patient presents with  . Acute Visit    Fall    HPI:  Pt is a 86 y.o. female seen today for an acute visit for Fall with hematoma  Patient has a history of recurrent UTI, thoracic aneurysm 4.3 cm, history of gist tumor, moderate dementia with weight loss and unsteady gait. [place]  [place] was having behavior issues and husband not able to take care of her.  Patient continues to have behavior issues in the facility.  She continues to stay up at night and goes to at the patient's name.  Was  on as needed Seroquel Today she was trying to get coffee in the lunchroom and fell.  She had a hematoma on her head no bleed no laceration Complaining of mild headache has no focal deficit. Her husband wants her to be monitored in the facility     Past Medical History:  Diagnosis Date  . Anxiety   . Benign fundic gland polyps of stomach   . Benign gastrointestinal stromal tumor (GIST)   . Bronchitis   . Cancer ([place])    recent skin cancer left leg  excised about 8 days ago-remains with dressing intact.   . Chronic cystitis   . COLONIC POLYPS, HX OF   . Complication of anesthesia   . Diverticulosis   . DYSLIPIDEMIA   . Gastritis   . GERD (gastroesophageal reflux disease)   . Hemorrhoids   . Hiatal hernia   . HYPERTENSION   . Macular degeneration    optho q96mo - Hecker  . Meningioma ([place])   . MITRAL VALVE PROLAPSE   . NEPHROLITHIASIS   . OSTEOARTHRITIS   . OSTEOPOROSIS   . PONV (postoperative nausea and vomiting)   . Thoracic aortic aneurysm (HCC)    4.1 cm 2015   Past Surgical History:  Procedure Laterality Date  . ABDOMINAL HYSTERECTOMY    . APPENDECTOMY    . BREAST SURGERY    .  [place]   no PCI, performed in [place]  . Cataract surgery  2000  . CHOLECYSTECTOMY    . COLONOSCOPY    . ESOPHAGOGASTRODUODENOSCOPY    . EUS N/A 05/16/2014   Procedure: UPPER ENDOSCOPIC ULTRASOUND (EUS) LINEAR;  Surgeon: Milus Banister, MD;  [facility]: [facility];  Service: Endoscopy;  Laterality: N/A;  . INSERTION OF MESH  07/04/2012   Procedure: INSERTION OF MESH;  Surgeon: Odis Hollingshead, MD;  Location: [facility];  Service: General;  Laterality: N/A;  . KNEE SURGERY    . LEG SKIN LESION  BIOPSY / EXCISION Left    8 days ago pending pathology, remains  with dressing.  . SURGERY OF LIP  in 2002  . TONSILLECTOMY    . VENTRAL HERNIA REPAIR  07/04/2012   Procedure: HERNIA REPAIR VENTRAL ADULT;  Surgeon: Odis Hollingshead, MD;  Location: [facility];  Service: General;  Laterality: N/A;    Allergies  Allergen Reactions  . Pentothal [Thiopental] Nausea And Vomiting  . Codeine   . Levofloxacin   . Oxycodone-Acetaminophen   . Sulfonamide Derivatives     REACTION: GI upset/nausea/vomiting  . Tape Rash    Per patient adhesive tape    Allergies as of 01/30/2020      Reactions   Pentothal [thiopental] Nausea And Vomiting   Codeine    Levofloxacin    Oxycodone-acetaminophen    Sulfonamide Derivatives    REACTION: GI upset/nausea/vomiting   Tape Rash   Per patient adhesive tape      Medication List       Accurate as of Jan 30, 2020 11:59 PM. If you have any questions, ask your nurse or doctor.        acetaminophen 500 MG tablet Commonly known as: TYLENOL Take 500 mg by mouth daily as needed.   esomeprazole 40 MG capsule Commonly known as: NEXIUM TAKE 1 CAPSULE BY MOUTH 30-60 MIN BEFORE YOUR FIRST AND LAST MEAL OF THE DAY   melatonin 5 MG Tabs Take 5 mg by mouth at bedtime.   memantine 10 MG tablet Commonly known as: NAMENDA TAKE 1 TABLET BY MOUTH TWICE A DAY   mirtazapine 7.5 MG tablet Commonly known as: REMERON Take 7.5 mg by mouth at bedtime.   QUEtiapine 12.5 mg Tabs tablet Commonly known as: SEROQUEL Take 12.5 mg by mouth at bedtime as needed.   simvastatin 5 MG tablet Commonly known as: ZOCOR TAKE 1 TABLET BY MOUTH EVERYDAY AT BEDTIME       Review of Systems  Constitutional: Negative.   HENT: Negative.   Respiratory: Negative.   Cardiovascular: Negative.   Gastrointestinal: Negative.   Genitourinary: Negative.   Musculoskeletal: Positive for gait problem.  Skin: Negative.   Neurological: Positive for headaches.  Psychiatric/Behavioral: Positive for agitation, behavioral problems, confusion and sleep  disturbance.    Immunization History  Administered Date(s) Administered  . Influenza Split 06/07/2011, 06/13/2012, 06/15/2013  . Influenza Whole 06/06/2009, 05/18/2010  . Influenza, High Dose Seasonal PF 06/15/2013, 06/23/2017, 06/22/2018, 04/30/2019  . Influenza-Unspecified 06/07/2015, 05/28/2016  . Moderna SARS-COVID-2 Vaccination 09/10/2019  . Pneumococcal Conjugate-13 07/12/2014  . Pneumococcal Polysaccharide-23 09/07/2007  . Td 09/07/2007  . Tdap 06/21/2016   Pertinent  Health Maintenance Due  Topic Date Due  . INFLUENZA VACCINE  04/06/2020  . DEXA SCAN  Completed  . PNA vac Low Risk Adult  Completed   Fall Risk  10/02/2019 09/26/2019 06/20/2019 02/28/2019 02/14/2019  Falls in the past year? 0 0 1  1 1  Number falls in past yr: 0 0 0 0 0  Injury with Fall? - - 1 1 1   Comment - - - - -  Risk Factor Category  - - - - -  Risk for fall due to : - - - - -  Follow up - - - - -   Functional Status Survey:    Vitals:   01/30/20 1238  BP: (!) 148/92  Pulse: 61  Resp: 16  Temp: (!) 97 F (36.1 C)  SpO2: 96%  Weight: 119 lb 3.2 oz (54.1 kg)  Height: 5' (1.524 m)   Body mass index is 23.28 kg/m. Physical Exam  Constitutional: . Well-developed and well-nourished.  HENT:  Head: Normocephalic.  Mouth/Throat: Oropharynx is clear and moist.  Eyes: Pupils are equal, round, and reactive to light.  Neck: Neck supple.  Cardiovascular: Normal rate and normal heart sounds.  No murmur heard. Pulmonary/Chest: Effort normal and breath sounds normal. No respiratory distress. No wheezes. She has no rales.  Abdominal: Soft. Bowel sounds are normal. No distension. There is no tenderness. There is no rebound.  Musculoskeletal: Mild edema Bilateral Lymphadenopathy: none Neurological:  No Focal Deficits Skin: Skin is warm and dry.  Psychiatric: Normal mood and affect. Behavior is normal. Thought content normal.    Labs reviewed: Recent Labs    02/26/19 0805 08/27/19 0000  01/03/20 0000  NA 141 140 144  K 3.9 4.1 3.6  CL 103 103 107  CO2 27 30 30*  GLUCOSE 83 90  --   BUN 13 11 17   CREATININE 0.61 0.61 0.5  CALCIUM 9.9 9.3 9.5   Recent Labs    02/26/19 0805 08/27/19 0000 01/03/20 0000  AST 14 13 14   ALT 8 9 11   ALKPHOS  --   --  89  BILITOT 1.1 0.8  --   PROT 6.6 6.0*  --   ALBUMIN  --   --  4.3   Recent Labs    02/26/19 0805 08/27/19 0000 01/03/20 0000  WBC 7.8 6.1 7.0  NEUTROABS 4,579 4,105 4,592  HGB 14.1 14.2 13.8  HCT 41.7 43.3 41  MCV 91.0 92.1  --   PLT 223 172 175   Lab Results  Component Value Date   TSH 1.56 02/26/2019   No results found for: HGBA1C Lab Results  Component Value Date   CHOL 194 08/27/2019   HDL 87 08/27/2019   LDLCALC 87 08/27/2019   LDLDIRECT 97.7 06/22/2013   TRIG 104 08/27/2019   CHOLHDL 2.2 08/27/2019    Significant Diagnostic Results in last 30 days:  No results found.  Assessment/Plan Fall, initial encounter Neuro Check in facility Patient walking with no iisues  Dementia with behavioral disturbance, unspecified dementia type ([place]) Continues to be Issue She is not sleeping at night and trying to leave the facility Are going to try Melatonin 5mg  On Namenda  Her MMSE is 19/30Failed her Clock Drawing MRI in 2016 showedMild to moderate small vessel disease type changes. Global atrophy without hydrocephalus.Does haveMeningoma   Essential hypertension On Micardis Hyperlipidemia, unspecified hyperlipidemia type Zocor   Family/ staff Communication:   Labs/tests ordered:

## 2020-01-31 ENCOUNTER — Non-Acute Institutional Stay: Payer: Medicare Other | Admitting: Internal Medicine

## 2020-01-31 ENCOUNTER — Encounter: Payer: Self-pay | Admitting: Internal Medicine

## 2020-01-31 DIAGNOSIS — F0391 Unspecified dementia with behavioral disturbance: Secondary | ICD-10-CM | POA: Diagnosis not present

## 2020-01-31 DIAGNOSIS — G4700 Insomnia, unspecified: Secondary | ICD-10-CM

## 2020-01-31 DIAGNOSIS — I10 Essential (primary) hypertension: Secondary | ICD-10-CM

## 2020-01-31 DIAGNOSIS — R001 Bradycardia, unspecified: Secondary | ICD-10-CM

## 2020-01-31 DIAGNOSIS — E785 Hyperlipidemia, unspecified: Secondary | ICD-10-CM

## 2020-01-31 NOTE — Progress Notes (Signed)
Location:    [facility] Room Number: 12 Place of Service:  [phone]) Provider:  Veleta Miners MD   Virgie Dad, MD  Patient Care Team: Virgie Dad, MD as PCP - General (Internal Medicine) Minus Breeding, MD as Referring Physician (Cardiology) Tanda Rockers, MD as Referring Physician (Pulmonary Disease) Gatha Mayer, MD as Referring Physician (Gastroenterology) Gaynelle Arabian, MD as Referring Physician (Orthopedic Surgery) Melida Quitter, MD as Referring Physician Jackolyn Confer, MD (General Surgery)  Extended Emergency Contact Information Primary Emergency Contact: [facility] Address: [address], [place] [address] [name] of [place] Phone: [phone] Mobile Phone: [phone] Relation: Spouse Secondary Emergency Contact: Marcina Millard of [place] Phone: [phone] Mobile Phone: [phone] Relation: Son  Code Status:  DNR Goals of care: Advanced Directive information Advanced Directives 01/31/2020  Does Patient Have a Medical Advance Directive? Yes  Type of Paramedic of [place];Out of facility DNR (pink MOST or yellow form)  Does patient want to make changes to medical advance directive? No - Patient declined  Copy of [place] in Chart? Yes - validated most recent copy scanned in chart (See row information)  Would patient like information on creating a medical advance directive? -  Pre-existing out of facility DNR order (yellow form or pink MOST form) Yellow form placed in chart (order not valid for inpatient use)     Chief Complaint  Patient presents with  . Acute Visit    Behavior issues    HPI:  Pt is a 86 y.o. female seen today for an acute visit for Behavior Issues  Patient has a history of recurrent UTI, thoracic aneurysm 4.3 cm, history of gist tumor, moderate dementia with weight loss and unsteady gait.  Patient is  admitted to [facility] as her husband was unable to take care of her in the [place] apartment Patient has history of dementia with recent progression. She was having behavior issues mostly at night. She will get up many times at night to see if the kitchen has delivered food. She was moved few days ago. She has been having behavior issues at night including trying to leave the facility.  But yesterday night patient went to the other residents room trying to get stuff from her closet.  The other resident got very upset. Nurses have talked to the family and they are now more agreeable to start her on standing dose of Seroquel. Patient unable to give any history does not remember any issues last night.  Sitting comfortably in her room.  Her husband is helping her to adjust.  Past Medical History:  Diagnosis Date  . Anxiety   . Benign fundic gland polyps of stomach   . Benign gastrointestinal stromal tumor (GIST)   . Bronchitis   . Cancer ([facility])    recent skin cancer left leg  excised about 8 days ago-remains with dressing intact.   . Chronic cystitis   . COLONIC POLYPS, HX OF   . Complication of anesthesia   . Diverticulosis   . DYSLIPIDEMIA   . Gastritis   . GERD (gastroesophageal reflux disease)   . Hemorrhoids   . Hiatal hernia   . HYPERTENSION   . Macular degeneration    optho q57mo - [facility]  . Meningioma ([facility])   . MITRAL VALVE PROLAPSE   . NEPHROLITHIASIS   . OSTEOARTHRITIS   . OSTEOPOROSIS   .  PONV (postoperative nausea and vomiting)   . Thoracic aortic aneurysm (HCC)    4.1 cm 2015   Past Surgical History:  Procedure Laterality Date  . ABDOMINAL HYSTERECTOMY    . APPENDECTOMY    . BREAST SURGERY    . [place]   no PCI, performed in [place]  . Cataract surgery  2000  . CHOLECYSTECTOMY    . COLONOSCOPY    . ESOPHAGOGASTRODUODENOSCOPY    . EUS N/A 05/16/2014   Procedure: UPPER ENDOSCOPIC ULTRASOUND (EUS) LINEAR;  Surgeon: Milus Banister, MD;  Location: [facility]  ENDOSCOPY;  Service: Endoscopy;  Laterality: N/A;  . INSERTION OF MESH  07/04/2012   Procedure: INSERTION OF MESH;  Surgeon: Odis Hollingshead, MD;  Location: [facility];  Service: General;  Laterality: N/A;  . KNEE SURGERY    . LEG SKIN LESION  BIOPSY / EXCISION Left    8 days ago pending pathology, remains with dressing.  . SURGERY OF LIP  in 2002  . TONSILLECTOMY    . VENTRAL HERNIA REPAIR  07/04/2012   Procedure: HERNIA REPAIR VENTRAL ADULT;  Surgeon: Odis Hollingshead, MD;  Location: [facility];  Service: General;  Laterality: N/A;    Allergies  Allergen Reactions  . Pentothal [Thiopental] Nausea And Vomiting  . Codeine   . Levofloxacin   . Oxycodone-Acetaminophen   . Sulfonamide Derivatives     REACTION: GI upset/nausea/vomiting  . Tape Rash    Per patient adhesive tape    Allergies as of 01/31/2020      Reactions   Pentothal [thiopental] Nausea And Vomiting   Codeine    Levofloxacin    Oxycodone-acetaminophen    Sulfonamide Derivatives    REACTION: GI upset/nausea/vomiting   Tape Rash   Per patient adhesive tape      Medication List       Accurate as of Jan 31, 2020 11:59 PM. If you have any questions, ask your nurse or doctor.        acetaminophen 500 MG tablet Commonly known as: TYLENOL Take 500 mg by mouth daily as needed.   esomeprazole 40 MG capsule Commonly known as: NEXIUM TAKE 1 CAPSULE BY MOUTH 30-60 MIN BEFORE YOUR FIRST AND LAST MEAL OF THE DAY   melatonin 5 MG Tabs Take 5 mg by mouth at bedtime.   memantine 10 MG tablet Commonly known as: NAMENDA TAKE 1 TABLET BY MOUTH TWICE A DAY   mirtazapine 7.5 MG tablet Commonly known as: REMERON Take 7.5 mg by mouth at bedtime.   QUEtiapine 12.5 mg Tabs tablet Commonly known as: SEROQUEL Take 12.5 mg by mouth at bedtime as needed.   simvastatin 5 MG tablet Commonly known as: ZOCOR TAKE 1 TABLET BY MOUTH EVERYDAY AT BEDTIME       Review of Systems  Review of Systems  Constitutional: Negative for  activity change, appetite change, chills, diaphoresis, fatigue and fever.  HENT: Negative for mouth sores, postnasal drip, rhinorrhea, sinus pain and sore throat.   Respiratory: Negative for apnea, cough, chest tightness, shortness of breath and wheezing.   Cardiovascular: Negative for chest pain, palpitations and leg swelling.  Gastrointestinal: Negative for abdominal distention, abdominal pain, constipation, diarrhea, nausea and vomiting.  Genitourinary: Negative for dysuria and frequency.  Musculoskeletal: Negative for arthralgias, joint swelling and myalgias.  Skin: Negative for rash.  Neurological: Negative for dizziness, syncope, weakness, light-headedness and numbness.  Psychiatric/Behavioral: Negative for behavioral problems, confusion and sleep disturbance.     Immunization History  Administered Date(s) Administered  . Influenza Split 06/07/2011, 06/13/2012, 06/15/2013  . Influenza Whole 06/06/2009, 05/18/2010  . Influenza, High Dose Seasonal PF 06/15/2013, 06/23/2017, 06/22/2018, 04/30/2019  . Influenza-Unspecified 06/07/2015, 05/28/2016  . Moderna SARS-COVID-2 Vaccination 09/10/2019  . Pneumococcal Conjugate-13 07/12/2014  . Pneumococcal Polysaccharide-23 09/07/2007  . Td 09/07/2007  . Tdap 06/21/2016   Pertinent  Health Maintenance Due  Topic Date Due  . INFLUENZA VACCINE  04/06/2020  . DEXA SCAN  Completed  . PNA vac Low Risk Adult  Completed   Fall Risk  10/02/2019 09/26/2019 06/20/2019 02/28/2019 02/14/2019  Falls in the past year? 0 0 1 1 1   Number falls in past yr: 0 0 0 0 0  Injury with Fall? - - 1 1 1   Comment - - - - -  Risk Factor Category  - - - - -  Risk for fall due to : - - - - -  Follow up - - - - -   Functional Status Survey:    Vitals:   01/31/20 1034  BP: 120/60  Pulse: 62  Resp: 18  Temp: (!) 97 F (36.1 C)  SpO2: 96%  Weight: 119 lb 3.2 oz (54.1 kg)  Height: 5' (1.524 m)   Body mass index is 23.28 kg/m. Physical Exam  Constitutional:  . Well-developed and well-nourished.  HENT:  Head: Normocephalic.  Mouth/Throat: Oropharynx is clear and moist.  Eyes: Pupils are equal, round, and reactive to light.  Neck: Neck supple.  Cardiovascular: Normal rate and normal heart sounds.  No murmur heard. Pulmonary/Chest: Effort normal and breath sounds normal. No respiratory distress. No wheezes. She has no rales.  Abdominal: Soft. Bowel sounds are normal. No distension. There is no tenderness. There is no rebound.  Musculoskeletal:Mild edema Bilateral Lymphadenopathy: none Neurological: Walking with her cane with no issues.  Skin: Skin is warm and dry.  Psychiatric: Normal mood and affect. Behavior is normal. Thought content normal.    Labs reviewed: Recent Labs    02/26/19 0805 08/27/19 0000 01/03/20 0000  NA 141 140 144  K 3.9 4.1 3.6  CL 103 103 107  CO2 27 30 30*  GLUCOSE 83 90  --   BUN 13 11 17   CREATININE 0.61 0.61 0.5  CALCIUM 9.9 9.3 9.5   Recent Labs    02/26/19 0805 08/27/19 0000 01/03/20 0000  AST 14 13 14   ALT 8 9 11   ALKPHOS  --   --  89  BILITOT 1.1 0.8  --   PROT 6.6 6.0*  --   ALBUMIN  --   --  4.3   Recent Labs    02/26/19 0805 08/27/19 0000 01/03/20 0000  WBC 7.8 6.1 7.0  NEUTROABS 4,579 4,105 4,592  HGB 14.1 14.2 13.8  HCT 41.7 43.3 41  MCV 91.0 92.1  --   PLT 223 172 175   Lab Results  Component Value Date   TSH 1.56 02/26/2019   No results found for: HGBA1C Lab Results  Component Value Date   CHOL 194 08/27/2019   HDL 87 08/27/2019   LDLCALC 87 08/27/2019   LDLDIRECT 97.7 06/22/2013   TRIG 104 08/27/2019   CHOLHDL 2.2 08/27/2019    Significant Diagnostic Results in last 30 days:  No results found.  Assessment/Plan  Dementia with behavioral disturbance, unspecified dementia type (HCC) Seroquel 12.5 mg QHS Start on Depakote 125 mg BID Repeat Labs in 4 weeks Family is trying to provide Sitters at night Also Seroquel 12.5 mg Prn if  needed ? Bradycardia EKG done  in facility showed First degree Bock with HR of 65 Depression with Insomnia Also have patient on Remeron and Melatonin  Hyperlipidemia, unspecified hyperlipidemia type On Zocor  Essential hypertension On Micardis   Family/ staff Communication:   Labs/tests ordered:

## 2020-02-03 ENCOUNTER — Encounter: Payer: Self-pay | Admitting: Internal Medicine

## 2020-02-27 NOTE — Addendum Note (Signed)
Addended by: Georgina Snell on: 02/27/2020 09:58 AM   Modules accepted: Level of Service

## 2020-03-19 ENCOUNTER — Encounter: Payer: Medicare Other | Admitting: Internal Medicine

## 2020-03-23 ENCOUNTER — Other Ambulatory Visit: Payer: Self-pay

## 2020-03-23 ENCOUNTER — Emergency Department (HOSPITAL_COMMUNITY): Payer: Medicare Other

## 2020-03-23 ENCOUNTER — Inpatient Hospital Stay (HOSPITAL_COMMUNITY): Payer: Medicare Other | Admitting: Anesthesiology

## 2020-03-23 ENCOUNTER — Inpatient Hospital Stay (HOSPITAL_COMMUNITY)
Admission: EM | Admit: 2020-03-23 | Discharge: 2020-03-29 | DRG: 481 | Disposition: A | Discharge status: Skilled Nursing Facility | Payer: Medicare Other | Source: Skilled Nursing Facility | Attending: Internal Medicine | Admitting: Internal Medicine

## 2020-03-23 ENCOUNTER — Inpatient Hospital Stay (HOSPITAL_COMMUNITY): Payer: Medicare Other

## 2020-03-23 ENCOUNTER — Encounter (HOSPITAL_COMMUNITY): Payer: Self-pay | Admitting: Emergency Medicine

## 2020-03-23 ENCOUNTER — Encounter (HOSPITAL_COMMUNITY)
Admission: EM | Disposition: A | Discharge status: Skilled Nursing Facility | Payer: Self-pay | Source: Skilled Nursing Facility | Attending: Internal Medicine

## 2020-03-23 DIAGNOSIS — K219 Gastro-esophageal reflux disease without esophagitis: Secondary | ICD-10-CM | POA: Diagnosis present

## 2020-03-23 DIAGNOSIS — Z79899 Other long term (current) drug therapy: Secondary | ICD-10-CM | POA: Diagnosis not present

## 2020-03-23 DIAGNOSIS — I340 Nonrheumatic mitral (valve) insufficiency: Secondary | ICD-10-CM | POA: Diagnosis not present

## 2020-03-23 DIAGNOSIS — Z79890 Hormone replacement therapy (postmenopausal): Secondary | ICD-10-CM | POA: Diagnosis not present

## 2020-03-23 DIAGNOSIS — R600 Localized edema: Secondary | ICD-10-CM | POA: Diagnosis present

## 2020-03-23 DIAGNOSIS — Z803 Family history of malignant neoplasm of breast: Secondary | ICD-10-CM | POA: Diagnosis not present

## 2020-03-23 DIAGNOSIS — I351 Nonrheumatic aortic (valve) insufficiency: Secondary | ICD-10-CM | POA: Diagnosis not present

## 2020-03-23 DIAGNOSIS — I10 Essential (primary) hypertension: Secondary | ICD-10-CM | POA: Diagnosis present

## 2020-03-23 DIAGNOSIS — F0390 Unspecified dementia without behavioral disturbance: Secondary | ICD-10-CM | POA: Diagnosis present

## 2020-03-23 DIAGNOSIS — Z825 Family history of asthma and other chronic lower respiratory diseases: Secondary | ICD-10-CM | POA: Diagnosis not present

## 2020-03-23 DIAGNOSIS — Z20822 Contact with and (suspected) exposure to covid-19: Secondary | ICD-10-CM | POA: Diagnosis present

## 2020-03-23 DIAGNOSIS — I361 Nonrheumatic tricuspid (valve) insufficiency: Secondary | ICD-10-CM | POA: Diagnosis not present

## 2020-03-23 DIAGNOSIS — D62 Acute posthemorrhagic anemia: Secondary | ICD-10-CM | POA: Diagnosis not present

## 2020-03-23 DIAGNOSIS — R001 Bradycardia, unspecified: Secondary | ICD-10-CM | POA: Diagnosis present

## 2020-03-23 DIAGNOSIS — W010XXA Fall on same level from slipping, tripping and stumbling without subsequent striking against object, initial encounter: Secondary | ICD-10-CM | POA: Diagnosis present

## 2020-03-23 DIAGNOSIS — H3530 Unspecified macular degeneration: Secondary | ICD-10-CM | POA: Diagnosis present

## 2020-03-23 DIAGNOSIS — S72002A Fracture of unspecified part of neck of left femur, initial encounter for closed fracture: Secondary | ICD-10-CM

## 2020-03-23 DIAGNOSIS — I719 Aortic aneurysm of unspecified site, without rupture: Secondary | ICD-10-CM | POA: Diagnosis present

## 2020-03-23 DIAGNOSIS — S72142A Displaced intertrochanteric fracture of left femur, initial encounter for closed fracture: Principal | ICD-10-CM | POA: Diagnosis present

## 2020-03-23 DIAGNOSIS — R269 Unspecified abnormalities of gait and mobility: Secondary | ICD-10-CM | POA: Diagnosis present

## 2020-03-23 DIAGNOSIS — Z66 Do not resuscitate: Secondary | ICD-10-CM | POA: Diagnosis present

## 2020-03-23 DIAGNOSIS — Z8249 Family history of ischemic heart disease and other diseases of the circulatory system: Secondary | ICD-10-CM | POA: Diagnosis not present

## 2020-03-23 DIAGNOSIS — I4891 Unspecified atrial fibrillation: Secondary | ICD-10-CM | POA: Diagnosis present

## 2020-03-23 DIAGNOSIS — Z86011 Personal history of benign neoplasm of the brain: Secondary | ICD-10-CM | POA: Diagnosis not present

## 2020-03-23 DIAGNOSIS — Z801 Family history of malignant neoplasm of trachea, bronchus and lung: Secondary | ICD-10-CM

## 2020-03-23 DIAGNOSIS — S72009A Fracture of unspecified part of neck of unspecified femur, initial encounter for closed fracture: Secondary | ICD-10-CM | POA: Diagnosis present

## 2020-03-23 DIAGNOSIS — E785 Hyperlipidemia, unspecified: Secondary | ICD-10-CM | POA: Diagnosis present

## 2020-03-23 DIAGNOSIS — D214 Benign neoplasm of connective and other soft tissue of abdomen: Secondary | ICD-10-CM | POA: Diagnosis present

## 2020-03-23 DIAGNOSIS — Z8042 Family history of malignant neoplasm of prostate: Secondary | ICD-10-CM | POA: Diagnosis not present

## 2020-03-23 DIAGNOSIS — S72001A Fracture of unspecified part of neck of right femur, initial encounter for closed fracture: Secondary | ICD-10-CM | POA: Diagnosis not present

## 2020-03-23 HISTORY — PX: INTRAMEDULLARY (IM) NAIL INTERTROCHANTERIC: SHX5875

## 2020-03-23 LAB — BASIC METABOLIC PANEL
Anion gap: 8 (ref 5–15)
BUN: 15 mg/dL (ref 8–23)
CO2: 25 mmol/L (ref 22–32)
Calcium: 8.9 mg/dL (ref 8.9–10.3)
Chloride: 108 mmol/L (ref 98–111)
Creatinine, Ser: 0.68 mg/dL (ref 0.44–1.00)
GFR calc Af Amer: >60 mL/min (ref >60)
GFR calc non Af Amer: >60 mL/min (ref >60)
Glucose, Bld: 130 mg/dL — ABNORMAL HIGH (ref 70–99)
Potassium: 3.9 mmol/L (ref 3.5–5.1)
Sodium: 141 mmol/L (ref 135–145)

## 2020-03-23 LAB — TSH: TSH: 1.998 u[IU]/mL (ref 0.350–4.500)

## 2020-03-23 LAB — CBC WITH DIFFERENTIAL/PLATELET
Abs Immature Granulocytes: 0.04 10*3/uL (ref 0.00–0.07)
Basophils Absolute: 0 10*3/uL (ref 0.0–0.1)
Basophils Relative: 0 %
Eosinophils Absolute: 0.1 10*3/uL (ref 0.0–0.5)
Eosinophils Relative: 1 %
HCT: 40.3 % (ref 36.0–46.0)
Hemoglobin: 12.4 g/dL (ref 12.0–15.0)
Immature Granulocytes: 0 %
Lymphocytes Relative: 8 %
Lymphs Abs: 0.7 10*3/uL (ref 0.7–4.0)
MCH: 29.7 pg (ref 26.0–34.0)
MCHC: 30.8 g/dL (ref 30.0–36.0)
MCV: 96.4 fL (ref 80.0–100.0)
Monocytes Absolute: 0.7 10*3/uL (ref 0.1–1.0)
Monocytes Relative: 8 %
Neutro Abs: 7.7 10*3/uL (ref 1.7–7.7)
Neutrophils Relative %: 83 %
Platelets: 150 10*3/uL (ref 150–400)
RBC: 4.18 MIL/uL (ref 3.87–5.11)
RDW: 13.2 % (ref 11.5–15.5)
WBC: 9.2 10*3/uL (ref 4.0–10.5)
nRBC: 0 % (ref 0.0–0.2)

## 2020-03-23 LAB — TYPE AND SCREEN
ABO/RH(D): O POS
Antibody Screen: NEGATIVE

## 2020-03-23 LAB — SARS CORONAVIRUS 2 BY RT PCR (HOSPITAL ORDER, PERFORMED IN ~~LOC~~ HOSPITAL LAB): SARS Coronavirus 2: NEGATIVE

## 2020-03-23 LAB — ABO/RH: ABO/RH(D): O POS

## 2020-03-23 LAB — MAGNESIUM: Magnesium: 1.8 mg/dL (ref 1.7–2.4)

## 2020-03-23 LAB — TROPONIN I (HIGH SENSITIVITY): Troponin I (High Sensitivity): 12 ng/L (ref <18)

## 2020-03-23 LAB — PROTIME-INR
INR: 1.1 (ref 0.8–1.2)
Prothrombin Time: 14 seconds (ref 11.4–15.2)

## 2020-03-23 LAB — PHOSPHORUS: Phosphorus: 5 mg/dL — ABNORMAL HIGH (ref 2.5–4.6)

## 2020-03-23 SURGERY — FIXATION, FRACTURE, INTERTROCHANTERIC, WITH INTRAMEDULLARY ROD
Anesthesia: General | Laterality: Left

## 2020-03-23 MED ORDER — ACETAMINOPHEN 500 MG PO TABS: 500.0000 mg | ORAL_TABLET | Freq: Every day | ORAL | Status: DC | PRN

## 2020-03-23 MED ORDER — 0.9 % SODIUM CHLORIDE (POUR BTL) OPTIME
TOPICAL | Status: DC | PRN
  Administered 2020-03-23: 1000 mL

## 2020-03-23 MED ORDER — DEXAMETHASONE SODIUM PHOSPHATE 10 MG/ML IJ SOLN
INTRAMUSCULAR | Status: DC | PRN
  Administered 2020-03-23: 5 mg via INTRAVENOUS

## 2020-03-23 MED ORDER — MAGNESIUM CITRATE PO SOLN: 1.0000 | Freq: Once | ORAL | Status: DC | PRN

## 2020-03-23 MED ORDER — FUROSEMIDE 20 MG PO TABS: 20.0000 mg | ORAL_TABLET | Freq: Every day | ORAL | Status: DC

## 2020-03-23 MED ORDER — ALBUMIN HUMAN 5 % IV SOLN: INTRAVENOUS | Status: DC | PRN

## 2020-03-23 MED ORDER — FENTANYL CITRATE (PF) 100 MCG/2ML IJ SOLN
25.0000 ug | INTRAMUSCULAR | Status: DC | PRN
  Administered 2020-03-23 (×2): 25 ug via INTRAVENOUS

## 2020-03-23 MED ORDER — KCL IN DEXTROSE-NACL 20-5-0.45 MEQ/L-%-% IV SOLN
INTRAVENOUS | Status: DC
  Filled 2020-03-23: qty 1000

## 2020-03-23 MED ORDER — METOCLOPRAMIDE HCL 10 MG PO TABS: 5.0000 mg | ORAL_TABLET | Freq: Three times a day (TID) | ORAL | Status: DC | PRN

## 2020-03-23 MED ORDER — NAPROXEN 250 MG PO TABS: 250.0000 mg | ORAL_TABLET | Freq: Two times a day (BID) | ORAL | Status: DC

## 2020-03-23 MED ORDER — ASPIRIN EC 81 MG PO TBEC
81.0000 mg | DELAYED_RELEASE_TABLET | Freq: Every day | ORAL | Status: DC
  Administered 2020-03-24 – 2020-03-29 (×6): 81 mg via ORAL
  Filled 2020-03-23 (×6): qty 1

## 2020-03-23 MED ORDER — PHENYLEPHRINE HCL-NACL 10-0.9 MG/250ML-% IV SOLN
INTRAVENOUS | Status: DC | PRN
  Administered 2020-03-23: 25 ug/min via INTRAVENOUS

## 2020-03-23 MED ORDER — FENTANYL CITRATE (PF) 250 MCG/5ML IJ SOLN
INTRAMUSCULAR | Status: AC
  Filled 2020-03-23: qty 5

## 2020-03-23 MED ORDER — PROPOFOL 10 MG/ML IV BOLUS
INTRAVENOUS | Status: DC | PRN
  Administered 2020-03-23: 100 mg via INTRAVENOUS

## 2020-03-23 MED ORDER — CHLORHEXIDINE GLUCONATE 4 % EX LIQD: 60.0000 mL | Freq: Once | CUTANEOUS | Status: DC

## 2020-03-23 MED ORDER — HYDRALAZINE HCL 10 MG PO TABS: 10.0000 mg | ORAL_TABLET | Freq: Four times a day (QID) | ORAL | Status: DC | PRN

## 2020-03-23 MED ORDER — MIRTAZAPINE 7.5 MG PO TABS
7.5000 mg | ORAL_TABLET | Freq: Every day | ORAL | Status: DC
  Filled 2020-03-23: qty 1

## 2020-03-23 MED ORDER — ONDANSETRON HCL 4 MG/2ML IJ SOLN
INTRAMUSCULAR | Status: DC | PRN
  Administered 2020-03-23: 4 mg via INTRAVENOUS

## 2020-03-23 MED ORDER — CEFAZOLIN SODIUM-DEXTROSE 2-3 GM-%(50ML) IV SOLR
INTRAVENOUS | Status: DC | PRN
Start: 2020-03-23 — End: 2020-03-23
  Administered 2020-03-23: 2 g via INTRAVENOUS

## 2020-03-23 MED ORDER — QUETIAPINE 12.5 MG HALF TABLET
12.5000 mg | ORAL_TABLET | Freq: Every evening | ORAL | Status: DC | PRN
  Filled 2020-03-23: qty 1

## 2020-03-23 MED ORDER — PROPOFOL 10 MG/ML IV BOLUS
INTRAVENOUS | Status: AC
  Filled 2020-03-23: qty 20

## 2020-03-23 MED ORDER — ONDANSETRON HCL 4 MG PO TABS: 4.0000 mg | ORAL_TABLET | Freq: Four times a day (QID) | ORAL | Status: DC | PRN

## 2020-03-23 MED ORDER — MEMANTINE HCL 5 MG PO TABS
10.0000 mg | ORAL_TABLET | Freq: Two times a day (BID) | ORAL | Status: DC
  Administered 2020-03-24 – 2020-03-29 (×11): 10 mg via ORAL
  Filled 2020-03-23 (×11): qty 2

## 2020-03-23 MED ORDER — ACETAMINOPHEN 500 MG PO TABS
500.0000 mg | ORAL_TABLET | Freq: Every day | ORAL | Status: DC | PRN
  Administered 2020-03-24 – 2020-03-29 (×3): 500 mg via ORAL
  Filled 2020-03-23 (×3): qty 1

## 2020-03-23 MED ORDER — HYDRALAZINE HCL 10 MG PO TABS
10.0000 mg | ORAL_TABLET | Freq: Four times a day (QID) | ORAL | Status: DC
  Administered 2020-03-24: 10 mg via ORAL
  Filled 2020-03-23: qty 1

## 2020-03-23 MED ORDER — ACETAMINOPHEN 10 MG/ML IV SOLN
INTRAVENOUS | Status: DC | PRN
Start: 2020-03-23 — End: 2020-03-23
  Administered 2020-03-23: 1000 mg via INTRAVENOUS

## 2020-03-23 MED ORDER — MIRTAZAPINE 15 MG PO TABS
7.5000 mg | ORAL_TABLET | Freq: Every day | ORAL | Status: DC
  Administered 2020-03-24 – 2020-03-28 (×5): 7.5 mg via ORAL
  Filled 2020-03-23 (×5): qty 1

## 2020-03-23 MED ORDER — MORPHINE SULFATE (PF) 2 MG/ML IV SOLN: 0.5000 mg | INTRAVENOUS | Status: DC | PRN

## 2020-03-23 MED ORDER — HYDROCODONE-ACETAMINOPHEN 5-325 MG PO TABS: 1.0000 | ORAL_TABLET | Freq: Four times a day (QID) | ORAL | Status: DC | PRN

## 2020-03-23 MED ORDER — QUETIAPINE 12.5 MG HALF TABLET
12.5000 mg | ORAL_TABLET | Freq: Every evening | ORAL | Status: DC | PRN
  Administered 2020-03-26 – 2020-03-27 (×2): 12.5 mg via ORAL
  Filled 2020-03-23 (×4): qty 1

## 2020-03-23 MED ORDER — DOCUSATE SODIUM 100 MG PO CAPS
100.0000 mg | ORAL_CAPSULE | Freq: Two times a day (BID) | ORAL | Status: DC
  Administered 2020-03-23 – 2020-03-29 (×12): 100 mg via ORAL
  Filled 2020-03-23 (×12): qty 1

## 2020-03-23 MED ORDER — POVIDONE-IODINE 10 % EX SWAB: 2.0000 "application " | Freq: Once | CUTANEOUS | Status: DC

## 2020-03-23 MED ORDER — BISACODYL 10 MG RE SUPP: 10.0000 mg | Freq: Every day | RECTAL | Status: DC | PRN

## 2020-03-23 MED ORDER — PANTOPRAZOLE SODIUM 40 MG PO TBEC
40.0000 mg | DELAYED_RELEASE_TABLET | Freq: Every day | ORAL | Status: DC
  Administered 2020-03-24 – 2020-03-29 (×6): 40 mg via ORAL
  Filled 2020-03-23 (×6): qty 1

## 2020-03-23 MED ORDER — ROCURONIUM BROMIDE 10 MG/ML (PF) SYRINGE
PREFILLED_SYRINGE | INTRAVENOUS | Status: DC | PRN
  Administered 2020-03-23: 40 mg via INTRAVENOUS

## 2020-03-23 MED ORDER — CEFAZOLIN SODIUM-DEXTROSE 2-4 GM/100ML-% IV SOLN: 2.0000 g | INTRAVENOUS | Status: DC

## 2020-03-23 MED ORDER — LACTATED RINGERS IV SOLN: INTRAVENOUS | Status: DC

## 2020-03-23 MED ORDER — SENNOSIDES-DOCUSATE SODIUM 8.6-50 MG PO TABS: 1.0000 | ORAL_TABLET | Freq: Every evening | ORAL | Status: DC | PRN

## 2020-03-23 MED ORDER — SUGAMMADEX SODIUM 200 MG/2ML IV SOLN
INTRAVENOUS | Status: DC | PRN
  Administered 2020-03-23: 200 mg via INTRAVENOUS

## 2020-03-23 MED ORDER — ENOXAPARIN SODIUM 40 MG/0.4ML ~~LOC~~ SOLN: 40.0000 mg | SUBCUTANEOUS | Status: DC

## 2020-03-23 MED ORDER — FENTANYL CITRATE (PF) 100 MCG/2ML IJ SOLN
50.0000 ug | INTRAMUSCULAR | Status: AC | PRN
  Administered 2020-03-23 (×2): 50 ug via INTRAVENOUS
  Filled 2020-03-23 (×2): qty 2

## 2020-03-23 MED ORDER — PHENYLEPHRINE HCL (PRESSORS) 10 MG/ML IV SOLN
INTRAVENOUS | Status: DC | PRN
  Administered 2020-03-23: 80 ug via INTRAVENOUS

## 2020-03-23 MED ORDER — SIMVASTATIN 20 MG PO TABS: 20.0000 mg | ORAL_TABLET | Freq: Every day | ORAL | Status: DC

## 2020-03-23 MED ORDER — SENNA 8.6 MG PO TABS
1.0000 | ORAL_TABLET | Freq: Two times a day (BID) | ORAL | Status: DC
  Administered 2020-03-23 – 2020-03-29 (×12): 8.6 mg via ORAL
  Filled 2020-03-23 (×12): qty 1

## 2020-03-23 MED ORDER — MEMANTINE HCL 10 MG PO TABS
10.0000 mg | ORAL_TABLET | Freq: Two times a day (BID) | ORAL | Status: DC
  Filled 2020-03-23 (×2): qty 1

## 2020-03-23 MED ORDER — MELATONIN 5 MG PO TABS
5.0000 mg | ORAL_TABLET | Freq: Every day | ORAL | Status: DC
  Administered 2020-03-23 – 2020-03-28 (×6): 5 mg via ORAL
  Filled 2020-03-23 (×7): qty 1

## 2020-03-23 MED ORDER — FENTANYL CITRATE (PF) 250 MCG/5ML IJ SOLN
INTRAMUSCULAR | Status: DC | PRN
  Administered 2020-03-23 (×4): 50 ug via INTRAVENOUS
  Administered 2020-03-23 (×2): 25 ug via INTRAVENOUS

## 2020-03-23 MED ORDER — TRAMADOL HCL 50 MG PO TABS
50.0000 mg | ORAL_TABLET | Freq: Four times a day (QID) | ORAL | Status: DC | PRN
  Administered 2020-03-26 – 2020-03-29 (×3): 50 mg via ORAL
  Filled 2020-03-23 (×3): qty 1

## 2020-03-23 MED ORDER — POLYETHYLENE GLYCOL 3350 17 G PO PACK: 17.0000 g | PACK | Freq: Every day | ORAL | Status: DC | PRN

## 2020-03-23 MED ORDER — LACTATED RINGERS IV SOLN: INTRAVENOUS | Status: DC | PRN

## 2020-03-23 MED ORDER — FENTANYL CITRATE (PF) 100 MCG/2ML IJ SOLN
INTRAMUSCULAR | Status: AC
  Filled 2020-03-23: qty 2

## 2020-03-23 MED ORDER — ONDANSETRON HCL 4 MG/2ML IJ SOLN: 4.0000 mg | Freq: Four times a day (QID) | INTRAMUSCULAR | Status: DC | PRN

## 2020-03-23 MED ORDER — SIMVASTATIN 20 MG PO TABS
20.0000 mg | ORAL_TABLET | Freq: Every day | ORAL | Status: DC
  Administered 2020-03-24 – 2020-03-28 (×5): 20 mg via ORAL
  Filled 2020-03-23 (×5): qty 1

## 2020-03-23 MED ORDER — ENALAPRILAT 1.25 MG/ML IV SOLN
0.6250 mg | Freq: Four times a day (QID) | INTRAVENOUS | Status: DC | PRN
  Filled 2020-03-23: qty 0.5

## 2020-03-23 MED ORDER — FUROSEMIDE 10 MG/ML IJ SOLN
20.0000 mg | Freq: Every day | INTRAMUSCULAR | Status: DC
  Administered 2020-03-23: 20 mg via INTRAVENOUS
  Filled 2020-03-23: qty 2

## 2020-03-23 MED ORDER — ENOXAPARIN SODIUM 40 MG/0.4ML ~~LOC~~ SOLN
40.0000 mg | SUBCUTANEOUS | Status: DC
  Administered 2020-03-24 – 2020-03-29 (×6): 40 mg via SUBCUTANEOUS
  Filled 2020-03-23 (×6): qty 0.4

## 2020-03-23 MED ORDER — METOCLOPRAMIDE HCL 5 MG/ML IJ SOLN: 5.0000 mg | Freq: Three times a day (TID) | INTRAMUSCULAR | Status: DC | PRN

## 2020-03-23 MED ORDER — ONDANSETRON HCL 4 MG/2ML IJ SOLN: 4.0000 mg | Freq: Once | INTRAMUSCULAR | Status: DC | PRN

## 2020-03-23 MED ORDER — PANTOPRAZOLE SODIUM 40 MG PO TBEC: 40.0000 mg | DELAYED_RELEASE_TABLET | Freq: Every day | ORAL | Status: DC

## 2020-03-23 MED ORDER — ONDANSETRON HCL 4 MG/2ML IJ SOLN: 4.0000 mg | Freq: Once | INTRAMUSCULAR | Status: DC

## 2020-03-23 SURGICAL SUPPLY — 36 items
BIT DRILL CANN LG 4.3MM (BIT) ×1 IMPLANT
CHLORAPREP W/TINT 26 (MISCELLANEOUS) ×3 IMPLANT
COVER PERINEAL POST (MISCELLANEOUS) ×3 IMPLANT
COVER SURGICAL LIGHT HANDLE (MISCELLANEOUS) ×3 IMPLANT
DRAPE STERI IOBAN 125X83 (DRAPES) ×3 IMPLANT
DRAPE U-SHAPE 47X51 STRL (DRAPES) ×3 IMPLANT
DRILL BIT CANN LG 4.3MM (BIT) ×3
DRSG MEPILEX BORDER 4X4 (GAUZE/BANDAGES/DRESSINGS) ×3 IMPLANT
DRSG MEPILEX BORDER 4X8 (GAUZE/BANDAGES/DRESSINGS) ×3 IMPLANT
ELECT REM PT RETURN 9FT ADLT (ELECTROSURGICAL)
ELECTRODE REM PT RTRN 9FT ADLT (ELECTROSURGICAL) IMPLANT
GLOVE BIO SURGEON STRL SZ7 (GLOVE) ×3 IMPLANT
GLOVE BIO SURGEON STRL SZ8 (GLOVE) ×3 IMPLANT
GLOVE BIOGEL PI IND STRL 8 (GLOVE) ×1 IMPLANT
GLOVE BIOGEL PI INDICATOR 8 (GLOVE) ×2
GOWN STRL REUS W/ TWL LRG LVL3 (GOWN DISPOSABLE) ×1 IMPLANT
GOWN STRL REUS W/ TWL XL LVL3 (GOWN DISPOSABLE) ×1 IMPLANT
GOWN STRL REUS W/TWL LRG LVL3 (GOWN DISPOSABLE) ×3
GOWN STRL REUS W/TWL XL LVL3 (GOWN DISPOSABLE) ×3
GUIDEPIN 3.2X17.5 THRD DISP (PIN) ×3 IMPLANT
HIP FRAC NAIL LAG SCR 10.5X100 (Orthopedic Implant) ×3 IMPLANT
KIT BASIN OR (CUSTOM PROCEDURE TRAY) ×3 IMPLANT
KIT TURNOVER KIT B (KITS) ×3 IMPLANT
NAIL HIP FRACT 130D 11X180 (Screw) ×3 IMPLANT
NS IRRIG 1000ML POUR BTL (IV SOLUTION) ×3 IMPLANT
PACK GENERAL/GYN (CUSTOM PROCEDURE TRAY) ×3 IMPLANT
PAD ARMBOARD 7.5X6 YLW CONV (MISCELLANEOUS) ×6 IMPLANT
SCREW BONE CORTICAL 5.0X36 (Screw) ×3 IMPLANT
SCREW CANN THRD AFF 10.5X100 (Orthopedic Implant) ×1 IMPLANT
SPONGE LAP 18X18 RF (DISPOSABLE) ×3 IMPLANT
STAPLER VISISTAT 35W (STAPLE) ×3 IMPLANT
SUT MNCRL AB 3-0 PS2 18 (SUTURE) ×3 IMPLANT
SUT VIC AB 0 CT1 27 (SUTURE) ×3
SUT VIC AB 0 CT1 27XBRD ANBCTR (SUTURE) ×1 IMPLANT
SUT VIC AB 3-0 CT1 27 (SUTURE) ×3
SUT VIC AB 3-0 CT1 TAPERPNT 27 (SUTURE) ×1 IMPLANT

## 2020-03-23 NOTE — ED Triage Notes (Signed)
Pt BIB [facility] from [facility] Pt experienced a mechanical fall this morning while walking. No loss of consciousness. Pt presents with left leg shortening and outward rotation. Pt with history of Dementia. No blood thinners. VSS.

## 2020-03-23 NOTE — Anesthesia Postprocedure Evaluation (Signed)
Anesthesia Post Note  Patient: Chelsea Jordan  Procedure(s) Performed: INTRAMEDULLARY (IM) NAIL INTERTROCHANTRIC HIP (Left )     Patient location during evaluation: PACU Anesthesia Type: General Level of consciousness: confused Pain management: pain level controlled Vital Signs Assessment: post-procedure vital signs reviewed and stable Respiratory status: spontaneous breathing, nonlabored ventilation, respiratory function stable and patient connected to nasal cannula oxygen Cardiovascular status: blood pressure returned to baseline and stable Postop Assessment: no apparent nausea or vomiting Anesthetic complications: no   No complications documented.  Last Vitals:  Vitals:   03/23/20 2236 03/23/20 2325  BP: 102/61 97/71  Pulse: (!) 59 63  Resp: 18 19  Temp: 36.8 C 36.9 C  SpO2: 100% 100%    Last Pain:  Vitals:   03/23/20 2325  TempSrc: Oral  PainSc:                  [name]

## 2020-03-23 NOTE — Anesthesia Preprocedure Evaluation (Signed)
Anesthesia Evaluation  Patient identified by MRN, date of birth, ID band Patient confused    Reviewed: Allergy & Precautions, NPO status , Patient's Chart, lab work & pertinent test results  Airway Mallampati: II  TM Distance: >3 FB     Dental  (+) Teeth Intact   Pulmonary    breath sounds clear to auscultation       Cardiovascular hypertension,  Rhythm:Irregular Rate:Normal     Neuro/Psych    GI/Hepatic   Endo/Other    Renal/GU      Musculoskeletal   Abdominal   Peds  Hematology   Anesthesia Other Findings   Reproductive/Obstetrics                             Anesthesia Physical Anesthesia Plan  ASA: III  Anesthesia Plan: General   Post-op Pain Management:    Induction: Intravenous  PONV Risk Score and Plan: Ondansetron and Dexamethasone  Airway Management Planned: Oral ETT  Additional Equipment:   Intra-op Plan:   Post-operative Plan: Extubation in OR  Informed Consent: I have reviewed the patients History and Physical, chart, labs and discussed the procedure including the risks, benefits and alternatives for the proposed anesthesia with the patient or authorized representative who has indicated his/her understanding and acceptance.       Plan Discussed with: Anesthesiologist and CRNA  Anesthesia Plan Comments:         Anesthesia Quick Evaluation

## 2020-03-23 NOTE — ED Provider Notes (Signed)
Pt from [facility] - had a fall this morning when walking - no head injury - landed on L hip - on my exam has shortened and externally rotated LLE without signs of head injury, pt c/o pain.  Needs xray to confirm - plan for surgery with pre op CXR and EKG and labs, pain control  Medical screening examination/treatment/procedure(s) were conducted as a shared visit with non-physician practitioner(s) and myself.  I personally evaluated the patient during the encounter.  Clinical Impression:   Final diagnoses:  Closed fracture of hip, unspecified laterality, initial encounter ([place])         Noemi Chapel, MD 03/23/20 1513

## 2020-03-23 NOTE — Op Note (Signed)
03/23/2020  6:04 PM  PATIENT:  Chelsea Jordan  86 y.o. female  PRE-OPERATIVE DIAGNOSIS:  Left hip intertrochanteric fracture  POST-OPERATIVE DIAGNOSIS:  Same  Procedure(s): Open treatment of left hip intertrochanteric fracture with intramedullary nail  SURGEON:  Wylene Simmer, MD  ASSISTANT: None  ANESTHESIA:   General  EBL:  150 cc  TOURNIQUET:  none  COMPLICATIONS:  None apparent  DISPOSITION:  Extubated, awake and stable to recovery.  INDICATION FOR PROCEDURE: The patient is a 86 year old female with a past medical history significant for dementia.  She fell this morning injuring her left hip.  She presents now for operative treatment of this displaced and unstable left hip intertrochanteric fracture.  Her husband understands the risks and benefits of the alternative treatment options and elect surgical treatment.  He specifically understands risks of bleeding, infection, nerve damage, blood clots, need for additional surgery, continued pain, nonunion, amputation and death.   PROCEDURE IN DETAIL: After preoperative consent was obtained and the correct operative site was identified, the patient was brought to the operating room and placed supine on the fracture table.  General anesthesia was induced.  Preoperative antibiotics were administered.  Surgical timeout was taken.  The left lower extremity was positioned in a traction boot in the right lower extremity in a well leg holder.  All bony prominences were padded well.  The left hip fracture was reduced.  AP and lateral radiographs confirmed appropriate reduction of the fracture.  The left lower extremity was then prepped and draped in standard sterile fashion with a shower curtain.  A longitudinal incision was made at the tip of the greater trochanter.  Dissection was carried sharply down through the subcutaneous tissues and through the superficial fascia.  The muscle was bluntly dissected.  A guidepin was inserted at the tip of the  greater trochanter.  Appropriate position was confirmed on AP and lateral radiographs.  The guidepin was then advanced into the medullary canal.  Starter reamer was then advanced over the guidepin opening in the medullary canal.  A [employer] affixus short nail was selected.  It was placed in the medullary canal and seated appropriately.  The targeting guide was then used to insert a guidepin through the nail and into the head of the femur.  Appropriate position was confirmed on AP and lateral radiographs.  The guidepin was then overdrilled.  A 100 mm lag screw was inserted and tightened using the compression wheel.  The screw was then locked to the nail and then backed off to the sliding position.  The targeting guide was then used to insert a distal interlocking screw in the static position.  The targeting jig was removed.  Final AP and lateral radiographs confirmed appropriate position and length of all hardware and appropriate reduction of the fracture.  The wounds were irrigated copiously.  The deep fascia was repaired with Vicryl.  Subcutaneous tissues were approximated with Monocryl.  Skin incisions were closed with staples.  Sterile dressings were applied.  The patient was awakened from anesthesia and transported to the recovery room in stable condition.   FOLLOW UP PLAN: Weightbearing as tolerated on the left lower extremity.  Follow-up in the office in 2 weeks for staple removal.  Lovenox for DVT prophylaxis.

## 2020-03-23 NOTE — ED Provider Notes (Signed)
[facility] EMERGENCY DEPARTMENT Provider Note   CSN: [id number] Arrival date & time: 03/23/20  1033     History Chief Complaint  Patient presents with  . Fall  . Hip Injury    Chelsea Jordan is a 86 y.o. female who presents emergency department for fall and left leg deformity.  History is given by the patient's husband who is at bedside as she has dementia.  He states that she lives in assisted living and he lives in independent living.  He was called by staff who reported that she had fallen this morning.  He states after that EMS came and now they are here.  EMS reports that the patient's left lower extremity is shortened and laterally rotated.  Her husband reports that she has never had a hip fracture.  HPI     Past Medical History:  Diagnosis Date  . Anxiety   . Benign fundic gland polyps of stomach   . Benign gastrointestinal stromal tumor (GIST)   . Bronchitis   . Cancer ([place])    recent skin cancer left leg  excised about 8 days ago-remains with dressing intact.   . Chronic cystitis   . COLONIC POLYPS, HX OF   . Complication of anesthesia   . Diverticulosis   . DYSLIPIDEMIA   . Gastritis   . GERD (gastroesophageal reflux disease)   . Hemorrhoids   . Hiatal hernia   . HYPERTENSION   . Macular degeneration    optho q68mo - [facility]  . Meningioma ([place])   . MITRAL VALVE PROLAPSE   . NEPHROLITHIASIS   . OSTEOARTHRITIS   . OSTEOPOROSIS   . PONV (postoperative nausea and vomiting)   . Thoracic aortic aneurysm ([facility])    4.1 cm 2015    Patient Active Problem List   Diagnosis Date Noted  . Insomnia 01/21/2020  . Weight loss 05/30/2019  . Dizziness 02/28/2019  . Dementia ([place]) 06/22/2018  . Olecranon fracture, left, closed, initial encounter 11/05/2017  . Preventative health care 07/15/2016  . Hyponatremia 12/25/2014  . Lightheadedness 12/25/2014  . Aortic stenosis 12/20/2014  . 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 Medicare annual wellness visit, subsequent 07/12/2014  . Thoracic aortic aneurysm ([place]) 07/08/2014  . Recurrent UTI 03/12/2014  . Eustachian tube dysfunction 08/16/2013  . Macular degeneration   . Mitral valve disorder 11/17/2009  . Hyperlipidemia 09/24/2009  . Essential hypertension 09/24/2009  . Osteoporosis 07/15/2009  . MENINGIOMA 10/20/2007  . 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 Asthma 10/20/2007  . NEPHROLITHIASIS 10/20/2007    Past Surgical History:  Procedure Laterality Date  . ABDOMINAL HYSTERECTOMY    . APPENDECTOMY    . BREAST SURGERY    . [place]   no PCI, performed in [place]  . Cataract surgery  2000  . CHOLECYSTECTOMY    . COLONOSCOPY    . ESOPHAGOGASTRODUODENOSCOPY    . EUS N/A 05/16/2014   Procedure: UPPER ENDOSCOPIC ULTRASOUND (EUS) LINEAR;  Surgeon: Milus Banister, MD;  Location: [facility];  Service: Endoscopy;  Laterality: N/A;  . INSERTION OF MESH  07/04/2012   Procedure: INSERTION OF MESH;  Surgeon: Odis Hollingshead, MD;  Location: [facility];  Service: General;  Laterality: N/A;  . KNEE SURGERY    . LEG SKIN LESION  BIOPSY / EXCISION Left    8 days ago pending pathology, remains with dressing.  . SURGERY OF LIP  in 2002  .  TONSILLECTOMY    . VENTRAL HERNIA REPAIR  07/04/2012   Procedure: HERNIA REPAIR VENTRAL ADULT;  Surgeon: Odis Hollingshead, MD;  Location: [facility];  Service: General;  Laterality: N/A;     OB History   No obstetric history on file.     Family History  Problem Relation Age of Onset  . Heart disease Other   . Heart disease Mother   . Heart disease Father   . Emphysema Father   . Heart attack Father   . Breast cancer Sister   . Lung cancer Sister   . Prostate cancer Son   . Prostate cancer Brother     Social History   Tobacco Use  . Smoking status: Never Smoker  . Smokeless tobacco: Never Used  . Tobacco comment: Married, lives with spouse, Enjoys golf.  Retired  Media planner  . Vaping Use: Never used  Substance Use Topics  . Alcohol use: Yes    Alcohol/week: 6.0 standard drinks    Types: 6 Standard drinks or equivalent per week    Comment: Wine -nightly  . Drug use: No    Home Medications Prior to Admission medications   Medication Sig Start Date End Date Taking? Authorizing Provider  acetaminophen (TYLENOL) 500 MG tablet Take 500 mg by mouth daily as needed.     [provider]  esomeprazole (NEXIUM) 40 MG capsule TAKE 1 CAPSULE BY MOUTH 30-60 MIN BEFORE YOUR FIRST AND LAST MEAL OF THE DAY 05/29/19   Virgie Dad, MD  melatonin 5 MG TABS Take 5 mg by mouth at bedtime.     [provider]  memantine (NAMENDA) 10 MG tablet TAKE 1 TABLET BY MOUTH TWICE A DAY 12/18/19   Virgie Dad, MD  mirtazapine (REMERON) 7.5 MG tablet Take 7.5 mg by mouth at bedtime.    [provider]  QUEtiapine (SEROQUEL) 12.5 mg TABS tablet Take 12.5 mg by mouth at bedtime as needed.  01/21/20 02/03/20  [provider]  simvastatin (ZOCOR) 5 MG tablet TAKE 1 TABLET BY MOUTH EVERYDAY AT BEDTIME 08/23/19   Biagio Borg, MD    Allergies    Pentothal [thiopental], Codeine, Levofloxacin, Oxycodone-acetaminophen, Sulfonamide derivatives, and Tape  Review of Systems   Review of Systems  Unable to perform ROS: Dementia    Physical Exam Updated Vital Signs BP (!) 185/90 (BP Location: Right Arm)   Pulse (!) 56   Temp 98.2 F (36.8 C) (Oral)   Resp 18   SpO2 98%   Physical Exam Vitals and nursing note reviewed.  Constitutional:      General: She is not in acute distress.    Appearance: She is well-developed. She is not diaphoretic.  HENT:     Head: Normocephalic and atraumatic.  Eyes:     General: No scleral icterus.    Conjunctiva/sclera: Conjunctivae normal.  Cardiovascular:     Rate and Rhythm: Normal rate and regular rhythm.     Heart sounds: Normal heart sounds. No murmur heard.  No friction rub. No gallop.     Pulmonary:     Effort: Pulmonary effort is normal. No respiratory distress.     Breath sounds: Normal breath sounds.  Abdominal:     General: Bowel sounds are normal. There is no distension.     Palpations: Abdomen is soft. There is no mass.     Tenderness: There is no abdominal tenderness. There is no guarding.  Musculoskeletal:     Cervical back: Normal range  of motion.     Right lower leg: Edema present.     Left lower leg: Edema present.     Comments: LLE is shortened and externally rotated.  She has normal DP pulse bilaterally.  Tender over the left hip.  Skin:    General: Skin is warm and dry.  Neurological:     Mental Status: She is alert. Mental status is at baseline.  Psychiatric:        Behavior: Behavior normal.     ED Results / Procedures / Treatments   Labs (all labs ordered are listed, but only abnormal results are displayed) Labs Reviewed - No data to display  EKG None  Radiology No results found.  Procedures Procedures (including critical care time)  Medications Ordered in ED Medications - No data to display  ED Course  I have reviewed the triage vital signs and the nursing notes.  Pertinent labs & imaging results that were available during my care of the patient were reviewed by me and considered in my medical decision making (see chart for details).    MDM Rules/Calculators/A&P                         86 year old female with a history of dementia.  She had a mechanical fall and suffered a left intertrochanteric hip fracture.  She is neurovascularly intact.  I have ordered, interpreted and reviewed labs which include PT/INR and type and screen, CBC which shows no abnormalities.  BMP shows mildly elevated glucose at 130.  Covid test is negative.  I consulted and reviewed the images with Dr. Doran Durand who plans on taking the patient to the OR at 4 PM today.  Last meal was at 7 AM this morning.  I ordered and reviewed 1 view chest x-ray and left hip x-ray.   She has no acute abnormalities on her chest x-ray.  The hip x-ray shows a left-sided intertrochanteric fracture.  Patient is otherwise stable  Final Clinical Impression(s) / ED Diagnoses Final diagnoses:  None    Rx / DC Orders ED Discharge Orders    None       Margarita Mail, PA-C 03/23/20 1442    Noemi Chapel, MD 03/23/20 1513

## 2020-03-23 NOTE — Consult Note (Addendum)
Reason for Consult: Left hip pain Referring Physician: Dr. Clement Jordan is an 86 y.o. female.  HPI: The patient is a 86 year old female with a past medical history significant for dementia.  She fell this morning at her assisted living facility.  Her husband accompanies her and provides history.  She complains of pain in her left hip.  He reports that she has never had any injury or surgery to that hip in the past.  By report she is not diabetic.  She is not a smoker.  She complains that her pain is worse with any attempted motion.  She feels better lying down still.  She last ate before 8 AM this morning.  She does not take any blood thinners.  Past Medical History:  Diagnosis Date  . Anxiety   . Benign fundic gland polyps of stomach   . Benign gastrointestinal stromal tumor (GIST)   . Bronchitis   . Cancer ([place])    recent skin cancer left leg  excised about 8 days ago-remains with dressing intact.   . Chronic cystitis   . COLONIC POLYPS, HX OF   . Complication of anesthesia   . Diverticulosis   . DYSLIPIDEMIA   . Gastritis   . GERD (gastroesophageal reflux disease)   . Hemorrhoids   . Hiatal hernia   . HYPERTENSION   . Macular degeneration    optho q82mo - [facility]  . Meningioma ([place])   . MITRAL VALVE PROLAPSE   . NEPHROLITHIASIS   . OSTEOARTHRITIS   . OSTEOPOROSIS   . PONV (postoperative nausea and vomiting)   . Thoracic aortic aneurysm ([facility])    4.1 cm 2015    Past Surgical History:  Procedure Laterality Date  . ABDOMINAL HYSTERECTOMY    . APPENDECTOMY    . BREAST SURGERY    . [place]   no PCI, performed in [place]  . Cataract surgery  2000  . CHOLECYSTECTOMY    . COLONOSCOPY    . ESOPHAGOGASTRODUODENOSCOPY    . EUS N/A 05/16/2014   Procedure: UPPER ENDOSCOPIC ULTRASOUND (EUS) LINEAR;  Surgeon: Milus Banister, MD;  [facility];  Service: Endoscopy;  Laterality: N/A;  . INSERTION OF MESH  07/04/2012   Procedure: INSERTION OF  MESH;  Surgeon: Odis Hollingshead, MD;  Location: [facility];  Service: General;  Laterality: N/A;  . KNEE SURGERY    . LEG SKIN LESION  BIOPSY / EXCISION Left    8 days ago pending pathology, remains with dressing.  . SURGERY OF LIP  in 2002  . TONSILLECTOMY    . VENTRAL HERNIA REPAIR  07/04/2012   Procedure: HERNIA REPAIR VENTRAL ADULT;  Surgeon: Odis Hollingshead, MD;  Location: [facility];  Service: General;  Laterality: N/A;    Family History  Problem Relation Age of Onset  . Heart disease Other   . Heart disease Mother   . Heart disease Father   . Emphysema Father   . Heart attack Father   . Breast cancer Sister   . Lung cancer Sister   . Prostate cancer Son   . Prostate cancer Brother     Social History:  reports that she has never smoked. She has never used smokeless tobacco. She reports current alcohol use of about 6.0 standard drinks of alcohol per week. She reports that she does not use drugs.  Allergies:  Allergies  Allergen Reactions  . Pentothal [Thiopental] Nausea And Vomiting  . Codeine   .  Levofloxacin   . Oxycodone-Acetaminophen   . Sodium Chloride 0.9% [Sodium Chloride]   . Sulfonamide Derivatives     REACTION: GI upset/nausea/vomiting  . Tape Rash    Per patient adhesive tape    Medications: I have reviewed the patient's current medications.  Results for orders placed or performed during the hospital encounter of 03/23/20 (from the past 48 hour(s))  Basic metabolic panel     Status: Abnormal   Collection Time: 03/23/20 11:45 AM  Result Value Ref Range   Sodium 141 135 - 145 mmol/L   Potassium 3.9 3.5 - 5.1 mmol/L   Chloride 108 98 - 111 mmol/L   CO2 25 22 - 32 mmol/L   Glucose, Bld 130 (H) 70 - 99 mg/dL    Comment: Glucose reference range applies only to samples taken after fasting for at least 8 hours.   BUN 15 8 - 23 mg/dL   Creatinine, Ser 0.68 0.44 - 1.00 mg/dL   Calcium 8.9 8.9 - 10.3 mg/dL   GFR calc non Af Amer >60 >60 mL/min   GFR calc Af Amer  >60 >60 mL/min   Anion gap 8 5 - 15    Comment: Performed at [facility] [address], [place], [place] [address]  CBC WITH DIFFERENTIAL     Status: None   Collection Time: 03/23/20 11:45 AM  Result Value Ref Range   WBC 9.2 4.0 - 10.5 K/uL   RBC 4.18 3.87 - 5.11 MIL/uL   Hemoglobin 12.4 12.0 - 15.0 g/dL   HCT 40.3 36 - 46 %   MCV 96.4 80.0 - 100.0 fL   MCH 29.7 26.0 - 34.0 pg   MCHC 30.8 30.0 - 36.0 g/dL   RDW 13.2 11.5 - 15.5 %   Platelets 150 150 - 400 K/uL   nRBC 0.0 0.0 - 0.2 %   Neutrophils Relative % 83 %   Neutro Abs 7.7 1.7 - 7.7 K/uL   Lymphocytes Relative 8 %   Lymphs Abs 0.7 0.7 - 4.0 K/uL   Monocytes Relative 8 %   Monocytes Absolute 0.7 0 - 1 K/uL   Eosinophils Relative 1 %   Eosinophils Absolute 0.1 0 - 0 K/uL   Basophils Relative 0 %   Basophils Absolute 0.0 0 - 0 K/uL   Immature Granulocytes 0 %   Abs Immature Granulocytes 0.04 0.00 - 0.07 K/uL    Comment: Performed at [facility], [address], [place], [place] [address]  Protime-INR     Status: None   Collection Time: 03/23/20 11:45 AM  Result Value Ref Range   Prothrombin Time 14.0 11.4 - 15.2 seconds   INR 1.1 0.8 - 1.2    Comment: (NOTE) INR goal varies based on device and disease states. Performed at [facility], [place] [address] [place], [place] [address]   Type and screen [place]     Status: None   Collection Time: 03/23/20 11:58 AM  Result Value Ref Range   ABO/RH(D) O POS    Antibody Screen NEG    Sample Expiration      03/26/2020,2359 Performed at [facility], [place] [address], [place], [place] [address]   SARS Coronavirus 2 by RT PCR (hospital order, performed in [facility] hospital lab) Nasopharyngeal Nasopharyngeal Swab     Status: None   Collection Time: 03/23/20 12:10 PM   Specimen: Nasopharyngeal Swab  Result Value Ref Range   SARS Coronavirus  2 NEGATIVE NEGATIVE    Comment: (NOTE) SARS-CoV-2 target nucleic acids are NOT  DETECTED.  The SARS-CoV-2 RNA is generally detectable in upper and lower respiratory specimens during the acute phase of infection. The lowest concentration of SARS-CoV-2 viral copies this assay can detect is 250 copies / mL. A negative result does not preclude SARS-CoV-2 infection and should not be used as the sole basis for treatment or other patient management decisions.  A negative result may occur with improper specimen collection / handling, submission of specimen other than nasopharyngeal swab, presence of viral mutation(s) within the areas targeted by this assay, and inadequate number of viral copies (<250 copies / mL). A negative result must be combined with clinical observations, patient history, and epidemiological information.  Fact Sheet for Patients:   StrictlyIdeas.no  Fact Sheet for Healthcare Providers: BankingDealers.co.za  This test is not yet approved or  cleared by the [place] FDA and has been authorized for detection and/or diagnosis of SARS-CoV-2 by FDA under an Emergency Use Authorization (EUA).  This EUA will remain in effect (meaning this test can be used) for the duration of the COVID-19 declaration under Section 564(b)(1) of the Act, 21 U.S.C. section 360bbb-3(b)(1), unless the authorization is terminated or revoked sooner.  Performed at [facility], [place] [address], [place], [place] [address]     DG Chest 1 View  Result Date: Apr 06, 2020 CLINICAL DATA:  [place] this morning. EXAM: CHEST  1 VIEW COMPARISON:  04/17/2015 FINDINGS: Interval enlargement of the cardiac silhouette with increased prominence of the pulmonary vasculature and interstitial markings. Diffuse osteopenia is again demonstrated as well as mild moderate thoracolumbar scoliosis and degenerative changes. IMPRESSION: Interval cardiomegaly and mild changes of congestive heart failure. Electronically Signed   By: Claudie Revering M.D.   On:  04-06-2020 12:29   DG Hip Unilat With Pelvis 2-3 Views Left  Result Date: Apr 06, 2020 CLINICAL DATA:  Left hip pain following a fall this morning. EXAM: DG HIP (WITH OR WITHOUT PELVIS) 2-3V LEFT COMPARISON:  11/04/2017. FINDINGS: Acute, comminuted left intertrochanteric fracture with varus angulation. Interval healing of the previously demonstrated left superior and inferior pubic ramus fractures. Atheromatous arterial calcifications. Diffuse osteopenia. IMPRESSION: Acute, comminuted left intertrochanteric fracture with varus angulation. Electronically Signed   By: Claudie Revering M.D.   On: 2020-04-06 12:27    ROS: No recent fever, chills, nausea, vomiting or changes in her appetite PE:  Blood pressure (!) 185/78, pulse (!) 56, temperature 98.2 F (36.8 C), temperature source Oral, resp. rate 16, SpO2 93 %. Well-nourished well-developed elderly woman in no apparent distress.  Alert.  Oriented to person.  Extraocular motions are intact.  Respirations are unlabored.  The left lower extremity is shortened and externally rotated.  2+ dorsalis pedis pulse.  Intact sensibility to light touch at the left foot dorsally and plantarly.  Active plantar flexion and dorsiflexion strength at the toes.  No lymphadenopathy.  Assessment/Plan: Left hip displaced and comminuted intertrochanteric fracture.  To the operating room today for open treatment with intramedullary nailing.  Her husband understands the risks and benefits of the alternative treatment options and elect surgical treatment.  He specifically understands risks of bleeding, infection, nerve damage, blood clots, need for additional surgery, continued pain, nonunion, amputation and death.  Chelsea Jordan 2020-04-06, 4:58 PM

## 2020-03-23 NOTE — Anesthesia Procedure Notes (Signed)
Procedure Name: Intubation Date/Time: 03/23/2020 4:51 PM Performed by: Clearnce Sorrel, CRNA Pre-anesthesia Checklist: Patient identified, Emergency Drugs available, Suction available, Patient being monitored and Timeout performed Patient Re-evaluated:Patient Re-evaluated prior to induction Oxygen Delivery Method: Circle system utilized Preoxygenation: Pre-oxygenation with 100% oxygen Induction Type: IV induction Ventilation: Mask ventilation without difficulty Laryngoscope Size: Mac and 3 Grade View: Grade I Tube type: Oral Tube size: 7.0 mm Number of attempts: 1 Airway Equipment and Method: Stylet Placement Confirmation: ETT inserted through vocal cords under direct vision,  positive ETCO2 and breath sounds checked- equal and bilateral Secured at: 22 cm Tube secured with: Tape Dental Injury: Teeth and Oropharynx as per pre-operative assessment

## 2020-03-23 NOTE — H&P (Addendum)
History and Physical    Chelsea Jordan GBT:[id number] DOB: 06-26-1927 DOA: 03/23/2020  PCP: Virgie Dad, MD (Confirm with patient/family/NH records and if not entered, this has to be entered at [facility] point of entry) Patient coming from: Assisted living  I have personally briefly reviewed patient's old medical records in [place]  Chief Complaint: Left hip pain  HPI: Chelsea Jordan is a 86 y.o. female with medical history significant of advanced dementia, chronic ambulatory dysfunction, hypertension, GIST tumor, aortic aneurysm (4.3 cm in 2019), HLD, presented with mechanical fall and left hip pain.  Patient demented confused, most of the history provided by husband at bedside.  Husband reported the patient lived since assisted living and uses a cane to assist ambulate.  This morning patient tripped over a closing door and fell on the left hip.  Unable to get back because severe pain of the left leg and came to the ED. ED Course: Acute left-sided intertrochanteric fracture comminuted  Review of Systems: Unable to perform patient demented  Past Medical History:  Diagnosis Date  . Anxiety   . Benign fundic gland polyps of stomach   . Benign gastrointestinal stromal tumor (GIST)   . Bronchitis   . Cancer ([place])    recent skin cancer left leg  excised about 8 days ago-remains with dressing intact.   . Chronic cystitis   . COLONIC POLYPS, HX OF   . Complication of anesthesia   . Diverticulosis   . DYSLIPIDEMIA   . Gastritis   . GERD (gastroesophageal reflux disease)   . Hemorrhoids   . Hiatal hernia   . HYPERTENSION   . Macular degeneration    optho q12mo - [place]  . Meningioma ([place])   . MITRAL VALVE PROLAPSE   . NEPHROLITHIASIS   . OSTEOARTHRITIS   . OSTEOPOROSIS   . PONV (postoperative nausea and vomiting)   . Thoracic aortic aneurysm ([facility])    4.1 cm 2015    Past Surgical History:  Procedure Laterality Date  . ABDOMINAL HYSTERECTOMY    . APPENDECTOMY    . BREAST  SURGERY    . [place]   no PCI, performed in [place]  . Cataract surgery  2000  . CHOLECYSTECTOMY    . COLONOSCOPY    . ESOPHAGOGASTRODUODENOSCOPY    . EUS N/A 05/16/2014   Procedure: UPPER ENDOSCOPIC ULTRASOUND (EUS) LINEAR;  Surgeon: Milus Banister, MD;  Location: [facility];  Service: Endoscopy;  Laterality: N/A;  . INSERTION OF MESH  07/04/2012   Procedure: INSERTION OF MESH;  Surgeon: Odis Hollingshead, MD;  Location: [facility];  Service: General;  Laterality: N/A;  . KNEE SURGERY    . LEG SKIN LESION  BIOPSY / EXCISION Left    8 days ago pending pathology, remains with dressing.  . SURGERY OF LIP  in 2002  . TONSILLECTOMY    . VENTRAL HERNIA REPAIR  07/04/2012   Procedure: HERNIA REPAIR VENTRAL ADULT;  Surgeon: Odis Hollingshead, MD;  Location: [facility];  Service: General;  Laterality: N/A;     reports that she has never smoked. She has never used smokeless tobacco. She reports current alcohol use of about 6.0 standard drinks of alcohol per week. She reports that she does not use drugs.  Allergies  Allergen Reactions  . Pentothal [Thiopental] Nausea And Vomiting  . Codeine   . Levofloxacin   . Oxycodone-Acetaminophen   . Sodium Chloride 0.9% [Sodium Chloride]   . Sulfonamide Derivatives  REACTION: GI upset/nausea/vomiting  . Tape Rash    Per patient adhesive tape    Family History  Problem Relation Age of Onset  . Heart disease Other   . Heart disease Mother   . Heart disease Father   . Emphysema Father   . Heart attack Father   . Breast cancer Sister   . Lung cancer Sister   . Prostate cancer Son   . Prostate cancer Brother      Prior to Admission medications   Medication Sig Start Date End Date Taking? Authorizing Provider  divalproex (DEPAKOTE) 125 MG DR tablet Take 125 mg by mouth 2 (two) times daily. 02/26/20  Yes [provider]  esomeprazole (NEXIUM) 40 MG capsule TAKE 1 CAPSULE BY MOUTH 30-60 MIN BEFORE YOUR FIRST AND LAST  MEAL OF THE DAY Patient taking differently: Take 40 mg by mouth in the morning.  05/29/19  Yes Virgie Dad, MD  melatonin 5 MG TABS Take 5 mg by mouth at bedtime.    Yes [provider]  memantine (NAMENDA) 10 MG tablet TAKE 1 TABLET BY MOUTH TWICE A DAY Patient taking differently: Take 10 mg by mouth 2 (two) times daily.  12/18/19  Yes Virgie Dad, MD  mirtazapine (REMERON) 7.5 MG tablet Take 7.5 mg by mouth at bedtime.   Yes [provider]  QUEtiapine (SEROQUEL) 12.5 mg TABS tablet Take 12.5 mg by mouth at bedtime.  01/21/20 03/23/20 Yes [provider]  simvastatin (ZOCOR) 10 MG tablet Take 5 mg by mouth at bedtime. 02/22/20  Yes [provider]  simvastatin (ZOCOR) 5 MG tablet TAKE 1 TABLET BY MOUTH EVERYDAY AT BEDTIME Patient not taking: Reported on 03/23/2020 08/23/19   Biagio Borg, MD    Physical Exam: Vitals:   03/23/20 1035 03/23/20 1300  BP: (!) 185/90 (!) 161/80  Pulse: (!) 56 (!) 51  Resp: 18 (!) 25  Temp: 98.2 F (36.8 C)   TempSrc: Oral   SpO2: 98% 95%    Constitutional: NAD, calm, comfortable Vitals:   03/23/20 1035 03/23/20 1300  BP: (!) 185/90 (!) 161/80  Pulse: (!) 56 (!) 51  Resp: 18 (!) 25  Temp: 98.2 F (36.8 C)   TempSrc: Oral   SpO2: 98% 95%   Eyes: PERRL, lids and conjunctivae normal ENMT: Mucous membranes are moist. Posterior pharynx clear of any exudate or lesions.Normal dentition.  Neck: normal, supple, no masses, no thyromegaly Respiratory: clear to auscultation bilaterally, no wheezing, no crackles. Normal respiratory effort. No accessory muscle use.  Cardiovascular: Regular rate and rhythm, systolic murmur heart base.  2+ extremity edema. 2+ pedal pulses. No carotid bruits.  Abdomen: no tenderness, no masses palpated. No hepatosplenomegaly. Bowel sounds positive.  Musculoskeletal: Left leg shortened and rotated Skin: no rashes, lesions, ulcers. No induration Neurologic: Following simple  command Psychiatric: Confused   Labs on Admission: I have personally reviewed following labs and imaging studies  CBC: Recent Labs  Lab 03/23/20 1145  WBC 9.2  NEUTROABS 7.7  HGB 12.4  HCT 40.3  MCV 96.4  PLT 277   Basic Metabolic Panel: Recent Labs  Lab 03/23/20 1145  NA 141  K 3.9  CL 108  CO2 25  GLUCOSE 130*  BUN 15  CREATININE 0.68  CALCIUM 8.9   GFR: CrCl cannot be calculated (Unknown ideal weight.). Liver Function Tests: No results for input(s): AST, ALT, ALKPHOS, BILITOT, PROT, ALBUMIN in the last 168 hours. No results for input(s): LIPASE, AMYLASE in the  last 168 hours. No results for input(s): AMMONIA in the last 168 hours. Coagulation Profile: Recent Labs  Lab 03/23/20 1145  INR 1.1   Cardiac Enzymes: No results for input(s): CKTOTAL, CKMB, CKMBINDEX, TROPONINI in the last 168 hours. BNP (last 3 results) No results for input(s): PROBNP in the last 8760 hours. HbA1C: No results for input(s): HGBA1C in the last 72 hours. CBG: No results for input(s): GLUCAP in the last 168 hours. Lipid Profile: No results for input(s): CHOL, HDL, LDLCALC, TRIG, CHOLHDL, LDLDIRECT in the last 72 hours. Thyroid Function Tests: No results for input(s): TSH, T4TOTAL, FREET4, T3FREE, THYROIDAB in the last 72 hours. Anemia Panel: No results for input(s): VITAMINB12, FOLATE, FERRITIN, TIBC, IRON, RETICCTPCT in the last 72 hours. Urine analysis:    Component Value Date/Time   COLORURINE YELLOW 06/22/2018 1529   APPEARANCEUR SL CLOUDY (A) 06/22/2018 1529   LABSPEC 1.020 06/22/2018 1529   PHURINE 6.0 06/22/2018 1529   GLUCOSEU NEGATIVE 06/22/2018 1529   HGBUR SMALL (A) 06/22/2018 1529   BILIRUBINUR SMALL (A) 06/22/2018 1529   BILIRUBINUR negative 07/12/2014 0851   KETONESUR TRACE (A) 06/22/2018 1529   PROTEINUR 100 (A) 11/05/2017 0235   UROBILINOGEN 0.2 06/22/2018 1529   NITRITE NEGATIVE 06/22/2018 1529   LEUKOCYTESUR MODERATE (A) 06/22/2018 1529     Radiological Exams on Admission: DG Chest 1 View  Result Date: 03/23/2020 CLINICAL DATA:  [place] this morning. EXAM: CHEST  1 VIEW COMPARISON:  04/17/2015 FINDINGS: Interval enlargement of the cardiac silhouette with increased prominence of the pulmonary vasculature and interstitial markings. Diffuse osteopenia is again demonstrated as well as mild moderate thoracolumbar scoliosis and degenerative changes. IMPRESSION: Interval cardiomegaly and mild changes of congestive heart failure. Electronically Signed   By: Claudie Revering M.D.   On: 03/23/2020 12:29   DG Hip Unilat With Pelvis 2-3 Views Left  Result Date: 03/23/2020 CLINICAL DATA:  Left hip pain following a fall this morning. EXAM: DG HIP (WITH OR WITHOUT PELVIS) 2-3V LEFT COMPARISON:  11/04/2017. FINDINGS: Acute, comminuted left intertrochanteric fracture with varus angulation. Interval healing of the previously demonstrated left superior and inferior pubic ramus fractures. Atheromatous arterial calcifications. Diffuse osteopenia. IMPRESSION: Acute, comminuted left intertrochanteric fracture with varus angulation. Electronically Signed   By: Claudie Revering M.D.   On: 03/23/2020 12:27    EKG: Independently reviewed. A-fib  Assessment/Plan Active Problems:   Hip fracture, left, closed, initial encounter [facility])   Hip fracture ([place])  (please populate well all problems here in Problem List. (For example, if patient is on BP meds at home and you resume or decide to hold them, it is a problem that needs to be her. Same for CAD, COPD, HLD and so on)  Left intertrochanteric comminuted fracture -ORIF this afternoon -Husband confirmed patient has no history of heart attack CHF or stroke.  No diabetes.  No history of exertional dyspnea or chest pains in the past. 30 days risk 3.9%, medically cleared for ORIF with acceptable risk.  Discussed with husband at bedside who agreed.  Probably paroxysmal A. Fib -CHADS2=2, rather bradycardia now, will start  aspirin, given her prolonged history of advanced dementia and ambulation dysfunction, she is a poor candidate for systemic anticoagulation. -Start ASA  Uncontrolled HTN -Patient was on ARB unknown dosage, will start patient on hydralazine given her bradycardia -As needed Enalaprilat  Peripheral edema -Seems chronic issue, has been following with PCP -Now has signs of fluid overload, suspect pt may have diastolic CHF given her EKG also showing a  LVH, will start IV Lasix 20 mg daily -Check echocardiogram -Probably will need more aggressive blood pressure control.  Advanced dementia -Continue home medications  HLD -On statin  Aortic Aneurysm -annual CT  DVT prophylaxis: Lovenox  code Status: DNR Family Communication: Husband at bedside Disposition Plan: Likely will need 2 to 3 days hospital stay for ORIF, PT evaluation and blood pressure control Consults called: Orthopedic surgery Admission status: Telemetry admission   Lequita Halt MD [facility] Pager [phone]   03/23/2020, 1:50 PM

## 2020-03-23 NOTE — Transfer of Care (Signed)
Immediate Anesthesia Transfer of Care Note  Patient: Chelsea Jordan  Procedure(s) Performed: INTRAMEDULLARY (IM) NAIL INTERTROCHANTRIC HIP (Left )  Patient Location: PACU  Anesthesia Type:General  Level of Consciousness: sedated  Airway & Oxygen Therapy: Patient Spontanous Breathing and Patient connected to face mask oxygen  Post-op Assessment: Report given to RN and Post -op Vital signs reviewed and stable  Post vital signs: Reviewed and stable  Last Vitals:  Vitals Value Taken Time  BP 181/115 03/23/20 1820  Temp 37 C 03/23/20 1820  Pulse 70 03/23/20 1822  Resp 19 03/23/20 1823  SpO2 100 % 03/23/20 1822  Vitals shown include unvalidated device data.  Last Pain:  Vitals:   03/23/20 1433  TempSrc:   PainSc: 7          Complications: No complications documented.

## 2020-03-24 ENCOUNTER — Encounter (HOSPITAL_COMMUNITY): Payer: Self-pay | Admitting: Orthopedic Surgery

## 2020-03-24 ENCOUNTER — Inpatient Hospital Stay (HOSPITAL_COMMUNITY): Payer: Medicare Other

## 2020-03-24 DIAGNOSIS — S72009A Fracture of unspecified part of neck of unspecified femur, initial encounter for closed fracture: Secondary | ICD-10-CM

## 2020-03-24 DIAGNOSIS — I340 Nonrheumatic mitral (valve) insufficiency: Secondary | ICD-10-CM

## 2020-03-24 DIAGNOSIS — I351 Nonrheumatic aortic (valve) insufficiency: Secondary | ICD-10-CM

## 2020-03-24 DIAGNOSIS — I361 Nonrheumatic tricuspid (valve) insufficiency: Secondary | ICD-10-CM

## 2020-03-24 LAB — ECHOCARDIOGRAM COMPLETE
AR max vel: 1.43 cm2
AV Area VTI: 1.73 cm2
AV Area mean vel: 1.63 cm2
AV Mean grad: 9 mmHg
AV Peak grad: 19.2 mmHg
Ao pk vel: 2.19 m/s
Area-P 1/2: 2.93 cm2
Height: 60 in
P 1/2 time: 989 msec
S' Lateral: 2.7 cm
Weight: 2095.25 oz

## 2020-03-24 LAB — BASIC METABOLIC PANEL
Anion gap: 9 (ref 5–15)
BUN: 17 mg/dL (ref 8–23)
CO2: 27 mmol/L (ref 22–32)
Calcium: 8.4 mg/dL — ABNORMAL LOW (ref 8.9–10.3)
Chloride: 104 mmol/L (ref 98–111)
Creatinine, Ser: 0.73 mg/dL (ref 0.44–1.00)
GFR calc Af Amer: >60 mL/min (ref >60)
GFR calc non Af Amer: >60 mL/min (ref >60)
Glucose, Bld: 177 mg/dL — ABNORMAL HIGH (ref 70–99)
Potassium: 4 mmol/L (ref 3.5–5.1)
Sodium: 140 mmol/L (ref 135–145)

## 2020-03-24 LAB — CBC
HCT: 31.5 % — ABNORMAL LOW (ref 36.0–46.0)
Hemoglobin: 9.7 g/dL — ABNORMAL LOW (ref 12.0–15.0)
MCH: 29.3 pg (ref 26.0–34.0)
MCHC: 30.8 g/dL (ref 30.0–36.0)
MCV: 95.2 fL (ref 80.0–100.0)
Platelets: 149 10*3/uL — ABNORMAL LOW (ref 150–400)
RBC: 3.31 MIL/uL — ABNORMAL LOW (ref 3.87–5.11)
RDW: 13.2 % (ref 11.5–15.5)
WBC: 12.8 10*3/uL — ABNORMAL HIGH (ref 4.0–10.5)
nRBC: 0 % (ref 0.0–0.2)

## 2020-03-24 LAB — TROPONIN I (HIGH SENSITIVITY): Troponin I (High Sensitivity): 15 ng/L (ref <18)

## 2020-03-24 LAB — MRSA PCR SCREENING: MRSA by PCR: NEGATIVE

## 2020-03-24 MED ORDER — HYDRALAZINE HCL 10 MG PO TABS: 10.0000 mg | ORAL_TABLET | Freq: Three times a day (TID) | ORAL | Status: DC | PRN

## 2020-03-24 MED ORDER — SENNA 8.6 MG PO TABS: 2.0000 | ORAL_TABLET | Freq: Two times a day (BID) | ORAL | 0 refills | Status: AC

## 2020-03-24 MED ORDER — ASPIRIN EC 81 MG PO TBEC
81.0000 mg | DELAYED_RELEASE_TABLET | Freq: Two times a day (BID) | ORAL | 0 refills | Status: DC
Start: 2020-03-24 — End: 2020-04-17

## 2020-03-24 MED ORDER — LACTATED RINGERS IV SOLN: INTRAVENOUS | Status: DC

## 2020-03-24 MED ORDER — OXYCODONE HCL 5 MG PO TABS: 5.0000 mg | ORAL_TABLET | ORAL | 0 refills | Status: AC | PRN

## 2020-03-24 MED ORDER — DOCUSATE SODIUM 100 MG PO CAPS: 100.0000 mg | ORAL_CAPSULE | Freq: Two times a day (BID) | ORAL | 0 refills | Status: AC

## 2020-03-24 MED ORDER — ENSURE ENLIVE PO LIQD
237.0000 mL | Freq: Every day | ORAL | Status: DC
  Administered 2020-03-24 – 2020-03-28 (×4): 237 mL via ORAL

## 2020-03-24 NOTE — Discharge Instructions (Signed)
Wylene Simmer, MD [facility]  Please read the following information regarding your care after surgery.  Medications  You only need a prescription for the narcotic pain medicine (ex. oxycodone, Percocet, Norco).  All of the other medicines listed below are available over the counter. X Aleve 2 pills twice a day for the first 3 days after surgery. X acetominophen (Tylenol) 650 mg every 4-6 hours as you need for minor to moderate pain X oxycodone as prescribed for severe pain  Narcotic pain medicine (ex. oxycodone, Percocet, Vicodin) will cause constipation.  To prevent this problem, take the following medicines while you are taking any pain medicine. X docusate sodium (Colace) 100 mg twice a day X senna (Senokot) 2 tablets twice a day  X To help prevent blood clots, take a baby aspirin (81 mg) twice a day for six weeks after surgery.  You should also get up every hour while you are awake to move around.    Weight Bearing X Bear weight when you are able on your operated leg or foot.  Cast / Splint / Dressing X Keep your splint, cast or dressing clean and dry.  Dont put anything (coat hanger, pencil, etc) down inside of it.  If it gets damp, use a hair dryer on the cool setting to dry it.  If it gets soaked, call the office to schedule an appointment for a cast change.   After your dressing, cast or splint is removed; you may shower, but do not soak or scrub the wound.  Allow the water to run over it, and then gently pat it dry.  Swelling It is normal for you to have swelling where you had surgery.  To reduce swelling and pain, keep your toes above your nose for at least 3 days after surgery.  It may be necessary to keep your foot or leg elevated for several weeks.  If it hurts, it should be elevated.  Follow Up Call my office at [phone] when you are discharged from the hospital or surgery center to schedule an appointment to be seen two weeks after surgery.  Call my office at  [phone] if you develop a fever >101.5 F, nausea, vomiting, bleeding from the surgical site or severe pain.

## 2020-03-24 NOTE — Progress Notes (Signed)
Echocardiogram 2D Echocardiogram has been performed.  Oneal Deputy Kal Chait 03/24/2020, 10:58 AM

## 2020-03-24 NOTE — Progress Notes (Signed)
Pt arrived to 4NP from the PACU @2130 . Pt A&O x2. Disoriented to time and place. VS stable. Foley Intact. Drainage on left hip dressing marked. Pt on 2L of O2. Pt oriented to unit. Call bell in reach. Will continue to monitor.

## 2020-03-24 NOTE — Progress Notes (Addendum)
PROGRESS NOTE    Chelsea Jordan  DZH:[id number] DOB: May 15, 1927 DOA: 03/23/2020 PCP: Virgie Dad, MD   Brief Narrative: 86 year old with past medical history significant for advanced dementia, chronic ambulatory dysfunction, hypertension, GIST tumor, aortic aneurysmal, hyperlipidemia who presents after mechanical fall and left hip pain.  Patient lives at assisted living facility and uses a cane to assist with ambulation.  The morning of admission patient tripped over and fell on the left hip.  Patient was found to have acute left side intertrochanteric fracture commuted. Patient underwent open treatment of left hip intertrochanteric fracture with intramedullary nail on 03/23/2020 by Dr. Doran Durand.     Assessment & Plan:   Active Problems:   Hip fracture, left, closed, initial encounter ([place])   Hip fracture ([place])  1-Left intertrochanteric comminuted fracture: -Underwent  -Lovenox for DVT prophylaxis -Pain management. -PT recommends SNF  2- A fib; HR controlled. Bradycardia.  Started on aspirin.  Not on rate controlled.  Echo pending.  3-HTN; SBP soft, today.  Will change hydralazine to PRN.   4-Peripheral edema TED hose ordered.  Unable to determine Diastolic HF. ECHO inconclusive . No hypoxemia.   5-Advanced Dementia;  Continue with Namenda, Seroquel, Remeron  6-Acute blood loss anemia post surgery, expected.  Continue to monitor hemoglobin. Patient appears to be asymptomatic.   Estimated body mass index is 25.58 kg/m as calculated from the following:   Height as of this encounter: 5' (1.524 m).   Weight as of this encounter: 59.4 kg.   DVT prophylaxis: Lovenox.  Code Status: DNR Family Communication: Disposition Plan:  Status is: Inpatient  Remains inpatient appropriate because:Ongoing active pain requiring inpatient pain management   Dispo: The patient is from: Home              Anticipated d/c is to: SNF              Anticipated d/c date is: 2 days               Patient currently is not medically stable to d/c.        Consultants:   Ortho   Procedures:   Open treatment of left hip intertrochanteric fracture with intramedullary nail 7/18  Antimicrobials:    Subjective: Patient is alert pleasantly confused, she is following commands.  She reports pain is controlled.  Objective: Vitals:   03/23/20 2230 03/23/20 2236 03/23/20 2325 03/24/20 0333  BP: (!) 101/57 102/61 97/71 106/66  Pulse: 66 (!) 59 63 60  Resp: 16 18 19 19   Temp: 98.3 F (36.8 C) 98.3 F (36.8 C) 98.4 F (36.9 C) 97.8 F (36.6 C)  TempSrc: Oral Oral Oral Oral  SpO2: 100% 100% 100% 97%  Weight:      Height:        Intake/Output Summary (Last 24 hours) at 03/24/2020 0743 Last data filed at 03/24/2020 0336 Gross per 24 hour  Intake 880 ml  Output 600 ml  Net 280 ml   Filed Weights   03/23/20 2200  Weight: 59.4 kg    Examination:  General exam: Appears calm and comfortable  Respiratory system: Clear to auscultation. Respiratory effort normal. Cardiovascular system: S1 & S2 heard, RRR. No JVD, murmurs, rubs, gallops or clicks. No pedal edema. Gastrointestinal system: Abdomen is nondistended, soft and nontender. No organomegaly or masses felt. Normal bowel sounds heard. Central nervous system: Alert and oriented. No focal neurological deficits. Extremities: Left hip with clean dressing, small amount of blood in the dressing.  Data Reviewed: I have personally reviewed following labs and imaging studies  CBC: Recent Labs  Lab 03/23/20 1145 03/24/20 0359  WBC 9.2 12.8*  NEUTROABS 7.7  --   HGB 12.4 9.7*  HCT 40.3 31.5*  MCV 96.4 95.2  PLT 150 756*   Basic Metabolic Panel: Recent Labs  Lab 03/23/20 1145 03/23/20 2213 03/24/20 0359  NA 141  --  140  K 3.9  --  4.0  CL 108  --  104  CO2 25  --  27  GLUCOSE 130*  --  177*  BUN 15  --  17  CREATININE 0.68  --  0.73  CALCIUM 8.9  --  8.4*  MG  --  1.8  --   PHOS  --  5.0*  --     GFR: Estimated Creatinine Clearance: 36.9 mL/min (by C-G formula based on SCr of 0.73 mg/dL). Liver Function Tests: No results for input(s): AST, ALT, ALKPHOS, BILITOT, PROT, ALBUMIN in the last 168 hours. No results for input(s): LIPASE, AMYLASE in the last 168 hours. No results for input(s): AMMONIA in the last 168 hours. Coagulation Profile: Recent Labs  Lab 03/23/20 1145  INR 1.1   Cardiac Enzymes: No results for input(s): CKTOTAL, CKMB, CKMBINDEX, TROPONINI in the last 168 hours. BNP (last 3 results) No results for input(s): PROBNP in the last 8760 hours. HbA1C: No results for input(s): HGBA1C in the last 72 hours. CBG: No results for input(s): GLUCAP in the last 168 hours. Lipid Profile: No results for input(s): CHOL, HDL, LDLCALC, TRIG, CHOLHDL, LDLDIRECT in the last 72 hours. Thyroid Function Tests: Recent Labs    03/23/20 2213  TSH 1.998   Anemia Panel: No results for input(s): VITAMINB12, FOLATE, FERRITIN, TIBC, IRON, RETICCTPCT in the last 72 hours. Sepsis Labs: No results for input(s): PROCALCITON, LATICACIDVEN in the last 168 hours.  Recent Results (from the past 240 hour(s))  SARS Coronavirus 2 by RT PCR (hospital order, performed in [facility] hospital lab) Nasopharyngeal Nasopharyngeal Swab     Status: None   Collection Time: 03/23/20 12:10 PM   Specimen: Nasopharyngeal Swab  Result Value Ref Range Status   SARS Coronavirus 2 NEGATIVE NEGATIVE Final    Comment: (NOTE) SARS-CoV-2 target nucleic acids are NOT DETECTED.  The SARS-CoV-2 RNA is generally detectable in upper and lower respiratory specimens during the acute phase of infection. The lowest concentration of SARS-CoV-2 viral copies this assay can detect is 250 copies / mL. A negative result does not preclude SARS-CoV-2 infection and should not be used as the sole basis for treatment or other patient management decisions.  A negative result may occur with improper specimen collection /  handling, submission of specimen other than nasopharyngeal swab, presence of viral mutation(s) within the areas targeted by this assay, and inadequate number of viral copies (<250 copies / mL). A negative result must be combined with clinical observations, patient history, and epidemiological information.  Fact Sheet for Patients:   StrictlyIdeas.no  Fact Sheet for Healthcare Providers: BankingDealers.co.za  This test is not yet approved or  cleared by the [place] FDA and has been authorized for detection and/or diagnosis of SARS-CoV-2 by FDA under an Emergency Use Authorization (EUA).  This EUA will remain in effect (meaning this test can be used) for the duration of the COVID-19 declaration under Section 564(b)(1) of the Act, 21 U.S.C. section 360bbb-3(b)(1), unless the authorization is terminated or revoked sooner.  Performed at [facility], [place] [address],  [place], [place] [address]   MRSA PCR Screening     Status: None   Collection Time: 03/23/20 10:21 PM   Specimen: Nasal Mucosa; Nasopharyngeal  Result Value Ref Range Status   MRSA by PCR NEGATIVE NEGATIVE Final    Comment:        The GeneXpert MRSA Assay (FDA approved for NASAL specimens only), is one component of a comprehensive MRSA colonization surveillance program. It is not intended to diagnose MRSA infection nor to guide or monitor treatment for MRSA infections. Performed at [facility], [place] [address], [place], [place] [address]          Radiology Studies: DG Chest 1 View  Result Date: 03/23/2020 CLINICAL DATA:  [place] this morning. EXAM: CHEST  1 VIEW COMPARISON:  04/17/2015 FINDINGS: Interval enlargement of the cardiac silhouette with increased prominence of the pulmonary vasculature and interstitial markings. Diffuse osteopenia is again demonstrated as well as mild moderate thoracolumbar scoliosis and degenerative changes. IMPRESSION:  Interval cardiomegaly and mild changes of congestive heart failure. Electronically Signed   By: Claudie Revering M.D.   On: 03/23/2020 12:29   DG C-Arm 1-60 Min  Result Date: 03/23/2020 CLINICAL DATA:  Left IM nail placement. EXAM: DG C-ARM 1-60 MIN; LEFT FEMUR 2 VIEWS FLUOROSCOPY TIME:  Fluoroscopy Time:  46 seconds COMPARISON:  March 23, 2020 FINDINGS: Intraoperative fluoroscopic images from IM nail and screw fixation of left sub trochanteric femoral fracture fixation demonstrate placement of orthopedic hardware with normal alignment and near anatomic osseous alignment. IMPRESSION: Intraoperative fluoroscopic images from IM nail and screw fixation of left sub trochanteric femoral fracture fixation. Electronically Signed   By: Fidela Salisbury M.D.   On: 03/23/2020 19:05   DG Hip Unilat With Pelvis 2-3 Views Left  Result Date: 03/23/2020 CLINICAL DATA:  Left hip pain following a fall this morning. EXAM: DG HIP (WITH OR WITHOUT PELVIS) 2-3V LEFT COMPARISON:  11/04/2017. FINDINGS: Acute, comminuted left intertrochanteric fracture with varus angulation. Interval healing of the previously demonstrated left superior and inferior pubic ramus fractures. Atheromatous arterial calcifications. Diffuse osteopenia. IMPRESSION: Acute, comminuted left intertrochanteric fracture with varus angulation. Electronically Signed   By: Claudie Revering M.D.   On: 03/23/2020 12:27   DG FEMUR MIN 2 VIEWS LEFT  Result Date: 03/23/2020 CLINICAL DATA:  Left IM nail placement. EXAM: DG C-ARM 1-60 MIN; LEFT FEMUR 2 VIEWS FLUOROSCOPY TIME:  Fluoroscopy Time:  46 seconds COMPARISON:  March 23, 2020 FINDINGS: Intraoperative fluoroscopic images from IM nail and screw fixation of left sub trochanteric femoral fracture fixation demonstrate placement of orthopedic hardware with normal alignment and near anatomic osseous alignment. IMPRESSION: Intraoperative fluoroscopic images from IM nail and screw fixation of left sub trochanteric femoral  fracture fixation. Electronically Signed   By: Fidela Salisbury M.D.   On: 03/23/2020 19:05        Scheduled Meds: . aspirin EC  81 mg Oral Daily  . docusate sodium  100 mg Oral BID  . enoxaparin (LOVENOX) injection  40 mg Subcutaneous Q24H  . furosemide  20 mg Intravenous Daily  . hydrALAZINE  10 mg Oral Q6H  . melatonin  5 mg Oral QHS  . memantine  10 mg Oral BID  . mirtazapine  7.5 mg Oral QHS  . naproxen  250 mg Oral BID WC  . pantoprazole  40 mg Oral Daily  . senna  1 tablet Oral BID  . simvastatin  20 mg Oral q1800   Continuous Infusions: . dextrose 5 % and  0.45 % NaCl with KCl 20 mEq/L 50 mL/hr at 03/23/20 2251     LOS: 1 day    Time spent: 35 Minutes.     Elmarie Shiley, MD [facility]   If 7PM-7AM, please contact night-coverage [email or web address]  03/24/2020, 7:43 AM

## 2020-03-24 NOTE — Progress Notes (Signed)
Initial Nutrition Assessment  DOCUMENTATION CODES:   Not applicable  INTERVENTION:  Provide Ensure Enlive po once daily, each supplement provides 350 kcal and 20 grams of protein  Encourage adequate PO intake.   NUTRITION DIAGNOSIS:   Increased nutrient needs related to post-op healing as evidenced by estimated needs.  GOAL:   Patient will meet greater than or equal to 90% of their needs  MONITOR:   PO intake, Weight trends, Supplement acceptance, Skin, Labs, I & O's  REASON FOR ASSESSMENT:   Consult Assessment of nutrition requirement/status  ASSESSMENT:   86 y.o. female with medical history significant of advanced dementia, chronic ambulatory dysfunction, hypertension, GIST tumor, aortic aneurysm, HLD, presented with mechanical fall and left hip pain. Pt with Acute left-sided intertrochanteric fracture comminuted.   Procedure(7/18): Open treatment of left hip intertrochanteric fracture with intramedullary nail  Meal completion has been 90-100%. Pt reports appetite has been fine with no abdominal discomfort currently. Pt with dementia and confused. Pt unable to appropriate answer RD questions asked. No family at bedside. RD unable to obtain pt nutrition history. Pt with no weight loss per weight records. RD to order nutritional supplements to aid in caloric and protein needs. Labs and medications reviewed.   NUTRITION - FOCUSED PHYSICAL EXAM:    Most Recent Value  Orbital Region Unable to assess  Upper Arm Region Unable to assess  Thoracic and Lumbar Region No depletion  Buccal Region Unable to assess  Temple Region Unable to assess  Clavicle Bone Region Moderate depletion  Clavicle and Acromion Bone Region Moderate depletion  Scapular Bone Region Unable to assess  Dorsal Hand Unable to assess  Patellar Region No depletion  Anterior Thigh Region No depletion  Posterior Calf Region No depletion  Edema (RD Assessment) Mild  Hair Reviewed  Eyes Reviewed  Mouth  Reviewed  Skin Reviewed  Nails Reviewed       Diet Order:   Diet Order            Diet regular Room service appropriate? Yes; Fluid consistency: Thin  Diet effective now                 EDUCATION NEEDS:   Not appropriate for education at this time  Skin:  Skin Assessment: Skin Integrity Issues: Skin Integrity Issues:: Incisions Incisions: L hip  Last BM:  Unknown  Height:   Ht Readings from Last 1 Encounters:  03/23/20 5' (1.524 m)    Weight:   Wt Readings from Last 1 Encounters:  03/23/20 59.4 kg   BMI:  Body mass index is 25.58 kg/m.  Estimated Nutritional Needs:   Kcal:  1500-1700  Protein:  75-85 grams  Fluid:  >/= 1.5 L/day  Corrin Parker, MS, RD, LDN RD pager number/after hours weekend pager number on Amion.

## 2020-03-24 NOTE — Progress Notes (Addendum)
Subjective: 1 Day Post-Op Procedure(s) (LRB): INTRAMEDULLARY (IM) NAIL INTERTROCHANTRIC HIP (Left)  Patient resting comfortably in bed. Denies fever, chills, N/V, CP, SOB.  Disoriented to time and place.    Objective:   VITALS:  Temp:  [97.5 F (36.4 C)-98.6 F (37 C)] 97.8 F (36.6 C) (07/19 0333) Pulse Rate:  [51-71] 60 (07/19 0333) Resp:  [12-25] 19 (07/19 0333) BP: (97-185)/(57-115) 106/66 (07/19 0333) SpO2:  [93 %-100 %] 97 % (07/19 0333) Weight:  [59.4 kg] 59.4 kg (07/18 2200)  General: WDWN patient in NAD. Psych:  Appropriate mood and affect. Neuro:  A&O x 1, Moving all extremities, sensation intact to light touch HEENT:  EOMs intact Chest:  Even non-labored respirations Skin:  Dressing C/D/I, no rashes or lesions Extremities: warm/dry, mild edema to L hip, no erythema or echymosis.  No lymphadenopathy. Pulses: Popliteus 2+ MSK:  ROM: TKE, MMT: able to perform quad set, (-) Homan's    LABS Recent Labs    03/23/20 1145 03/24/20 0359  HGB 12.4 9.7*  WBC 9.2 12.8*  PLT 150 149*   Recent Labs    03/23/20 1145 03/24/20 0359  NA 141 140  K 3.9 4.0  CL 108 104  CO2 25 27  BUN 15 17  CREATININE 0.68 0.73  GLUCOSE 130* 177*   Recent Labs    03/23/20 1145  INR 1.1     Assessment/Plan: 1 Day Post-Op Procedure(s) (LRB): INTRAMEDULLARY (IM) NAIL INTERTROCHANTRIC HIP (Left)  -WBAT L LE -Up with therapy -DVT ppx: lovenox in house; 81mg  ASA BID up D/C -After searching the PMP aware database the patient is provided a Rx for oxycodone. -D/C scripts on chart -Plan for 2 week outpatient post-op visit with Dr. Doran Durand.   Mechele Claude PA-C [facility] Office:  [phone]

## 2020-03-24 NOTE — Evaluation (Addendum)
Occupational Therapy Evaluation Patient Details Name: Chelsea Jordan MRN: [id number] DOB: 1926-10-14 Today's Date: 03/24/2020    History of Present Illness Pt is a 86 y.o. female with PMH of HTN, mitral valve disorder, asthma, GIST, eustachian tube dysfunction, dementia, osteoporosis, HLD, macular degeneration, GERD, anxiety, and L oclecranon fx. Presenting to ED after mechanical fall with L hip displacement and comminuted intertrochanteric fx. S/p Open treatment of left hip intertrochanteric fracture with intramedullary nail on 7/18.   Clinical Impression   PTA pt living in assisted living at [facility]. Pt with history of dementia and limited historian with increased confusion - unable to obtain PLOF. Pt was admitted for above and treated for problem list below (see OT Problem List). Pt presenting with increased confusion and able to follow 1-step commands inconsistently needing multimodal cues for redirection. Requires Min A-Total A with max multimodal cues for redirection with ADLs due to decreased cognition, generalized weakness, and pain. Pt transitions to EOB with Max A and fair sitting balance. Deferred sit<>stand due to pt pain and cognition - to be further assessed next session. Pt reporting she had to urinate and attempting to pull out catheter during session. Believe pt would benefit from skilled OT services acutely and at the SNF level to return to PLOF and increase participation in ADLs.  Vitals: BP in supine: 100/56 BP sitting EOB: 106/66    Follow Up Recommendations  SNF;Supervision/Assistance - 24 hour    Equipment Recommendations   (TBD at next venue of care)       Precautions / Restrictions Precautions Precautions: Fall Restrictions Weight Bearing Restrictions: Yes LLE Weight Bearing: Weight bearing as tolerated      Mobility Bed Mobility Overal bed mobility: Needs Assistance Bed Mobility: Rolling;Supine to Sit;Sit to Supine Rolling: Max assist   Supine to  sit: HOB elevated;Max assist Sit to supine: Total assist;HOB elevated   General bed mobility comments: Pt with max cues for sequencing and increased pain with movement. Max A for elevating trunk in supine<>sit and Total A elevating legs and lowering trunk in sit<>supine  Transfers                 General transfer comment: deferred due to pt pain and cognition    Balance Overall balance assessment: Needs assistance Sitting-balance support: Single extremity supported;Feet supported Sitting balance-Leahy Scale: Fair Sitting balance - Comments: Pt holding on to bed rail for support                                   ADL either performed or assessed with clinical judgement   ADL Overall ADL's : Needs assistance/impaired Eating/Feeding: Min A;Bed level   Grooming: Minimal assistance;Bed level;Cueing for sequencing Grooming Details (indicate cue type and reason): Min A with cueing at bed level Upper Body Bathing: Sitting;Cueing for sequencing;Maximal assistance Upper Body Bathing Details (indicate cue type and reason): requires max A for cognition and nultimodal cueing Lower Body Bathing: Bed level;Total assistance Lower Body Bathing Details (indicate cue type and reason): Total A due to cognition Upper Body Dressing : Sitting;Cueing for sequencing;Maximal assistance Upper Body Dressing Details (indicate cue type and reason): Max A with cueing for sequencing Lower Body Dressing: Total assistance;Bed level Lower Body Dressing Details (indicate cue type and reason): total A at be level   Toilet Transfer Details (indicate cue type and reason): deferred due to pain Toileting- Clothing Manipulation and Hygiene: Bed level;Total assistance  Toileting - Clothing Manipulation Details (indicate cue type and reason): total A at bed level   Tub/Shower Transfer Details (indicate cue type and reason): deferred due to pain Functional mobility during ADLs:  (deferred) General ADL  Comments: Pt with increased pain, generalized weakness, and impaired cognition                  Pertinent Vitals/Pain Pain Assessment: Faces Faces Pain Scale: Hurts even more Pain Location: L hip Pain Descriptors / Indicators: Discomfort;Grimacing;Guarding;Operative site guarding Pain Intervention(s): Monitored during session;Limited activity within patient's tolerance     Hand Dominance Right   Extremity/Trunk Assessment Upper Extremity Assessment Upper Extremity Assessment: Generalized weakness   Lower Extremity Assessment Lower Extremity Assessment: Defer to PT evaluation       Communication Communication Communication: HOH;Other (comment);Receptive difficulties;Expressive difficulties (history of dementia)   Cognition Arousal/Alertness: Awake/alert Behavior During Therapy: Restless Overall Cognitive Status: History of cognitive impairments - at baseline                                 General Comments: Pt with PMH of dementia   General Comments  Pt confused and inconsistently followed one step commands.             Home Living Family/patient expects to be discharged to:: Assisted living                             Home Equipment: Other (comment) (unable to obtain information on PLOF)   Additional Comments: [facility]      Prior Functioning/Environment Level of Independence: Needs assistance        Comments: Pt limited historian unable to obtain information on PLOF - Living in assisted living per chart review        OT Problem List: Decreased strength;Impaired balance (sitting and/or standing);Decreased cognition;Decreased safety awareness;Decreased knowledge of use of DME or AE;Decreased knowledge of precautions;Pain      OT Treatment/Interventions: Self-care/ADL training;Therapeutic exercise;Energy conservation;DME and/or AE instruction;Therapeutic activities;Cognitive remediation/compensation;Patient/family  education;Balance training    OT Goals(Current goals can be found in the care plan section) Acute Rehab OT Goals Patient Stated Goal: none stated OT Goal Formulation: With patient Time For Goal Achievement: 04/07/20 Potential to Achieve Goals: Good  OT Frequency: Min 2X/week    AM-PAC OT "6 Clicks" Daily Activity     Outcome Measure Help from another person eating meals?: A Little Help from another person taking care of personal grooming?: A Little Help from another person toileting, which includes using toliet, bedpan, or urinal?: Total Help from another person bathing (including washing, rinsing, drying)?: Total Help from another person to put on and taking off regular upper body clothing?: A Lot Help from another person to put on and taking off regular lower body clothing?: Total 6 Click Score: 11   End of Session Nurse Communication: Mobility status  Activity Tolerance: Patient limited by pain;Other (comment) (limite dby pt cognition) Patient left: in bed;with call bell/phone within reach;with bed alarm set  OT Visit Diagnosis: Muscle weakness (generalized) (M62.81);Pain;History of falling (Z91.81);Unsteadiness on feet (R26.81);Other abnormalities of gait and mobility (R26.89) Pain - Right/Left: Left Pain - part of body: Hip                Time: 1884-1660 OT Time Calculation (min): 28 min Charges:  OT General Charges $OT Visit: 1 Visit OT Evaluation $OT Eval Moderate  Complexity: 1 Mod OT Treatments $Self Care/Home Management : 8-22 mins  Amire Gossen/OTS  Elaine Middleton 03/24/2020, 9:51 AM

## 2020-03-24 NOTE — Evaluation (Signed)
Physical Therapy Evaluation Patient Details Name: Chelsea Jordan MRN: [id number] DOB: 1926/09/19 Today's Date: 03/24/2020   History of Present Illness  Pt is a 86 y.o. female with PMH of HTN, mitral valve disorder, asthma, GIST, eustachian tube dysfunction, dementia, osteoporosis, HLD, macular degeneration, GERD, anxiety, and L oclecranon fx. Presenting to ED after mechanical fall with L hip displacement and comminuted intertrochanteric fx. S/p Open treatment of left hip intertrochanteric fracture with intramedullary nail on 7/18.  Clinical Impression  Patient presents with decreased mobility due to pain in L hip and limited strength/balance/ROM/activity tolerance.  She will benefit from skilled PT in the acute setting to allow maximized mobility and decreased burden of care prior to d/c to SNF level rehab.  She was evidently ambulatory in ALF setting previously, now needs mod to max A for OOB to chair via lift equipment.  PT to follow.     Follow Up Recommendations SNF    Equipment Recommendations  None recommended by PT    Recommendations for Other Services       Precautions / Restrictions Precautions Precautions: Fall Restrictions Weight Bearing Restrictions: Yes LLE Weight Bearing: Weight bearing as tolerated      Mobility  Bed Mobility Overal bed mobility: Needs Assistance Bed Mobility: Supine to Sit Rolling: Max assist   Supine to sit: HOB elevated;Mod assist;Max assist Sit to supine: HOB elevated;Total assist   General bed mobility comments: assist to bring hips toward EOB and to lift trunk, then scoot hips, pt assisting with cues and increased time and encouragement  Transfers Overall transfer level: Needs assistance   Transfers: Sit to/from Stand;Stand Pivot Transfers Sit to Stand: Mod assist;From elevated surface Stand pivot transfers: Total assist       General transfer comment: up to stedy with cues and assist after two attempts with cues for increased R  weight bearing due to pain on L; stedy used to transition to recliner  Ambulation/Gait                Stairs            Wheelchair Mobility    Modified Rankin (Stroke Patients Only)       Balance Overall balance assessment: Needs assistance Sitting-balance support: Feet supported Sitting balance-Leahy Scale: Fair Sitting balance - Comments: EOB without assist x about 8 minutes   Standing balance support: Bilateral upper extremity supported Standing balance-Leahy Scale: Poor Standing balance comment: standing to stedy then sat on seat, but felt uncomfortable to stood in squat position during transition to recliner with UE support                             Pertinent Vitals/Pain Pain Assessment: Faces Faces Pain Scale: Hurts even more Pain Location: L hip Pain Descriptors / Indicators: Discomfort;Grimacing;Guarding;Operative site guarding Pain Intervention(s): Limited activity within patient's tolerance;Repositioned    Home Living Family/patient expects to be discharged to:: Assisted living               Home Equipment: Other (comment) (pt unable to state prior level) Additional Comments: [facility]    Prior Function Level of Independence: Needs assistance         Comments: Pt limited historian unable to obtain information on PLOF - Living in assisted living per chart review     Hand Dominance   Dominant Hand: Right    Extremity/Trunk Assessment   Upper Extremity Assessment Upper Extremity Assessment: Defer to OT  evaluation    Lower Extremity Assessment Lower Extremity Assessment: LLE deficits/detail LLE Deficits / Details: AAROM limited due to pain, strength at least 3-/5    Cervical / Trunk Assessment Cervical / Trunk Assessment: Kyphotic  Communication   Communication: HOH  Cognition Arousal/Alertness: Awake/alert Behavior During Therapy: WFL for tasks assessed/performed Overall Cognitive Status: No  family/caregiver present to determine baseline cognitive functioning                                 General Comments: Pt with PMH of dementia      General Comments General comments (skin integrity, edema, etc.): c/o sore throat, assisted to set up meal tray, NT informed of transfer status    Exercises General Exercises - Lower Extremity Heel Slides: AROM;AAROM;5 reps;Both;Supine   Assessment/Plan    PT Assessment Patient needs continued PT services  PT Problem List Decreased strength;Decreased mobility;Decreased range of motion;Decreased activity tolerance;Decreased balance;Decreased knowledge of use of DME;Pain       PT Treatment Interventions DME instruction;Therapeutic activities;Gait training;Therapeutic exercise;Patient/family education;Balance training;Functional mobility training    PT Goals (Current goals can be found in the Care Plan section)  Acute Rehab PT Goals Patient Stated Goal: none stated PT Goal Formulation: Patient unable to participate in goal setting Time For Goal Achievement: 04/07/20 Potential to Achieve Goals: Fair    Frequency Min 2X/week   Barriers to discharge        Co-evaluation               AM-PAC PT "6 Clicks" Mobility  Outcome Measure Help needed turning from your back to your side while in a flat bed without using bedrails?: A Lot Help needed moving from lying on your back to sitting on the side of a flat bed without using bedrails?: A Lot Help needed moving to and from a bed to a chair (including a wheelchair)?: Total Help needed standing up from a chair using your arms (e.g., wheelchair or bedside chair)?: A Lot Help needed to walk in hospital room?: Total Help needed climbing 3-5 steps with a railing? : Total 6 Click Score: 9    End of Session Equipment Utilized During Treatment: Gait belt Activity Tolerance: Patient limited by pain Patient left: in chair;with call bell/phone within reach;with chair alarm  set Nurse Communication: Mobility status PT Visit Diagnosis: Other abnormalities of gait and mobility (R26.89);History of falling (Z91.81);Difficulty in walking, not elsewhere classified (R26.2);Pain Pain - Right/Left: Left Pain - part of body: Hip    Time: 9326-7124 PT Time Calculation (min) (ACUTE ONLY): 24 min   Charges:   PT Evaluation $PT Eval Moderate Complexity: 1 Mod PT Treatments $Therapeutic Activity: 8-22 mins        Magda Kiel, PT Acute Rehabilitation Services PYKDX:[phone] Office:[phone] 03/24/2020   Reginia Naas 03/24/2020, 12:45 PM

## 2020-03-24 NOTE — Plan of Care (Signed)
  Problem: Education: Goal: Knowledge of General Education information will improve Description: Including pain rating scale, medication(s)/side effects and non-pharmacologic comfort measures Outcome: Progressing   Problem: Health Behavior/Discharge Planning: Goal: Ability to manage health-related needs will improve Outcome: Progressing   Problem: Clinical Measurements: Goal: Will remain free from infection Outcome: Progressing  Foley removed after PT assessment.

## 2020-03-25 ENCOUNTER — Encounter: Payer: Self-pay | Admitting: Nurse Practitioner

## 2020-03-25 DIAGNOSIS — W19XXXA Unspecified fall, initial encounter: Secondary | ICD-10-CM | POA: Insufficient documentation

## 2020-03-25 LAB — CBC
HCT: 24.7 % — ABNORMAL LOW (ref 36.0–46.0)
HCT: 23.7 % — ABNORMAL LOW (ref 36.0–46.0)
Hemoglobin: 7.8 g/dL — ABNORMAL LOW (ref 12.0–15.0)
Hemoglobin: 7.4 g/dL — ABNORMAL LOW (ref 12.0–15.0)
MCH: 30 pg (ref 26.0–34.0)
MCH: 29.8 pg (ref 26.0–34.0)
MCHC: 31.6 g/dL (ref 30.0–36.0)
MCHC: 31.2 g/dL (ref 30.0–36.0)
MCV: 95 fL (ref 80.0–100.0)
MCV: 95.6 fL (ref 80.0–100.0)
Platelets: 131 10*3/uL — ABNORMAL LOW (ref 150–400)
Platelets: 134 10*3/uL — ABNORMAL LOW (ref 150–400)
RBC: 2.6 MIL/uL — ABNORMAL LOW (ref 3.87–5.11)
RBC: 2.48 MIL/uL — ABNORMAL LOW (ref 3.87–5.11)
RDW: 13.6 % (ref 11.5–15.5)
RDW: 13.6 % (ref 11.5–15.5)
WBC: 12.7 10*3/uL — ABNORMAL HIGH (ref 4.0–10.5)
WBC: 13.2 10*3/uL — ABNORMAL HIGH (ref 4.0–10.5)
nRBC: 0 % (ref 0.0–0.2)
nRBC: 0 % (ref 0.0–0.2)

## 2020-03-25 LAB — BASIC METABOLIC PANEL
Anion gap: 8 (ref 5–15)
BUN: 23 mg/dL (ref 8–23)
CO2: 28 mmol/L (ref 22–32)
Calcium: 8.9 mg/dL (ref 8.9–10.3)
Chloride: 105 mmol/L (ref 98–111)
Creatinine, Ser: 0.64 mg/dL (ref 0.44–1.00)
GFR calc Af Amer: >60 mL/min (ref >60)
GFR calc non Af Amer: >60 mL/min (ref >60)
Glucose, Bld: 116 mg/dL — ABNORMAL HIGH (ref 70–99)
Potassium: 4.1 mmol/L (ref 3.5–5.1)
Sodium: 141 mmol/L (ref 135–145)

## 2020-03-25 LAB — VITAMIN B12: Vitamin B-12: 325 pg/mL (ref 180–914)

## 2020-03-25 MED ORDER — FERROUS SULFATE 325 (65 FE) MG PO TABS
325.0000 mg | ORAL_TABLET | Freq: Every day | ORAL | Status: DC
  Administered 2020-03-25 – 2020-03-29 (×5): 325 mg via ORAL
  Filled 2020-03-25 (×5): qty 1

## 2020-03-25 NOTE — Progress Notes (Addendum)
PROGRESS NOTE    Chelsea Jordan  QVZ:[id number] DOB: October 08, 1926 DOA: 03/23/2020 PCP: Virgie Dad, MD   Brief Narrative: 86 year old with past medical history significant for advanced dementia, chronic ambulatory dysfunction, hypertension, GIST tumor, aortic aneurysmal, hyperlipidemia who presents after mechanical fall and left hip pain.  Patient lives at assisted living facility and uses a cane to assist with ambulation.  The morning of admission patient tripped over and fell on the left hip.  Patient was found to have acute left side intertrochanteric fracture commuted.  Patient underwent open treatment of left hip intertrochanteric fracture with intramedullary nail on 03/23/2020 by Dr. Doran Durand.  Post operative course complicated by acute blood loss anemia post surgery. Plan is to repeat cbc this afternoon. If hb decreasing , patient might need one unit PRBC>    Assessment & Plan:   Active Problems:   Hip fracture, left, closed, initial encounter ([place])   Hip fracture ([place])  1-Left intertrochanteric comminuted fracture: -Underwent open treatment of left hip intertrochanteric fracture with intramedullary nail by Dr. Doran Durand.  -Lovenox for DVT prophylaxis -Pain management. -PT recommends SNF  2- A fib; HR controlled. Bradycardia.  Started on aspirin.  Not on rate controlled.  ECHO; ejection fraction 55 to 60%.  The left ventricle has normal function tricuspid valve with mild to moderate rehabilitation.  3-HTN; SBP soft, monitor blood pressure trend. Will change hydralazine to PRN.   4-Peripheral edema TED hose ordered.   5-Advanced Dementia;  Continue with Namenda, Seroquel, Remeron Check B 12  6-Acute blood loss anemia post surgery, expected.  Continue to monitor hemoglobin. Patient appears to be asymptomatic. Hemoglobin continues to decrease from 12 on admission to 9---7. Plan to repeat hemoglobin this afternoon if lower plan to proceed with 1 unit of packed red blood  cell. We will start ferrous sulfate  Estimated body mass index is 25.58 kg/m as calculated from the following:   Height as of this encounter: 5' (1.524 m).   Weight as of this encounter: 59.4 kg.   DVT prophylaxis: Lovenox.  Code Status: DNR Family Communication: sister updated at bedside.  Disposition Plan:  Status is: Inpatient  Remains inpatient appropriate because:Ongoing active pain requiring inpatient pain management   Dispo: The patient is from: Home              Anticipated d/c is to: SNF              Anticipated d/c date is: 2 days              Patient currently is not medically stable to d/c.  Awaiting stabilization of blood count to be able to discharge to skilled nursing facility        Consultants:   Ortho   Procedures:   Open treatment of left hip intertrochanteric fracture with intramedullary nail 7/18  Antimicrobials:    Subjective: Patient denies shortness of breath, weakness, chest pain. Pleasantly confused   Objective: Vitals:   03/25/20 0009 03/25/20 0403 03/25/20 0735 03/25/20 1130  BP: 109/60 122/62 137/71 (!) 142/74  Pulse: (!) 59 (!) 54 66 62  Resp:  20 20 19   Temp: 99.2 F (37.3 C) 99.7 F (37.6 C) 98.7 F (37.1 C) 97.9 F (36.6 C)  TempSrc:  Oral Axillary Oral  SpO2: 94% 96% 97% 99%  Weight:      Height:        Intake/Output Summary (Last 24 hours) at 03/25/2020 1405 Last data filed at 03/25/2020 1342 Gross per 24  hour  Intake 1399.81 ml  Output 400 ml  Net 999.81 ml   Filed Weights   03/23/20 2200  Weight: 59.4 kg    Examination:  General exam: No acute distress Respiratory system: Clear to auscultation Cardiovascular system: S1, S2, regular rhythm or rate Gastrointestinal system: Bowel sounds present, soft nontender nondistended Central nervous system: Alert, oriented to person, pleasantly confused Extremities: Left hip with clean dressing, small amount of blood in the dressing.    Data Reviewed: I have  personally reviewed following labs and imaging studies  CBC: Recent Labs  Lab 03/23/20 1145 03/24/20 0359 03/25/20 0606  WBC 9.2 12.8* 12.7*  NEUTROABS 7.7  --   --   HGB 12.4 9.7* 7.8*  HCT 40.3 31.5* 24.7*  MCV 96.4 95.2 95.0  PLT 150 149* 102*   Basic Metabolic Panel: Recent Labs  Lab 03/23/20 1145 03/23/20 2213 03/24/20 0359 03/25/20 0606  NA 141  --  140 141  K 3.9  --  4.0 4.1  CL 108  --  104 105  CO2 25  --  27 28  GLUCOSE 130*  --  177* 116*  BUN 15  --  17 23  CREATININE 0.68  --  0.73 0.64  CALCIUM 8.9  --  8.4* 8.9  MG  --  1.8  --   --   PHOS  --  5.0*  --   --    GFR: Estimated Creatinine Clearance: 36.9 mL/min (by C-G formula based on SCr of 0.64 mg/dL). Liver Function Tests: No results for input(s): AST, ALT, ALKPHOS, BILITOT, PROT, ALBUMIN in the last 168 hours. No results for input(s): LIPASE, AMYLASE in the last 168 hours. No results for input(s): AMMONIA in the last 168 hours. Coagulation Profile: Recent Labs  Lab 03/23/20 1145  INR 1.1   Cardiac Enzymes: No results for input(s): CKTOTAL, CKMB, CKMBINDEX, TROPONINI in the last 168 hours. BNP (last 3 results) No results for input(s): PROBNP in the last 8760 hours. HbA1C: No results for input(s): HGBA1C in the last 72 hours. CBG: No results for input(s): GLUCAP in the last 168 hours. Lipid Profile: No results for input(s): CHOL, HDL, LDLCALC, TRIG, CHOLHDL, LDLDIRECT in the last 72 hours. Thyroid Function Tests: Recent Labs    03/23/20 2213  TSH 1.998   Anemia Panel: No results for input(s): VITAMINB12, FOLATE, FERRITIN, TIBC, IRON, RETICCTPCT in the last 72 hours. Sepsis Labs: No results for input(s): PROCALCITON, LATICACIDVEN in the last 168 hours.  Recent Results (from the past 240 hour(s))  SARS Coronavirus 2 by RT PCR (hospital order, performed in [facility] hospital lab) Nasopharyngeal Nasopharyngeal Swab     Status: None   Collection Time: 03/23/20 12:10 PM   Specimen:  Nasopharyngeal Swab  Result Value Ref Range Status   SARS Coronavirus 2 NEGATIVE NEGATIVE Final    Comment: (NOTE) SARS-CoV-2 target nucleic acids are NOT DETECTED.  The SARS-CoV-2 RNA is generally detectable in upper and lower respiratory specimens during the acute phase of infection. The lowest concentration of SARS-CoV-2 viral copies this assay can detect is 250 copies / mL. A negative result does not preclude SARS-CoV-2 infection and should not be used as the sole basis for treatment or other patient management decisions.  A negative result may occur with improper specimen collection / handling, submission of specimen other than nasopharyngeal swab, presence of viral mutation(s) within the areas targeted by this assay, and inadequate number of viral copies (<250 copies / mL). A negative result must  be combined with clinical observations, patient history, and epidemiological information.  Fact Sheet for Patients:   StrictlyIdeas.no  Fact Sheet for Healthcare Providers: BankingDealers.co.za  This test is not yet approved or  cleared by the [place] FDA and has been authorized for detection and/or diagnosis of SARS-CoV-2 by FDA under an Emergency Use Authorization (EUA).  This EUA will remain in effect (meaning this test can be used) for the duration of the COVID-19 declaration under Section 564(b)(1) of the Act, 21 U.S.C. section 360bbb-3(b)(1), unless the authorization is terminated or revoked sooner.  Performed at [facility], [place] [address] [place], [place] [address]   MRSA PCR Screening     Status: None   Collection Time: 03/23/20 10:21 PM   Specimen: Nasal Mucosa; Nasopharyngeal  Result Value Ref Range Status   MRSA by PCR NEGATIVE NEGATIVE Final    Comment:        The GeneXpert MRSA Assay (FDA approved for NASAL specimens only), is one component of a comprehensive MRSA colonization surveillance program. It  is not intended to diagnose MRSA infection nor to guide or monitor treatment for MRSA infections. Performed at [facility], [place] [address] [place], [place] [address]          Radiology Studies: DG C-Arm 1-60 Min  Result Date: 03/23/2020 CLINICAL DATA:  Left IM nail placement. EXAM: DG C-ARM 1-60 MIN; LEFT FEMUR 2 VIEWS FLUOROSCOPY TIME:  Fluoroscopy Time:  46 seconds COMPARISON:  March 23, 2020 FINDINGS: Intraoperative fluoroscopic images from IM nail and screw fixation of left sub trochanteric femoral fracture fixation demonstrate placement of orthopedic hardware with normal alignment and near anatomic osseous alignment. IMPRESSION: Intraoperative fluoroscopic images from IM nail and screw fixation of left sub trochanteric femoral fracture fixation. Electronically Signed   By: Fidela Salisbury M.D.   On: 03/23/2020 19:05   ECHOCARDIOGRAM COMPLETE  Result Date: 03/24/2020    ECHOCARDIOGRAM REPORT   Patient Name:   DELPHA PERKO Date of Exam: 03/24/2020 Medical Rec #:  [id number]     Height:       60.0 in Accession #:    [id number]    Weight:       131.0 lb Date of Birth:  May 03, 1927      BSA:          1.559 m Patient Age:    77 years      BP:           105/56 mmHg Patient Gender: F             HR:           59 bpm. Exam Location:  Inpatient Procedure: 2D Echo, Color Doppler, Cardiac Doppler and 3D Echo Indications:    R01.1 Murmur  History:        Patient has prior history of Echocardiogram examinations, most                 recent 03/16/2017. Risk Factors:Hypertension and Dyslipidemia.  Sonographer:    Raquel Sarna Senior RDCS Referring Phys: [place]ITT IMPRESSIONS  1. Left ventricular ejection fraction, by estimation, is 55 to 60%. The left ventricle has normal function. The left ventricle has no regional wall motion abnormalities. Left ventricular diastolic function could not be evaluated.  2. Right ventricular systolic function is normal. The right ventricular size is normal. There is  mildly elevated pulmonary artery systolic pressure.  3. Tracings suggest atrial flutter with variable conduction, recommend clinical correlation.Marland Kitchen Left  atrial size was severely dilated.  4. Right atrial size was moderately dilated.  5. The mitral valve is normal in structure. Mild mitral valve regurgitation. No evidence of mitral stenosis.  6. Tricuspid valve regurgitation is mild to moderate.  7. The aortic valve is tricuspid. Aortic valve regurgitation is mild. Mild aortic valve sclerosis is present, with no evidence of aortic valve stenosis.  8. Aortic dilatation noted. There is moderate dilatation of the ascending aorta measuring 45 mm.  9. The inferior vena cava is dilated in size with >50% respiratory variability, suggesting right atrial pressure of 8 mmHg. FINDINGS  Left Ventricle: Left ventricular ejection fraction, by estimation, is 55 to 60%. The left ventricle has normal function. The left ventricle has no regional wall motion abnormalities. The left ventricular internal cavity size was normal in size. There is  no left ventricular hypertrophy. Left ventricular diastolic function could not be evaluated due to atrial fibrillation. Left ventricular diastolic function could not be evaluated. Right Ventricle: The right ventricular size is normal. No increase in right ventricular wall thickness. Right ventricular systolic function is normal. There is mildly elevated pulmonary artery systolic pressure. The tricuspid regurgitant velocity is 2.77  m/s, and with an assumed right atrial pressure of 8 mmHg, the estimated right ventricular systolic pressure is 16.9 mmHg. Left Atrium: Tracings suggest atrial flutter with variable conduction, recommend clinical correlation. Left atrial size was severely dilated. Right Atrium: Right atrial size was moderately dilated. Pericardium: There is no evidence of pericardial effusion. Mitral Valve: The mitral valve is normal in structure. There is mild thickening of the mitral  valve leaflet(s). There is mild calcification of the mitral valve leaflet(s). Mild mitral annular calcification. Mild mitral valve regurgitation. No evidence of  mitral valve stenosis. Tricuspid Valve: The tricuspid valve is normal in structure. Tricuspid valve regurgitation is mild to moderate. No evidence of tricuspid stenosis. Aortic Valve: The aortic valve is tricuspid. Aortic valve regurgitation is mild. Aortic regurgitation PHT measures 989 msec. Mild aortic valve sclerosis is present, with no evidence of aortic valve stenosis. There is mild calcification of the aortic valve. Aortic valve mean gradient measures 9.0 mmHg. Aortic valve peak gradient measures 19.2 mmHg. Aortic valve area, by VTI measures 1.73 cm. Pulmonic Valve: The pulmonic valve was not well visualized. Pulmonic valve regurgitation is not visualized. Aorta: Aortic dilatation noted. There is moderate dilatation of the ascending aorta measuring 45 mm. Venous: The inferior vena cava is dilated in size with greater than 50% respiratory variability, suggesting right atrial pressure of 8 mmHg. IAS/Shunts: The atrial septum is grossly normal.  LEFT VENTRICLE PLAX 2D LVIDd:         3.50 cm  Diastology LVIDs:         2.70 cm  LV e' lateral:   6.64 cm/s LV PW:         0.90 cm  LV E/e' lateral: 15.5 LV IVS:        1.10 cm  LV e' medial:    6.85 cm/s LVOT diam:     2.00 cm  LV E/e' medial:  15.0 LV SV:         67 LV SV Index:   43 LVOT Area:     3.14 cm  RIGHT VENTRICLE RV S prime:     14.40 cm/s TAPSE (M-mode): 1.8 cm LEFT ATRIUM             Index       RIGHT ATRIUM  Index LA diam:        3.70 cm 2.37 cm/m  RA Area:     23.90 cm LA Vol (A2C):   92.8 ml 59.52 ml/m RA Volume:   70.00 ml  44.90 ml/m LA Vol (A4C):   85.9 ml 55.09 ml/m LA Biplane Vol: 90.2 ml 57.85 ml/m  AORTIC VALVE AV Area (Vmax):    1.43 cm AV Area (Vmean):   1.63 cm AV Area (VTI):     1.73 cm AV Vmax:           219.00 cm/s AV Vmean:          139.000 cm/s AV VTI:             0.386 m AV Peak Grad:      19.2 mmHg AV Mean Grad:      9.0 mmHg LVOT Vmax:         99.90 cm/s LVOT Vmean:        72.100 cm/s LVOT VTI:          0.212 m LVOT/AV VTI ratio: 0.55 AI PHT:            989 msec  AORTA Ao Root diam: 3.50 cm Ao Asc diam:  4.45 cm MITRAL VALVE                TRICUSPID VALVE MV Area (PHT): 2.93 cm     TR Peak grad:   30.7 mmHg MV Decel Time: 259 msec     TR Vmax:        277.00 cm/s MV E velocity: 103.00 cm/s MV A velocity: 25.30 cm/s   SHUNTS MV E/A ratio:  4.07         Systemic VTI:  0.21 m                             Systemic Diam: 2.00 cm Buford Dresser MD Electronically signed by Buford Dresser MD Signature Date/Time: 03/24/2020/3:53:08 PM    Final    DG FEMUR MIN 2 VIEWS LEFT  Result Date: 03/23/2020 CLINICAL DATA:  Left IM nail placement. EXAM: DG C-ARM 1-60 MIN; LEFT FEMUR 2 VIEWS FLUOROSCOPY TIME:  Fluoroscopy Time:  46 seconds COMPARISON:  March 23, 2020 FINDINGS: Intraoperative fluoroscopic images from IM nail and screw fixation of left sub trochanteric femoral fracture fixation demonstrate placement of orthopedic hardware with normal alignment and near anatomic osseous alignment. IMPRESSION: Intraoperative fluoroscopic images from IM nail and screw fixation of left sub trochanteric femoral fracture fixation. Electronically Signed   By: Fidela Salisbury M.D.   On: 03/23/2020 19:05        Scheduled Meds: . aspirin EC  81 mg Oral Daily  . docusate sodium  100 mg Oral BID  . enoxaparin (LOVENOX) injection  40 mg Subcutaneous Q24H  . feeding supplement (ENSURE ENLIVE)  237 mL Oral Q1500  . melatonin  5 mg Oral QHS  . memantine  10 mg Oral BID  . mirtazapine  7.5 mg Oral QHS  . pantoprazole  40 mg Oral Daily  . senna  1 tablet Oral BID  . simvastatin  20 mg Oral q1800   Continuous Infusions:    LOS: 2 days    Time spent: 35 Minutes.     Elmarie Shiley, MD [facility]   If 7PM-7AM, please contact  night-coverage [email or web address]  03/25/2020, 2:05 PM

## 2020-03-25 NOTE — Assessment & Plan Note (Signed)
Witnessed fall in hallway

## 2020-03-25 NOTE — Progress Notes (Signed)
This encounter was created in error - please disregard.

## 2020-03-25 NOTE — Progress Notes (Addendum)
Subjective: 2 Days Post-Op Procedure(s) (LRB): INTRAMEDULLARY (IM) NAIL INTERTROCHANTRIC HIP (Left)  Patient resting comfortably in chair.  Sister at bedside.  Sister reports that the patient is doing quite well.  Notes that the patient does not appear to be in much pain.  Objective:   VITALS:  Temp:  [97.9 F (36.6 C)-99.7 F (37.6 C)] 97.9 F (36.6 C) (07/20 1130) Pulse Rate:  [54-66] 62 (07/20 1130) Resp:  [16-20] 19 (07/20 1130) BP: (109-142)/(54-74) 142/74 (07/20 1130) SpO2:  [94 %-99 %] 99 % (07/20 1130)  General: WDWN patient in NAD. Psych: Calm mood and affect. Neuro:  A&O x 1, Moving all extremities, sensation intact to light touch HEENT:  EOMs intact Chest:  Even non-labored respirations Skin:  Dressing to L hip C/D/I, no rashes or lesions Extremities: warm/dry, mild edema to L hip, no erythema or echymosis.  No lymphadenopathy. Pulses: Popliteus 2+ MSK:  ROM: TKE, MMT: able to perform quad set, (-) Homan's    LABS Recent Labs    03/23/20 1145 03/23/20 1145 03/24/20 0359 03/25/20 0606  HGB 12.4  --  9.7* 7.8*  WBC 9.2   < > 12.8* 12.7*  PLT 150   < > 149* 131*   < > = values in this interval not displayed.   Recent Labs    03/24/20 0359 03/25/20 0606  NA 140 141  K 4.0 4.1  CL 104 105  CO2 27 28  BUN 17 23  CREATININE 0.73 0.64  GLUCOSE 177* 116*   Recent Labs    03/23/20 1145  INR 1.1     Assessment/Plan: 2 Days Post-Op Procedure(s) (LRB): INTRAMEDULLARY (IM) NAIL INTERTROCHANTRIC HIP (Left)  WBAT L LE Up with therapy DVT ppx:  Lovenox in house; 81 mg ASA BID upon D/C Disp: SNF D/C scripts on chart Plan for 2 week outpatient post-op visit with Dr. Doran Durand.  Mechele Claude PA-C [facility] Office:  [phone]

## 2020-03-26 ENCOUNTER — Encounter: Payer: Self-pay | Admitting: Internal Medicine

## 2020-03-26 DIAGNOSIS — I4891 Unspecified atrial fibrillation: Secondary | ICD-10-CM

## 2020-03-26 DIAGNOSIS — R001 Bradycardia, unspecified: Secondary | ICD-10-CM

## 2020-03-26 DIAGNOSIS — S72001A Fracture of unspecified part of neck of right femur, initial encounter for closed fracture: Secondary | ICD-10-CM

## 2020-03-26 DIAGNOSIS — I10 Essential (primary) hypertension: Secondary | ICD-10-CM

## 2020-03-26 LAB — IRON AND TIBC
Iron: 34 ug/dL (ref 28–170)
Saturation Ratios: 17 % (ref 10.4–31.8)
TIBC: 195 ug/dL — ABNORMAL LOW (ref 250–450)
UIBC: 161 ug/dL

## 2020-03-26 LAB — CBC
HCT: 24.4 % — ABNORMAL LOW (ref 36.0–46.0)
Hemoglobin: 7.7 g/dL — ABNORMAL LOW (ref 12.0–15.0)
MCH: 30.6 pg (ref 26.0–34.0)
MCHC: 31.6 g/dL (ref 30.0–36.0)
MCV: 96.8 fL (ref 80.0–100.0)
Platelets: 134 10*3/uL — ABNORMAL LOW (ref 150–400)
RBC: 2.52 MIL/uL — ABNORMAL LOW (ref 3.87–5.11)
RDW: 13.5 % (ref 11.5–15.5)
WBC: 11.8 10*3/uL — ABNORMAL HIGH (ref 4.0–10.5)
nRBC: 0 % (ref 0.0–0.2)

## 2020-03-26 LAB — RETICULOCYTES
Immature Retic Fract: 28.7 % — ABNORMAL HIGH (ref 2.3–15.9)
RBC.: 2.45 MIL/uL — ABNORMAL LOW (ref 3.87–5.11)
Retic Count, Absolute: 104.1 10*3/uL (ref 19.0–186.0)
Retic Ct Pct: 4.3 % — ABNORMAL HIGH (ref 0.4–3.1)

## 2020-03-26 LAB — COMPREHENSIVE METABOLIC PANEL
ALT: 15 U/L (ref 0–44)
AST: 18 U/L (ref 15–41)
Albumin: 2.9 g/dL — ABNORMAL LOW (ref 3.5–5.0)
Alkaline Phosphatase: 59 U/L (ref 38–126)
Anion gap: 8 (ref 5–15)
BUN: 17 mg/dL (ref 8–23)
CO2: 28 mmol/L (ref 22–32)
Calcium: 8.6 mg/dL — ABNORMAL LOW (ref 8.9–10.3)
Chloride: 104 mmol/L (ref 98–111)
Creatinine, Ser: 0.55 mg/dL (ref 0.44–1.00)
GFR calc Af Amer: >60 mL/min (ref >60)
GFR calc non Af Amer: >60 mL/min (ref >60)
Glucose, Bld: 122 mg/dL — ABNORMAL HIGH (ref 70–99)
Potassium: 3.5 mmol/L (ref 3.5–5.1)
Sodium: 140 mmol/L (ref 135–145)
Total Bilirubin: 1.1 mg/dL (ref 0.3–1.2)
Total Protein: 5 g/dL — ABNORMAL LOW (ref 6.5–8.1)

## 2020-03-26 LAB — PHOSPHORUS: Phosphorus: 2.8 mg/dL (ref 2.5–4.6)

## 2020-03-26 LAB — FERRITIN: Ferritin: 109 ng/mL (ref 11–307)

## 2020-03-26 LAB — MAGNESIUM: Magnesium: 1.8 mg/dL (ref 1.7–2.4)

## 2020-03-26 LAB — VITAMIN B12: Vitamin B-12: 314 pg/mL (ref 180–914)

## 2020-03-26 LAB — FOLATE: Folate: 11.4 ng/mL (ref >5.9)

## 2020-03-26 MED ORDER — ASCORBIC ACID 500 MG PO TABS
500.0000 mg | ORAL_TABLET | Freq: Every day | ORAL | Status: DC
  Administered 2020-03-26 – 2020-03-29 (×4): 500 mg via ORAL
  Filled 2020-03-26 (×3): qty 1

## 2020-03-26 NOTE — Progress Notes (Signed)
Physical Therapy Treatment Patient Details Name: Chelsea Jordan MRN: [id number] DOB: 22-Jun-1927 Today's Date: 03/26/2020    History of Present Illness Pt is a 86 y.o. female with PMH of HTN, mitral valve disorder, asthma, GIST, eustachian tube dysfunction, dementia, osteoporosis, HLD, macular degeneration, GERD, anxiety, and L oclecranon fx. Presenting to ED after mechanical fall with L hip displacement and comminuted intertrochanteric fx. S/p Open treatment of left hip intertrochanteric fracture with intramedullary nail on 7/18.    PT Comments    Pt tolerates treatment well with improved transfer quality and LE power. Pt does demonstrate impaired ability to tolerate WB through LLE due to pain and weakness, and is unable to clear LLE off floor during standing to improve gait quality. Pt will benefit from continued acute PT services to further improve activity tolerance and reduce falls risk. PT continues to recommend SNF placement at this time.   Follow Up Recommendations  SNF     Equipment Recommendations  None recommended by PT    Recommendations for Other Services       Precautions / Restrictions Precautions Precautions: Fall Restrictions Weight Bearing Restrictions: Yes LLE Weight Bearing: Weight bearing as tolerated    Mobility  Bed Mobility Overal bed mobility: Needs Assistance Bed Mobility: Supine to Sit;Sit to Supine     Supine to sit: Mod assist Sit to supine: Mod assist      Transfers Overall transfer level: Needs assistance Equipment used: Rolling walker (2 wheeled) Transfers: Sit to/from Stand Sit to Stand: Min assist         General transfer comment: pt requires minA to power up and to bring forward from slight posterior lean  Ambulation/Gait Ambulation/Gait assistance: Mod assist Gait Distance (Feet): 1 Feet Assistive device: Rolling walker (2 wheeled) Gait Pattern/deviations: Shuffle Gait velocity: reduced Gait velocity interpretation: <1.31  ft/sec, indicative of household ambulator General Gait Details: pt with short shuffling on R foot, unable to clear L foot and sliding slowly along ground, reduced stance time on LLE   Stairs             Wheelchair Mobility    Modified Rankin (Stroke Patients Only)       Balance Overall balance assessment: Needs assistance Sitting-balance support: Bilateral upper extremity supported;Feet supported Sitting balance-Leahy Scale: Poor Sitting balance - Comments: minG but reliant on BUE support   Standing balance support: Bilateral upper extremity supported Standing balance-Leahy Scale: Poor Standing balance comment: minA with BUE support of RW                            Cognition Arousal/Alertness: Awake/alert Behavior During Therapy: WFL for tasks assessed/performed Overall Cognitive Status: History of cognitive impairments - at baseline                                 General Comments: dementia at baseline per chart      Exercises      General Comments General comments (skin integrity, edema, etc.): VSS on 2L [place]      Pertinent Vitals/Pain Pain Assessment: Faces Faces Pain Scale: Hurts even more Pain Location: L hip Pain Descriptors / Indicators: Grimacing Pain Intervention(s): Monitored during session    Home Living                      Prior Function  PT Goals (current goals can now be found in the care plan section) Acute Rehab PT Goals Patient Stated Goal: none stated Progress towards PT goals: Progressing toward goals    Frequency    Min 2X/week      PT Plan Current plan remains appropriate    Co-evaluation              AM-PAC PT "6 Clicks" Mobility   Outcome Measure  Help needed turning from your back to your side while in a flat bed without using bedrails?: A Lot Help needed moving from lying on your back to sitting on the side of a flat bed without using bedrails?: A Lot Help needed  moving to and from a bed to a chair (including a wheelchair)?: A Lot Help needed standing up from a chair using your arms (e.g., wheelchair or bedside chair)?: A Little Help needed to walk in hospital room?: A Lot Help needed climbing 3-5 steps with a railing? : Total 6 Click Score: 12    End of Session   Activity Tolerance: Patient tolerated treatment well Patient left: in bed;with call bell/phone within reach;with bed alarm set;with restraints reapplied Nurse Communication: Mobility status PT Visit Diagnosis: Other abnormalities of gait and mobility (R26.89);History of falling (Z91.81);Difficulty in walking, not elsewhere classified (R26.2);Pain Pain - Right/Left: Left Pain - part of body: Hip     Time: 2081-3887 PT Time Calculation (min) (ACUTE ONLY): 20 min  Charges:  $Therapeutic Activity: 8-22 mins                     Zenaida Niece, PT, DPT Acute Rehabilitation Pager: [phone]    Zenaida Niece 03/26/2020, 12:37 PM

## 2020-03-26 NOTE — Progress Notes (Signed)
PROGRESS NOTE    Chelsea Jordan  HUD:[id number] DOB: 13-Jul-1927 DOA: 03/23/2020 PCP: Virgie Dad, MD     Brief Narrative:  86 year old WF PMHx Advanced Dmentia, chronic ambulatory dysfunction, HTN, GIST tumor, Aortic Aneurysmal, HLD,   Presents after mechanical fall and left hip pain.  Patient lives at assisted living facility and uses a cane to assist with ambulation.  The morning of admission patient tripped over and fell on the left hip.  Patient was found to have acute left side intertrochanteric fracture commuted.  Patient s/p LEFT Hip intertrochanteric fracture with intramedullary nail on 03/23/2020 by Dr. Doran Durand.  Post operative course complicated by acute blood loss anemia post surgery. Plan is to repeat cbc this afternoon. If hb decreasing , patient might need one unit PRBC>    Subjective: Afebrile overnight, with episodes of bradycardia, A/O x1 (does not know where, when, why) whenever you ask a question answers totally inappropriately.  Pleasantly confused.   Assessment & Plan: Covid vaccination;   Active Problems:   Hip fracture, left, closed, initial encounter ([place])   Hip fracture ([place])   -Left intertrochanteric comminuted fracture: -Underwent open treatment of left hip intertrochanteric fracture with intramedullary nail by Dr. Doran Durand.  -Lovenox for DVT prophylaxis -Pain management. -PT recommends SNF   A fib;/Asymptomatic Bradycardia -Heart rate controlled.  In fact patient having some asymptomatic bradycardia, will watch closely since patient has a history of A. fib could this be the start of tachybradycardia syndrome? -ECHO; ejection fraction 55 to 60%.  The left ventricle has normal function tricuspid valve with mild to moderate rehabilitation.  Essential HTN/ -Hydralazine 10 mg TID   Peripheral edema -Resolved    Dementia (advanced) -Continue with Namenda, Seroquel, Remeron -Anemia panel pending  Acute blood loss anemia post surgery (expected  Hg 12 on admission).   Recent Labs  Lab 03/23/20 1145 03/24/20 0359 03/25/20 0606 03/25/20 1418 03/26/20 0342  HGB 12.4 9.7* 7.8* 7.4* 7.7*  -Appears hemoglobin is stabilized transfuse for hemoglobin<7 -Started on ferrous sulfate 325 mg daily -Vitamin C 500 mg daily -Anemia panel pending     DVT prophylaxis: Lovenox Code Status: DNR Family Communication:  Status is: Inpatient    Dispo: The patient is from: Assisted living facility              Anticipated d/c is to: SNF?              Anticipated d/c date is: Per surgery              Patient currently unstable      Consultants:  Orthopedic surgery  Procedures/Significant Events:  7/18 Open treatment of left hip intertrochanteric fracture with intramedullary nail   I have personally reviewed and interpreted all radiology studies and my findings are as above.  VENTILATOR SETTINGS:    Cultures 7/18 SARS, coronavirus negative  Antimicrobials:    Devices    LINES / TUBES:      Continuous Infusions:   Objective: Vitals:   03/25/20 1604 03/25/20 2020 03/25/20 2345 03/26/20 0433  BP: 140/69 131/77 (!) 147/68 103/70  Pulse: (!) 58 63 90 (!) 50  Resp: 18 18 20 16   Temp: 98.4 F (36.9 C) 98.6 F (37 C) 98.3 F (36.8 C) 98.3 F (36.8 C)  TempSrc: Oral  Oral   SpO2: 97% 94% 96% 100%  Weight:      Height:        Intake/Output Summary (Last 24 hours) at 03/26/2020 8588 Last data filed  at 03/26/2020 0600 Gross per 24 hour  Intake 475 ml  Output 450 ml  Net 25 ml   Filed Weights   03/23/20 2200  Weight: 59.4 kg    Examination:  General: A/O x1 (does not know where, when, why) No acute respiratory distress Eyes: negative scleral hemorrhage, negative anisocoria, negative icterus ENT: Negative Runny nose, negative gingival bleeding, Neck:  Negative scars, masses, torticollis, lymphadenopathy, JVD Lungs: Clear to auscultation bilaterally without wheezes or crackles Cardiovascular: Regular  rate and rhythm without murmur gallop or rub normal S1 and S2 Abdomen: negative abdominal pain, nondistended, positive soft, bowel sounds, no rebound, no ascites, no appreciable mass Extremities: No significant cyanosis, clubbing, or edema bilateral lower extremities Skin: Negative rashes, lesions, ulcers Psychiatric: Unable to evaluate secondary to dementia Central nervous system: Spontaneously moves extremities does not follow commands .     Data Reviewed: Care during the described time interval was provided by me .  I have reviewed this patient's available data, including medical history, events of note, physical examination, and all test results as part of my evaluation.  CBC: Recent Labs  Lab 03/23/20 1145 03/24/20 0359 03/25/20 0606 03/25/20 1418 03/26/20 0342  WBC 9.2 12.8* 12.7* 13.2* 11.8*  NEUTROABS 7.7  --   --   --   --   HGB 12.4 9.7* 7.8* 7.4* 7.7*  HCT 40.3 31.5* 24.7* 23.7* 24.4*  MCV 96.4 95.2 95.0 95.6 96.8  PLT 150 149* 131* 134* 086*   Basic Metabolic Panel: Recent Labs  Lab 03/23/20 1145 03/23/20 2213 03/24/20 0359 03/25/20 0606  NA 141  --  140 141  K 3.9  --  4.0 4.1  CL 108  --  104 105  CO2 25  --  27 28  GLUCOSE 130*  --  177* 116*  BUN 15  --  17 23  CREATININE 0.68  --  0.73 0.64  CALCIUM 8.9  --  8.4* 8.9  MG  --  1.8  --   --   PHOS  --  5.0*  --   --    GFR: Estimated Creatinine Clearance: 36.9 mL/min (by C-G formula based on SCr of 0.64 mg/dL). Liver Function Tests: No results for input(s): AST, ALT, ALKPHOS, BILITOT, PROT, ALBUMIN in the last 168 hours. No results for input(s): LIPASE, AMYLASE in the last 168 hours. No results for input(s): AMMONIA in the last 168 hours. Coagulation Profile: Recent Labs  Lab 03/23/20 1145  INR 1.1   Cardiac Enzymes: No results for input(s): CKTOTAL, CKMB, CKMBINDEX, TROPONINI in the last 168 hours. BNP (last 3 results) No results for input(s): PROBNP in the last 8760 hours. HbA1C: No  results for input(s): HGBA1C in the last 72 hours. CBG: No results for input(s): GLUCAP in the last 168 hours. Lipid Profile: No results for input(s): CHOL, HDL, LDLCALC, TRIG, CHOLHDL, LDLDIRECT in the last 72 hours. Thyroid Function Tests: Recent Labs    03/23/20 2213  TSH 1.998   Anemia Panel: Recent Labs    03/25/20 1912  VITAMINB12 325   Sepsis Labs: No results for input(s): PROCALCITON, LATICACIDVEN in the last 168 hours.  Recent Results (from the past 240 hour(s))  SARS Coronavirus 2 by RT PCR (hospital order, performed in [facility] hospital lab) Nasopharyngeal Nasopharyngeal Swab     Status: None   Collection Time: 03/23/20 12:10 PM   Specimen: Nasopharyngeal Swab  Result Value Ref Range Status   SARS Coronavirus 2 NEGATIVE NEGATIVE Final    Comment: (  NOTE) SARS-CoV-2 target nucleic acids are NOT DETECTED.  The SARS-CoV-2 RNA is generally detectable in upper and lower respiratory specimens during the acute phase of infection. The lowest concentration of SARS-CoV-2 viral copies this assay can detect is 250 copies / mL. A negative result does not preclude SARS-CoV-2 infection and should not be used as the sole basis for treatment or other patient management decisions.  A negative result may occur with improper specimen collection / handling, submission of specimen other than nasopharyngeal swab, presence of viral mutation(s) within the areas targeted by this assay, and inadequate number of viral copies (<250 copies / mL). A negative result must be combined with clinical observations, patient history, and epidemiological information.  Fact Sheet for Patients:   StrictlyIdeas.no  Fact Sheet for Healthcare Providers: BankingDealers.co.za  This test is not yet approved or  cleared by the [place] FDA and has been authorized for detection and/or diagnosis of SARS-CoV-2 by FDA under an Emergency Use Authorization  (EUA).  This EUA will remain in effect (meaning this test can be used) for the duration of the COVID-19 declaration under Section 564(b)(1) of the Act, 21 U.S.C. section 360bbb-3(b)(1), unless the authorization is terminated or revoked sooner.  Performed at [facility], [place] [address], [place], [place] [address]   MRSA PCR Screening     Status: None   Collection Time: 03/23/20 10:21 PM   Specimen: Nasal Mucosa; Nasopharyngeal  Result Value Ref Range Status   MRSA by PCR NEGATIVE NEGATIVE Final    Comment:        The GeneXpert MRSA Assay (FDA approved for NASAL specimens only), is one component of a comprehensive MRSA colonization surveillance program. It is not intended to diagnose MRSA infection nor to guide or monitor treatment for MRSA infections. Performed at [facility], [place] [address], [place], [place] [address]          Radiology Studies: ECHOCARDIOGRAM COMPLETE  Result Date: 03/24/2020    ECHOCARDIOGRAM REPORT   Patient Name:   KAMLA SKILTON Date of Exam: 03/24/2020 Medical Rec #:  [id number]     Height:       60.0 in Accession #:    [id number]    Weight:       131.0 lb Date of Birth:  Jul 04, 1927      BSA:          1.559 m Patient Age:    52 years      BP:           105/56 mmHg Patient Gender: F             HR:           59 bpm. Exam Location:  Inpatient Procedure: 2D Echo, Color Doppler, Cardiac Doppler and 3D Echo Indications:    R01.1 Murmur  History:        Patient has prior history of Echocardiogram examinations, most                 recent 03/16/2017. Risk Factors:Hypertension and Dyslipidemia.  Sonographer:    Raquel Sarna Senior RDCS Referring Phys: [place] IMPRESSIONS  1. Left ventricular ejection fraction, by estimation, is 55 to 60%. The left ventricle has normal function. The left ventricle has no regional wall motion abnormalities. Left ventricular diastolic function could not be evaluated.  2. Right ventricular systolic function is normal. The  right ventricular size is normal. There is mildly elevated pulmonary artery systolic pressure.  3.  Tracings suggest atrial flutter with variable conduction, recommend clinical correlation.. Left atrial size was severely dilated.  4. Right atrial size was moderately dilated.  5. The mitral valve is normal in structure. Mild mitral valve regurgitation. No evidence of mitral stenosis.  6. Tricuspid valve regurgitation is mild to moderate.  7. The aortic valve is tricuspid. Aortic valve regurgitation is mild. Mild aortic valve sclerosis is present, with no evidence of aortic valve stenosis.  8. Aortic dilatation noted. There is moderate dilatation of the ascending aorta measuring 45 mm.  9. The inferior vena cava is dilated in size with >50% respiratory variability, suggesting right atrial pressure of 8 mmHg. FINDINGS  Left Ventricle: Left ventricular ejection fraction, by estimation, is 55 to 60%. The left ventricle has normal function. The left ventricle has no regional wall motion abnormalities. The left ventricular internal cavity size was normal in size. There is  no left ventricular hypertrophy. Left ventricular diastolic function could not be evaluated due to atrial fibrillation. Left ventricular diastolic function could not be evaluated. Right Ventricle: The right ventricular size is normal. No increase in right ventricular wall thickness. Right ventricular systolic function is normal. There is mildly elevated pulmonary artery systolic pressure. The tricuspid regurgitant velocity is 2.77  m/s, and with an assumed right atrial pressure of 8 mmHg, the estimated right ventricular systolic pressure is 32.9 mmHg. Left Atrium: Tracings suggest atrial flutter with variable conduction, recommend clinical correlation. Left atrial size was severely dilated. Right Atrium: Right atrial size was moderately dilated. Pericardium: There is no evidence of pericardial effusion. Mitral Valve: The mitral valve is normal in  structure. There is mild thickening of the mitral valve leaflet(s). There is mild calcification of the mitral valve leaflet(s). Mild mitral annular calcification. Mild mitral valve regurgitation. No evidence of  mitral valve stenosis. Tricuspid Valve: The tricuspid valve is normal in structure. Tricuspid valve regurgitation is mild to moderate. No evidence of tricuspid stenosis. Aortic Valve: The aortic valve is tricuspid. Aortic valve regurgitation is mild. Aortic regurgitation PHT measures 989 msec. Mild aortic valve sclerosis is present, with no evidence of aortic valve stenosis. There is mild calcification of the aortic valve. Aortic valve mean gradient measures 9.0 mmHg. Aortic valve peak gradient measures 19.2 mmHg. Aortic valve area, by VTI measures 1.73 cm. Pulmonic Valve: The pulmonic valve was not well visualized. Pulmonic valve regurgitation is not visualized. Aorta: Aortic dilatation noted. There is moderate dilatation of the ascending aorta measuring 45 mm. Venous: The inferior vena cava is dilated in size with greater than 50% respiratory variability, suggesting right atrial pressure of 8 mmHg. IAS/Shunts: The atrial septum is grossly normal.  LEFT VENTRICLE PLAX 2D LVIDd:         3.50 cm  Diastology LVIDs:         2.70 cm  LV e' lateral:   6.64 cm/s LV PW:         0.90 cm  LV E/e' lateral: 15.5 LV IVS:        1.10 cm  LV e' medial:    6.85 cm/s LVOT diam:     2.00 cm  LV E/e' medial:  15.0 LV SV:         67 LV SV Index:   43 LVOT Area:     3.14 cm  RIGHT VENTRICLE RV S prime:     14.40 cm/s TAPSE (M-mode): 1.8 cm LEFT ATRIUM             Index  RIGHT ATRIUM           Index LA diam:        3.70 cm 2.37 cm/m  RA Area:     23.90 cm LA Vol (A2C):   92.8 ml 59.52 ml/m RA Volume:   70.00 ml  44.90 ml/m LA Vol (A4C):   85.9 ml 55.09 ml/m LA Biplane Vol: 90.2 ml 57.85 ml/m  AORTIC VALVE AV Area (Vmax):    1.43 cm AV Area (Vmean):   1.63 cm AV Area (VTI):     1.73 cm AV Vmax:           219.00  cm/s AV Vmean:          139.000 cm/s AV VTI:            0.386 m AV Peak Grad:      19.2 mmHg AV Mean Grad:      9.0 mmHg LVOT Vmax:         99.90 cm/s LVOT Vmean:        72.100 cm/s LVOT VTI:          0.212 m LVOT/AV VTI ratio: 0.55 AI PHT:            989 msec  AORTA Ao Root diam: 3.50 cm Ao Asc diam:  4.45 cm MITRAL VALVE                TRICUSPID VALVE MV Area (PHT): 2.93 cm     TR Peak grad:   30.7 mmHg MV Decel Time: 259 msec     TR Vmax:        277.00 cm/s MV E velocity: 103.00 cm/s MV A velocity: 25.30 cm/s   SHUNTS MV E/A ratio:  4.07         Systemic VTI:  0.21 m                             Systemic Diam: 2.00 cm Buford Dresser MD Electronically signed by Buford Dresser MD Signature Date/Time: 03/24/2020/3:53:08 PM    Final         Scheduled Meds: . aspirin EC  81 mg Oral Daily  . docusate sodium  100 mg Oral BID  . enoxaparin (LOVENOX) injection  40 mg Subcutaneous Q24H  . feeding supplement (ENSURE ENLIVE)  237 mL Oral Q1500  . ferrous sulfate  325 mg Oral Q breakfast  . melatonin  5 mg Oral QHS  . memantine  10 mg Oral BID  . mirtazapine  7.5 mg Oral QHS  . pantoprazole  40 mg Oral Daily  . senna  1 tablet Oral BID  . simvastatin  20 mg Oral q1800   Continuous Infusions:   LOS: 3 days    Time spent:40 min    Terisa Belardo, Geraldo Docker, MD [facility] Pager [phone]  If 7PM-7AM, please contact night-coverage [email or web address] Password [facility] 03/26/2020, 8:07 AM

## 2020-03-26 NOTE — TOC CAGE-AID Note (Signed)
Transition of Care [facility]) - CAGE-AID Screening   Patient Details  Name: Chelsea Jordan MRN: [id number] Date of Birth: [birth year]  Transition of Care [facility]) CM/SW Contact:    Emeterio Reeve, [place] Phone Number: 03/26/2020, 1:56 PM   Clinical Narrative:  Pt is unable to participate in assessment due to not being fully oriented.   CAGE-AID Screening: Substance Abuse Screening unable to be completed due to: : Patient unable to participate                  [place]idence [place] Clinical Social Worker [phone]

## 2020-03-26 NOTE — Progress Notes (Signed)
Subjective: 3 Days Post-Op Procedure(s) (LRB): INTRAMEDULLARY (IM) NAIL INTERTROCHANTRIC HIP (Left)  Patient resting comfortably in bed.  Confused.  Denies fever, chills, N/V, CP, SOB.  Objective:   VITALS:  Temp:  [97.9 F (36.6 C)-98.7 F (37.1 C)] 98.3 F (36.8 C) (07/21 0433) Pulse Rate:  [50-90] 50 (07/21 0433) Resp:  [16-20] 16 (07/21 0433) BP: (103-147)/(68-77) 103/70 (07/21 0433) SpO2:  [94 %-100 %] 100 % (07/21 0433)  General: WDWN patient in NAD. Psych:  Appropriate mood and affect. Neuro:  A&O x 3, Moving all extremities, sensation intact to light touch HEENT:  EOMs intact Chest:  Even non-labored respirations Skin:  Dressing to L hip C/D/I, no rashes or lesions Extremities: warm/dry, mild edema to L hip, no erythema or echymosis.  No lymphadenopathy. Pulses: Popliteus 2+ MSK:  ROM: TKE, MMT: able to perform quad set, (-) Homan's    LABS Recent Labs    03/23/20 1145 03/23/20 1145 03/24/20 0359 03/24/20 0359 03/25/20 0606 03/25/20 0606 03/25/20 1418 03/26/20 0342  HGB 12.4  --  9.7*  --  7.8*  --  7.4* 7.7*  WBC 9.2   < > 12.8*   < > 12.7*   < > 13.2* 11.8*  PLT 150   < > 149*   < > 131*   < > 134* 134*   < > = values in this interval not displayed.   Recent Labs    03/24/20 0359 03/25/20 0606  NA 140 141  K 4.0 4.1  CL 104 105  CO2 27 28  BUN 17 23  CREATININE 0.73 0.64  GLUCOSE 177* 116*   Recent Labs    03/23/20 1145  INR 1.1     Assessment/Plan: 3 Days Post-Op Procedure(s) (LRB): INTRAMEDULLARY (IM) NAIL INTERTROCHANTRIC HIP (Left)  WBAT L LE Up with therapy DVT ppx:  Lovenox in house, 81 mg ASA BID upon D/C D/C scripts on chart Disp: SNF Plan for 2 week outpatient post-op visit with Dr. Doran Durand Ortho signing off.  Please call with any questions/concerns.  Mechele Claude PA-C [facility] Office:  [phone]

## 2020-03-27 LAB — CBC WITH DIFFERENTIAL/PLATELET
Abs Immature Granulocytes: 0.06 10*3/uL (ref 0.00–0.07)
Basophils Absolute: 0 10*3/uL (ref 0.0–0.1)
Basophils Relative: 0 %
Eosinophils Absolute: 0.2 10*3/uL (ref 0.0–0.5)
Eosinophils Relative: 2 %
HCT: 22.6 % — ABNORMAL LOW (ref 36.0–46.0)
Hemoglobin: 7 g/dL — ABNORMAL LOW (ref 12.0–15.0)
Immature Granulocytes: 1 %
Lymphocytes Relative: 20 %
Lymphs Abs: 1.7 10*3/uL (ref 0.7–4.0)
MCH: 30.3 pg (ref 26.0–34.0)
MCHC: 31 g/dL (ref 30.0–36.0)
MCV: 97.8 fL (ref 80.0–100.0)
Monocytes Absolute: 1.1 10*3/uL — ABNORMAL HIGH (ref 0.1–1.0)
Monocytes Relative: 13 %
Neutro Abs: 5.3 10*3/uL (ref 1.7–7.7)
Neutrophils Relative %: 64 %
Platelets: 150 10*3/uL (ref 150–400)
RBC: 2.31 MIL/uL — ABNORMAL LOW (ref 3.87–5.11)
RDW: 13.4 % (ref 11.5–15.5)
WBC: 8.3 10*3/uL (ref 4.0–10.5)
nRBC: 0.2 % (ref 0.0–0.2)

## 2020-03-27 LAB — MAGNESIUM: Magnesium: 1.8 mg/dL (ref 1.7–2.4)

## 2020-03-27 LAB — COMPREHENSIVE METABOLIC PANEL
ALT: 15 U/L (ref 0–44)
AST: 16 U/L (ref 15–41)
Albumin: 2.5 g/dL — ABNORMAL LOW (ref 3.5–5.0)
Alkaline Phosphatase: 57 U/L (ref 38–126)
Anion gap: 6 (ref 5–15)
BUN: 15 mg/dL (ref 8–23)
CO2: 29 mmol/L (ref 22–32)
Calcium: 8.3 mg/dL — ABNORMAL LOW (ref 8.9–10.3)
Chloride: 107 mmol/L (ref 98–111)
Creatinine, Ser: 0.54 mg/dL (ref 0.44–1.00)
GFR calc Af Amer: >60 mL/min (ref >60)
GFR calc non Af Amer: >60 mL/min (ref >60)
Glucose, Bld: 96 mg/dL (ref 70–99)
Potassium: 3.9 mmol/L (ref 3.5–5.1)
Sodium: 142 mmol/L (ref 135–145)
Total Bilirubin: 1.4 mg/dL — ABNORMAL HIGH (ref 0.3–1.2)
Total Protein: 4.5 g/dL — ABNORMAL LOW (ref 6.5–8.1)

## 2020-03-27 LAB — PREPARE RBC (CROSSMATCH)

## 2020-03-27 LAB — SARS CORONAVIRUS 2 (TAT 6-24 HRS): SARS Coronavirus 2: NEGATIVE

## 2020-03-27 LAB — LACTATE DEHYDROGENASE: LDH: 157 U/L (ref 98–192)

## 2020-03-27 LAB — PHOSPHORUS: Phosphorus: 3.1 mg/dL (ref 2.5–4.6)

## 2020-03-27 MED ORDER — SODIUM CHLORIDE 0.9% IV SOLUTION: Freq: Once | INTRAVENOUS | Status: AC

## 2020-03-27 NOTE — Progress Notes (Signed)
PROGRESS NOTE    Chelsea Jordan  PJA:[id number] DOB: 1926/11/24 DOA: 03/23/2020 PCP: Virgie Dad, MD     Brief Narrative:  86 year old WF PMHx Advanced Dmentia, chronic ambulatory dysfunction, HTN, GIST tumor, Aortic Aneurysmal, HLD,   Presents after mechanical fall and left hip pain.  Patient lives at assisted living facility and uses a cane to assist with ambulation.  The morning of admission patient tripped over and fell on the left hip.  Patient was found to have acute left side intertrochanteric fracture commuted.  Patient s/p LEFT Hip intertrochanteric fracture with intramedullary nail on 03/23/2020 by Dr. Doran Durand.  Post operative course complicated by acute blood loss anemia post surgery. Plan is to repeat cbc this afternoon. If hb decreasing , patient might need one unit PRBC>    Subjective: 7/22 well overnight, continued episodes of bradycardia   afebrile overnight, with episodes of bradycardia, A/O x1 (does not know where, when, why) whenever you ask a question answers totally inappropriately.  Pleasantly confused.   Assessment & Plan: Covid vaccination;   Active Problems:   Hip fracture, left, closed, initial encounter ([place])   Hip fracture ([place])   -Left intertrochanteric comminuted fracture: -Underwent open treatment of left hip intertrochanteric fracture with intramedullary nail by Dr. Doran Durand.  -Lovenox for DVT prophylaxis -Pain management. -PT recommends SNF   A fib;/Asymptomatic Bradycardia -Heart rate controlled.  In fact patient having some asymptomatic bradycardia, will watch closely since patient has a history of A. fib could this be the start of tachybradycardia syndrome? -ECHO; ejection fraction 55 to 60%.  The left ventricle has normal function tricuspid valve with mild to moderate rehabilitation.  Essential HTN/ -Hydralazine 10 mg TID   Peripheral edema -Resolved    Dementia (advanced) -Continue with Namenda, Seroquel, Remeron -Anemia  panel pending  Acute blood loss anemia post surgery (expected Hg 12 on admission).   Recent Labs  Lab 03/24/20 0359 03/25/20 0606 03/25/20 1418 03/26/20 0342 03/27/20 0401  HGB 9.7* 7.8* 7.4* 7.7* 7.0*  -Appears hemoglobin is stabilized transfuse for hemoglobin<7 -Occult blood pending -Started on ferrous sulfate 325 mg daily -Vitamin C 500 mg daily -Anemia panel pending -7/22 transfuse 1 unit PRBC -Haptoglobin/LDH pending   DVT prophylaxis: Lovenox Code Status: DNR Family Communication:  Status is: Inpatient    Dispo: The patient is from: Assisted living facility              Anticipated d/c is to: SNF?              Anticipated d/c date is: Per surgery              Patient currently unstable      Consultants:  Orthopedic surgery   Procedures/Significant Events:  7/18 Open treatment of left hip intertrochanteric fracture with intramedullary nail 7/22 transfuse 1 unit PRBC   I have personally reviewed and interpreted all radiology studies and my findings are as above.  VENTILATOR SETTINGS:    Cultures 7/18 SARS, coronavirus negative  Antimicrobials:    Devices    LINES / TUBES:      Continuous Infusions:   Objective: Vitals:   03/26/20 2307 03/27/20 0322 03/27/20 0350 03/27/20 0801  BP: 116/68 106/60  (!) 119/57  Pulse: (!) 56 (!) 51  (!) 50  Resp: 20 20  19   Temp: 98.1 F (36.7 C)  (!) 97.4 F (36.3 C) 98.1 F (36.7 C)  TempSrc: Oral  Oral Axillary  SpO2: 100% 100%  99%  Weight:  Height:        Intake/Output Summary (Last 24 hours) at 03/27/2020 1018 Last data filed at 03/26/2020 2230 Gross per 24 hour  Intake --  Output 325 ml  Net -325 ml   Filed Weights   03/23/20 2200  Weight: 59.4 kg    Examination:  General: A/O x1 (does not know where, when, why) No acute respiratory distress Eyes: negative scleral hemorrhage, negative anisocoria, negative icterus ENT: Negative Runny nose, negative gingival bleeding, Neck:   Negative scars, masses, torticollis, lymphadenopathy, JVD Lungs: Clear to auscultation bilaterally without wheezes or crackles Cardiovascular: Regular rate and rhythm without murmur gallop or rub normal S1 and S2 Abdomen: negative abdominal pain, nondistended, positive soft, bowel sounds, no rebound, no ascites, no appreciable mass Extremities: No significant cyanosis, clubbing, or edema bilateral lower extremities Skin: Negative rashes, lesions, ulcers Psychiatric: Unable to evaluate secondary to dementia Central nervous system: Spontaneously moves extremities does not follow commands .     Data Reviewed: Care during the described time interval was provided by me .  I have reviewed this patient's available data, including medical history, events of note, physical examination, and all test results as part of my evaluation.  CBC: Recent Labs  Lab 03/23/20 1145 03/23/20 1145 03/24/20 0359 03/25/20 0606 03/25/20 1418 03/26/20 0342 03/27/20 0401  WBC 9.2   < > 12.8* 12.7* 13.2* 11.8* 8.3  NEUTROABS 7.7  --   --   --   --   --  5.3  HGB 12.4   < > 9.7* 7.8* 7.4* 7.7* 7.0*  HCT 40.3   < > 31.5* 24.7* 23.7* 24.4* 22.6*  MCV 96.4   < > 95.2 95.0 95.6 96.8 97.8  PLT 150   < > 149* 131* 134* 134* 150   < > = values in this interval not displayed.   Basic Metabolic Panel: Recent Labs  Lab 03/23/20 1145 03/23/20 2213 03/24/20 0359 03/25/20 0606 03/26/20 0950 03/27/20 0401  NA 141  --  140 141 140 142  K 3.9  --  4.0 4.1 3.5 3.9  CL 108  --  104 105 104 107  CO2 25  --  27 28 28 29   GLUCOSE 130*  --  177* 116* 122* 96  BUN 15  --  17 23 17 15   CREATININE 0.68  --  0.73 0.64 0.55 0.54  CALCIUM 8.9  --  8.4* 8.9 8.6* 8.3*  MG  --  1.8  --   --  1.8 1.8  PHOS  --  5.0*  --   --  2.8 3.1   GFR: Estimated Creatinine Clearance: 36.9 mL/min (by C-G formula based on SCr of 0.54 mg/dL). Liver Function Tests: Recent Labs  Lab 03/26/20 0950 03/27/20 0401  AST 18 16  ALT 15 15    ALKPHOS 59 57  BILITOT 1.1 1.4*  PROT 5.0* 4.5*  ALBUMIN 2.9* 2.5*   No results for input(s): LIPASE, AMYLASE in the last 168 hours. No results for input(s): AMMONIA in the last 168 hours. Coagulation Profile: Recent Labs  Lab 03/23/20 1145  INR 1.1   Cardiac Enzymes: No results for input(s): CKTOTAL, CKMB, CKMBINDEX, TROPONINI in the last 168 hours. BNP (last 3 results) No results for input(s): PROBNP in the last 8760 hours. HbA1C: No results for input(s): HGBA1C in the last 72 hours. CBG: No results for input(s): GLUCAP in the last 168 hours. Lipid Profile: No results for input(s): CHOL, HDL, LDLCALC, TRIG, CHOLHDL, LDLDIRECT in the last  72 hours. Thyroid Function Tests: No results for input(s): TSH, T4TOTAL, FREET4, T3FREE, THYROIDAB in the last 72 hours. Anemia Panel: Recent Labs    03/25/20 1912 03/26/20 1207  VITAMINB12 325 314  FOLATE  --  11.4  FERRITIN  --  109  TIBC  --  195*  IRON  --  34  RETICCTPCT  --  4.3*   Sepsis Labs: No results for input(s): PROCALCITON, LATICACIDVEN in the last 168 hours.  Recent Results (from the past 240 hour(s))  SARS Coronavirus 2 by RT PCR (hospital order, performed in [facility] hospital lab) Nasopharyngeal Nasopharyngeal Swab     Status: None   Collection Time: 03/23/20 12:10 PM   Specimen: Nasopharyngeal Swab  Result Value Ref Range Status   SARS Coronavirus 2 NEGATIVE NEGATIVE Final    Comment: (NOTE) SARS-CoV-2 target nucleic acids are NOT DETECTED.  The SARS-CoV-2 RNA is generally detectable in upper and lower respiratory specimens during the acute phase of infection. The lowest concentration of SARS-CoV-2 viral copies this assay can detect is 250 copies / mL. A negative result does not preclude SARS-CoV-2 infection and should not be used as the sole basis for treatment or other patient management decisions.  A negative result may occur with improper specimen collection / handling, submission of specimen  other than nasopharyngeal swab, presence of viral mutation(s) within the areas targeted by this assay, and inadequate number of viral copies (<250 copies / mL). A negative result must be combined with clinical observations, patient history, and epidemiological information.  Fact Sheet for Patients:   StrictlyIdeas.no  Fact Sheet for Healthcare Providers: BankingDealers.co.za  This test is not yet approved or  cleared by the [place] FDA and has been authorized for detection and/or diagnosis of SARS-CoV-2 by FDA under an Emergency Use Authorization (EUA).  This EUA will remain in effect (meaning this test can be used) for the duration of the COVID-19 declaration under Section 564(b)(1) of the Act, 21 U.S.C. section 360bbb-3(b)(1), unless the authorization is terminated or revoked sooner.  Performed at [facility], [place] [address] [place], [place] [address]   MRSA PCR Screening     Status: None   Collection Time: 03/23/20 10:21 PM   Specimen: Nasal Mucosa; Nasopharyngeal  Result Value Ref Range Status   MRSA by PCR NEGATIVE NEGATIVE Final    Comment:        The GeneXpert MRSA Assay (FDA approved for NASAL specimens only), is one component of a comprehensive MRSA colonization surveillance program. It is not intended to diagnose MRSA infection nor to guide or monitor treatment for MRSA infections. Performed at [facility], [place] [address], [place], [place] [address]          Radiology Studies: No results found.      Scheduled Meds: . vitamin C  500 mg Oral Daily  . aspirin EC  81 mg Oral Daily  . docusate sodium  100 mg Oral BID  . enoxaparin (LOVENOX) injection  40 mg Subcutaneous Q24H  . feeding supplement (ENSURE ENLIVE)  237 mL Oral Q1500  . ferrous sulfate  325 mg Oral Q breakfast  . melatonin  5 mg Oral QHS  . memantine  10 mg Oral BID  . mirtazapine  7.5 mg Oral QHS  . pantoprazole  40 mg  Oral Daily  . senna  1 tablet Oral BID  . simvastatin  20 mg Oral q1800   Continuous Infusions:   LOS: 4 days  Time spent:40 min    Madelline Eshbach, Geraldo Docker, MD [facility] Pager [phone]  If 7PM-7AM, please contact night-coverage [email or web address] Password [facility] 03/27/2020, 10:18 AM

## 2020-03-27 NOTE — TOC Initial Note (Signed)
Transition of Care [facility]) - Initial/Assessment Note    Patient Details  Name: Chelsea Jordan MRN: [id number] Date of Birth: 08-05-1927  Transition of Care [facility] CM/SW Contact:    Chelsea Jordan, [place] Phone Number: 03/27/2020, 1:40 PM  Clinical Narrative:                  CSW spoke with patient's spouse,Chelsea Jordan. He confirmed patient reside at [facility]. CSW discussed PT recommendation of short tem rehab at SNF ([facility]/SNF side). Chelsea Jordan states he is aware of recommendation and is agreeable to SNF placement. CSW explained the SNF process.   CSW spoke with [facility] Admissions Coordinator Chelsea Jordan # [phone] and confirmed discharge plan.   CSW requested covid CSW started insurance auhtorization  Chelsea Jordan, MSW, [employer] Clinical Social Worker   Expected Discharge Plan: Skilled Nursing Facility Barriers to Discharge: Ship broker (covis test)   Patient Goals and CMS Choice        Expected Discharge Plan and Services Expected Discharge Plan: [place] In-house Referral: Clinical Social Work     Living arrangements for the past 2 months: [place]                                      Prior Living Arrangements/Services Living arrangements for the past 2 months: [place] Lives with:: Self, Spouse, Facility Resident Patient language and need for interpreter reviewed:: No        Need for Family Participation in Patient Care: Yes (Comment) Care giver support system in place?: Yes (comment)   Criminal Activity/Legal Involvement Pertinent to Current Situation/Hospitalization: No - Comment as needed  Activities of Daily Living      Permission Sought/Granted Permission sought to share information with : Family Supports Permission granted to share information with : Yes, Verbal Permission Granted  Share Information with NAME: Chelsea Jordan  Permission granted to share info w AGENCY:  SNF  Permission granted to share info w Relationship: spouse  Permission granted to share info w Contact Information: [phone]  Emotional Assessment       Orientation: : Oriented to Self Alcohol / Substance Use: Not Applicable Psych Involvement: No (comment)  Admission diagnosis:  Hip fracture ([place]) [S72.009A] Closed fracture of hip, unspecified laterality, initial encounter ([place]) [S72.009A] Patient Active Problem List   Diagnosis Date Noted  . Fall 03/25/2020  . Hip fracture, left, closed, initial encounter ([place]) 03/23/2020  . Hip fracture ([place]) 03/23/2020  . Insomnia 01/21/2020  . Weight loss 05/30/2019  . Dizziness 02/28/2019  . Dementia ([place]) 06/22/2018  . Olecranon fracture, left, closed, initial encounter 11/05/2017  . Preventative health care 07/15/2016  . Hyponatremia 12/25/2014  . Lightheadedness 12/25/2014  . Aortic stenosis 12/20/2014  . 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 Medicare annual wellness visit, subsequent 07/12/2014  . Thoracic aortic aneurysm ([place]) 07/08/2014  . Recurrent UTI 03/12/2014  . Eustachian tube dysfunction 08/16/2013  . Macular degeneration   . Mitral valve disorder 11/17/2009  . Hyperlipidemia 09/24/2009  . Essential hypertension 09/24/2009  . Osteoporosis 07/15/2009  . MENINGIOMA 10/20/2007  . 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 Asthma 10/20/2007  . NEPHROLITHIASIS 10/20/2007   PCP:  Chelsea Dad, MD Pharmacy:   [id number] [place], [place] [address] Phone: [phone] Fax: [phone]  Social Determinants of Health (SDOH) Interventions    Readmission Risk Interventions No flowsheet data found.

## 2020-03-27 NOTE — Progress Notes (Signed)
Occupational Therapy Treatment Patient Details Name: Chelsea Jordan MRN: [id number] DOB: 03-Jul-1927 Today's Date: 03/27/2020    History of present illness Pt is a 86 y.o. female with PMH of HTN, mitral valve disorder, asthma, GIST, eustachian tube dysfunction, dementia, osteoporosis, HLD, macular degeneration, GERD, anxiety, and L oclecranon fx. Presenting to ED after mechanical fall with L hip displacement and comminuted intertrochanteric fx. S/p Open treatment of left hip intertrochanteric fracture with intramedullary nail on 7/18.   OT comments  Patient continues to make steady progress towards goals in skilled OT session. Patient's session encompassed transfers and ADLs in order to increase overall functional activity tolerance. Pt remains limited due to baseline cognitive deficits, but was able to be redirected with multi-modal cues. Pt remains perseverative on going to the bathroom despite transferring to [facility] and being unable. Discharge remains appropriate at this time; will continue to follow acutely.    Follow Up Recommendations  SNF;Supervision/Assistance - 24 hour    Equipment Recommendations  Other (comment) (Defer to next venue)    Recommendations for Other Services      Precautions / Restrictions Precautions Precautions: Fall Restrictions Weight Bearing Restrictions: Yes LLE Weight Bearing: Weight bearing as tolerated       Mobility Bed Mobility               General bed mobility comments: Pt in recliner on arrival  Transfers Overall transfer level: Needs assistance Equipment used: Rolling walker (2 wheeled) Transfers: Sit to/from Stand Sit to Stand: Min assist         General transfer comment: pt requires minA to power up and to bring forward from slight posterior lean, able to take a few steps to date    Balance Overall balance assessment: Needs assistance Sitting-balance support: Feet supported;Single extremity supported Sitting balance-Leahy Scale:  Fair     Standing balance support: Bilateral upper extremity supported Standing balance-Leahy Scale: Poor Standing balance comment: minA with BUE support of RW                           ADL either performed or assessed with clinical judgement   ADL Overall ADL's : Needs assistance/impaired                     Lower Body Dressing: Total assistance;Sitting/lateral leans Lower Body Dressing Details (indicate cue type and reason): Adjust socks, pt not able to sequence despite multi-modal cues Toilet Transfer: Minimal assistance;BSC;Stand-pivot;Ambulation;RW Toilet Transfer Details (indicate cue type and reason): Able to ambulate a few steps to [facility], limited by pain at operative site         Functional mobility during ADLs: Minimal assistance;Moderate assistance;Rolling walker;Cueing for sequencing;Cueing for safety General ADL Comments: Pt perseverating on having to go to the bathroom, despite transfering to [facility] and back and unable to have success, pt remains limited due to pain and impaired cognition at baseline     Vision       Perception     Praxis      Cognition Arousal/Alertness: Awake/alert Behavior During Therapy: WFL for tasks assessed/performed Overall Cognitive Status: History of cognitive impairments - at baseline                                 General Comments: dementia at baseline per chart        Exercises     Shoulder Instructions  General Comments      Pertinent Vitals/ Pain       Pain Assessment: Faces Faces Pain Scale: Hurts little more Pain Location: L hip Pain Descriptors / Indicators: Grimacing;Sore Pain Intervention(s): Limited activity within patient's tolerance;Monitored during session;Repositioned  Home Living                                          Prior Functioning/Environment              Frequency  Min 2X/week        Progress Toward Goals  OT Goals(current  goals can now be found in the care plan section)  Progress towards OT goals: Progressing toward goals  Acute Rehab OT Goals Patient Stated Goal: Hot coffee!!!! OT Goal Formulation: Patient unable to participate in goal setting Time For Goal Achievement: 04/07/20 Potential to Achieve Goals: Good  Plan Discharge plan remains appropriate    Co-evaluation                 AM-PAC OT "6 Clicks" Daily Activity     Outcome Measure   Help from another person eating meals?: A Little Help from another person taking care of personal grooming?: A Little Help from another person toileting, which includes using toliet, bedpan, or urinal?: A Lot Help from another person bathing (including washing, rinsing, drying)?: A Lot Help from another person to put on and taking off regular upper body clothing?: A Lot Help from another person to put on and taking off regular lower body clothing?: Total 6 Click Score: 13    End of Session Equipment Utilized During Treatment: Gait belt;Rolling walker  OT Visit Diagnosis: Muscle weakness (generalized) (M62.81);Pain;History of falling (Z91.81);Unsteadiness on feet (R26.81);Other abnormalities of gait and mobility (R26.89) Pain - Right/Left: Left Pain - part of body: Hip   Activity Tolerance Patient tolerated treatment well;Patient limited by pain   Patient Left in chair;with call bell/phone within reach;with chair alarm set   Nurse Communication Mobility status;Other (comment) (Perseverating on bathroom despite therapist attempting toileting)        Time: [phone] OT Time Calculation (min): 17 min  Charges: OT General Charges $OT Visit: 1 Visit OT Treatments $Self Care/Home Management : 8-22 mins  Corinne Ports E. Rorik Vespa, COTA/L Acute Rehabilitation Services [phone] [place] 03/27/2020, 11:29 AM

## 2020-03-27 NOTE — NC FL2 (Signed)
[place] LEVEL OF CARE SCREENING TOOL     IDENTIFICATION  Patient Name: Chelsea Jordan Birthdate: 08-Aug-1927 Sex: female Admission Date (Current Location): 03/23/2020  [facility] and [place] Number:  Herbalist and Address:  [facility] [place], [place] [address], [place], [place] [address]      Provider Number: [id number]  Attending Physician Name and Address:  Allie Bossier, MD  Relative Name and Phone Number:       Current Level of Care: Hospital Recommended Level of Care: [place] Prior Approval Number:    Date Approved/Denied:   PASRR Number: [id number]  Discharge Plan: SNF    Current Diagnoses: Patient Active Problem List   Diagnosis Date Noted  . Fall 03/25/2020  . Hip fracture, left, closed, initial encounter ([place]) 03/23/2020  . Hip fracture ([place]) 03/23/2020  . Insomnia 01/21/2020  . Weight loss 05/30/2019  . Dizziness 02/28/2019  . Dementia ([place]) 06/22/2018  . Olecranon fracture, left, closed, initial encounter 11/05/2017  . Preventative health care 07/15/2016  . Hyponatremia 12/25/2014  . Lightheadedness 12/25/2014  . Aortic stenosis 12/20/2014  . 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 Medicare annual wellness visit, subsequent 07/12/2014  . Thoracic aortic aneurysm ([place]) 07/08/2014  . Recurrent UTI 03/12/2014  . Eustachian tube dysfunction 08/16/2013  . Macular degeneration   . Mitral valve disorder 11/17/2009  . Hyperlipidemia 09/24/2009  . Essential hypertension 09/24/2009  . Osteoporosis 07/15/2009  . MENINGIOMA 10/20/2007  . 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 Asthma 10/20/2007  . NEPHROLITHIASIS 10/20/2007    Orientation RESPIRATION BLADDER Height & Weight     Self  O2 External catheter, Incontinent Weight: 130 lb 15.3 oz (59.4 kg) Height:  5' (152.4 cm)  BEHAVIORAL SYMPTOMS/MOOD NEUROLOGICAL BOWEL NUTRITION STATUS       Continent Diet (please see discharge summary)  AMBULATORY STATUS COMMUNICATION OF NEEDS Skin     Verbally Surgical wounds (closed incision left hip)                       Personal Care Assistance Level of Assistance  Bathing, Feeding, Dressing Bathing Assistance: Limited assistance Feeding assistance: Independent Dressing Assistance: Limited assistance     Functional Limitations Info  Sight, Hearing, Speech Sight Info: Adequate Hearing Info: Adequate Speech Info: Adequate    SPECIAL CARE FACTORS FREQUENCY  PT (By licensed PT), OT (By licensed OT)     PT Frequency: 5x per week OT Frequency: 5x per week            Contractures Contractures Info: Not present    Additional Factors Info  Code Status, Allergies Code Status Info: DNR Allergies Info: pentohal,codeine,levofloxacin,Oxycodone-acetaminophen,Sulfonamide Derivatives,tape           Current Medications (03/27/2020):  This is the current hospital active medication list Current Facility-Administered Medications  Medication Dose Route Frequency Provider Last Rate Last Admin  . 0.9 %  sodium chloride infusion (Manually program via Guardrails IV Fluids)   Intravenous Once Allie Bossier, MD      . acetaminophen (TYLENOL) tablet 500 mg  500 mg Oral Daily PRN Wylene Simmer, MD   500 mg at 03/25/20 0755  . ascorbic acid (VITAMIN C) tablet 500 mg  500 mg Oral Daily Allie Bossier, MD   500 mg at 03/27/20 0908  . aspirin EC tablet 81 mg  81 mg Oral Daily Wylene Simmer, MD   [phone]  mg at 03/27/20 0909  . bisacodyl (DULCOLAX) suppository 10 mg  10 mg Rectal Daily PRN Wylene Simmer, MD      . docusate sodium (COLACE) capsule 100 mg  100 mg Oral BID Wylene Simmer, MD   100 mg at 03/27/20 0909  . enoxaparin (LOVENOX) injection 40 mg  40 mg Subcutaneous Q24H Wylene Simmer, MD   40 mg at 03/27/20 0908  . feeding supplement (ENSURE ENLIVE) (ENSURE ENLIVE) liquid 237 mL  237 mL Oral Q1500 Regalado, Belkys A, MD   237 mL at 03/26/20  1400  . ferrous sulfate tablet 325 mg  325 mg Oral Q breakfast Regalado, Belkys A, MD   325 mg at 03/27/20 0908  . hydrALAZINE (APRESOLINE) tablet 10 mg  10 mg Oral Q8H PRN Regalado, Belkys A, MD      . magnesium citrate solution 1 Bottle  1 Bottle Oral Once PRN Wylene Simmer, MD      . melatonin tablet 5 mg  5 mg Oral QHS Wylene Simmer, MD   5 mg at 03/26/20 2133  . memantine (NAMENDA) tablet 10 mg  10 mg Oral BID Wylene Simmer, MD   10 mg at 03/27/20 0909  . mirtazapine (REMERON) tablet 7.5 mg  7.5 mg Oral QHS Wylene Simmer, MD   7.5 mg at 03/26/20 2134  . morphine 2 MG/ML injection 0.5-1 mg  0.5-1 mg Intravenous Q2H PRN Wylene Simmer, MD      . ondansetron [facility]) tablet 4 mg  4 mg Oral Q6H PRN Wylene Simmer, MD       Or  . ondansetron (ZOFRAN) injection 4 mg  4 mg Intravenous Q6H PRN Wylene Simmer, MD      . pantoprazole (PROTONIX) EC tablet 40 mg  40 mg Oral Daily Wylene Simmer, MD   40 mg at 03/27/20 0909  . polyethylene glycol (MIRALAX / GLYCOLAX) packet 17 g  17 g Oral Daily PRN Wylene Simmer, MD      . QUEtiapine (SEROQUEL) tablet 12.5 mg  12.5 mg Oral QHS PRN Wylene Simmer, MD   12.5 mg at 03/26/20 2237  . senna (SENOKOT) tablet 8.6 mg  1 tablet Oral BID Wylene Simmer, MD   8.6 mg at 03/27/20 0908  . senna-docusate (Senokot-S) tablet 1 tablet  1 tablet Oral QHS PRN Wylene Simmer, MD      . simvastatin (ZOCOR) tablet 20 mg  20 mg Oral q1800 Wylene Simmer, MD   20 mg at 03/26/20 1700  . traMADol (ULTRAM) tablet 50 mg  50 mg Oral Q6H PRN Wylene Simmer, MD   50 mg at 03/26/20 2137     Discharge Medications: Please see discharge summary for a list of discharge medications.  Relevant Imaging Results:  Relevant Lab Results:   Additional Information SSN [id number]  Vinie Sill, [place]

## 2020-03-28 LAB — COMPREHENSIVE METABOLIC PANEL
ALT: 13 U/L (ref 0–44)
AST: 16 U/L (ref 15–41)
Albumin: 2.6 g/dL — ABNORMAL LOW (ref 3.5–5.0)
Alkaline Phosphatase: 59 U/L (ref 38–126)
Anion gap: 10 (ref 5–15)
BUN: 18 mg/dL (ref 8–23)
CO2: 24 mmol/L (ref 22–32)
Calcium: 8.6 mg/dL — ABNORMAL LOW (ref 8.9–10.3)
Chloride: 107 mmol/L (ref 98–111)
Creatinine, Ser: 0.53 mg/dL (ref 0.44–1.00)
GFR calc Af Amer: >60 mL/min (ref >60)
GFR calc non Af Amer: >60 mL/min (ref >60)
Glucose, Bld: 106 mg/dL — ABNORMAL HIGH (ref 70–99)
Potassium: 3.7 mmol/L (ref 3.5–5.1)
Sodium: 141 mmol/L (ref 135–145)
Total Bilirubin: 1.8 mg/dL — ABNORMAL HIGH (ref 0.3–1.2)
Total Protein: 5 g/dL — ABNORMAL LOW (ref 6.5–8.1)

## 2020-03-28 LAB — CBC WITH DIFFERENTIAL/PLATELET
Abs Immature Granulocytes: 0.07 10*3/uL (ref 0.00–0.07)
Basophils Absolute: 0 10*3/uL (ref 0.0–0.1)
Basophils Relative: 0 %
Eosinophils Absolute: 0.1 10*3/uL (ref 0.0–0.5)
Eosinophils Relative: 1 %
HCT: 28.9 % — ABNORMAL LOW (ref 36.0–46.0)
Hemoglobin: 9.2 g/dL — ABNORMAL LOW (ref 12.0–15.0)
Immature Granulocytes: 1 %
Lymphocytes Relative: 16 %
Lymphs Abs: 1.3 10*3/uL (ref 0.7–4.0)
MCH: 30.3 pg (ref 26.0–34.0)
MCHC: 31.8 g/dL (ref 30.0–36.0)
MCV: 95.1 fL (ref 80.0–100.0)
Monocytes Absolute: 1.1 10*3/uL — ABNORMAL HIGH (ref 0.1–1.0)
Monocytes Relative: 13 %
Neutro Abs: 6 10*3/uL (ref 1.7–7.7)
Neutrophils Relative %: 69 %
Platelets: 174 10*3/uL (ref 150–400)
RBC: 3.04 MIL/uL — ABNORMAL LOW (ref 3.87–5.11)
RDW: 14.6 % (ref 11.5–15.5)
WBC: 8.7 10*3/uL (ref 4.0–10.5)
nRBC: 0 % (ref 0.0–0.2)

## 2020-03-28 LAB — TYPE AND SCREEN
ABO/RH(D): O POS
Antibody Screen: NEGATIVE
Unit division: 0

## 2020-03-28 LAB — BPAM RBC
Blood Product Expiration Date: 202108242359
ISSUE DATE / TIME: 202107221834
Unit Type and Rh: 5100

## 2020-03-28 LAB — PHOSPHORUS: Phosphorus: 3.1 mg/dL (ref 2.5–4.6)

## 2020-03-28 LAB — HAPTOGLOBIN: Haptoglobin: 232 mg/dL (ref 41–333)

## 2020-03-28 LAB — MAGNESIUM: Magnesium: 2 mg/dL (ref 1.7–2.4)

## 2020-03-28 NOTE — Progress Notes (Signed)
PROGRESS NOTE    Chelsea Jordan  AQT:[id number] DOB: 09/24/1926 DOA: 03/23/2020 PCP: Virgie Dad, MD     Brief Narrative:  86 year old WF PMHx Advanced Dmentia, chronic ambulatory dysfunction, HTN, GIST tumor, Aortic Aneurysmal, HLD,   Presents after mechanical fall and left hip pain.  Patient lives at assisted living facility and uses a cane to assist with ambulation.  The morning of admission patient tripped over and fell on the left hip.  Patient was found to have acute left side intertrochanteric fracture commuted.  Patient s/p LEFT Hip intertrochanteric fracture with intramedullary nail on 03/23/2020 by Dr. Doran Durand.  Post operative course complicated by acute blood loss anemia post surgery. Plan is to repeat cbc this afternoon. If hb decreasing , patient might need one unit PRBC>    Subjective: 7/23 afebrile overnight, though slightly elevated temp 37.9 C.  Continued episodes of asymptomatic bradycardia.    Assessment & Plan: Covid vaccination;   Active Problems:   Hip fracture, left, closed, initial encounter ([place])   Hip fracture ([place])   -Left intertrochanteric comminuted fracture: -Underwent open treatment of left hip intertrochanteric fracture with intramedullary nail by Dr. Doran Durand.  -Lovenox for DVT prophylaxis while admitted -81 mg ASA BID upon D/C -Pain management. -2 week outpatient post-op visit with Dr. Doran Durand -7/23 discharge to SNF in a.m.    A fib;/Asymptomatic Bradycardia -Heart rate controlled.  -ECHO; ejection fraction 55 to 60%.  The left ventricle has normal function tricuspid valve with mild to moderate rehabilitation. -7/23 was able to monitor patient today patient sleeping during her bradycardic episodes, HR picks up once patient aroused.   Essential HTN/ -Hydralazine 10 mg TID  Peripheral edema -Resolved    Dementia (advanced) -Namenda 10 mg  BID -Remeron 7.5 mg daily -Seroquel 12.5 mg PRN  Acute blood loss anemia post surgery  (expected Hg 12 on admission).   Recent Labs  Lab 03/25/20 0606 03/25/20 1418 03/26/20 0342 03/27/20 0401 03/28/20 0740  HGB 7.8* 7.4* 7.7* 7.0* 9.2*  -Appears hemoglobin is stabilized transfuse for hemoglobin<7 -Occult blood pending -Started on ferrous sulfate 325 mg daily -Vitamin C 500 mg daily -Anemia panel; invalid secondary to patient being transfused -7/22 transfuse 1 unit PRBC -Haptoglobin/LDH pending   DVT prophylaxis: Lovenox Code Status: DNR Family Communication:  Status is: Inpatient    Dispo: The patient is from: Assisted living facility              Anticipated d/c is to: SNF?              Anticipated d/c date is: Per surgery              Patient currently unstable      Consultants:  Orthopedic surgery   Procedures/Significant Events:  7/18 Open treatment of left hip intertrochanteric fracture with intramedullary nail 7/22 transfuse 1 unit PRBC   I have personally reviewed and interpreted all radiology studies and my findings are as above.  VENTILATOR SETTINGS:    Cultures 7/18 SARS, coronavirus negative  Antimicrobials:    Devices    LINES / TUBES:      Continuous Infusions:   Objective: Vitals:   03/27/20 2250 03/27/20 2332 03/28/20 0314 03/28/20 0755  BP: 115/81 117/67 115/83 (!) 115/91  Pulse: 55 53 55 50  Resp: 22 22 21 19   Temp: 98.8 F (37.1 C) 100.3 F (37.9 C) 99.4 F (37.4 C) 98.5 F (36.9 C)  TempSrc: Oral Axillary Axillary Axillary  SpO2: 92% 93% 93% 95%  Weight:      Height:        Intake/Output Summary (Last 24 hours) at 03/28/2020 1132 Last data filed at 03/28/2020 0500 Gross per 24 hour  Intake 966 ml  Output 170 ml  Net 796 ml   Filed Weights   03/23/20 2200  Weight: 59.4 kg   Physical Exam:  General: Sleepy but arousable.  When aroused states does not feel like being awake.  No acute respiratory distress Eyes: negative scleral hemorrhage, negative anisocoria, negative icterus ENT: Negative  Runny nose, negative gingival bleeding, Neck:  Negative scars, masses, torticollis, lymphadenopathy, JVD Lungs: Clear to auscultation bilaterally without wheezes or crackles Cardiovascular: Regular rate and rhythm without murmur gallop or rub normal S1 and S2 Abdomen: negative abdominal pain, nondistended, positive soft, bowel sounds, no rebound, no ascites, no appreciable mass Extremities: No significant cyanosis, clubbing, or edema bilateral lower extremities Skin: Negative rashes, lesions, ulcers Psychiatric: Unable to evaluate secondary to dementia  Central nervous system: Spontaneously moves all extremities does not follow commands.  .     Data Reviewed: Care during the described time interval was provided by me .  I have reviewed this patient's available data, including medical history, events of note, physical examination, and all test results as part of my evaluation.  CBC: Recent Labs  Lab 03/23/20 1145 03/24/20 0359 03/25/20 0606 03/25/20 1418 03/26/20 0342 03/27/20 0401 03/28/20 0740  WBC 9.2   < > 12.7* 13.2* 11.8* 8.3 8.7  NEUTROABS 7.7  --   --   --   --  5.3 6.0  HGB 12.4   < > 7.8* 7.4* 7.7* 7.0* 9.2*  HCT 40.3   < > 24.7* 23.7* 24.4* 22.6* 28.9*  MCV 96.4   < > 95.0 95.6 96.8 97.8 95.1  PLT 150   < > 131* 134* 134* 150 174   < > = values in this interval not displayed.   Basic Metabolic Panel: Recent Labs  Lab 03/23/20 1145 03/23/20 2213 03/24/20 0359 03/25/20 0606 03/26/20 0950 03/27/20 0401 03/28/20 0740  NA   < >  --  140 141 140 142 141  K   < >  --  4.0 4.1 3.5 3.9 3.7  CL   < >  --  104 105 104 107 107  CO2   < >  --  27 28 28 29 24   GLUCOSE   < >  --  177* 116* 122* 96 106*  BUN   < >  --  17 23 17 15 18   CREATININE   < >  --  0.73 0.64 0.55 0.54 0.53  CALCIUM   < >  --  8.4* 8.9 8.6* 8.3* 8.6*  MG  --  1.8  --   --  1.8 1.8 2.0  PHOS  --  5.0*  --   --  2.8 3.1 3.1   < > = values in this interval not displayed.   GFR: Estimated  Creatinine Clearance: 36.9 mL/min (by C-G formula based on SCr of 0.53 mg/dL). Liver Function Tests: Recent Labs  Lab 03/26/20 0950 03/27/20 0401 03/28/20 0740  AST 18 16 16   ALT 15 15 13   ALKPHOS 59 57 59  BILITOT 1.1 1.4* 1.8*  PROT 5.0* 4.5* 5.0*  ALBUMIN 2.9* 2.5* 2.6*   No results for input(s): LIPASE, AMYLASE in the last 168 hours. No results for input(s): AMMONIA in the last 168 hours. Coagulation Profile: Recent Labs  Lab 03/23/20 1145  INR 1.1  Cardiac Enzymes: No results for input(s): CKTOTAL, CKMB, CKMBINDEX, TROPONINI in the last 168 hours. BNP (last 3 results) No results for input(s): PROBNP in the last 8760 hours. HbA1C: No results for input(s): HGBA1C in the last 72 hours. CBG: No results for input(s): GLUCAP in the last 168 hours. Lipid Profile: No results for input(s): CHOL, HDL, LDLCALC, TRIG, CHOLHDL, LDLDIRECT in the last 72 hours. Thyroid Function Tests: No results for input(s): TSH, T4TOTAL, FREET4, T3FREE, THYROIDAB in the last 72 hours. Anemia Panel: Recent Labs    03/25/20 1912 03/26/20 1207  VITAMINB12 325 314  FOLATE  --  11.4  FERRITIN  --  109  TIBC  --  195*  IRON  --  34  RETICCTPCT  --  4.3*   Sepsis Labs: No results for input(s): PROCALCITON, LATICACIDVEN in the last 168 hours.  Recent Results (from the past 240 hour(s))  SARS Coronavirus 2 by RT PCR (hospital order, performed in [facility] hospital lab) Nasopharyngeal Nasopharyngeal Swab     Status: None   Collection Time: 03/23/20 12:10 PM   Specimen: Nasopharyngeal Swab  Result Value Ref Range Status   SARS Coronavirus 2 NEGATIVE NEGATIVE Final    Comment: (NOTE) SARS-CoV-2 target nucleic acids are NOT DETECTED.  The SARS-CoV-2 RNA is generally detectable in upper and lower respiratory specimens during the acute phase of infection. The lowest concentration of SARS-CoV-2 viral copies this assay can detect is 250 copies / mL. A negative result does not preclude  SARS-CoV-2 infection and should not be used as the sole basis for treatment or other patient management decisions.  A negative result may occur with improper specimen collection / handling, submission of specimen other than nasopharyngeal swab, presence of viral mutation(s) within the areas targeted by this assay, and inadequate number of viral copies (<250 copies / mL). A negative result must be combined with clinical observations, patient history, and epidemiological information.  Fact Sheet for Patients:   StrictlyIdeas.no  Fact Sheet for Healthcare Providers: BankingDealers.co.za  This test is not yet approved or  cleared by the [place] FDA and has been authorized for detection and/or diagnosis of SARS-CoV-2 by FDA under an Emergency Use Authorization (EUA).  This EUA will remain in effect (meaning this test can be used) for the duration of the COVID-19 declaration under Section 564(b)(1) of the Act, 21 U.S.C. section 360bbb-3(b)(1), unless the authorization is terminated or revoked sooner.  Performed at [facility], [place] [address] [place], [place] [address]   MRSA PCR Screening     Status: None   Collection Time: 03/23/20 10:21 PM   Specimen: Nasal Mucosa; Nasopharyngeal  Result Value Ref Range Status   MRSA by PCR NEGATIVE NEGATIVE Final    Comment:        The GeneXpert MRSA Assay (FDA approved for NASAL specimens only), is one component of a comprehensive MRSA colonization surveillance program. It is not intended to diagnose MRSA infection nor to guide or monitor treatment for MRSA infections. Performed at [facility], [place] [address], [place], [place] [address]   SARS CORONAVIRUS 2 (TAT 6-24 HRS) Nasopharyngeal Nasopharyngeal Swab     Status: None   Collection Time: 03/27/20 12:58 PM   Specimen: Nasopharyngeal Swab  Result Value Ref Range Status   SARS Coronavirus 2 NEGATIVE NEGATIVE Final     Comment: (NOTE) SARS-CoV-2 target nucleic acids are NOT DETECTED.  The SARS-CoV-2 RNA is generally detectable in upper and lower respiratory specimens during the acute  phase of infection. Negative results do not preclude SARS-CoV-2 infection, do not rule out co-infections with other pathogens, and should not be used as the sole basis for treatment or other patient management decisions. Negative results must be combined with clinical observations, patient history, and epidemiological information. The expected result is Negative.  Fact Sheet for Patients: SugarRoll.be  Fact Sheet for Healthcare Providers: https://www.Pavel Gadd-mathews.com/  This test is not yet approved or cleared by the [place] FDA and  has been authorized for detection and/or diagnosis of SARS-CoV-2 by FDA under an Emergency Use Authorization (EUA). This EUA will remain  in effect (meaning this test can be used) for the duration of the COVID-19 declaration under Se ction 564(b)(1) of the Act, 21 U.S.C. section 360bbb-3(b)(1), unless the authorization is terminated or revoked sooner.  Performed at [facility], [place] [address], [place], [place] [address]          Radiology Studies: No results found.      Scheduled Meds: . vitamin C  500 mg Oral Daily  . aspirin EC  81 mg Oral Daily  . docusate sodium  100 mg Oral BID  . enoxaparin (LOVENOX) injection  40 mg Subcutaneous Q24H  . feeding supplement (ENSURE ENLIVE)  237 mL Oral Q1500  . ferrous sulfate  325 mg Oral Q breakfast  . melatonin  5 mg Oral QHS  . memantine  10 mg Oral BID  . mirtazapine  7.5 mg Oral QHS  . pantoprazole  40 mg Oral Daily  . senna  1 tablet Oral BID  . simvastatin  20 mg Oral q1800   Continuous Infusions:   LOS: 5 days    Time spent:40 min    Decorey Wahlert, Geraldo Docker, MD [facility] Pager [phone]  If 7PM-7AM, please contact  night-coverage [email or web address] Password [facility] 03/28/2020, 11:32 AM

## 2020-03-29 ENCOUNTER — Telehealth: Payer: Self-pay | Admitting: Internal Medicine

## 2020-03-29 LAB — COMPREHENSIVE METABOLIC PANEL
ALT: 14 U/L (ref 0–44)
AST: 17 U/L (ref 15–41)
Albumin: 2.6 g/dL — ABNORMAL LOW (ref 3.5–5.0)
Alkaline Phosphatase: 67 U/L (ref 38–126)
Anion gap: 9 (ref 5–15)
BUN: 17 mg/dL (ref 8–23)
CO2: 25 mmol/L (ref 22–32)
Calcium: 8.6 mg/dL — ABNORMAL LOW (ref 8.9–10.3)
Chloride: 107 mmol/L (ref 98–111)
Creatinine, Ser: 0.56 mg/dL (ref 0.44–1.00)
GFR calc Af Amer: >60 mL/min (ref >60)
GFR calc non Af Amer: >60 mL/min (ref >60)
Glucose, Bld: 107 mg/dL — ABNORMAL HIGH (ref 70–99)
Potassium: 3.7 mmol/L (ref 3.5–5.1)
Sodium: 141 mmol/L (ref 135–145)
Total Bilirubin: 1.8 mg/dL — ABNORMAL HIGH (ref 0.3–1.2)
Total Protein: 5 g/dL — ABNORMAL LOW (ref 6.5–8.1)

## 2020-03-29 LAB — CBC WITH DIFFERENTIAL/PLATELET
Abs Immature Granulocytes: 0.07 10*3/uL (ref 0.00–0.07)
Basophils Absolute: 0 10*3/uL (ref 0.0–0.1)
Basophils Relative: 0 %
Eosinophils Absolute: 0.1 10*3/uL (ref 0.0–0.5)
Eosinophils Relative: 1 %
HCT: 29.6 % — ABNORMAL LOW (ref 36.0–46.0)
Hemoglobin: 9.4 g/dL — ABNORMAL LOW (ref 12.0–15.0)
Immature Granulocytes: 1 %
Lymphocytes Relative: 13 %
Lymphs Abs: 1.2 10*3/uL (ref 0.7–4.0)
MCH: 30.3 pg (ref 26.0–34.0)
MCHC: 31.8 g/dL (ref 30.0–36.0)
MCV: 95.5 fL (ref 80.0–100.0)
Monocytes Absolute: 1.1 10*3/uL — ABNORMAL HIGH (ref 0.1–1.0)
Monocytes Relative: 12 %
Neutro Abs: 6.6 10*3/uL (ref 1.7–7.7)
Neutrophils Relative %: 73 %
Platelets: 201 10*3/uL (ref 150–400)
RBC: 3.1 MIL/uL — ABNORMAL LOW (ref 3.87–5.11)
RDW: 14.5 % (ref 11.5–15.5)
WBC: 9.1 10*3/uL (ref 4.0–10.5)
nRBC: 0.2 % (ref 0.0–0.2)

## 2020-03-29 LAB — PHOSPHORUS: Phosphorus: 3.2 mg/dL (ref 2.5–4.6)

## 2020-03-29 LAB — MAGNESIUM: Magnesium: 1.9 mg/dL (ref 1.7–2.4)

## 2020-03-29 MED ORDER — ONDANSETRON HCL 4 MG PO TABS: 4.0000 mg | ORAL_TABLET | Freq: Four times a day (QID) | ORAL | 0 refills | Status: AC | PRN

## 2020-03-29 MED ORDER — BISACODYL 10 MG RE SUPP: 10.0000 mg | Freq: Every day | RECTAL | 0 refills | Status: DC | PRN

## 2020-03-29 MED ORDER — ASCORBIC ACID 500 MG PO TABS: 500.0000 mg | ORAL_TABLET | Freq: Every day | ORAL | 0 refills | Status: DC

## 2020-03-29 MED ORDER — FERROUS SULFATE 325 (65 FE) MG PO TABS: 325.0000 mg | ORAL_TABLET | Freq: Every day | ORAL | 0 refills | Status: DC

## 2020-03-29 MED ORDER — HYDRALAZINE HCL 10 MG PO TABS: 10.0000 mg | ORAL_TABLET | Freq: Three times a day (TID) | ORAL | 0 refills | Status: DC | PRN

## 2020-03-29 NOTE — TOC Transition Note (Signed)
Transition of Care [facility]) - CM/SW Discharge Note   Patient Details  Name: Chelsea Jordan MRN: [id number] Date of Birth: 13-Aug-1927  [facility] CM/SW Contact:  Emeterio Reeve, [place] Phone Number: 03/29/2020, 11:09 AM   Clinical Narrative:     Pt is discharging to [facility] room N2 via [place]. PTs daughter [name] has been notified. Pts [name] is [phone].  Nurse to call report to [phone] and ask for healthcare.   Final next level of care: Skilled Nursing Facility Barriers to Discharge: Barriers Resolved   Patient Goals and CMS Choice   CMS Medicare.gov Compare Post Acute Care list provided to:: Patient Choice offered to / list presented to : Patient  Discharge Placement              Patient [facility]: [facility] Patient to be transferred to facility by: [facility] Name of family member notified: [name]- daughter Patient and family notified of of transfer: 03/29/20  Discharge Plan and Services In-house Referral: Clinical Social Work                                   Social Determinants of Health ([place]) Interventions     Readmission Risk Interventions No flowsheet data found.   Emeterio Reeve, Latanya Presser, [place] Social Worker [phone]

## 2020-03-29 NOTE — [place] Notes (Signed)
Report called to [facility]  N2 RN receiving patient. Patient readied for transfer to facility. Covid test 03/27/20 negative. IV removed. Purewick removed. Patient alert but confused. Follows commands. MEWS GREEN. Daughter was informed of transfer via Education officer, museum. Takes meds whole. Needs set up for meals but can feed self. Tylenol 500 mg given this am for back pain. Pt dozes off easily. Simmie Davies RN

## 2020-03-29 NOTE — Discharge Summary (Signed)
Physician Discharge Summary  Chelsea Jordan ZDG:[id number] DOB: [birth year] DOA: 03/23/2020  PCP: Virgie Dad, MD  Admit date: 03/23/2020 Discharge date: 03/29/2020  Time spent: 30 minutes  Recommendations for Outpatient Follow-up:   Left intertrochanteric comminuted fracture: -Underwentopen treatment of left hip intertrochanteric fracture with intramedullary nail by Dr. Doran Durand. -Lovenox for DVT prophylaxis while admitted -81 mg ASA BID upon D/C -Pain management. -2 week outpatient post-op visit with Dr. Doran Durand -7/23 discharge to SNF in a.m.    A fib;/Asymptomatic Bradycardia -Heart rate controlled.  -ECHO;ejection fraction 55 to 60%. The left ventricle has normal function tricuspid valve with mild to moderate rehabilitation. -7/23 was able to monitor patient today patient sleeping during her bradycardic episodes, HR picks up once patient aroused.   Essential HTN/ -Hydralazine 10 mg TID  Peripheral edema -Resolved    Dementia (advanced) -Namenda 10 mg  BID -Remeron 7.5 mg daily -Seroquel 12.5 mg PRN  Acute blood loss anemia post surgery (expected Hg 12 on admission).  Recent Labs  Lab 03/25/20 1418 03/26/20 0342 03/27/20 0401 03/28/20 0740 03/29/20 0711  HGB 7.4* 7.7* 7.0* 9.2* 9.4*  -Appears hemoglobin is stabilized transfuse for hemoglobin<7 -Occult blood pending -Started on ferrous sulfate 325 mg daily -Vitamin C 500 mg daily -Anemia panel; invalid secondary to patient being transfused -7/22 transfuse 1 unit PRBC -Haptoglobin/LDH pending    Discharge Diagnoses:  Active Problems:   Hip fracture, left, closed, initial encounter ([place])   Hip fracture [facility])   Discharge Condition: Stable  Diet recommendation: Regular  Filed Weights   03/23/20 2200  Weight: 59.4 kg    History of present illness:  86 year old WF PMHx Advanced Dmentia, chronic ambulatory dysfunction, HTN, GIST tumor, Aortic Aneurysmal, HLD,   Presents after mechanical fall and  left hip pain. Patient lives at assisted living facility and uses a cane to assist with ambulation. The morning of admission patient tripped over and fell on the left hip. Patient was found to have acute left side intertrochanteric fracture commuted.  Patient s/p LEFT Hip intertrochanteric fracture with intramedullary nail on 03/23/2020 by Dr. Doran Durand. Post operative course complicated by acute blood loss anemia post surgery. Plan is to repeat cbc this afternoon. If hb decreasing , patient might need one unit [facility] Course:  See above   Procedures: 7/18 Open treatment of left hip intertrochanteric fracture with intramedullary nail 7/22 transfuse 1 unit PRBC   Consultations: Orthopedic surgery   Cultures  7/18 SARS, coronavirus negative    Discharge Exam: Vitals:   03/28/20 2319 03/29/20 0310 03/29/20 0314 03/29/20 0817  BP: (!) 156/80 (!) 165/67  115/83  Pulse: 81  56 59  Resp: 18 18 17 22   Temp: 98.5 F (36.9 C) 98.7 F (37.1 C)  98.2 F (36.8 C)  TempSrc: Oral Oral  Oral  SpO2: 97%  94% 93%  Weight:      Height:        General: Sleepy but arousable.  When aroused states does not feel like being awake.  No acute respiratory distress Eyes: negative scleral hemorrhage, negative anisocoria, negative icterus ENT: Negative Runny nose, negative gingival bleeding, Neck:  Negative scars, masses, torticollis, lymphadenopathy, JVD Lungs: Clear to auscultation bilaterally without wheezes or crackles Cardiovascular: Regular rate and rhythm without murmur gallop or rub normal S1 and S2 Abdomen: negative abdominal pain, nondistended, positive soft, bowel sounds, no rebound, no ascites, no appreciable mass Extremities: No significant cyanosis, clubbing, or edema bilateral lower extremities   Discharge Instructions  Discharge  Instructions    Weight bearing as tolerated   Complete by: As directed    Laterality: left   Extremity: Lower     Allergies as of  03/29/2020      Reactions   Pentothal [thiopental] Nausea And Vomiting   Codeine    Levofloxacin    Oxycodone-acetaminophen    Sulfonamide Derivatives    REACTION: GI upset/nausea/vomiting   Tape Rash   Per patient adhesive tape      Medication List    STOP taking these medications   divalproex 125 MG DR tablet Commonly known as: DEPAKOTE     TAKE these medications   ascorbic acid 500 MG tablet Commonly known as: VITAMIN C Take 1 tablet (500 mg total) by mouth daily.   aspirin EC 81 MG tablet Take 1 tablet (81 mg total) by mouth 2 (two) times daily.   bisacodyl 10 MG suppository Commonly known as: DULCOLAX Place 1 suppository (10 mg total) rectally daily as needed for moderate constipation.   docusate sodium 100 MG capsule Commonly known as: Colace Take 1 capsule (100 mg total) by mouth 2 (two) times daily. While taking narcotic pain medicine.   esomeprazole 40 MG capsule Commonly known as: NEXIUM TAKE 1 CAPSULE BY MOUTH 30-60 MIN BEFORE YOUR FIRST AND LAST MEAL OF THE DAY What changed: See the new instructions.   ferrous sulfate 325 (65 FE) MG tablet Take 1 tablet (325 mg total) by mouth daily with breakfast.   hydrALAZINE 10 MG tablet Commonly known as: APRESOLINE Take 1 tablet (10 mg total) by mouth every 8 (eight) hours as needed (SBP more than 150).   melatonin 5 MG Tabs Take 5 mg by mouth at bedtime.   memantine 10 MG tablet Commonly known as: NAMENDA TAKE 1 TABLET BY MOUTH TWICE A DAY   mirtazapine 7.5 MG tablet Commonly known as: REMERON Take 7.5 mg by mouth at bedtime.   ondansetron 4 MG tablet Commonly known as: ZOFRAN Take 1 tablet (4 mg total) by mouth every 6 (six) hours as needed for nausea.   oxyCODONE 5 MG immediate release tablet Commonly known as: Roxicodone Take 1 tablet (5 mg total) by mouth every 4 (four) hours as needed for up to 5 days for moderate pain or severe pain.   QUEtiapine 12.5 mg Tabs tablet Commonly known as:  SEROQUEL Take 12.5 mg by mouth at bedtime.   senna 8.6 MG Tabs tablet Commonly known as: SENOKOT Take 2 tablets (17.2 mg total) by mouth 2 (two) times daily.   simvastatin 5 MG tablet Commonly known as: ZOCOR TAKE 1 TABLET BY MOUTH EVERYDAY AT BEDTIME What changed: Another medication with the same name was removed. Continue taking this medication, and follow the directions you see here.            Discharge Care Instructions  (From admission, onward)         Start     Ordered   03/24/20 0000  Weight bearing as tolerated       Question Answer Comment  Laterality left   Extremity Lower      03/24/20 0725         Allergies  Allergen Reactions  . Pentothal [Thiopental] Nausea And Vomiting  . Codeine   . Levofloxacin   . Oxycodone-Acetaminophen   . Sulfonamide Derivatives     REACTION: GI upset/nausea/vomiting  . Tape Rash    Per patient adhesive tape    Follow-up Information    Wylene Simmer, MD.  Schedule an appointment as soon as possible for a visit in 2 week(s).   Specialty: Orthopedic Surgery Contact information: [address] [place] [address] [place] [address] [phone]                The results of significant diagnostics from this hospitalization (including imaging, microbiology, ancillary and laboratory) are listed below for reference.    Significant Diagnostic Studies: DG Chest 1 View  Result Date: 03/23/2020 CLINICAL DATA:  [place] this morning. EXAM: CHEST  1 VIEW COMPARISON:  04/17/2015 FINDINGS: Interval enlargement of the cardiac silhouette with increased prominence of the pulmonary vasculature and interstitial markings. Diffuse osteopenia is again demonstrated as well as mild moderate thoracolumbar scoliosis and degenerative changes. IMPRESSION: Interval cardiomegaly and mild changes of congestive heart failure. Electronically Signed   By: Claudie Revering M.D.   On: 03/23/2020 12:29   DG C-Arm 1-60 Min  Result Date: 03/23/2020 CLINICAL  DATA:  Left IM nail placement. EXAM: DG C-ARM 1-60 MIN; LEFT FEMUR 2 VIEWS FLUOROSCOPY TIME:  Fluoroscopy Time:  46 seconds COMPARISON:  March 23, 2020 FINDINGS: Intraoperative fluoroscopic images from IM nail and screw fixation of left sub trochanteric femoral fracture fixation demonstrate placement of orthopedic hardware with normal alignment and near anatomic osseous alignment. IMPRESSION: Intraoperative fluoroscopic images from IM nail and screw fixation of left sub trochanteric femoral fracture fixation. Electronically Signed   By: Fidela Salisbury M.D.   On: 03/23/2020 19:05   ECHOCARDIOGRAM COMPLETE  Result Date: 03/24/2020    ECHOCARDIOGRAM REPORT   Patient Name:   Chelsea Jordan Date of Exam: 03/24/2020 Medical Rec #:  [id number]     Height:       60.0 in Accession #:    [id number]    Weight:       131.0 lb Date of Birth:  04-04-1927      BSA:          1.559 m Patient Age:    89 years      BP:           105/56 mmHg Patient Gender: F             HR:           59 bpm. Exam Location:  Inpatient Procedure: 2D Echo, Color Doppler, Cardiac Doppler and 3D Echo Indications:    R01.1 Murmur  History:        Patient has prior history of Echocardiogram examinations, most                 recent 03/16/2017. Risk Factors:Hypertension and Dyslipidemia.  Sonographer:    Raquel Sarna Senior RDCS Referring Phys: [place] [facility]ITT IMPRESSIONS  1. Left ventricular ejection fraction, by estimation, is 55 to 60%. The left ventricle has normal function. The left ventricle has no regional wall motion abnormalities. Left ventricular diastolic function could not be evaluated.  2. Right ventricular systolic function is normal. The right ventricular size is normal. There is mildly elevated pulmonary artery systolic pressure.  3. Tracings suggest atrial flutter with variable conduction, recommend clinical correlation.. Left atrial size was severely dilated.  4. Right atrial size was moderately dilated.  5. The mitral valve is normal in  structure. Mild mitral valve regurgitation. No evidence of mitral stenosis.  6. Tricuspid valve regurgitation is mild to moderate.  7. The aortic valve is tricuspid. Aortic valve regurgitation is mild. Mild aortic valve sclerosis is present, with no evidence of aortic valve stenosis.  8. Aortic dilatation noted. There  is moderate dilatation of the ascending aorta measuring 45 mm.  9. The inferior vena cava is dilated in size with >50% respiratory variability, suggesting right atrial pressure of 8 mmHg. FINDINGS  Left Ventricle: Left ventricular ejection fraction, by estimation, is 55 to 60%. The left ventricle has normal function. The left ventricle has no regional wall motion abnormalities. The left ventricular internal cavity size was normal in size. There is  no left ventricular hypertrophy. Left ventricular diastolic function could not be evaluated due to atrial fibrillation. Left ventricular diastolic function could not be evaluated. Right Ventricle: The right ventricular size is normal. No increase in right ventricular wall thickness. Right ventricular systolic function is normal. There is mildly elevated pulmonary artery systolic pressure. The tricuspid regurgitant velocity is 2.77  m/s, and with an assumed right atrial pressure of 8 mmHg, the estimated right ventricular systolic pressure is 42.7 mmHg. Left Atrium: Tracings suggest atrial flutter with variable conduction, recommend clinical correlation. Left atrial size was severely dilated. Right Atrium: Right atrial size was moderately dilated. Pericardium: There is no evidence of pericardial effusion. Mitral Valve: The mitral valve is normal in structure. There is mild thickening of the mitral valve leaflet(s). There is mild calcification of the mitral valve leaflet(s). Mild mitral annular calcification. Mild mitral valve regurgitation. No evidence of  mitral valve stenosis. Tricuspid Valve: The tricuspid valve is normal in structure. Tricuspid valve  regurgitation is mild to moderate. No evidence of tricuspid stenosis. Aortic Valve: The aortic valve is tricuspid. Aortic valve regurgitation is mild. Aortic regurgitation PHT measures 989 msec. Mild aortic valve sclerosis is present, with no evidence of aortic valve stenosis. There is mild calcification of the aortic valve. Aortic valve mean gradient measures 9.0 mmHg. Aortic valve peak gradient measures 19.2 mmHg. Aortic valve area, by VTI measures 1.73 cm. Pulmonic Valve: The pulmonic valve was not well visualized. Pulmonic valve regurgitation is not visualized. Aorta: Aortic dilatation noted. There is moderate dilatation of the ascending aorta measuring 45 mm. Venous: The inferior vena cava is dilated in size with greater than 50% respiratory variability, suggesting right atrial pressure of 8 mmHg. IAS/Shunts: The atrial septum is grossly normal.  LEFT VENTRICLE PLAX 2D LVIDd:         3.50 cm  Diastology LVIDs:         2.70 cm  LV e' lateral:   6.64 cm/s LV PW:         0.90 cm  LV E/e' lateral: 15.5 LV IVS:        1.10 cm  LV e' medial:    6.85 cm/s LVOT diam:     2.00 cm  LV E/e' medial:  15.0 LV SV:         67 LV SV Index:   43 LVOT Area:     3.14 cm  RIGHT VENTRICLE RV S prime:     14.40 cm/s TAPSE (M-mode): 1.8 cm LEFT ATRIUM             Index       RIGHT ATRIUM           Index LA diam:        3.70 cm 2.37 cm/m  RA Area:     23.90 cm LA Vol (A2C):   92.8 ml 59.52 ml/m RA Volume:   70.00 ml  44.90 ml/m LA Vol (A4C):   85.9 ml 55.09 ml/m LA Biplane Vol: 90.2 ml 57.85 ml/m  AORTIC VALVE AV Area (Vmax):    1.43  cm AV Area (Vmean):   1.63 cm AV Area (VTI):     1.73 cm AV Vmax:           219.00 cm/s AV Vmean:          139.000 cm/s AV VTI:            0.386 m AV Peak Grad:      19.2 mmHg AV Mean Grad:      9.0 mmHg LVOT Vmax:         99.90 cm/s LVOT Vmean:        72.100 cm/s LVOT VTI:          0.212 m LVOT/AV VTI ratio: 0.55 AI PHT:            989 msec  AORTA Ao Root diam: 3.50 cm Ao Asc diam:  4.45 cm  MITRAL VALVE                TRICUSPID VALVE MV Area (PHT): 2.93 cm     TR Peak grad:   30.7 mmHg MV Decel Time: 259 msec     TR Vmax:        277.00 cm/s MV E velocity: 103.00 cm/s MV A velocity: 25.30 cm/s   SHUNTS MV E/A ratio:  4.07         Systemic VTI:  0.21 m                             Systemic Diam: 2.00 cm Buford Dresser MD Electronically signed by Buford Dresser MD Signature Date/Time: 03/24/2020/3:53:08 PM    Final    DG Hip Unilat With Pelvis 2-3 Views Left  Result Date: 03/23/2020 CLINICAL DATA:  Left hip pain following a fall this morning. EXAM: DG HIP (WITH OR WITHOUT PELVIS) 2-3V LEFT COMPARISON:  11/04/2017. FINDINGS: Acute, comminuted left intertrochanteric fracture with varus angulation. Interval healing of the previously demonstrated left superior and inferior pubic ramus fractures. Atheromatous arterial calcifications. Diffuse osteopenia. IMPRESSION: Acute, comminuted left intertrochanteric fracture with varus angulation. Electronically Signed   By: Claudie Revering M.D.   On: 03/23/2020 12:27   DG FEMUR MIN 2 VIEWS LEFT  Result Date: 03/23/2020 CLINICAL DATA:  Left IM nail placement. EXAM: DG C-ARM 1-60 MIN; LEFT FEMUR 2 VIEWS FLUOROSCOPY TIME:  Fluoroscopy Time:  46 seconds COMPARISON:  March 23, 2020 FINDINGS: Intraoperative fluoroscopic images from IM nail and screw fixation of left sub trochanteric femoral fracture fixation demonstrate placement of orthopedic hardware with normal alignment and near anatomic osseous alignment. IMPRESSION: Intraoperative fluoroscopic images from IM nail and screw fixation of left sub trochanteric femoral fracture fixation. Electronically Signed   By: Fidela Salisbury M.D.   On: 03/23/2020 19:05    Microbiology: Recent Results (from the past 240 hour(s))  SARS Coronavirus 2 by RT PCR (hospital order, performed in [facility] hospital lab) Nasopharyngeal Nasopharyngeal Swab     Status: None   Collection Time: 03/23/20 12:10 PM    Specimen: Nasopharyngeal Swab  Result Value Ref Range Status   SARS Coronavirus 2 NEGATIVE NEGATIVE Final    Comment: (NOTE) SARS-CoV-2 target nucleic acids are NOT DETECTED.  The SARS-CoV-2 RNA is generally detectable in upper and lower respiratory specimens during the acute phase of infection. The lowest concentration of SARS-CoV-2 viral copies this assay can detect is 250 copies / mL. A negative result does not preclude SARS-CoV-2 infection and should not be used as the sole basis for  treatment or other patient management decisions.  A negative result may occur with improper specimen collection / handling, submission of specimen other than nasopharyngeal swab, presence of viral mutation(s) within the areas targeted by this assay, and inadequate number of viral copies (<250 copies / mL). A negative result must be combined with clinical observations, patient history, and epidemiological information.  Fact Sheet for Patients:   StrictlyIdeas.no  Fact Sheet for Healthcare Providers: BankingDealers.co.za  This test is not yet approved or  cleared by the [place] FDA and has been authorized for detection and/or diagnosis of SARS-CoV-2 by FDA under an Emergency Use Authorization (EUA).  This EUA will remain in effect (meaning this test can be used) for the duration of the COVID-19 declaration under Section 564(b)(1) of the Act, 21 U.S.C. section 360bbb-3(b)(1), unless the authorization is terminated or revoked sooner.  Performed at [facility], [place] [address], [place], [place] [address]   MRSA PCR Screening     Status: None   Collection Time: 03/23/20 10:21 PM   Specimen: Nasal Mucosa; Nasopharyngeal  Result Value Ref Range Status   MRSA by PCR NEGATIVE NEGATIVE Final    Comment:        The GeneXpert MRSA Assay (FDA approved for NASAL specimens only), is one component of a comprehensive MRSA colonization surveillance  program. It is not intended to diagnose MRSA infection nor to guide or monitor treatment for MRSA infections. Performed at [facility], [place] [address], [place], [place] [address]   SARS CORONAVIRUS 2 (TAT 6-24 HRS) Nasopharyngeal Nasopharyngeal Swab     Status: None   Collection Time: 03/27/20 12:58 PM   Specimen: Nasopharyngeal Swab  Result Value Ref Range Status   SARS Coronavirus 2 NEGATIVE NEGATIVE Final    Comment: (NOTE) SARS-CoV-2 target nucleic acids are NOT DETECTED.  The SARS-CoV-2 RNA is generally detectable in upper and lower respiratory specimens during the acute phase of infection. Negative results do not preclude SARS-CoV-2 infection, do not rule out co-infections with other pathogens, and should not be used as the sole basis for treatment or other patient management decisions. Negative results must be combined with clinical observations, patient history, and epidemiological information. The expected result is Negative.  Fact Sheet for Patients: SugarRoll.be  Fact Sheet for Healthcare Providers: https://www.-mathews.com/  This test is not yet approved or cleared by the [place] FDA and  has been authorized for detection and/or diagnosis of SARS-CoV-2 by FDA under an Emergency Use Authorization (EUA). This EUA will remain  in effect (meaning this test can be used) for the duration of the COVID-19 declaration under Se ction 564(b)(1) of the Act, 21 U.S.C. section 360bbb-3(b)(1), unless the authorization is terminated or revoked sooner.  Performed at [facility], [place] [address], [place], [place] [address]      Labs: Basic Metabolic Panel: Recent Labs  Lab 03/23/20 2213 03/24/20 0359 03/25/20 0606 03/26/20 0950 03/27/20 0401 03/28/20 0740 03/29/20 0711  NA  --    < > 141 140 142 141 141  K  --    < > 4.1 3.5 3.9 3.7 3.7  CL  --    < > 105 104 107 107 107  CO2  --    < > 28 28 29 24 25    GLUCOSE  --    < > 116* 122* 96 106* 107*  BUN  --    < > 23 17 15 18 17   CREATININE  --    < >  0.64 0.55 0.54 0.53 0.56  CALCIUM  --    < > 8.9 8.6* 8.3* 8.6* 8.6*  MG 1.8  --   --  1.8 1.8 2.0 1.9  PHOS 5.0*  --   --  2.8 3.1 3.1 3.2   < > = values in this interval not displayed.   Liver Function Tests: Recent Labs  Lab 03/26/20 0950 03/27/20 0401 03/28/20 0740 03/29/20 0711  AST 18 16 16 17   ALT 15 15 13 14   ALKPHOS 59 57 59 67  BILITOT 1.1 1.4* 1.8* 1.8*  PROT 5.0* 4.5* 5.0* 5.0*  ALBUMIN 2.9* 2.5* 2.6* 2.6*   No results for input(s): LIPASE, AMYLASE in the last 168 hours. No results for input(s): AMMONIA in the last 168 hours. CBC: Recent Labs  Lab 03/23/20 1145 03/24/20 0359 03/25/20 1418 03/26/20 0342 03/27/20 0401 03/28/20 0740 03/29/20 0711  WBC 9.2   < > 13.2* 11.8* 8.3 8.7 9.1  NEUTROABS 7.7  --   --   --  5.3 6.0 6.6  HGB 12.4   < > 7.4* 7.7* 7.0* 9.2* 9.4*  HCT 40.3   < > 23.7* 24.4* 22.6* 28.9* 29.6*  MCV 96.4   < > 95.6 96.8 97.8 95.1 95.5  PLT 150   < > 134* 134* 150 174 201   < > = values in this interval not displayed.   Cardiac Enzymes: No results for input(s): CKTOTAL, CKMB, CKMBINDEX, TROPONINI in the last 168 hours. BNP: BNP (last 3 results) No results for input(s): BNP in the last 8760 hours.  ProBNP (last 3 results) No results for input(s): PROBNP in the last 8760 hours.  CBG: No results for input(s): GLUCAP in the last 168 hours.     Signed:  Dia Crawford, MD [facility] [phone] pager

## 2020-03-29 NOTE — Telephone Encounter (Signed)
86 yo female being readmitted to [facility], now to rehab s/p left hip fracture surgery.  She is on bid aspirin dvt prophylaxis w/o stop date per nurse--advised to clarify with ortho (typically 4 wks, then back to daily if pt took before).  Pt to f/u with ortho in 2 wks.

## 2020-03-31 ENCOUNTER — Non-Acute Institutional Stay (SKILLED_NURSING_FACILITY): Payer: Medicare Other | Admitting: Nurse Practitioner

## 2020-03-31 ENCOUNTER — Encounter: Payer: Self-pay | Admitting: Nurse Practitioner

## 2020-03-31 DIAGNOSIS — S72001A Fracture of unspecified part of neck of right femur, initial encounter for closed fracture: Secondary | ICD-10-CM | POA: Diagnosis not present

## 2020-03-31 DIAGNOSIS — I10 Essential (primary) hypertension: Secondary | ICD-10-CM | POA: Diagnosis not present

## 2020-03-31 DIAGNOSIS — K5901 Slow transit constipation: Secondary | ICD-10-CM

## 2020-03-31 DIAGNOSIS — D500 Iron deficiency anemia secondary to blood loss (chronic): Secondary | ICD-10-CM

## 2020-03-31 DIAGNOSIS — F0391 Unspecified dementia with behavioral disturbance: Secondary | ICD-10-CM

## 2020-03-31 DIAGNOSIS — C49A0 Gastrointestinal stromal tumor, unspecified site: Secondary | ICD-10-CM

## 2020-03-31 NOTE — Assessment & Plan Note (Signed)
Continue Memantine, Mirtazapine, Quetiapine.

## 2020-03-31 NOTE — Assessment & Plan Note (Addendum)
138/79 today, continue Hydralazine 12m tid, update CMP/eGFR

## 2020-03-31 NOTE — Progress Notes (Signed)
Location:   SNF [place] Room Number: 2 Place of Service:  SNF (31) Provider: [facility] Rajan Burgard NP  Virgie Dad, MD  Patient Care Team: Virgie Dad, MD as PCP - General (Internal Medicine) Minus Breeding, MD as Referring Physician (Cardiology) Tanda Rockers, MD as Referring Physician (Pulmonary Disease) Gatha Mayer, MD as Referring Physician (Gastroenterology) Gaynelle Arabian, MD as Referring Physician (Orthopedic Surgery) Melida Quitter, MD as Referring Physician Jackolyn Confer, MD (General Surgery)  Extended Emergency Contact Information Primary Emergency Contact: [facility] Address: [place], [place] [address] [name] of [place] Phone: [phone] Mobile Phone: [phone] Relation: Spouse Secondary Emergency Contact: Marcina Millard of [place] Phone: [phone] Mobile Phone: [phone] Relation: Son  Code Status:  DNR Goals of care: Advanced Directive information Advanced Directives 03/31/2020  Does Patient Have a Medical Advance Directive? Yes  Type of Paramedic of [place];Living will;Out of facility DNR (pink MOST or yellow form)  Does patient want to make changes to medical advance directive? No - Patient declined  Copy of [place] in Chart? Yes - validated most recent copy scanned in chart (See row information)  Would patient like information on creating a medical advance directive? -  Pre-existing out of facility DNR order (yellow form or pink MOST form) Yellow form placed in chart (order not valid for inpatient use)     Chief Complaint  Patient presents with  . Acute Visit    Medication review    HPI:  Pt is a 86 y.o. female seen today for an acute visit for f/u hospitalization 03/23/20-03/29/20 for left intertrochanteric comminuted fracture, s/p IM by Dr. Doran Durand, takes ASA 77m bid for VTE risk reduction, prn Oxycodone for pain, tolerated.    Afib, Echo 55-60% EF  HTN, 138/79 today, takes Hydralazine 143mtid  Dementia, takes Memantine, Mirtazapine 7.58m108md, Quetiapine 12.58mg26mily prn  Post Op anemia, transfused 1 unit PRBC 03/27/20, takes Fe daily. Hgb 9.4 03/29/20  Constipation, takes Colace bid, prn Bisacodyl 10mg83mly, Senna II bid  GIST,  takes Esomeprazole 40mg 72mprn Zofran 4mg q639m  Past Medical History:  Diagnosis Date  . Anxiety   . Benign fundic gland polyps of stomach   . Benign gastrointestinal stromal tumor (GIST)   . Bronchitis   . Cancer (HCC)   [place]  excised about 8 days ago-remains with dressing intact.   . Chronic cystitis   . COLONIC POLYPS, HX OF   . Complication of anesthesia   . Diverticulosis   . DYSLIPIDEMIA   . Gastritis   . GERD (gastroesophageal reflux disease)   . Hemorrhoids   . Hiatal hernia   . HYPERTENSION   . Macular degeneration    optho q6mo - H56mor  . Meningioma (HCC)   .[place] PROLAPSE   . NEPHROLITHIASIS   . OSTEOARTHRITIS   . OSTEOPOROSIS   . PONV (postoperative nausea and vomiting)   . Thoracic aortic aneurysm (HCC)    4.1 cm 2015   Past Surgical History:  Procedure Laterality Date  . ABDOMINAL HYSTERECTOMY    . APPENDECTOMY    . BREAST SURGERY    . CARDIAC [place] in [place]  .[place] surgery  2000  . CHOLECYSTECTOMY    . COLONOSCOPY    . ESOPHAGOGASTRODUODENOSCOPY    . EUS N/A  05/16/2014   Procedure: UPPER ENDOSCOPIC ULTRASOUND (EUS) LINEAR;  Surgeon: Milus Banister, MD;  Location: [facility];  Service: Endoscopy;  Laterality: N/A;  . INSERTION OF MESH  07/04/2012   Procedure: INSERTION OF MESH;  Surgeon: Odis Hollingshead, MD;  Location: [facility];  Service: General;  Laterality: N/A;  . INTRAMEDULLARY (IM) NAIL INTERTROCHANTERIC Left 03/23/2020   Procedure: INTRAMEDULLARY (IM) NAIL INTERTROCHANTRIC HIP;  Surgeon: Wylene Simmer, MD;  Location: [facility];  Service: Orthopedics;  Laterality: Left;    . KNEE SURGERY    . LEG SKIN LESION  BIOPSY / EXCISION Left    8 days ago pending pathology, remains with dressing.  . SURGERY OF LIP  in 2002  . TONSILLECTOMY    . VENTRAL HERNIA REPAIR  07/04/2012   Procedure: HERNIA REPAIR VENTRAL ADULT;  Surgeon: Odis Hollingshead, MD;  Location: [facility];  Service: General;  Laterality: N/A;    Allergies  Allergen Reactions  . Pentothal [Thiopental] Nausea And Vomiting  . Codeine   . Levofloxacin   . Oxycodone-Acetaminophen   . Sulfonamide Derivatives     REACTION: GI upset/nausea/vomiting  . Tape Rash    Per patient adhesive tape    Allergies as of 03/31/2020      Reactions   Pentothal [thiopental] Nausea And Vomiting   Codeine    Levofloxacin    Oxycodone-acetaminophen    Sulfonamide Derivatives    REACTION: GI upset/nausea/vomiting   Tape Rash   Per patient adhesive tape      Medication List       Accurate as of March 31, 2020 11:59 PM. If you have any questions, ask your nurse or doctor.        ascorbic acid 500 MG tablet Commonly known as: VITAMIN C Take 1 tablet (500 mg total) by mouth daily.   aspirin EC 81 MG tablet Take 1 tablet (81 mg total) by mouth 2 (two) times daily.   bisacodyl 5 MG EC tablet Commonly known as: DULCOLAX Take 10 mg by mouth daily as needed for moderate constipation. What changed: Another medication with the same name was removed. Continue taking this medication, and follow the directions you see here. Changed by: Celvin Taney X Xander Jutras, NP   docusate sodium 100 MG capsule Commonly known as: Colace Take 1 capsule (100 mg total) by mouth 2 (two) times daily. While taking narcotic pain medicine.   esomeprazole 40 MG capsule Commonly known as: NEXIUM TAKE 1 CAPSULE BY MOUTH 30-60 MIN BEFORE YOUR FIRST AND LAST MEAL OF THE DAY What changed: See the new instructions.   ferrous sulfate 325 (65 FE) MG tablet Take 1 tablet (325 mg total) by mouth daily with breakfast.   hydrALAZINE 10 MG tablet Commonly  known as: APRESOLINE Take 1 tablet (10 mg total) by mouth every 8 (eight) hours as needed (SBP more than 150).   melatonin 5 MG Tabs Take 5 mg by mouth at bedtime.   memantine 10 MG tablet Commonly known as: NAMENDA TAKE 1 TABLET BY MOUTH TWICE A DAY   mirtazapine 7.5 MG tablet Commonly known as: REMERON Take 7.5 mg by mouth at bedtime.   ondansetron 4 MG tablet Commonly known as: ZOFRAN Take 1 tablet (4 mg total) by mouth every 6 (six) hours as needed for nausea.   QUEtiapine 25 MG tablet Commonly known as: SEROQUEL Take 12.5 mg by mouth at bedtime. 12.2m, oral, Once A Day What changed: Another medication with the same name was removed. Continue taking this  medication, and follow the directions you see here. Changed by: Wajiha Versteeg X Yisell Sprunger, NP   senna 8.6 MG Tabs tablet Commonly known as: SENOKOT Take 2 tablets (17.2 mg total) by mouth 2 (two) times daily.   simvastatin 10 MG tablet Commonly known as: ZOCOR Take 5 mg by mouth daily. What changed: Another medication with the same name was removed. Continue taking this medication, and follow the directions you see here. Changed by: Izac Faulkenberry X Lathaniel Legate, NP       Review of Systems  Constitutional: Positive for fatigue. Negative for appetite change and fever.  HENT: Positive for hearing loss. Negative for congestion and voice change.   Eyes: Negative for visual disturbance.  Respiratory: Negative for cough, shortness of breath and wheezing.   Cardiovascular: Positive for leg swelling.  Gastrointestinal: Negative for abdominal distention, abdominal pain and constipation.  Genitourinary: Negative for difficulty urinating, dysuria and urgency.  Musculoskeletal: Positive for arthralgias. Negative for gait problem.       Left hip  Skin: Positive for wound. Negative for color change.       Left hip surgical incisions x2  Neurological: Negative for dizziness, speech difficulty, weakness and numbness.       Dementia  Psychiatric/Behavioral:  Positive for agitation, behavioral problems and confusion. Negative for sleep disturbance. The patient is not nervous/anxious.     Immunization History  Administered Date(s) Administered  . Influenza Split 06/07/2011, 06/13/2012, 06/15/2013  . Influenza Whole 06/06/2009, 05/18/2010  . Influenza, High Dose Seasonal PF 06/15/2013, 06/23/2017, 06/22/2018, 04/30/2019  . Influenza-Unspecified 06/07/2015, 05/28/2016  . Moderna SARS-COVID-2 Vaccination 09/10/2019, 10/08/2019  . Pneumococcal Conjugate-13 07/12/2014  . Pneumococcal Polysaccharide-23 09/07/2007  . Td 09/07/2007  . Tdap 06/21/2016   Pertinent  Health Maintenance Due  Topic Date Due  . INFLUENZA VACCINE  04/06/2020  . DEXA SCAN  Completed  . PNA vac Low Risk Adult  Completed   Fall Risk  10/02/2019 09/26/2019 06/20/2019 02/28/2019 02/14/2019  Falls in the past year? 0 0 1 1 1   Number falls in past yr: 0 0 0 0 0  Injury with Fall? - - 1 1 1   Comment - - - - -  Risk Factor Category  - - - - -  Risk for fall due to : - - - - -  Follow up - - - - -   Functional Status Survey:    Vitals:   03/31/20 1526  BP: (!) 174/89  Pulse: 64  Resp: 20  Temp: (!) 97.3 F (36.3 C)  SpO2: 93%  Weight: 120 lb (54.4 kg)  Height: 5' 4"  (1.626 m)   Body mass index is 20.6 kg/m. Physical Exam Vitals and nursing note reviewed.  Constitutional:      Appearance: Normal appearance.  HENT:     Head: Normocephalic and atraumatic.     Mouth/Throat:     Mouth: Mucous membranes are moist.  Eyes:     Extraocular Movements: Extraocular movements intact.     Conjunctiva/sclera: Conjunctivae normal.     Pupils: Pupils are equal, round, and reactive to light.  Cardiovascular:     Rate and Rhythm: Normal rate and regular rhythm.     Heart sounds: Murmur heard.   Pulmonary:     Breath sounds: Normal breath sounds. No wheezing, rhonchi or rales.  Abdominal:     General: Bowel sounds are normal.     Palpations: Abdomen is soft.      Tenderness: There is no abdominal tenderness.  Musculoskeletal:  General: Tenderness present.     Cervical back: Normal range of motion and neck supple.     Right lower leg: No edema.     Left lower leg: Edema present.     Comments: Left hip pain.  Skin:    General: Skin is warm and dry.     Findings: Bruising present.     Comments: Left hip surgical incisions x2 covered in dressing, dry blood seen on dressing at the lower left hip incision. Swelling, ecchymoses from left flank down to the left lower leg. Slightly warmth peri incisions. Skin tears L+R arms, no s/s of infection.   Neurological:     General: No focal deficit present.     Mental Status: She is alert. Mental status is at baseline.     Motor: No weakness.     Coordination: Coordination normal.     Gait: Gait normal.     Comments: Oriented to person  Psychiatric:        Mood and Affect: Mood normal.        Behavior: Behavior normal.     Labs reviewed: Recent Labs    03/27/20 0401 03/28/20 0740 03/29/20 0711  NA 142 141 141  K 3.9 3.7 3.7  CL 107 107 107  CO2 29 24 25   GLUCOSE 96 106* 107*  BUN 15 18 17   CREATININE 0.54 0.53 0.56  CALCIUM 8.3* 8.6* 8.6*  MG 1.8 2.0 1.9  PHOS 3.1 3.1 3.2   Recent Labs    03/27/20 0401 03/28/20 0740 03/29/20 0711  AST 16 16 17   ALT 15 13 14   ALKPHOS 57 59 67  BILITOT 1.4* 1.8* 1.8*  PROT 4.5* 5.0* 5.0*  ALBUMIN 2.5* 2.6* 2.6*   Recent Labs    03/27/20 0401 03/28/20 0740 03/29/20 0711  WBC 8.3 8.7 9.1  NEUTROABS 5.3 6.0 6.6  HGB 7.0* 9.2* 9.4*  HCT 22.6* 28.9* 29.6*  MCV 97.8 95.1 95.5  PLT 150 174 201   Lab Results  Component Value Date   TSH 1.998 03/23/2020   No results found for: HGBA1C Lab Results  Component Value Date   CHOL 194 08/27/2019   HDL 87 08/27/2019   LDLCALC 87 08/27/2019   LDLDIRECT 97.7 06/22/2013   TRIG 104 08/27/2019   CHOLHDL 2.2 08/27/2019    Significant Diagnostic Results in last 30 days:  DG Chest 1 View  Result  Date: 03/23/2020 CLINICAL DATA:  [place] this morning. EXAM: CHEST  1 VIEW COMPARISON:  04/17/2015 FINDINGS: Interval enlargement of the cardiac silhouette with increased prominence of the pulmonary vasculature and interstitial markings. Diffuse osteopenia is again demonstrated as well as mild moderate thoracolumbar scoliosis and degenerative changes. IMPRESSION: Interval cardiomegaly and mild changes of congestive heart failure. Electronically Signed   By: Claudie Revering M.D.   On: 03/23/2020 12:29   DG C-Arm 1-60 Min  Result Date: 03/23/2020 CLINICAL DATA:  Left IM nail placement. EXAM: DG C-ARM 1-60 MIN; LEFT FEMUR 2 VIEWS FLUOROSCOPY TIME:  Fluoroscopy Time:  46 seconds COMPARISON:  March 23, 2020 FINDINGS: Intraoperative fluoroscopic images from IM nail and screw fixation of left sub trochanteric femoral fracture fixation demonstrate placement of orthopedic hardware with normal alignment and near anatomic osseous alignment. IMPRESSION: Intraoperative fluoroscopic images from IM nail and screw fixation of left sub trochanteric femoral fracture fixation. Electronically Signed   By: Fidela Salisbury M.D.   On: 03/23/2020 19:05   ECHOCARDIOGRAM COMPLETE  Result Date: 03/24/2020    ECHOCARDIOGRAM REPORT  Patient Name:   MIKAIA JANVIER Date of Exam: 03/24/2020 Medical Rec #:  [id number]     Height:       60.0 in Accession #:    [id number]    Weight:       131.0 lb Date of Birth:  [birth year]      BSA:          1.559 m Patient Age:    89 years      BP:           105/56 mmHg Patient Gender: F             HR:           59 bpm. Exam Location:  Inpatient Procedure: 2D Echo, Color Doppler, Cardiac Doppler and 3D Echo Indications:    R01.1 Murmur  History:        Patient has prior history of Echocardiogram examinations, most                 recent 03/16/2017. Risk Factors:Hypertension and Dyslipidemia.  Sonographer:    Raquel Sarna Senior RDCS Referring Phys: [place] IMPRESSIONS  1. Left ventricular ejection fraction, by  estimation, is 55 to 60%. The left ventricle has normal function. The left ventricle has no regional wall motion abnormalities. Left ventricular diastolic function could not be evaluated.  2. Right ventricular systolic function is normal. The right ventricular size is normal. There is mildly elevated pulmonary artery systolic pressure.  3. Tracings suggest atrial flutter with variable conduction, recommend clinical correlation.. Left atrial size was severely dilated.  4. Right atrial size was moderately dilated.  5. The mitral valve is normal in structure. Mild mitral valve regurgitation. No evidence of mitral stenosis.  6. Tricuspid valve regurgitation is mild to moderate.  7. The aortic valve is tricuspid. Aortic valve regurgitation is mild. Mild aortic valve sclerosis is present, with no evidence of aortic valve stenosis.  8. Aortic dilatation noted. There is moderate dilatation of the ascending aorta measuring 45 mm.  9. The inferior vena cava is dilated in size with >50% respiratory variability, suggesting right atrial pressure of 8 mmHg. FINDINGS  Left Ventricle: Left ventricular ejection fraction, by estimation, is 55 to 60%. The left ventricle has normal function. The left ventricle has no regional wall motion abnormalities. The left ventricular internal cavity size was normal in size. There is  no left ventricular hypertrophy. Left ventricular diastolic function could not be evaluated due to atrial fibrillation. Left ventricular diastolic function could not be evaluated. Right Ventricle: The right ventricular size is normal. No increase in right ventricular wall thickness. Right ventricular systolic function is normal. There is mildly elevated pulmonary artery systolic pressure. The tricuspid regurgitant velocity is 2.77  m/s, and with an assumed right atrial pressure of 8 mmHg, the estimated right ventricular systolic pressure is 68.3 mmHg. Left Atrium: Tracings suggest atrial flutter with variable  conduction, recommend clinical correlation. Left atrial size was severely dilated. Right Atrium: Right atrial size was moderately dilated. Pericardium: There is no evidence of pericardial effusion. Mitral Valve: The mitral valve is normal in structure. There is mild thickening of the mitral valve leaflet(s). There is mild calcification of the mitral valve leaflet(s). Mild mitral annular calcification. Mild mitral valve regurgitation. No evidence of  mitral valve stenosis. Tricuspid Valve: The tricuspid valve is normal in structure. Tricuspid valve regurgitation is mild to moderate. No evidence of tricuspid stenosis. Aortic Valve: The aortic valve is tricuspid. Aortic valve regurgitation is mild.  Aortic regurgitation PHT measures 989 msec. Mild aortic valve sclerosis is present, with no evidence of aortic valve stenosis. There is mild calcification of the aortic valve. Aortic valve mean gradient measures 9.0 mmHg. Aortic valve peak gradient measures 19.2 mmHg. Aortic valve area, by VTI measures 1.73 cm. Pulmonic Valve: The pulmonic valve was not well visualized. Pulmonic valve regurgitation is not visualized. Aorta: Aortic dilatation noted. There is moderate dilatation of the ascending aorta measuring 45 mm. Venous: The inferior vena cava is dilated in size with greater than 50% respiratory variability, suggesting right atrial pressure of 8 mmHg. IAS/Shunts: The atrial septum is grossly normal.  LEFT VENTRICLE PLAX 2D LVIDd:         3.50 cm  Diastology LVIDs:         2.70 cm  LV e' lateral:   6.64 cm/s LV PW:         0.90 cm  LV E/e' lateral: 15.5 LV IVS:        1.10 cm  LV e' medial:    6.85 cm/s LVOT diam:     2.00 cm  LV E/e' medial:  15.0 LV SV:         67 LV SV Index:   43 LVOT Area:     3.14 cm  RIGHT VENTRICLE RV S prime:     14.40 cm/s TAPSE (M-mode): 1.8 cm LEFT ATRIUM             Index       RIGHT ATRIUM           Index LA diam:        3.70 cm 2.37 cm/m  RA Area:     23.90 cm LA Vol (A2C):   92.8 ml  59.52 ml/m RA Volume:   70.00 ml  44.90 ml/m LA Vol (A4C):   85.9 ml 55.09 ml/m LA Biplane Vol: 90.2 ml 57.85 ml/m  AORTIC VALVE AV Area (Vmax):    1.43 cm AV Area (Vmean):   1.63 cm AV Area (VTI):     1.73 cm AV Vmax:           219.00 cm/s AV Vmean:          139.000 cm/s AV VTI:            0.386 m AV Peak Grad:      19.2 mmHg AV Mean Grad:      9.0 mmHg LVOT Vmax:         99.90 cm/s LVOT Vmean:        72.100 cm/s LVOT VTI:          0.212 m LVOT/AV VTI ratio: 0.55 AI PHT:            989 msec  AORTA Ao Root diam: 3.50 cm Ao Asc diam:  4.45 cm MITRAL VALVE                TRICUSPID VALVE MV Area (PHT): 2.93 cm     TR Peak grad:   30.7 mmHg MV Decel Time: 259 msec     TR Vmax:        277.00 cm/s MV E velocity: 103.00 cm/s MV A velocity: 25.30 cm/s   SHUNTS MV E/A ratio:  4.07         Systemic VTI:  0.21 m                             Systemic  Diam: 2.00 cm Buford Dresser MD Electronically signed by Buford Dresser MD Signature Date/Time: 03/24/2020/3:53:08 PM    Final    DG Hip Unilat With Pelvis 2-3 Views Left  Result Date: 03/23/2020 CLINICAL DATA:  Left hip pain following a fall this morning. EXAM: DG HIP (WITH OR WITHOUT PELVIS) 2-3V LEFT COMPARISON:  11/04/2017. FINDINGS: Acute, comminuted left intertrochanteric fracture with varus angulation. Interval healing of the previously demonstrated left superior and inferior pubic ramus fractures. Atheromatous arterial calcifications. Diffuse osteopenia. IMPRESSION: Acute, comminuted left intertrochanteric fracture with varus angulation. Electronically Signed   By: Claudie Revering M.D.   On: 03/23/2020 12:27   DG FEMUR MIN 2 VIEWS LEFT  Result Date: 03/23/2020 CLINICAL DATA:  Left IM nail placement. EXAM: DG C-ARM 1-60 MIN; LEFT FEMUR 2 VIEWS FLUOROSCOPY TIME:  Fluoroscopy Time:  46 seconds COMPARISON:  March 23, 2020 FINDINGS: Intraoperative fluoroscopic images from IM nail and screw fixation of left sub trochanteric femoral fracture fixation  demonstrate placement of orthopedic hardware with normal alignment and near anatomic osseous alignment. IMPRESSION: Intraoperative fluoroscopic images from IM nail and screw fixation of left sub trochanteric femoral fracture fixation. Electronically Signed   By: Fidela Salisbury M.D.   On: 03/23/2020 19:05    Assessment/Plan Hip fracture ([place]) hospitalization 03/23/20-03/29/20 for left intertrochanteric comminuted fracture, s/p IM by Dr. Doran Durand, takes ASA 70m bid for VTE risk reduction, prn Oxycodone for pain, tolerated.    Essential hypertension 138/79 today, continue Hydralazine 159mtid, update CMP/eGFR   Dementia (HCC) Continue Memantine, Mirtazapine, Quetiapine.   Blood loss anemia Post Op anemia, transfused 1 unit PRBC 03/27/20, takes Fe daily. Hgb 9.4 03/29/20, update CBC/diff   Slow transit constipation Last BM yesterday, continue Colace bid, prn Bisacodyl 1029maily, Senna II bid   Gastrointestinal stromal tumor (GIST) - esophagus Stable, continue Esomeprazole, prn Zofran.     Family/ staff Communication: plan of care reviewed with the patient and charge nurs.e   Labs/tests ordered:  CBC/diff, CMP/eGFR  Time spend 35 minutes.

## 2020-03-31 NOTE — Assessment & Plan Note (Signed)
Post Op anemia, transfused 1 unit PRBC 03/27/20, takes Fe daily. Hgb 9.4 03/29/20, update CBC/diff

## 2020-03-31 NOTE — Assessment & Plan Note (Signed)
Stable, continue Esomeprazole, prn Zofran.

## 2020-03-31 NOTE — Assessment & Plan Note (Signed)
Last BM yesterday, continue Colace bid, prn Bisacodyl 10mg  daily, Senna II bid

## 2020-03-31 NOTE — Assessment & Plan Note (Signed)
hospitalization 03/23/20-03/29/20 for left intertrochanteric comminuted fracture, s/p IM by Dr. Doran Durand, takes ASA 81mg  bid for VTE risk reduction, prn Oxycodone for pain, tolerated.

## 2020-04-01 ENCOUNTER — Encounter: Payer: Self-pay | Admitting: Nurse Practitioner

## 2020-04-03 ENCOUNTER — Inpatient Hospital Stay: Payer: Medicare Other | Admitting: Oncology

## 2020-04-03 ENCOUNTER — Non-Acute Institutional Stay (SKILLED_NURSING_FACILITY): Payer: Medicare Other | Admitting: Nurse Practitioner

## 2020-04-03 ENCOUNTER — Encounter: Payer: Self-pay | Admitting: Nurse Practitioner

## 2020-04-03 DIAGNOSIS — S72001D Fracture of unspecified part of neck of right femur, subsequent encounter for closed fracture with routine healing: Secondary | ICD-10-CM

## 2020-04-03 LAB — COMPREHENSIVE METABOLIC PANEL
Albumin: 3.4 — AB (ref 3.5–5.0)
Calcium: 9 (ref 8.7–10.7)
Globulin: 2.2

## 2020-04-03 LAB — CBC AND DIFFERENTIAL
HCT: 35 — AB (ref 36–46)
Hemoglobin: 11.4 — AB (ref 12.0–16.0)
Neutrophils Absolute: 7007
Platelets: 256 (ref 150–399)
WBC: 9.1

## 2020-04-03 LAB — BASIC METABOLIC PANEL
BUN: 21 (ref 4–21)
CO2: 23 — AB (ref 13–22)
Chloride: 109 — AB (ref 99–108)
Creatinine: 0.5 (ref 0.5–1.1)
Glucose: 96
Potassium: 3.3 — AB (ref 3.4–5.3)
Sodium: 145 (ref 137–147)

## 2020-04-03 LAB — HEPATIC FUNCTION PANEL
ALT: 9 (ref 7–35)
AST: 15 (ref 13–35)
Alkaline Phosphatase: 114 (ref 25–125)
Bilirubin, Total: 2.2

## 2020-04-03 LAB — CBC: RBC: 3.78 — AB (ref 3.87–5.11)

## 2020-04-03 NOTE — Progress Notes (Signed)
This encounter was created in error - please disregard.

## 2020-04-03 NOTE — Progress Notes (Signed)
Location:   [place] Room Number: 2 Place of Service:  SNF [phone]) Provider:  Sherrie Mustache, NP  Virgie Dad, MD  Patient Care Team: Virgie Dad, MD as PCP - General (Internal Medicine) Minus Breeding, MD as Referring Physician (Cardiology) Tanda Rockers, MD as Referring Physician (Pulmonary Disease) Gatha Mayer, MD as Referring Physician (Gastroenterology) Gaynelle Arabian, MD as Referring Physician (Orthopedic Surgery) Melida Quitter, MD as Referring Physician Jackolyn Confer, MD (General Surgery)  Extended Emergency Contact Information Primary Emergency Contact: [facility] Address: [place], [place] [address] [name] of [place] Phone: [phone] Mobile Phone: [phone] Relation: Spouse Secondary Emergency Contact: Marcina Millard of [place] Phone: [phone] Mobile Phone: [phone] Relation: Son  Code Status:  DNR Goals of care: Advanced Directive information Advanced Directives 04/03/2020  Does Patient Have a Medical Advance Directive? Yes  Type of Paramedic of [place];Living will;Out of facility DNR (pink MOST or yellow form)  Does patient want to make changes to medical advance directive? No - Patient declined  Copy of [place] in Chart? Yes - validated most recent copy scanned in chart (See row information)  Would patient like information on creating a medical advance directive? -  Pre-existing out of facility DNR order (yellow form or pink MOST form) Yellow form placed in chart (order not valid for inpatient use)     Chief Complaint  Patient presents with   Acute Visit    Pain management     HPI:  Pt is a 86 y.o. female seen today for an acute visit for refill on her oxy IR. Pt with left intertrochanteric comminuted fracture, s/p IM by Dr. Doran Durand on 03/23/2020. She has been on Oxy IR 5 mg q  6hours for 5 days since  back in rehab. Her Rx has expired and nursing reports that she is still in pain and request an extended supple. Pt with advance dementia and unable to provide history regarding her pain however her sister was at the bedside and reported that her and family did not want her getting an extended course of Oxy IR unless it was absolutely necessary due to severe pain. She has been moving around more and participating in therapy.    Past Medical History:  Diagnosis Date   Anxiety    Benign fundic gland polyps of stomach    Benign gastrointestinal stromal tumor (GIST)    Bronchitis    Cancer ([place])    recent skin cancer left leg  excised about 8 days ago-remains with dressing intact.    Chronic cystitis    COLONIC POLYPS, HX OF    Complication of anesthesia    Diverticulosis    DYSLIPIDEMIA    Gastritis    GERD (gastroesophageal reflux disease)    Hemorrhoids    Hiatal hernia    HYPERTENSION    Macular degeneration    optho q24mo - [facility]   Meningioma ([place])    MITRAL VALVE PROLAPSE    NEPHROLITHIASIS    OSTEOARTHRITIS    OSTEOPOROSIS    PONV (postoperative nausea and vomiting)    Thoracic aortic aneurysm ([place])    4.1 cm 2015   Past Surgical History:  Procedure Laterality Date   ABDOMINAL HYSTERECTOMY     APPENDECTOMY     BREAST SURGERY     CARDIAC CATHETERIZATION  1995   no PCI, performed in [place]  Cataract surgery  2000   CHOLECYSTECTOMY     COLONOSCOPY     ESOPHAGOGASTRODUODENOSCOPY     EUS N/A 05/16/2014   Procedure: UPPER ENDOSCOPIC ULTRASOUND (EUS) LINEAR;  Surgeon: Milus Banister, MD;  Location: [facility];  Service: Endoscopy;  Laterality: N/A;   INSERTION OF MESH  07/04/2012   Procedure: INSERTION OF MESH;  Surgeon: Odis Hollingshead, MD;  Location: [facility];  Service: General;  Laterality: N/A;   INTRAMEDULLARY (IM) NAIL INTERTROCHANTERIC Left 03/23/2020   Procedure: INTRAMEDULLARY (IM) NAIL INTERTROCHANTRIC HIP;  Surgeon: Wylene Simmer,  MD;  Location: [facility];  Service: Orthopedics;  Laterality: Left;   KNEE SURGERY     LEG SKIN LESION  BIOPSY / EXCISION Left    8 days ago pending pathology, remains with dressing.   SURGERY OF LIP  in 2002   [place]  07/04/2012   Procedure: HERNIA REPAIR VENTRAL ADULT;  Surgeon: Odis Hollingshead, MD;  Location: [facility];  Service: General;  Laterality: N/A;    Allergies  Allergen Reactions   Pentothal [Thiopental] Nausea And Vomiting   Codeine    Levofloxacin    Oxycodone-Acetaminophen    Sulfonamide Derivatives     REACTION: GI upset/nausea/vomiting   Tape Rash    Per patient adhesive tape    Allergies as of 04/03/2020      Reactions   Pentothal [thiopental] Nausea And Vomiting   Codeine    Levofloxacin    Oxycodone-acetaminophen    Sulfonamide Derivatives    REACTION: GI upset/nausea/vomiting   Tape Rash   Per patient adhesive tape      Medication List       Accurate as of April 03, 2020  3:17 PM. If you have any questions, ask your nurse or doctor.        ascorbic acid 500 MG tablet Commonly known as: VITAMIN C Take 1 tablet (500 mg total) by mouth daily.   aspirin EC 81 MG tablet Take 1 tablet (81 mg total) by mouth 2 (two) times daily.   bisacodyl 5 MG EC tablet Commonly known as: DULCOLAX Take 10 mg by mouth daily as needed for moderate constipation.   docusate sodium 100 MG capsule Commonly known as: Colace Take 1 capsule (100 mg total) by mouth 2 (two) times daily. While taking narcotic pain medicine.   esomeprazole 40 MG capsule Commonly known as: NEXIUM TAKE 1 CAPSULE BY MOUTH 30-60 MIN BEFORE YOUR FIRST AND LAST MEAL OF THE DAY What changed: See the new instructions.   ferrous sulfate 325 (65 FE) MG tablet Take 1 tablet (325 mg total) by mouth daily with breakfast.   hydrALAZINE 10 MG tablet Commonly known as: APRESOLINE Take 1 tablet (10 mg total) by mouth every 8 (eight) hours as needed (SBP more than  150).   melatonin 5 MG Tabs Take 5 mg by mouth at bedtime.   memantine 10 MG tablet Commonly known as: NAMENDA TAKE 1 TABLET BY MOUTH TWICE A DAY   mirtazapine 7.5 MG tablet Commonly known as: REMERON Take 7.5 mg by mouth at bedtime.   ondansetron 4 MG tablet Commonly known as: ZOFRAN Take 1 tablet (4 mg total) by mouth every 6 (six) hours as needed for nausea.   oxyCODONE 5 MG immediate release tablet Commonly known as: Oxy IR/ROXICODONE Take 5 mg by mouth every 4 (four) hours as needed for severe pain.   QUEtiapine 25 MG tablet Commonly known as: SEROQUEL Take 12.5  mg by mouth at bedtime. 12.5mg , oral, Once A Day   senna 8.6 MG Tabs tablet Commonly known as: SENOKOT Take 2 tablets (17.2 mg total) by mouth 2 (two) times daily.   simvastatin 10 MG tablet Commonly known as: ZOCOR Take 5 mg by mouth daily.       Review of Systems  Unable to perform ROS: Dementia    Immunization History  Administered Date(s) Administered   Influenza Split 06/07/2011, 06/13/2012, 06/15/2013   Influenza Whole 06/06/2009, 05/18/2010   Influenza, High Dose Seasonal PF 06/15/2013, 06/23/2017, 06/22/2018, 04/30/2019   Influenza-Unspecified 06/07/2015, 05/28/2016   Moderna SARS-COVID-2 Vaccination 09/10/2019, 10/08/2019   Pneumococcal Conjugate-13 07/12/2014   Pneumococcal Polysaccharide-23 09/07/2007   Td 09/07/2007   Tdap 06/21/2016   Pertinent  Health Maintenance Due  Topic Date Due   INFLUENZA VACCINE  04/06/2020   DEXA SCAN  Completed   PNA vac Low Risk Adult  Completed   Fall Risk  10/02/2019 09/26/2019 06/20/2019 02/28/2019 02/14/2019  Falls in the past year? 0 0 1 1 1   Number falls in past yr: 0 0 0 0 0  Injury with Fall? - - 1 1 1   Comment - - - - -  Risk Factor Category  - - - - -  Risk for fall due to : - - - - -  Follow up - - - - -   Functional Status Survey:    Vitals:   04/03/20 1502  BP: 112/67  Resp: 14  Temp: (!) 97.3 F (36.3 C)  SpO2: 95%    Weight: 122 lb 3.2 oz (55.4 kg)  Height: 5' (1.524 m)   Body mass index is 23.87 kg/m. Physical Exam Constitutional:      General: She is not in acute distress. Cardiovascular:     Rate and Rhythm: Normal rate and regular rhythm.  Pulmonary:     Effort: Pulmonary effort is normal.     Breath sounds: Normal breath sounds.  Skin:    General: Skin is warm and dry.     Comments: Dressing to left hip with bruising noted.   Neurological:     Mental Status: She is alert.     Labs reviewed: Recent Labs    03/27/20 0401 03/28/20 0740 03/29/20 0711  NA 142 141 141  K 3.9 3.7 3.7  CL 107 107 107  CO2 29 24 25   GLUCOSE 96 106* 107*  BUN 15 18 17   CREATININE 0.54 0.53 0.56  CALCIUM 8.3* 8.6* 8.6*  MG 1.8 2.0 1.9  PHOS 3.1 3.1 3.2   Recent Labs    03/27/20 0401 03/28/20 0740 03/29/20 0711  AST 16 16 17   ALT 15 13 14   ALKPHOS 57 59 67  BILITOT 1.4* 1.8* 1.8*  PROT 4.5* 5.0* 5.0*  ALBUMIN 2.5* 2.6* 2.6*   Recent Labs    03/27/20 0401 03/28/20 0740 03/29/20 0711  WBC 8.3 8.7 9.1  NEUTROABS 5.3 6.0 6.6  HGB 7.0* 9.2* 9.4*  HCT 22.6* 28.9* 29.6*  MCV 97.8 95.1 95.5  PLT 150 174 201   Lab Results  Component Value Date   TSH 1.998 03/23/2020   No results found for: HGBA1C Lab Results  Component Value Date   CHOL 194 08/27/2019   HDL 87 08/27/2019   LDLCALC 87 08/27/2019   LDLDIRECT 97.7 06/22/2013   TRIG 104 08/27/2019   CHOLHDL 2.2 08/27/2019    Significant Diagnostic Results in last 30 days:  DG Chest 1 View  Result Date: 03/23/2020  CLINICAL DATA:  [place] this morning. EXAM: CHEST  1 VIEW COMPARISON:  04/17/2015 FINDINGS: Interval enlargement of the cardiac silhouette with increased prominence of the pulmonary vasculature and interstitial markings. Diffuse osteopenia is again demonstrated as well as mild moderate thoracolumbar scoliosis and degenerative changes. IMPRESSION: Interval cardiomegaly and mild changes of congestive heart failure. Electronically  Signed   By: Claudie Revering M.D.   On: 03/23/2020 12:29   DG C-Arm 1-60 Min  Result Date: 03/23/2020 CLINICAL DATA:  Left IM nail placement. EXAM: DG C-ARM 1-60 MIN; LEFT FEMUR 2 VIEWS FLUOROSCOPY TIME:  Fluoroscopy Time:  46 seconds COMPARISON:  March 23, 2020 FINDINGS: Intraoperative fluoroscopic images from IM nail and screw fixation of left sub trochanteric femoral fracture fixation demonstrate placement of orthopedic hardware with normal alignment and near anatomic osseous alignment. IMPRESSION: Intraoperative fluoroscopic images from IM nail and screw fixation of left sub trochanteric femoral fracture fixation. Electronically Signed   By: Fidela Salisbury M.D.   On: 03/23/2020 19:05   ECHOCARDIOGRAM COMPLETE  Result Date: 03/24/2020    ECHOCARDIOGRAM REPORT   Patient Name:   Chelsea Jordan Date of Exam: 03/24/2020 Medical Rec #:  [id number]     Height:       60.0 in Accession #:    [id number]    Weight:       131.0 lb Date of Birth:  07-20-1927      BSA:          1.559 m Patient Age:    86 years      BP:           105/56 mmHg Patient Gender: F             HR:           59 bpm. Exam Location:  Inpatient Procedure: 2D Echo, Color Doppler, Cardiac Doppler and 3D Echo Indications:    R01.1 Murmur  History:        Patient has prior history of Echocardiogram examinations, most                 recent 03/16/2017. Risk Factors:Hypertension and Dyslipidemia.  Sonographer:    Raquel Sarna Senior RDCS Referring Phys: [place] IMPRESSIONS  1. Left ventricular ejection fraction, by estimation, is 55 to 60%. The left ventricle has normal function. The left ventricle has no regional wall motion abnormalities. Left ventricular diastolic function could not be evaluated.  2. Right ventricular systolic function is normal. The right ventricular size is normal. There is mildly elevated pulmonary artery systolic pressure.  3. Tracings suggest atrial flutter with variable conduction, recommend clinical correlation.. Left atrial  size was severely dilated.  4. Right atrial size was moderately dilated.  5. The mitral valve is normal in structure. Mild mitral valve regurgitation. No evidence of mitral stenosis.  6. Tricuspid valve regurgitation is mild to moderate.  7. The aortic valve is tricuspid. Aortic valve regurgitation is mild. Mild aortic valve sclerosis is present, with no evidence of aortic valve stenosis.  8. Aortic dilatation noted. There is moderate dilatation of the ascending aorta measuring 45 mm.  9. The inferior vena cava is dilated in size with >50% respiratory variability, suggesting right atrial pressure of 8 mmHg. FINDINGS  Left Ventricle: Left ventricular ejection fraction, by estimation, is 55 to 60%. The left ventricle has normal function. The left ventricle has no regional wall motion abnormalities. The left ventricular internal cavity size was normal in size. There is  no left ventricular hypertrophy.  Left ventricular diastolic function could not be evaluated due to atrial fibrillation. Left ventricular diastolic function could not be evaluated. Right Ventricle: The right ventricular size is normal. No increase in right ventricular wall thickness. Right ventricular systolic function is normal. There is mildly elevated pulmonary artery systolic pressure. The tricuspid regurgitant velocity is 2.77  m/s, and with an assumed right atrial pressure of 8 mmHg, the estimated right ventricular systolic pressure is 88.8 mmHg. Left Atrium: Tracings suggest atrial flutter with variable conduction, recommend clinical correlation. Left atrial size was severely dilated. Right Atrium: Right atrial size was moderately dilated. Pericardium: There is no evidence of pericardial effusion. Mitral Valve: The mitral valve is normal in structure. There is mild thickening of the mitral valve leaflet(s). There is mild calcification of the mitral valve leaflet(s). Mild mitral annular calcification. Mild mitral valve regurgitation. No evidence of   mitral valve stenosis. Tricuspid Valve: The tricuspid valve is normal in structure. Tricuspid valve regurgitation is mild to moderate. No evidence of tricuspid stenosis. Aortic Valve: The aortic valve is tricuspid. Aortic valve regurgitation is mild. Aortic regurgitation PHT measures 989 msec. Mild aortic valve sclerosis is present, with no evidence of aortic valve stenosis. There is mild calcification of the aortic valve. Aortic valve mean gradient measures 9.0 mmHg. Aortic valve peak gradient measures 19.2 mmHg. Aortic valve area, by VTI measures 1.73 cm. Pulmonic Valve: The pulmonic valve was not well visualized. Pulmonic valve regurgitation is not visualized. Aorta: Aortic dilatation noted. There is moderate dilatation of the ascending aorta measuring 45 mm. Venous: The inferior vena cava is dilated in size with greater than 50% respiratory variability, suggesting right atrial pressure of 8 mmHg. IAS/Shunts: The atrial septum is grossly normal.  LEFT VENTRICLE PLAX 2D LVIDd:         3.50 cm  Diastology LVIDs:         2.70 cm  LV e' lateral:   6.64 cm/s LV PW:         0.90 cm  LV E/e' lateral: 15.5 LV IVS:        1.10 cm  LV e' medial:    6.85 cm/s LVOT diam:     2.00 cm  LV E/e' medial:  15.0 LV SV:         67 LV SV Index:   43 LVOT Area:     3.14 cm  RIGHT VENTRICLE RV S prime:     14.40 cm/s TAPSE (M-mode): 1.8 cm LEFT ATRIUM             Index       RIGHT ATRIUM           Index LA diam:        3.70 cm 2.37 cm/m  RA Area:     23.90 cm LA Vol (A2C):   92.8 ml 59.52 ml/m RA Volume:   70.00 ml  44.90 ml/m LA Vol (A4C):   85.9 ml 55.09 ml/m LA Biplane Vol: 90.2 ml 57.85 ml/m  AORTIC VALVE AV Area (Vmax):    1.43 cm AV Area (Vmean):   1.63 cm AV Area (VTI):     1.73 cm AV Vmax:           219.00 cm/s AV Vmean:          139.000 cm/s AV VTI:            0.386 m AV Peak Grad:      19.2 mmHg AV Mean Grad:  9.0 mmHg LVOT Vmax:         99.90 cm/s LVOT Vmean:        72.100 cm/s LVOT VTI:          0.212 m  LVOT/AV VTI ratio: 0.55 AI PHT:            989 msec  AORTA Ao Root diam: 3.50 cm Ao Asc diam:  4.45 cm MITRAL VALVE                TRICUSPID VALVE MV Area (PHT): 2.93 cm     TR Peak grad:   30.7 mmHg MV Decel Time: 259 msec     TR Vmax:        277.00 cm/s MV E velocity: 103.00 cm/s MV A velocity: 25.30 cm/s   SHUNTS MV E/A ratio:  4.07         Systemic VTI:  0.21 m                             Systemic Diam: 2.00 cm Buford Dresser MD Electronically signed by Buford Dresser MD Signature Date/Time: 03/24/2020/3:53:08 PM    Final    DG Hip Unilat With Pelvis 2-3 Views Left  Result Date: 03/23/2020 CLINICAL DATA:  Left hip pain following a fall this morning. EXAM: DG HIP (WITH OR WITHOUT PELVIS) 2-3V LEFT COMPARISON:  11/04/2017. FINDINGS: Acute, comminuted left intertrochanteric fracture with varus angulation. Interval healing of the previously demonstrated left superior and inferior pubic ramus fractures. Atheromatous arterial calcifications. Diffuse osteopenia. IMPRESSION: Acute, comminuted left intertrochanteric fracture with varus angulation. Electronically Signed   By: Claudie Revering M.D.   On: 03/23/2020 12:27   DG FEMUR MIN 2 VIEWS LEFT  Result Date: 03/23/2020 CLINICAL DATA:  Left IM nail placement. EXAM: DG C-ARM 1-60 MIN; LEFT FEMUR 2 VIEWS FLUOROSCOPY TIME:  Fluoroscopy Time:  46 seconds COMPARISON:  March 23, 2020 FINDINGS: Intraoperative fluoroscopic images from IM nail and screw fixation of left sub trochanteric femoral fracture fixation demonstrate placement of orthopedic hardware with normal alignment and near anatomic osseous alignment. IMPRESSION: Intraoperative fluoroscopic images from IM nail and screw fixation of left sub trochanteric femoral fracture fixation. Electronically Signed   By: Fidela Salisbury M.D.   On: 03/23/2020 19:05    Assessment/Plan 1. Closed fracture of right hip with routine healing, subsequent encounter -to use tylenol 500 mg 2 tablets every 8 hours  as needed for pain. Ideally getting her off oxy IR is goal. Hopefully she does not have excessive pain once stopped,  nursing will monitor closely and notify.   Carlos American. [place], [facility] [phone]

## 2020-04-10 ENCOUNTER — Non-Acute Institutional Stay (SKILLED_NURSING_FACILITY): Payer: Medicare Other | Admitting: Internal Medicine

## 2020-04-10 ENCOUNTER — Encounter: Payer: Self-pay | Admitting: Internal Medicine

## 2020-04-10 DIAGNOSIS — R630 Anorexia: Secondary | ICD-10-CM | POA: Diagnosis not present

## 2020-04-10 DIAGNOSIS — D500 Iron deficiency anemia secondary to blood loss (chronic): Secondary | ICD-10-CM | POA: Diagnosis not present

## 2020-04-10 DIAGNOSIS — F0391 Unspecified dementia with behavioral disturbance: Secondary | ICD-10-CM | POA: Diagnosis not present

## 2020-04-10 DIAGNOSIS — I10 Essential (primary) hypertension: Secondary | ICD-10-CM | POA: Diagnosis not present

## 2020-04-10 DIAGNOSIS — E785 Hyperlipidemia, unspecified: Secondary | ICD-10-CM

## 2020-04-10 DIAGNOSIS — R634 Abnormal weight loss: Secondary | ICD-10-CM

## 2020-04-10 NOTE — Progress Notes (Signed)
Provider:  Veleta Miners MD Location:    [place] Room Number: 2 Place of Service:  SNF ([phone])  PCP: Virgie Dad, MD Patient Care Team: Virgie Dad, MD as PCP - General (Internal Medicine) Minus Breeding, MD as Referring Physician (Cardiology) Tanda Rockers, MD as Referring Physician (Pulmonary Disease) Gatha Mayer, MD as Referring Physician (Gastroenterology) Gaynelle Arabian, MD as Referring Physician (Orthopedic Surgery) Melida Quitter, MD as Referring Physician Jackolyn Confer, MD (General Surgery)  Extended Emergency Contact Information Primary Emergency Contact: [facility] Address: [place], [place] [address] [name] of [place] Phone: [phone] Mobile Phone: [phone] Relation: Spouse Secondary Emergency Contact: Marcina Millard of [place] Phone: [phone] Mobile Phone: [phone] Relation: Son  Code Status: DNR Goals of Care: Advanced Directive information Advanced Directives 04/10/2020  Does Patient Have a Medical Advance Directive? Yes  Type of Paramedic of [place];Living will;Out of facility DNR (pink MOST or yellow form)  Does patient want to make changes to medical advance directive? No - Patient declined  Copy of [place] in Chart? Yes - validated most recent copy scanned in chart (See row information)  Would patient like information on creating a medical advance directive? -  Pre-existing out of facility DNR order (yellow form or pink MOST form) Yellow form placed in chart (order not valid for inpatient use)      Chief Complaint  Patient presents with  . New Admit To SNF    Admission    HPI: Patient is a 86 y.o. female seen today for admission to SNF for Therapy  Patient has h/o history of recurrent UTI, thoracic aneurysm 4.3 cm, history of gist tumor, moderate dementia with weight loss and unsteady  gait.  Admitted in the Hospital from 7/18-7/24 for Left Intertrochanteric Fracture Underwent Surgery With IM nailing on 7/18  Post Op needed Blood Transfusion Now in SNF for therapy Per Therapist doing well but staying tired. Husband in room. Says not eating well. Patient said she has no Appetite. Has not had any fever or Chills. No Coughing or dysuria Just feels Weak and Tired. Sleeping  Past Medical History:  Diagnosis Date  . Anxiety   . Benign fundic gland polyps of stomach   . Benign gastrointestinal stromal tumor (GIST)   . Bronchitis   . Cancer ([facility])    recent skin cancer left leg  excised about 8 days ago-remains with dressing intact.   . Chronic cystitis   . COLONIC POLYPS, HX OF   . Complication of anesthesia   . Diverticulosis   . DYSLIPIDEMIA   . Gastritis   . GERD (gastroesophageal reflux disease)   . Hemorrhoids   . Hiatal hernia   . HYPERTENSION   . Macular degeneration    optho q38mo - [facility]  . Meningioma ([place])   . MITRAL VALVE PROLAPSE   . NEPHROLITHIASIS   . OSTEOARTHRITIS   . OSTEOPOROSIS   . PONV (postoperative nausea and vomiting)   . Thoracic aortic aneurysm ([facility])    4.1 cm 2015   Past Surgical History:  Procedure Laterality Date  . ABDOMINAL HYSTERECTOMY    . APPENDECTOMY    . BREAST SURGERY    . [place]   no PCI, performed in [place]  . Cataract surgery  2000  . CHOLECYSTECTOMY    . COLONOSCOPY    . ESOPHAGOGASTRODUODENOSCOPY    .  EUS N/A 05/16/2014   Procedure: UPPER ENDOSCOPIC ULTRASOUND (EUS) LINEAR;  Surgeon: Milus Banister, MD;  Location: [facility];  Service: Endoscopy;  Laterality: N/A;  . INSERTION OF MESH  07/04/2012   Procedure: INSERTION OF MESH;  Surgeon: Odis Hollingshead, MD;  Location: [facility];  Service: General;  Laterality: N/A;  . INTRAMEDULLARY (IM) NAIL INTERTROCHANTERIC Left 03/23/2020   Procedure: INTRAMEDULLARY (IM) NAIL INTERTROCHANTRIC HIP;  Surgeon: Wylene Simmer, MD;  Location: [facility];   Service: Orthopedics;  Laterality: Left;  . KNEE SURGERY    . LEG SKIN LESION  BIOPSY / EXCISION Left    8 days ago pending pathology, remains with dressing.  . SURGERY OF LIP  in 2002  . TONSILLECTOMY    . VENTRAL HERNIA REPAIR  07/04/2012   Procedure: HERNIA REPAIR VENTRAL ADULT;  Surgeon: Odis Hollingshead, MD;  Location: [facility];  Service: General;  Laterality: N/A;    reports that she has never smoked. She has never used smokeless tobacco. She reports current alcohol use of about 6.0 standard drinks of alcohol per week. She reports that she does not use drugs. Social History   Socioeconomic History  . Marital status: Married    Spouse name: Not on file  . Number of children: 2  . Years of education: Not on file  . Highest education level: Not on file  Occupational History  . Occupation: retired  Tobacco Use  . Smoking status: Never Smoker  . Smokeless tobacco: Never Used  . Tobacco comment: Married, lives with spouse, Enjoys golf. Retired  Media planner  . Vaping Use: Never used  Substance and Sexual Activity  . Alcohol use: Yes    Alcohol/week: 6.0 standard drinks    Types: 6 Standard drinks or equivalent per week    Comment: Wine -nightly  . Drug use: No  . Sexual activity: Not Currently  Other Topics Concern  . Not on file  Social History Narrative   Married, lives with spouse - enjoys golf   Lives at [place]?       Do you drink/eat things with caffeine? Yes 3 cups of coffee per day      Marital status?         Married            What year were you married? 1953       Do you live in a house, apartment, assisted living, condo, trailer, etc.? [facility] [place]      Is it one or more stories? 3       How many persons live in your home? 2       Do you have any pets in your home? (please list) no      Highest level of education completed? High school       Current or past profession: draftman [employer]      Do you exercise?                   yes                    Type & how often? Walking daily       Advanced Directives      Do you have a living will? yes      Do you have a DNR form?  If not, do you want to discuss one? yes      Do you have signed POA/HPOA for forms? Yes       Functional Status      Do you have difficulty bathing or dressing yourself? No       Do you have difficulty preparing food or eating?  No       Do you have difficulty managing your medications? No       Do you have difficulty managing your finances? No       Do you have difficulty affording your medications? No       Social Determinants of Health   Financial Resource Strain:   . Difficulty of Paying Living Expenses:   Food Insecurity:   . Worried About Charity fundraiser in the Last Year:   . Arboriculturist in the Last Year:   Transportation Needs:   . Film/video editor (Medical):   Marland Kitchen Lack of Transportation (Non-Medical):   Physical Activity:   . Days of Exercise per Week:   . Minutes of Exercise per Session:   Stress:   . Feeling of Stress :   Social Connections:   . Frequency of Communication with Friends and Family:   . Frequency of Social Gatherings with Friends and Family:   . Attends Religious Services:   . Active Member of Clubs or Organizations:   . Attends Archivist Meetings:   Marland Kitchen Marital Status:   Intimate Partner Violence:   . Fear of Current or Ex-Partner:   . Emotionally Abused:   Marland Kitchen Physically Abused:   . Sexually Abused:     Functional Status Survey:    Family History  Problem Relation Age of Onset  . Heart disease Other   . Heart disease Mother   . Heart disease Father   . Emphysema Father   . Heart attack Father   . Breast cancer Sister   . Lung cancer Sister   . Prostate cancer Son   . Prostate cancer Brother     Health Maintenance  Topic Date Due  . INFLUENZA VACCINE  04/06/2020  . TETANUS/TDAP  06/21/2026  . DEXA SCAN  Completed  .  COVID-19 Vaccine  Completed  . PNA vac Low Risk Adult  Completed    Allergies  Allergen Reactions  . Pentothal [Thiopental] Nausea And Vomiting  . Codeine   . Levofloxacin   . Oxycodone-Acetaminophen   . Sulfonamide Derivatives     REACTION: GI upset/nausea/vomiting  . Tape Rash    Per patient adhesive tape    Allergies as of 04/10/2020      Reactions   Pentothal [thiopental] Nausea And Vomiting   Codeine    Levofloxacin    Oxycodone-acetaminophen    Sulfonamide Derivatives    REACTION: GI upset/nausea/vomiting   Tape Rash   Per patient adhesive tape      Medication List       Accurate as of April 10, 2020  9:44 AM. If you have any questions, ask your nurse or doctor.        acetaminophen 500 MG tablet Commonly known as: TYLENOL Take 1,000 mg by mouth every 8 (eight) hours as needed.   ascorbic acid 500 MG tablet Commonly known as: VITAMIN C Take 1 tablet (500 mg total) by mouth daily.   aspirin EC 81 MG tablet Take 1 tablet (81 mg total) by mouth 2 (two) times daily.   bisacodyl 5 MG EC tablet  Commonly known as: DULCOLAX Take 10 mg by mouth daily as needed for moderate constipation.   docusate sodium 100 MG capsule Commonly known as: Colace Take 1 capsule (100 mg total) by mouth 2 (two) times daily. While taking narcotic pain medicine.   esomeprazole 40 MG capsule Commonly known as: NEXIUM Take 40 mg by mouth 2 (two) times daily before a meal. What changed: Another medication with the same name was removed. Continue taking this medication, and follow the directions you see here. Changed by: Virgie Dad, MD   ferrous sulfate 325 (65 FE) MG tablet Take 1 tablet (325 mg total) by mouth daily with breakfast.   hydrALAZINE 10 MG tablet Commonly known as: APRESOLINE Take 1 tablet (10 mg total) by mouth every 8 (eight) hours as needed (SBP more than 150).   melatonin 5 MG Tabs Take 5 mg by mouth at bedtime.   memantine 10 MG tablet Commonly known as:  NAMENDA Take 10 mg by mouth 2 (two) times daily. What changed: Another medication with the same name was removed. Continue taking this medication, and follow the directions you see here. Changed by: Virgie Dad, MD   mirtazapine 7.5 MG tablet Commonly known as: REMERON Take 7.5 mg by mouth at bedtime.   ondansetron 4 MG tablet Commonly known as: ZOFRAN Take 1 tablet (4 mg total) by mouth every 6 (six) hours as needed for nausea.   oxyCODONE 5 MG immediate release tablet Commonly known as: Oxy IR/ROXICODONE Take 5 mg by mouth every 4 (four) hours as needed for severe pain.   QUEtiapine 25 MG tablet Commonly known as: SEROQUEL Take 12.5 mg by mouth at bedtime. 12.5mg , oral, Once A Day   senna 8.6 MG Tabs tablet Commonly known as: SENOKOT Take 2 tablets (17.2 mg total) by mouth 2 (two) times daily.   simvastatin 10 MG tablet Commonly known as: ZOCOR Take 5 mg by mouth daily.       Review of Systems  Constitutional: Positive for activity change, appetite change and unexpected weight change.  HENT: Negative.   Respiratory: Negative.   Cardiovascular: Negative.   Gastrointestinal: Negative.   Genitourinary: Negative for dysuria.  Musculoskeletal: Positive for gait problem.  Skin: Negative.   Neurological: Positive for weakness.  Psychiatric/Behavioral: Positive for confusion.    Vitals:   04/10/20 0936  BP: (!) 160/89  Pulse: 61  Resp: 14  Temp: (!) 97.2 F (36.2 C)  SpO2: 94%  Weight: 113 lb 11.2 oz (51.6 kg)  Height: 5' (1.524 m)   Body mass index is 22.21 kg/m. Physical Exam Vitals reviewed.  Constitutional:      Comments: Looks frail  HENT:     Head: Normocephalic.     Nose: Nose normal.     Mouth/Throat:     Mouth: Mucous membranes are dry.     Pharynx: Oropharynx is clear.  Eyes:     Pupils: Pupils are equal, round, and reactive to light.  Cardiovascular:     Rate and Rhythm: Normal rate and regular rhythm.     Pulses: Normal pulses.   Pulmonary:     Effort: Pulmonary effort is normal.     Breath sounds: Normal breath sounds.  Abdominal:     General: Abdomen is flat.     Palpations: Abdomen is soft.     Comments: C/o Mild Tenderness   Musculoskeletal:        General: No swelling.     Cervical back: Neck supple.  Skin:  General: Skin is warm.  Neurological:     General: No focal deficit present.     Mental Status: She is alert.  Psychiatric:        Mood and Affect: Mood normal.        Thought Content: Thought content normal.     Labs reviewed: Basic Metabolic Panel: Recent Labs    03/27/20 0401 03/28/20 0740 03/29/20 0711  NA 142 141 141  K 3.9 3.7 3.7  CL 107 107 107  CO2 29 24 25   GLUCOSE 96 106* 107*  BUN 15 18 17   CREATININE 0.54 0.53 0.56  CALCIUM 8.3* 8.6* 8.6*  MG 1.8 2.0 1.9  PHOS 3.1 3.1 3.2   Liver Function Tests: Recent Labs    03/27/20 0401 03/28/20 0740 03/29/20 0711  AST 16 16 17   ALT 15 13 14   ALKPHOS 57 59 67  BILITOT 1.4* 1.8* 1.8*  PROT 4.5* 5.0* 5.0*  ALBUMIN 2.5* 2.6* 2.6*   No results for input(s): LIPASE, AMYLASE in the last 8760 hours. No results for input(s): AMMONIA in the last 8760 hours. CBC: Recent Labs    03/27/20 0401 03/28/20 0740 03/29/20 0711  WBC 8.3 8.7 9.1  NEUTROABS 5.3 6.0 6.6  HGB 7.0* 9.2* 9.4*  HCT 22.6* 28.9* 29.6*  MCV 97.8 95.1 95.5  PLT 150 174 201   Cardiac Enzymes: No results for input(s): CKTOTAL, CKMB, CKMBINDEX, TROPONINI in the last 8760 hours. BNP: Invalid input(s): POCBNP No results found for: HGBA1C Lab Results  Component Value Date   TSH 1.998 03/23/2020   Lab Results  Component Value Date   VITAMINB12 314 03/26/2020   Lab Results  Component Value Date   FOLATE 11.4 03/26/2020   Lab Results  Component Value Date   IRON 34 03/26/2020   TIBC 195 (L) 03/26/2020   FERRITIN 109 03/26/2020    Imaging and Procedures obtained prior to SNF admission: DG Chest 1 View  Result Date: 03/23/2020 CLINICAL DATA:   [place] this morning. EXAM: CHEST  1 VIEW COMPARISON:  04/17/2015 FINDINGS: Interval enlargement of the cardiac silhouette with increased prominence of the pulmonary vasculature and interstitial markings. Diffuse osteopenia is again demonstrated as well as mild moderate thoracolumbar scoliosis and degenerative changes. IMPRESSION: Interval cardiomegaly and mild changes of congestive heart failure. Electronically Signed   By: Claudie Revering M.D.   On: 03/23/2020 12:29   DG C-Arm 1-60 Min  Result Date: 03/23/2020 CLINICAL DATA:  Left IM nail placement. EXAM: DG C-ARM 1-60 MIN; LEFT FEMUR 2 VIEWS FLUOROSCOPY TIME:  Fluoroscopy Time:  46 seconds COMPARISON:  March 23, 2020 FINDINGS: Intraoperative fluoroscopic images from IM nail and screw fixation of left sub trochanteric femoral fracture fixation demonstrate placement of orthopedic hardware with normal alignment and near anatomic osseous alignment. IMPRESSION: Intraoperative fluoroscopic images from IM nail and screw fixation of left sub trochanteric femoral fracture fixation. Electronically Signed   By: Fidela Salisbury M.D.   On: 03/23/2020 19:05   ECHOCARDIOGRAM COMPLETE  Result Date: 03/24/2020    ECHOCARDIOGRAM REPORT   Patient Name:   ALANTIS BETHUNE Date of Exam: 03/24/2020 Medical Rec #:  [id number]     Height:       60.0 in Accession #:    [id number]    Weight:       131.0 lb Date of Birth:  October 26, 1926      BSA:          1.559 m Patient Age:    55 years  BP:           105/56 mmHg Patient Gender: F             HR:           59 bpm. Exam Location:  Inpatient Procedure: 2D Echo, Color Doppler, Cardiac Doppler and 3D Echo Indications:    R01.1 Murmur  History:        Patient has prior history of Echocardiogram examinations, most                 recent 03/16/2017. Risk Factors:Hypertension and Dyslipidemia.  Sonographer:    Raquel Sarna Senior RDCS Referring Phys: Coryell HEWITT IMPRESSIONS  1. Left ventricular ejection fraction, by estimation, is 55 to 60%. The  left ventricle has normal function. The left ventricle has no regional wall motion abnormalities. Left ventricular diastolic function could not be evaluated.  2. Right ventricular systolic function is normal. The right ventricular size is normal. There is mildly elevated pulmonary artery systolic pressure.  3. Tracings suggest atrial flutter with variable conduction, recommend clinical correlation.. Left atrial size was severely dilated.  4. Right atrial size was moderately dilated.  5. The mitral valve is normal in structure. Mild mitral valve regurgitation. No evidence of mitral stenosis.  6. Tricuspid valve regurgitation is mild to moderate.  7. The aortic valve is tricuspid. Aortic valve regurgitation is mild. Mild aortic valve sclerosis is present, with no evidence of aortic valve stenosis.  8. Aortic dilatation noted. There is moderate dilatation of the ascending aorta measuring 45 mm.  9. The inferior vena cava is dilated in size with >50% respiratory variability, suggesting right atrial pressure of 8 mmHg. FINDINGS  Left Ventricle: Left ventricular ejection fraction, by estimation, is 55 to 60%. The left ventricle has normal function. The left ventricle has no regional wall motion abnormalities. The left ventricular internal cavity size was normal in size. There is  no left ventricular hypertrophy. Left ventricular diastolic function could not be evaluated due to atrial fibrillation. Left ventricular diastolic function could not be evaluated. Right Ventricle: The right ventricular size is normal. No increase in right ventricular wall thickness. Right ventricular systolic function is normal. There is mildly elevated pulmonary artery systolic pressure. The tricuspid regurgitant velocity is 2.77  m/s, and with an assumed right atrial pressure of 8 mmHg, the estimated right ventricular systolic pressure is 65.0 mmHg. Left Atrium: Tracings suggest atrial flutter with variable conduction, recommend clinical  correlation. Left atrial size was severely dilated. Right Atrium: Right atrial size was moderately dilated. Pericardium: There is no evidence of pericardial effusion. Mitral Valve: The mitral valve is normal in structure. There is mild thickening of the mitral valve leaflet(s). There is mild calcification of the mitral valve leaflet(s). Mild mitral annular calcification. Mild mitral valve regurgitation. No evidence of  mitral valve stenosis. Tricuspid Valve: The tricuspid valve is normal in structure. Tricuspid valve regurgitation is mild to moderate. No evidence of tricuspid stenosis. Aortic Valve: The aortic valve is tricuspid. Aortic valve regurgitation is mild. Aortic regurgitation PHT measures 989 msec. Mild aortic valve sclerosis is present, with no evidence of aortic valve stenosis. There is mild calcification of the aortic valve. Aortic valve mean gradient measures 9.0 mmHg. Aortic valve peak gradient measures 19.2 mmHg. Aortic valve area, by VTI measures 1.73 cm. Pulmonic Valve: The pulmonic valve was not well visualized. Pulmonic valve regurgitation is not visualized. Aorta: Aortic dilatation noted. There is moderate dilatation of the ascending aorta measuring 45 mm. Venous: The  inferior vena cava is dilated in size with greater than 50% respiratory variability, suggesting right atrial pressure of 8 mmHg. IAS/Shunts: The atrial septum is grossly normal.  LEFT VENTRICLE PLAX 2D LVIDd:         3.50 cm  Diastology LVIDs:         2.70 cm  LV e' lateral:   6.64 cm/s LV PW:         0.90 cm  LV E/e' lateral: 15.5 LV IVS:        1.10 cm  LV e' medial:    6.85 cm/s LVOT diam:     2.00 cm  LV E/e' medial:  15.0 LV SV:         67 LV SV Index:   43 LVOT Area:     3.14 cm  RIGHT VENTRICLE RV S prime:     14.40 cm/s TAPSE (M-mode): 1.8 cm LEFT ATRIUM             Index       RIGHT ATRIUM           Index LA diam:        3.70 cm 2.37 cm/m  RA Area:     23.90 cm LA Vol (A2C):   92.8 ml 59.52 ml/m RA Volume:   70.00 ml   44.90 ml/m LA Vol (A4C):   85.9 ml 55.09 ml/m LA Biplane Vol: 90.2 ml 57.85 ml/m  AORTIC VALVE AV Area (Vmax):    1.43 cm AV Area (Vmean):   1.63 cm AV Area (VTI):     1.73 cm AV Vmax:           219.00 cm/s AV Vmean:          139.000 cm/s AV VTI:            0.386 m AV Peak Grad:      19.2 mmHg AV Mean Grad:      9.0 mmHg LVOT Vmax:         99.90 cm/s LVOT Vmean:        72.100 cm/s LVOT VTI:          0.212 m LVOT/AV VTI ratio: 0.55 AI PHT:            989 msec  AORTA Ao Root diam: 3.50 cm Ao Asc diam:  4.45 cm MITRAL VALVE                TRICUSPID VALVE MV Area (PHT): 2.93 cm     TR Peak grad:   30.7 mmHg MV Decel Time: 259 msec     TR Vmax:        277.00 cm/s MV E velocity: 103.00 cm/s MV A velocity: 25.30 cm/s   SHUNTS MV E/A ratio:  4.07         Systemic VTI:  0.21 m                             Systemic Diam: 2.00 cm Buford Dresser MD Electronically signed by Buford Dresser MD Signature Date/Time: 03/24/2020/3:53:08 PM    Final    DG Hip Unilat With Pelvis 2-3 Views Left  Result Date: 03/23/2020 CLINICAL DATA:  Left hip pain following a fall this morning. EXAM: DG HIP (WITH OR WITHOUT PELVIS) 2-3V LEFT COMPARISON:  11/04/2017. FINDINGS: Acute, comminuted left intertrochanteric fracture with varus angulation. Interval healing of the previously demonstrated left superior and inferior pubic ramus fractures. Atheromatous  arterial calcifications. Diffuse osteopenia. IMPRESSION: Acute, comminuted left intertrochanteric fracture with varus angulation. Electronically Signed   By: Claudie Revering M.D.   On: 03/23/2020 12:27   DG FEMUR MIN 2 VIEWS LEFT  Result Date: 03/23/2020 CLINICAL DATA:  Left IM nail placement. EXAM: DG C-ARM 1-60 MIN; LEFT FEMUR 2 VIEWS FLUOROSCOPY TIME:  Fluoroscopy Time:  46 seconds COMPARISON:  March 23, 2020 FINDINGS: Intraoperative fluoroscopic images from IM nail and screw fixation of left sub trochanteric femoral fracture fixation demonstrate placement of orthopedic  hardware with normal alignment and near anatomic osseous alignment. IMPRESSION: Intraoperative fluoroscopic images from IM nail and screw fixation of left sub trochanteric femoral fracture fixation. Electronically Signed   By: Fidela Salisbury M.D.   On: 03/23/2020 19:05    Assessment/Plan Essential hypertension Start on Norvasc 2.5 mg QD   Dementia with behavioral disturbance, unspecified dementia type ([place]) Discontinue Seroquel Continue Namenda  Blood loss anemia Decrase the dose of Iron due to Poor Appetie Poor appetite Already on Remeron Check CBC and CMP and Lipase Check Abdominal Xray for Constipation Weight loss Will have dietary talk to her Already on Remeron Hyperlipidemia, unspecified hyperlipidemia type On Zocor S/P Hip Repair Will discontinue Aspirin after 4 weeks Also Follows with Ortho Pain Controlled right now Doing well with therapy   Family/ staff Communication:   Labs/tests ordered:

## 2020-04-14 ENCOUNTER — Non-Acute Institutional Stay (SKILLED_NURSING_FACILITY): Payer: Medicare Other | Admitting: Nurse Practitioner

## 2020-04-14 ENCOUNTER — Encounter: Payer: Self-pay | Admitting: Nurse Practitioner

## 2020-04-14 ENCOUNTER — Encounter (HOSPITAL_COMMUNITY): Payer: Self-pay | Admitting: Family Medicine

## 2020-04-14 ENCOUNTER — Other Ambulatory Visit: Payer: Self-pay

## 2020-04-14 ENCOUNTER — Inpatient Hospital Stay (HOSPITAL_COMMUNITY)
Admission: EM | Admit: 2020-04-14 | Discharge: 2020-04-17 | DRG: 641 | Disposition: A | Discharge status: Skilled Nursing Facility | Payer: Medicare Other | Source: Skilled Nursing Facility | Attending: Internal Medicine | Admitting: Internal Medicine

## 2020-04-14 ENCOUNTER — Observation Stay (HOSPITAL_COMMUNITY): Payer: Medicare Other

## 2020-04-14 DIAGNOSIS — Z885 Allergy status to narcotic agent status: Secondary | ICD-10-CM

## 2020-04-14 DIAGNOSIS — E861 Hypovolemia: Secondary | ICD-10-CM | POA: Diagnosis present

## 2020-04-14 DIAGNOSIS — K219 Gastro-esophageal reflux disease without esophagitis: Secondary | ICD-10-CM | POA: Diagnosis present

## 2020-04-14 DIAGNOSIS — R17 Unspecified jaundice: Secondary | ICD-10-CM

## 2020-04-14 DIAGNOSIS — D7589 Other specified diseases of blood and blood-forming organs: Secondary | ICD-10-CM | POA: Diagnosis present

## 2020-04-14 DIAGNOSIS — Z881 Allergy status to other antibiotic agents status: Secondary | ICD-10-CM

## 2020-04-14 DIAGNOSIS — Z825 Family history of asthma and other chronic lower respiratory diseases: Secondary | ICD-10-CM

## 2020-04-14 DIAGNOSIS — Z87442 Personal history of urinary calculi: Secondary | ICD-10-CM

## 2020-04-14 DIAGNOSIS — K5901 Slow transit constipation: Secondary | ICD-10-CM

## 2020-04-14 DIAGNOSIS — I10 Essential (primary) hypertension: Secondary | ICD-10-CM

## 2020-04-14 DIAGNOSIS — Z801 Family history of malignant neoplasm of trachea, bronchus and lung: Secondary | ICD-10-CM

## 2020-04-14 DIAGNOSIS — Z66 Do not resuscitate: Secondary | ICD-10-CM | POA: Diagnosis present

## 2020-04-14 DIAGNOSIS — D729 Disorder of white blood cells, unspecified: Secondary | ICD-10-CM

## 2020-04-14 DIAGNOSIS — F0391 Unspecified dementia with behavioral disturbance: Secondary | ICD-10-CM

## 2020-04-14 DIAGNOSIS — C49A0 Gastrointestinal stromal tumor, unspecified site: Secondary | ICD-10-CM

## 2020-04-14 DIAGNOSIS — Z79899 Other long term (current) drug therapy: Secondary | ICD-10-CM

## 2020-04-14 DIAGNOSIS — S72001A Fracture of unspecified part of neck of right femur, initial encounter for closed fracture: Secondary | ICD-10-CM

## 2020-04-14 DIAGNOSIS — E870 Hyperosmolality and hypernatremia: Secondary | ICD-10-CM

## 2020-04-14 DIAGNOSIS — Z882 Allergy status to sulfonamides status: Secondary | ICD-10-CM

## 2020-04-14 DIAGNOSIS — Z20822 Contact with and (suspected) exposure to covid-19: Secondary | ICD-10-CM | POA: Diagnosis present

## 2020-04-14 DIAGNOSIS — H9190 Unspecified hearing loss, unspecified ear: Secondary | ICD-10-CM | POA: Diagnosis present

## 2020-04-14 DIAGNOSIS — Z8249 Family history of ischemic heart disease and other diseases of the circulatory system: Secondary | ICD-10-CM

## 2020-04-14 DIAGNOSIS — E785 Hyperlipidemia, unspecified: Secondary | ICD-10-CM | POA: Diagnosis present

## 2020-04-14 DIAGNOSIS — Z90710 Acquired absence of both cervix and uterus: Secondary | ICD-10-CM

## 2020-04-14 DIAGNOSIS — F0390 Unspecified dementia without behavioral disturbance: Secondary | ICD-10-CM | POA: Diagnosis present

## 2020-04-14 DIAGNOSIS — H3530 Unspecified macular degeneration: Secondary | ICD-10-CM | POA: Diagnosis present

## 2020-04-14 DIAGNOSIS — D72828 Other elevated white blood cell count: Secondary | ICD-10-CM

## 2020-04-14 DIAGNOSIS — Z803 Family history of malignant neoplasm of breast: Secondary | ICD-10-CM

## 2020-04-14 DIAGNOSIS — Z8679 Personal history of other diseases of the circulatory system: Secondary | ICD-10-CM

## 2020-04-14 DIAGNOSIS — D500 Iron deficiency anemia secondary to blood loss (chronic): Secondary | ICD-10-CM

## 2020-04-14 DIAGNOSIS — Z8719 Personal history of other diseases of the digestive system: Secondary | ICD-10-CM

## 2020-04-14 DIAGNOSIS — Z86011 Personal history of benign neoplasm of the brain: Secondary | ICD-10-CM

## 2020-04-14 DIAGNOSIS — E860 Dehydration: Secondary | ICD-10-CM | POA: Diagnosis present

## 2020-04-14 DIAGNOSIS — Z9049 Acquired absence of other specified parts of digestive tract: Secondary | ICD-10-CM

## 2020-04-14 DIAGNOSIS — E876 Hypokalemia: Secondary | ICD-10-CM | POA: Diagnosis present

## 2020-04-14 DIAGNOSIS — I712 Thoracic aortic aneurysm, without rupture: Secondary | ICD-10-CM | POA: Diagnosis present

## 2020-04-14 DIAGNOSIS — Z85828 Personal history of other malignant neoplasm of skin: Secondary | ICD-10-CM

## 2020-04-14 DIAGNOSIS — Z7982 Long term (current) use of aspirin: Secondary | ICD-10-CM

## 2020-04-14 LAB — CBC WITH DIFFERENTIAL/PLATELET
Abs Immature Granulocytes: 0.06 10*3/uL (ref 0.00–0.07)
Basophils Absolute: 0.1 10*3/uL (ref 0.0–0.1)
Basophils Relative: 1 %
Eosinophils Absolute: 0.1 10*3/uL (ref 0.0–0.5)
Eosinophils Relative: 1 %
HCT: 45.2 % (ref 36.0–46.0)
Hemoglobin: 13.4 g/dL (ref 12.0–15.0)
Immature Granulocytes: 1 %
Lymphocytes Relative: 15 %
Lymphs Abs: 1.4 10*3/uL (ref 0.7–4.0)
MCH: 30.7 pg (ref 26.0–34.0)
MCHC: 29.6 g/dL — ABNORMAL LOW (ref 30.0–36.0)
MCV: 103.7 fL — ABNORMAL HIGH (ref 80.0–100.0)
Monocytes Absolute: 0.9 10*3/uL (ref 0.1–1.0)
Monocytes Relative: 9 %
Neutro Abs: 6.8 10*3/uL (ref 1.7–7.7)
Neutrophils Relative %: 73 %
Platelets: 164 10*3/uL (ref 150–400)
RBC: 4.36 MIL/uL (ref 3.87–5.11)
RDW: 15.9 % — ABNORMAL HIGH (ref 11.5–15.5)
WBC: 9.4 10*3/uL (ref 4.0–10.5)
nRBC: 0 % (ref 0.0–0.2)

## 2020-04-14 LAB — COMPREHENSIVE METABOLIC PANEL
ALT: 12 U/L (ref 0–44)
AST: 20 U/L (ref 15–41)
Albumin: 3.2 g/dL — ABNORMAL LOW (ref 3.5–5.0)
Alkaline Phosphatase: 176 U/L — ABNORMAL HIGH (ref 38–126)
Anion gap: 13 (ref 5–15)
BUN: 29 mg/dL — ABNORMAL HIGH (ref 8–23)
CO2: 25 mmol/L (ref 22–32)
Calcium: 8.9 mg/dL (ref 8.9–10.3)
Chloride: 124 mmol/L — ABNORMAL HIGH (ref 98–111)
Creatinine, Ser: 0.48 mg/dL (ref 0.44–1.00)
GFR calc Af Amer: >60 mL/min (ref >60)
GFR calc non Af Amer: >60 mL/min (ref >60)
Glucose, Bld: 94 mg/dL (ref 70–99)
Potassium: 3.3 mmol/L — ABNORMAL LOW (ref 3.5–5.1)
Sodium: 162 mmol/L (ref 135–145)
Total Bilirubin: 2.4 mg/dL — ABNORMAL HIGH (ref 0.3–1.2)
Total Protein: 6.2 g/dL — ABNORMAL LOW (ref 6.5–8.1)

## 2020-04-14 LAB — SARS CORONAVIRUS 2 BY RT PCR (HOSPITAL ORDER, PERFORMED IN ~~LOC~~ HOSPITAL LAB): SARS Coronavirus 2: NEGATIVE

## 2020-04-14 MED ORDER — ONDANSETRON HCL 4 MG PO TABS: 4.0000 mg | ORAL_TABLET | Freq: Four times a day (QID) | ORAL | Status: DC | PRN

## 2020-04-14 MED ORDER — ACETAMINOPHEN 650 MG RE SUPP: 650.0000 mg | Freq: Four times a day (QID) | RECTAL | Status: DC | PRN

## 2020-04-14 MED ORDER — ONDANSETRON HCL 4 MG/2ML IJ SOLN: 4.0000 mg | Freq: Four times a day (QID) | INTRAMUSCULAR | Status: DC | PRN

## 2020-04-14 MED ORDER — OXYCODONE HCL 5 MG PO TABS: 5.0000 mg | ORAL_TABLET | Freq: Four times a day (QID) | ORAL | Status: DC | PRN

## 2020-04-14 MED ORDER — POTASSIUM CHLORIDE IN NACL 20-0.45 MEQ/L-% IV SOLN
INTRAVENOUS | Status: DC
  Filled 2020-04-14 (×2): qty 1000

## 2020-04-14 MED ORDER — POTASSIUM CHLORIDE CRYS ER 10 MEQ PO TBCR
10.0000 meq | EXTENDED_RELEASE_TABLET | Freq: Once | ORAL | Status: AC
  Administered 2020-04-15: 10 meq via ORAL
  Filled 2020-04-14: qty 1

## 2020-04-14 MED ORDER — SODIUM CHLORIDE 0.9 % IV BOLUS
1000.0000 mL | Freq: Once | INTRAVENOUS | Status: AC
  Administered 2020-04-14: 1000 mL via INTRAVENOUS

## 2020-04-14 MED ORDER — ACETAMINOPHEN 325 MG PO TABS: 650.0000 mg | ORAL_TABLET | Freq: Four times a day (QID) | ORAL | Status: DC | PRN

## 2020-04-14 NOTE — ED Provider Notes (Signed)
[facility] DEPT Provider Note   CSN: [id number] Arrival date & time: 04/14/20  1831     History Chief Complaint  Patient presents with  . Abnormal Lab    Chelsea Jordan is a 86 y.o. female.  Patient sent over from nursing home because of dehydration.  She had a sodium checked at the nursing home that was over 150.  She has a history of dementia  The history is provided by the nursing home. No language interpreter was used.  Weakness Severity:  Mild Onset quality:  Sudden Timing:  Constant Progression:  Worsening Chronicity:  Recurrent Context: not alcohol use   Relieved by:  Nothing Worsened by:  Nothing Ineffective treatments:  None tried      Past Medical History:  Diagnosis Date  . Anxiety   . Benign fundic gland polyps of stomach   . Benign gastrointestinal stromal tumor (GIST)   . Bronchitis   . Cancer ([place])    recent skin cancer left leg  excised about 8 days ago-remains with dressing intact.   . Chronic cystitis   . COLONIC POLYPS, HX OF   . Complication of anesthesia   . Diverticulosis   . DYSLIPIDEMIA   . Gastritis   . GERD (gastroesophageal reflux disease)   . Hemorrhoids   . Hiatal hernia   . HYPERTENSION   . Macular degeneration    optho q105mo - [facility]  . Meningioma ([place])   . MITRAL VALVE PROLAPSE   . NEPHROLITHIASIS   . OSTEOARTHRITIS   . OSTEOPOROSIS   . PONV (postoperative nausea and vomiting)   . Thoracic aortic aneurysm ([facility])    4.1 cm 2015    Patient Active Problem List   Diagnosis Date Noted  . Elevated bilirubin 04/14/2020  . Hypernatremia 04/14/2020  . Neutrophilic leukocytosis 35/70/1779  . Blood loss anemia 03/31/2020  . Slow transit constipation 03/31/2020  . Fall 03/25/2020  . Hip fracture, left, closed, initial encounter ([place]) 03/23/2020  . Hip fracture ([place]) 03/23/2020  . Insomnia 01/21/2020  . Weight loss 05/30/2019  . Dizziness 02/28/2019  . Dementia ([place]) 06/22/2018  . Olecranon  fracture, left, closed, initial encounter 11/05/2017  . Preventative health care 07/15/2016  . Hyponatremia 12/25/2014  . Lightheadedness 12/25/2014  . Aortic stenosis 12/20/2014  . 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 Medicare annual wellness visit, subsequent 07/12/2014  . Thoracic aortic aneurysm ([place]) 07/08/2014  . Recurrent UTI 03/12/2014  . Eustachian tube dysfunction 08/16/2013  . Macular degeneration   . Mitral valve disorder 11/17/2009  . Hyperlipidemia 09/24/2009  . Essential hypertension 09/24/2009  . Osteoporosis 07/15/2009  . MENINGIOMA 10/20/2007  . 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 Asthma 10/20/2007  . NEPHROLITHIASIS 10/20/2007    Past Surgical History:  Procedure Laterality Date  . ABDOMINAL HYSTERECTOMY    . APPENDECTOMY    . BREAST SURGERY    . [place]   no PCI, performed in [place]  . Cataract surgery  2000  . CHOLECYSTECTOMY    . COLONOSCOPY    . ESOPHAGOGASTRODUODENOSCOPY    . EUS N/A 05/16/2014   Procedure: UPPER ENDOSCOPIC ULTRASOUND (EUS) LINEAR;  Surgeon: Milus Banister, MD;  Location: [facility];  Service: Endoscopy;  Laterality: N/A;  . INSERTION OF MESH  07/04/2012   Procedure: INSERTION OF MESH;  Surgeon: Odis Hollingshead, MD;  Location: [facility];  Service: General;  Laterality: N/A;  . INTRAMEDULLARY (IM) NAIL  INTERTROCHANTERIC Left 03/23/2020   Procedure: INTRAMEDULLARY (IM) NAIL INTERTROCHANTRIC HIP;  Surgeon: Wylene Simmer, MD;  Location: [facility];  Service: Orthopedics;  Laterality: Left;  . KNEE SURGERY    . LEG SKIN LESION  BIOPSY / EXCISION Left    8 days ago pending pathology, remains with dressing.  . SURGERY OF LIP  in 2002  . TONSILLECTOMY    . VENTRAL HERNIA REPAIR  07/04/2012   Procedure: HERNIA REPAIR VENTRAL ADULT;  Surgeon: Odis Hollingshead, MD;  Location: [facility];  Service: General;  Laterality: N/A;     OB History   No obstetric  history on file.     Family History  Problem Relation Age of Onset  . Heart disease Other   . Heart disease Mother   . Heart disease Father   . Emphysema Father   . Heart attack Father   . Breast cancer Sister   . Lung cancer Sister   . Prostate cancer Son   . Prostate cancer Brother     Social History   Tobacco Use  . Smoking status: Never Smoker  . Smokeless tobacco: Never Used  . Tobacco comment: Married, lives with spouse, Enjoys golf. Retired  Media planner  . Vaping Use: Never used  Substance Use Topics  . Alcohol use: Yes    Alcohol/week: 6.0 standard drinks    Types: 6 Standard drinks or equivalent per week    Comment: Wine -nightly  . Drug use: No    Home Medications Prior to Admission medications   Medication Sig Start Date End Date Taking? Authorizing Provider  acetaminophen (TYLENOL) 500 MG tablet Take 1,000 mg by mouth every 8 (eight) hours as needed.    [provider]  amLODipine (NORVASC) 2.5 MG tablet Take 2.5 mg by mouth daily.    [provider]  ascorbic acid (VITAMIN C) 500 MG tablet Take 1 tablet (500 mg total) by mouth daily. 03/29/20   Allie Bossier, MD  aspirin EC 81 MG tablet Take 1 tablet (81 mg total) by mouth 2 (two) times daily. 03/24/20   Corky Sing, PA-C  bisacodyl (DULCOLAX) 5 MG EC tablet Take 10 mg by mouth daily as needed for moderate constipation.    [provider]  docusate sodium (COLACE) 100 MG capsule Take 1 capsule (100 mg total) by mouth 2 (two) times daily. While taking narcotic pain medicine. 03/24/20   Corky Sing, PA-C  esomeprazole (NEXIUM) 40 MG capsule Take 40 mg by mouth 2 (two) times daily before a meal.    [provider]  ferrous sulfate 325 (65 FE) MG tablet Take 1 tablet (325 mg total) by mouth daily with breakfast. Patient taking differently: Take 325 mg by mouth. Once A Day on Mon, Wed, Fri 03/29/20   Allie Bossier, MD  melatonin 5 MG TABS Take 5 mg by mouth at  bedtime.     [provider]  memantine (NAMENDA) 10 MG tablet Take 10 mg by mouth 2 (two) times daily.    [provider]  mirtazapine (REMERON) 7.5 MG tablet Take 7.5 mg by mouth at bedtime.    [provider]  ondansetron (ZOFRAN) 4 MG tablet Take 1 tablet (4 mg total) by mouth every 6 (six) hours as needed for nausea. 03/29/20   Allie Bossier, MD  oxyCODONE (OXY IR/ROXICODONE) 5 MG immediate release tablet Take 5 mg by mouth every 4 (four) hours as needed for severe pain. 04/04/20 04/18/20  [provider]  senna (SENOKOT) 8.6 MG TABS tablet Take 2 tablets (17.2 mg total) by mouth 2 (two) times daily. 03/24/20   Corky Sing, PA-C  simvastatin (ZOCOR) 10 MG tablet Take 5 mg by mouth daily.    [provider]    Allergies    Pentothal [thiopental], Codeine, Levofloxacin, Oxycodone-acetaminophen, Sulfonamide derivatives, and Tape  Review of Systems   Review of Systems  Unable to perform ROS: Dementia  Neurological: Positive for weakness.    Physical Exam Updated Vital Signs BP 138/72 (BP Location: Left Arm)   Pulse 73   Temp 97.7 F (36.5 C) (Oral)   Resp 20   Ht 5' (1.524 m)   Wt 51.6 kg   SpO2 96%   BMI 22.20 kg/m   Physical Exam Vitals and nursing note reviewed.  Constitutional:      Appearance: She is well-developed.     Comments: Oriented to person only  HENT:     Head: Normocephalic.     Comments: Dry mucous membrane    Nose:     Comments: Dry mucous membranes Eyes:     General: No scleral icterus.    Conjunctiva/sclera: Conjunctivae normal.  Neck:     Thyroid: No thyromegaly.  Cardiovascular:     Rate and Rhythm: Normal rate and regular rhythm.     Heart sounds: No murmur heard.  No friction rub. No gallop.   Pulmonary:     Breath sounds: No stridor. No wheezing or rales.  Chest:     Chest wall: No tenderness.  Abdominal:     General: There is no distension.     Tenderness: There is no abdominal  tenderness. There is no rebound.  Musculoskeletal:        General: Normal range of motion.     Cervical back: Neck supple.  Lymphadenopathy:     Cervical: No cervical adenopathy.  Skin:    Findings: No erythema or rash.  Neurological:     Motor: No abnormal muscle tone.     Coordination: Coordination normal.     Comments: Oriented to person only  Psychiatric:        Behavior: Behavior normal.     ED Results / Procedures / Treatments   Labs (all labs ordered are listed, but only abnormal results are displayed) Labs Reviewed  CBC WITH DIFFERENTIAL/PLATELET - Abnormal; Notable for the following components:      Result Value   MCV 103.7 (*)    MCHC 29.6 (*)    RDW 15.9 (*)    All other components within normal limits  COMPREHENSIVE METABOLIC PANEL - Abnormal; Notable for the following components:   Sodium 162 (*)    Potassium 3.3 (*)    Chloride 124 (*)    BUN 29 (*)    Total Protein 6.2 (*)    Albumin 3.2 (*)    Alkaline Phosphatase 176 (*)    Total Bilirubin 2.4 (*)    All other components within normal limits  SARS CORONAVIRUS 2 BY RT PCR (HOSPITAL ORDER, [facility])  URINALYSIS, ROUTINE W REFLEX MICROSCOPIC    EKG None  Radiology No results found.  Procedures Procedures (including critical care time)  Medications Ordered in ED Medications  sodium chloride 0.9 % bolus 1,000 mL (1,000 mLs Intravenous New Bag/Given (Non-Interop) 04/14/20 2014)    ED Course  I have reviewed the triage vital signs and the nursing notes.  Pertinent labs & imaging results that were available  during my care of the patient were reviewed by me and considered in my medical decision making (see chart for details).    MDM Rules/Calculators/A&P                          Patient with dehydration and hypernatremia she will be admitted to medicine      This patient presents to the ED for concern of dehydration, this involves an extensive number of treatment  options, and is a complaint that carries with it a high risk of complications and morbidity.  The differential diagnosis includes dehydration  Lab Tests:   I Ordered, reviewed, and interpreted labs, which included CBC and chemistries that show sodium of 162  Medicines ordered:  I ordered medication normal saline Imaging Studies ordered:   Additional history obtained:   Additional history obtained from nursing home  Previous records obtained and reviewed.  Consultations Obtained:   I consulted hospitalist and discussed lab and imaging findings  Reevaluation:  After the interventions stated above, I reevaluated the patient and found mild improvement  Critical Interventions:  .   Final Clinical Impression(s) / ED Diagnoses Final diagnoses:  Hypernatremia    Rx / DC Orders ED Discharge Orders    None       Milton Ferguson, MD 04/14/20 2121

## 2020-04-14 NOTE — Assessment & Plan Note (Signed)
S/p IM left intertrochanteric comminuted fracture.

## 2020-04-14 NOTE — Assessment & Plan Note (Signed)
Na 157 04/11/20, limited oral intakes, ED vs D5 75cc/hr x2068m, K 3.4 04/11/20, adding po Kcl 249m qd, repeat BMP/eGFR upon completion of IVF

## 2020-04-14 NOTE — Progress Notes (Signed)
Location:   [facility] Room Number: 2 Place of Service:  SNF (31) Provider: [facility] Ahrendt NP  Chelsea Dad, MD  Patient Care Team: Chelsea Dad, MD as PCP - General (Internal Medicine) Chelsea Breeding, MD as Referring Physician (Cardiology) Chelsea Rockers, MD as Referring Physician (Pulmonary Disease) Chelsea Mayer, MD as Referring Physician (Gastroenterology) Chelsea Arabian, MD as Referring Physician (Orthopedic Surgery) Chelsea Quitter, MD as Referring Physician Chelsea Confer, MD (General Surgery)  Extended Emergency Contact Information Primary Emergency Contact: Chelsea Jordan Address: [place],  [address] Chelsea Jordan of [place] Phone: [phone] Mobile Phone: [phone] Relation: Spouse Secondary Emergency Contact: Chelsea Jordan of [place] Phone: [phone] Mobile Phone: [phone] Relation: Son  Code Status:  DNR Goals of care: Advanced Directive information Advanced Directives 04/14/2020  Does Patient Have a Medical Advance Directive? Yes  Type of Advance Directive Out of facility DNR (pink MOST or yellow form)  Does patient want to make changes to medical advance directive? No - Patient declined  Copy of [place] in Chart? Yes - validated most recent copy scanned in chart (See row information)  Would patient like information on creating a medical advance directive? No - Patient declined  Pre-existing out of facility DNR order (yellow form or pink MOST form) Yellow form placed in chart (order not valid for inpatient use)     Chief Complaint  Patient presents with  . Acute Visit    hypernatremia    HPI:  Pt is a 86 y.o. female seen today for an acute visit for hypernatremia, serum Na 157 04/11/20, limited oral intake, more confused, generalized weakness.   S/p IM left intertrochanteric comminuted fracture.   Afib, Echo 55-60% EF             HTN, takes Hydralazine  16m tid             Dementia, takes Memantine, Mirtazapine 7.529mqd, Quetiapine 12.3m70maily prn             Post Op anemia, transfused 1 unit PRBC 03/27/20, takes Fe daily. Hgb 13.3 04/11/20, likely hemoconcentration.              Constipation, takes Colace bid, prn Bisacodyl 32m23mily, Senna II bid             GIST,  takes Esomeprazole 40mg50m prn Zofran 4mg q32m   Past Medical History:  Diagnosis Date  . Anxiety   . Benign fundic gland polyps of stomach   . Benign gastrointestinal stromal tumor (GIST)   . Bronchitis   . Cancer (HCC)  [place] cancer left leg  excised about 8 days ago-remains with dressing intact.   . Chronic cystitis   . COLONIC POLYPS, HX OF   . Complication of anesthesia   . Diverticulosis   . DYSLIPIDEMIA   . Gastritis   . GERD (gastroesophageal reflux disease)   . Hemorrhoids   . Hiatal hernia   . HYPERTENSION   . Macular degeneration    optho q6mo - 63moer  . Meningioma (HCC)   [place]   . NEPHROLITHIASIS   . OSTEOARTHRITIS   . OSTEOPOROSIS   . PONV (postoperative nausea and vomiting)   . Thoracic aortic aneurysm (HCC)    4.1 cm 2015   Past Surgical History:  Procedure Laterality Date  . ABDOMINAL HYSTERECTOMY    .  APPENDECTOMY    . BREAST SURGERY    . [place]   no PCI, performed in [place]  . Cataract surgery  2000  . CHOLECYSTECTOMY    . COLONOSCOPY    . ESOPHAGOGASTRODUODENOSCOPY    . EUS N/A 05/16/2014   Procedure: UPPER ENDOSCOPIC ULTRASOUND (EUS) LINEAR;  Surgeon: Milus Banister, MD;  Location: [facility];  Service: Endoscopy;  Laterality: N/A;  . INSERTION OF MESH  07/04/2012   Procedure: INSERTION OF MESH;  Surgeon: Odis Hollingshead, MD;  Location: [facility];  Service: General;  Laterality: N/A;  . INTRAMEDULLARY (IM) NAIL INTERTROCHANTERIC Left 03/23/2020   Procedure: INTRAMEDULLARY (IM) NAIL INTERTROCHANTRIC HIP;  Surgeon: Wylene Simmer, MD;  Location: [facility];  Service: Orthopedics;   Laterality: Left;  . KNEE SURGERY    . LEG SKIN LESION  BIOPSY / EXCISION Left    8 days ago pending pathology, remains with dressing.  . SURGERY OF LIP  in 2002  . TONSILLECTOMY    . VENTRAL HERNIA REPAIR  07/04/2012   Procedure: HERNIA REPAIR VENTRAL ADULT;  Surgeon: Odis Hollingshead, MD;  Location: [facility];  Service: General;  Laterality: N/A;    Allergies  Allergen Reactions  . Pentothal [Thiopental] Nausea And Vomiting  . Codeine   . Levofloxacin   . Oxycodone-Acetaminophen   . Sulfonamide Derivatives     REACTION: GI upset/nausea/vomiting  . Tape Rash    Per patient adhesive tape    Allergies as of 04/14/2020      Reactions   Pentothal [thiopental] Nausea And Vomiting   Codeine    Levofloxacin    Oxycodone-acetaminophen    Sulfonamide Derivatives    REACTION: GI upset/nausea/vomiting   Tape Rash   Per patient adhesive tape      Medication List       Accurate as of April 14, 2020  7:27 PM. If you have any questions, ask your nurse or doctor.        STOP taking these medications   hydrALAZINE 10 MG tablet Commonly known as: APRESOLINE   QUEtiapine 25 MG tablet Commonly known as: SEROQUEL     TAKE these medications   acetaminophen 500 MG tablet Commonly known as: TYLENOL Take 1,000 mg by mouth every 8 (eight) hours as needed for moderate pain.   amLODipine 2.5 MG tablet Commonly known as: NORVASC Take 2.5 mg by mouth daily.   ascorbic acid 500 MG tablet Commonly known as: VITAMIN C Take 1 tablet (500 mg total) by mouth daily.   aspirin EC 81 MG tablet Take 1 tablet (81 mg total) by mouth 2 (two) times daily.   bisacodyl 5 MG EC tablet Commonly known as: DULCOLAX Take 10 mg by mouth daily as needed for moderate constipation.   docusate sodium 100 MG capsule Commonly known as: Colace Take 1 capsule (100 mg total) by mouth 2 (two) times daily. While taking narcotic pain medicine.   esomeprazole 40 MG capsule Commonly known as: NEXIUM Take 40 mg  by mouth 2 (two) times daily before a meal.   ferrous sulfate 325 (65 FE) MG tablet Take 1 tablet (325 mg total) by mouth daily with breakfast. What changed:   when to take this  additional instructions   melatonin 5 MG Tabs Take 5 mg by mouth at bedtime.   memantine 10 MG tablet Commonly known as: NAMENDA Take 10 mg by mouth 2 (two) times daily.   mirtazapine 7.5 MG tablet Commonly known as: REMERON Take  7.5 mg by mouth at bedtime.   ondansetron 4 MG tablet Commonly known as: ZOFRAN Take 1 tablet (4 mg total) by mouth every 6 (six) hours as needed for nausea.   oxyCODONE 5 MG immediate release tablet Commonly known as: Oxy IR/ROXICODONE Take 5 mg by mouth every 4 (four) hours as needed for severe pain.   senna 8.6 MG Tabs tablet Commonly known as: SENOKOT Take 2 tablets (17.2 mg total) by mouth 2 (two) times daily.   simvastatin 10 MG tablet Commonly known as: ZOCOR Take 5 mg by mouth daily.       Review of Systems  Constitutional: Positive for activity change, appetite change and fatigue. Negative for chills, diaphoresis and fever.  HENT: Positive for hearing loss. Negative for congestion and voice change.   Eyes: Negative for visual disturbance.  Respiratory: Negative for cough, shortness of breath and wheezing.   Cardiovascular: Positive for leg swelling.  Gastrointestinal: Negative for abdominal distention, abdominal pain and constipation.  Genitourinary: Negative for difficulty urinating, dysuria and urgency.  Musculoskeletal: Positive for arthralgias. Negative for gait problem.       Left hip  Skin: Positive for wound. Negative for color change.       Left hip surgical incisions x2  Neurological: Negative for dizziness, speech difficulty, weakness and numbness.       Dementia  Psychiatric/Behavioral: Positive for agitation, behavioral problems and confusion. Negative for sleep disturbance. The patient is not nervous/anxious.     Immunization History    Administered Date(s) Administered  . Influenza Split 06/07/2011, 06/13/2012, 06/15/2013  . Influenza Whole 06/06/2009, 05/18/2010  . Influenza, High Dose Seasonal PF 06/15/2013, 06/23/2017, 06/22/2018, 04/30/2019  . Influenza-Unspecified 06/07/2015, 05/28/2016  . Moderna SARS-COVID-2 Vaccination 09/10/2019, 10/08/2019  . Pneumococcal Conjugate-13 07/12/2014  . Pneumococcal Polysaccharide-23 09/07/2007  . Td 09/07/2007  . Tdap 06/21/2016   Pertinent  Health Maintenance Due  Topic Date Due  . INFLUENZA VACCINE  04/06/2020  . DEXA SCAN  Completed  . PNA vac Low Risk Adult  Completed   Fall Risk  10/02/2019 09/26/2019 06/20/2019 02/28/2019 02/14/2019  Falls in the past year? 0 0 1 1 1   Number falls in past yr: 0 0 0 0 0  Injury with Fall? - - 1 1 1   Comment - - - - -  Risk Factor Category  - - - - -  Risk for fall due to : - - - - -  Follow up - - - - -   Functional Status Survey:    Vitals:   04/14/20 1601  BP: (!) 159/94  Pulse: 61  Resp: 14  Temp: 98.1 F (36.7 C)  SpO2: 96%  Weight: 113 lb 11.2 oz (51.6 kg)  Height: 5' (1.524 m)   Body mass index is 22.21 kg/m. Physical Exam Vitals and nursing note reviewed.  Constitutional:      Comments: Generalized weakness, sunken eyes.   HENT:     Head: Normocephalic and atraumatic.     Mouth/Throat:     Mouth: Mucous membranes are dry.  Eyes:     Extraocular Movements: Extraocular movements intact.     Conjunctiva/sclera: Conjunctivae normal.     Pupils: Pupils are equal, round, and reactive to light.  Cardiovascular:     Rate and Rhythm: Normal rate and regular rhythm.     Heart sounds: Murmur heard.   Pulmonary:     Breath sounds: Normal breath sounds. No wheezing, rhonchi or rales.  Abdominal:     General:  Bowel sounds are normal.     Palpations: Abdomen is soft.     Tenderness: There is no abdominal tenderness.  Musculoskeletal:        General: Tenderness present.     Cervical back: Normal range of motion and  neck supple.     Right lower leg: No edema.     Left lower leg: No edema.     Comments: Left hip pain.  Skin:    General: Skin is warm and dry.     Findings: Bruising present.     Comments: Left hip surgical incisions x2 healed. Sunken eyes and cheeks. Left leg ecchymoses resolving   Neurological:     General: No focal deficit present.     Mental Status: She is alert. Mental status is at baseline.     Motor: No weakness.     Coordination: Coordination normal.     Gait: Gait abnormal.     Comments: Oriented to person  Psychiatric:        Mood and Affect: Mood normal.        Behavior: Behavior normal.     Labs reviewed: Recent Labs    03/27/20 0401 03/27/20 0401 03/28/20 0740 03/28/20 0740 03/29/20 0711 03/29/20 0711 04/03/20 0000 04/14/20 1956 04/15/20 0536  NA 142   < > 141   < > 141  --  145 162* 163*  K 3.9   < > 3.7   < > 3.7   < > 3.3* 3.3* 3.2*  CL 107   < > 107   < > 107   < > 109* 124* 127*  CO2 29   < > 24   < > 25   < > 23* 25 25  GLUCOSE 96   < > 106*   < > 107*  --   --  94 84  BUN 15   < > 18   < > 17  --  21 29* 25*  CREATININE 0.54   < > 0.53   < > 0.56  --  0.5 0.48 0.43*  CALCIUM 8.3*   < > 8.6*   < > 8.6*   < > 9.0 8.9 8.8*  MG 1.8   < > 2.0  --  1.9  --   --   --  1.7  PHOS 3.1  --  3.1  --  3.2  --   --   --   --    < > = values in this interval not displayed.   Recent Labs    03/28/20 0740 03/28/20 0740 03/29/20 0711 04/03/20 0000 04/14/20 1956  AST 16   < > 17 15 20   ALT 13   < > 14 9 12   ALKPHOS 59   < > 67 114 176*  BILITOT 1.8*  --  1.8*  --  2.4*  PROT 5.0*  --  5.0*  --  6.2*  ALBUMIN 2.6*   < > 2.6* 3.4* 3.2*   < > = values in this interval not displayed.   Recent Labs    01/03/20 0000 03/29/20 0711 04/03/20 0000 04/14/20 1956 04/15/20 0536  WBC   < > 9.1 9.1 9.4 7.9  NEUTROABS  --  6.6 7,007 6.8  --   HGB   < > 9.4* 11.4* 13.4 12.4  HCT   < > 29.6* 35* 45.2 42.0  MCV  --  95.5  --  103.7* 102.7*  PLT   < > 201 256  164  142*   < > = values in this interval not displayed.   Lab Results  Component Value Date   TSH 1.998 03/23/2020   No results found for: HGBA1C Lab Results  Component Value Date   CHOL 194 08/27/2019   HDL 87 08/27/2019   LDLCALC 87 08/27/2019   LDLDIRECT 97.7 06/22/2013   TRIG 104 08/27/2019   CHOLHDL 2.2 08/27/2019    Significant Diagnostic Results in last 30 days:  DG Chest 1 View  Result Date: 03/23/2020 CLINICAL DATA:  [place] this morning. EXAM: CHEST  1 VIEW COMPARISON:  04/17/2015 FINDINGS: Interval enlargement of the cardiac silhouette with increased prominence of the pulmonary vasculature and interstitial markings. Diffuse osteopenia is again demonstrated as well as mild moderate thoracolumbar scoliosis and degenerative changes. IMPRESSION: Interval cardiomegaly and mild changes of congestive heart failure. Electronically Signed   By: Claudie Revering M.D.   On: 03/23/2020 12:29   DG C-Arm 1-60 Min  Result Date: 03/23/2020 CLINICAL DATA:  Left IM nail placement. EXAM: DG C-ARM 1-60 MIN; LEFT FEMUR 2 VIEWS FLUOROSCOPY TIME:  Fluoroscopy Time:  46 seconds COMPARISON:  March 23, 2020 FINDINGS: Intraoperative fluoroscopic images from IM nail and screw fixation of left sub trochanteric femoral fracture fixation demonstrate placement of orthopedic hardware with normal alignment and near anatomic osseous alignment. IMPRESSION: Intraoperative fluoroscopic images from IM nail and screw fixation of left sub trochanteric femoral fracture fixation. Electronically Signed   By: Fidela Salisbury M.D.   On: 03/23/2020 19:05   ECHOCARDIOGRAM COMPLETE  Result Date: 03/24/2020    ECHOCARDIOGRAM REPORT   Patient Name:   Chelsea Jordan Date of Exam: 03/24/2020 Medical Rec #:  [id number]     Height:       60.0 in Accession #:    [id number]    Weight:       131.0 lb Date of Birth:  1927-05-01      BSA:          1.559 m Patient Age:    21 years      BP:           105/56 mmHg Patient Gender: F             HR:            59 bpm. Exam Location:  Inpatient Procedure: 2D Echo, Color Doppler, Cardiac Doppler and 3D Echo Indications:    R01.1 Murmur  History:        Patient has prior history of Echocardiogram examinations, most                 recent 03/16/2017. Risk Factors:Hypertension and Dyslipidemia.  Sonographer:    Raquel Sarna Senior RDCS Referring Phys: Glorieta HEWITT IMPRESSIONS  1. Left ventricular ejection fraction, by estimation, is 55 to 60%. The left ventricle has normal function. The left ventricle has no regional wall motion abnormalities. Left ventricular diastolic function could not be evaluated.  2. Right ventricular systolic function is normal. The right ventricular size is normal. There is mildly elevated pulmonary artery systolic pressure.  3. Tracings suggest atrial flutter with variable conduction, recommend clinical correlation.. Left atrial size was severely dilated.  4. Right atrial size was moderately dilated.  5. The mitral valve is normal in structure. Mild mitral valve regurgitation. No evidence of mitral stenosis.  6. Tricuspid valve regurgitation is mild to moderate.  7. The aortic valve is tricuspid. Aortic valve regurgitation is mild. Mild aortic valve sclerosis is present, with no  evidence of aortic valve stenosis.  8. Aortic dilatation noted. There is moderate dilatation of the ascending aorta measuring 45 mm.  9. The inferior vena cava is dilated in size with >50% respiratory variability, suggesting right atrial pressure of 8 mmHg. FINDINGS  Left Ventricle: Left ventricular ejection fraction, by estimation, is 55 to 60%. The left ventricle has normal function. The left ventricle has no regional wall motion abnormalities. The left ventricular internal cavity size was normal in size. There is  no left ventricular hypertrophy. Left ventricular diastolic function could not be evaluated due to atrial fibrillation. Left ventricular diastolic function could not be evaluated. Right Ventricle: The right  ventricular size is normal. No increase in right ventricular wall thickness. Right ventricular systolic function is normal. There is mildly elevated pulmonary artery systolic pressure. The tricuspid regurgitant velocity is 2.77  m/s, and with an assumed right atrial pressure of 8 mmHg, the estimated right ventricular systolic pressure is 62.6 mmHg. Left Atrium: Tracings suggest atrial flutter with variable conduction, recommend clinical correlation. Left atrial size was severely dilated. Right Atrium: Right atrial size was moderately dilated. Pericardium: There is no evidence of pericardial effusion. Mitral Valve: The mitral valve is normal in structure. There is mild thickening of the mitral valve leaflet(s). There is mild calcification of the mitral valve leaflet(s). Mild mitral annular calcification. Mild mitral valve regurgitation. No evidence of  mitral valve stenosis. Tricuspid Valve: The tricuspid valve is normal in structure. Tricuspid valve regurgitation is mild to moderate. No evidence of tricuspid stenosis. Aortic Valve: The aortic valve is tricuspid. Aortic valve regurgitation is mild. Aortic regurgitation PHT measures 989 msec. Mild aortic valve sclerosis is present, with no evidence of aortic valve stenosis. There is mild calcification of the aortic valve. Aortic valve mean gradient measures 9.0 mmHg. Aortic valve peak gradient measures 19.2 mmHg. Aortic valve area, by VTI measures 1.73 cm. Pulmonic Valve: The pulmonic valve was not well visualized. Pulmonic valve regurgitation is not visualized. Aorta: Aortic dilatation noted. There is moderate dilatation of the ascending aorta measuring 45 mm. Venous: The inferior vena cava is dilated in size with greater than 50% respiratory variability, suggesting right atrial pressure of 8 mmHg. IAS/Shunts: The atrial septum is grossly normal.  LEFT VENTRICLE PLAX 2D LVIDd:         3.50 cm  Diastology LVIDs:         2.70 cm  LV e' lateral:   6.64 cm/s LV PW:          0.90 cm  LV E/e' lateral: 15.5 LV IVS:        1.10 cm  LV e' medial:    6.85 cm/s LVOT diam:     2.00 cm  LV E/e' medial:  15.0 LV SV:         67 LV SV Index:   43 LVOT Area:     3.14 cm  RIGHT VENTRICLE RV S prime:     14.40 cm/s TAPSE (M-mode): 1.8 cm LEFT ATRIUM             Index       RIGHT ATRIUM           Index LA diam:        3.70 cm 2.37 cm/m  RA Area:     23.90 cm LA Vol (A2C):   92.8 ml 59.52 ml/m RA Volume:   70.00 ml  44.90 ml/m LA Vol (A4C):   85.9 ml 55.09 ml/m LA Biplane Vol: 90.2 ml  57.85 ml/m  AORTIC VALVE AV Area (Vmax):    1.43 cm AV Area (Vmean):   1.63 cm AV Area (VTI):     1.73 cm AV Vmax:           219.00 cm/s AV Vmean:          139.000 cm/s AV VTI:            0.386 m AV Peak Grad:      19.2 mmHg AV Mean Grad:      9.0 mmHg LVOT Vmax:         99.90 cm/s LVOT Vmean:        72.100 cm/s LVOT VTI:          0.212 m LVOT/AV VTI ratio: 0.55 AI PHT:            989 msec  AORTA Ao Root diam: 3.50 cm Ao Asc diam:  4.45 cm MITRAL VALVE                TRICUSPID VALVE MV Area (PHT): 2.93 cm     TR Peak grad:   30.7 mmHg MV Decel Time: 259 msec     TR Vmax:        277.00 cm/s MV E velocity: 103.00 cm/s MV A velocity: 25.30 cm/s   SHUNTS MV E/A ratio:  4.07         Systemic VTI:  0.21 m                             Systemic Diam: 2.00 cm Buford Dresser MD Electronically signed by Buford Dresser MD Signature Date/Time: 03/24/2020/3:53:08 PM    Final    DG Hip Unilat With Pelvis 2-3 Views Left  Result Date: 03/23/2020 CLINICAL DATA:  Left hip pain following a fall this morning. EXAM: DG HIP (WITH OR WITHOUT PELVIS) 2-3V LEFT COMPARISON:  11/04/2017. FINDINGS: Acute, comminuted left intertrochanteric fracture with varus angulation. Interval healing of the previously demonstrated left superior and inferior pubic ramus fractures. Atheromatous arterial calcifications. Diffuse osteopenia. IMPRESSION: Acute, comminuted left intertrochanteric fracture with varus angulation. Electronically  Signed   By: Claudie Revering M.D.   On: 03/23/2020 12:27   DG FEMUR MIN 2 VIEWS LEFT  Result Date: 03/23/2020 CLINICAL DATA:  Left IM nail placement. EXAM: DG C-ARM 1-60 MIN; LEFT FEMUR 2 VIEWS FLUOROSCOPY TIME:  Fluoroscopy Time:  46 seconds COMPARISON:  March 23, 2020 FINDINGS: Intraoperative fluoroscopic images from IM nail and screw fixation of left sub trochanteric femoral fracture fixation demonstrate placement of orthopedic hardware with normal alignment and near anatomic osseous alignment. IMPRESSION: Intraoperative fluoroscopic images from IM nail and screw fixation of left sub trochanteric femoral fracture fixation. Electronically Signed   By: Fidela Salisbury M.D.   On: 03/23/2020 19:05   US Abdomen Limited RUQ  Result Date: 04/14/2020 CLINICAL DATA:  86 year old female with hyperbilirubinemia. EXAM: ULTRASOUND ABDOMEN LIMITED RIGHT UPPER QUADRANT COMPARISON:  None. FINDINGS: Gallbladder: Cholecystectomy. Common bile duct: Diameter: 6 mm Liver: No focal lesion identified. Within normal limits in parenchymal echogenicity. Portal vein is patent on color Doppler imaging with normal direction of blood flow towards the liver. Other: None. IMPRESSION: Cholecystectomy, otherwise unremarkable right upper quadrant ultrasound. Electronically Signed   By: Anner Crete M.D.   On: 04/14/2020 22:39    Assessment/Plan Hypernatremia Na 157 04/11/20, limited oral intakes, ED vs D5 75cc/hr x2048m, K 3.4 04/11/20, adding po Kcl 24m qd, repeat BMP/eGFR upon  completion of IVF  Neutrophilic leukocytosis 05/07/01 neutrophils 84.7%, infection vs hemoconcentration, will do UA C/S  Elevated bilirubin 04/14/20 total bilirubin 1.7, direct bilirubin 0.7, alk phos 187, may consider US liver, gallbladder, pancrease.   Hip fracture (HCC) S/p IM left intertrochanteric comminuted fracture.   Blood loss anemia  Post Op anemia, transfused 1 unit PRBC 03/27/20, takes Fe daily. Hgb 13.3 04/11/20, likely hemoconcentration.     Essential hypertension Blood pressure is controlled, continue Hydralazine  Gastrointestinal stromal tumor (GIST) - esophagus Stable, continue Esomeprazole 65m qd, prn Zofran 477mq6hr    Slow transit constipation 04/11/20 X-ray abd: no acute abdominal pathology, multiple old  Continue Colace bid, prn Bisacodyl 1072maily, Senna II bid   Dementia (HCC) Advanced, continue  Memantine, Mirtazapine 7.5mg32m, Quetiapine 12.5mg 92mly prn     Family/ staff Communication: plan of care reviewed with the patient, the patient's husband HPOA, and charge nurse.   Labs/tests ordered: BMP/eGFR, UA C/S  Time spend 35 minutes.

## 2020-04-14 NOTE — H&P (Signed)
History and Physical    Chelsea Jordan:[id number] DOB: [birth year] DOA: 04/14/2020  PCP: Virgie Dad, MD   Patient coming from: [facility]   Chief Complaint: Elevated sodium level   HPI: Chelsea Jordan is a 86 y.o. female with medical history significant for advanced dementia, left hip fracture status post repair on 03/24/2020, hypertension, and thoracic aortic aneurysm, now presenting to the emergency department for evaluation of hypernatremia.  Patient has reportedly had not been eating or drinking much at her facility, had blood work performed today, was noted to be hyponatremic, and sent to the ED.  Patient is unable to provide history due to her advanced dementia but is at her baseline cognitively per report of facility personnel.  ED Course: Upon arrival to the ED, patient is found to be afebrile, saturating well on room air, and with stable blood pressure.  Chemistry panel is notable for sodium 162, potassium 3.3, and bilirubin 2.4.  CBC features a macrocytosis without anemia.  Patient was given a liter of saline in the ED.  Covid screening test not yet performed.  Review of Systems:  Unable to complete ROS secondary the patient's clinical condition.  Past Medical History:  Diagnosis Date  . Anxiety   . Benign fundic gland polyps of stomach   . Benign gastrointestinal stromal tumor (GIST)   . Bronchitis   . Cancer ([place])    recent skin cancer left leg  excised about 8 days ago-remains with dressing intact.   . Chronic cystitis   . COLONIC POLYPS, HX OF   . Complication of anesthesia   . Diverticulosis   . DYSLIPIDEMIA   . Gastritis   . GERD (gastroesophageal reflux disease)   . Hemorrhoids   . Hiatal hernia   . HYPERTENSION   . Macular degeneration    optho q51mo - [place]  . Meningioma ([place])   . MITRAL VALVE PROLAPSE   . NEPHROLITHIASIS   . OSTEOARTHRITIS   . OSTEOPOROSIS   . PONV (postoperative nausea and vomiting)   . Thoracic aortic aneurysm (H[facility])    4.1  cm 2015    Past Surgical History:  Procedure Laterality Date  . ABDOMINAL HYSTERECTOMY    . APPENDECTOMY    . BREAST SURGERY    . [place]   no PCI, performed in [place]  . Cataract surgery  2000  . CHOLECYSTECTOMY    . COLONOSCOPY    . ESOPHAGOGASTRODUODENOSCOPY    . EUS N/A 05/16/2014   Procedure: UPPER ENDOSCOPIC ULTRASOUND (EUS) LINEAR;  Surgeon: Milus Banister, MD;  Location: [facility];  Service: Endoscopy;  Laterality: N/A;  . INSERTION OF MESH  07/04/2012   Procedure: INSERTION OF MESH;  Surgeon: Odis Hollingshead, MD;  Location: [place];  Service: General;  Laterality: N/A;  . INTRAMEDULLARY (IM) NAIL INTERTROCHANTERIC Left 03/23/2020   Procedure: INTRAMEDULLARY (IM) NAIL INTERTROCHANTRIC HIP;  Surgeon: Wylene Simmer, MD;  Location: [place];  Service: Orthopedics;  Laterality: Left;  . KNEE SURGERY    . LEG SKIN LESION  BIOPSY / EXCISION Left    8 days ago pending pathology, remains with dressing.  . SURGERY OF LIP  in 2002  . TONSILLECTOMY    . VENTRAL HERNIA REPAIR  07/04/2012   Procedure: HERNIA REPAIR VENTRAL ADULT;  Surgeon: Odis Hollingshead, MD;  Location: [place];  Service: General;  Laterality: N/A;    Social History:   reports that she has never smoked. She has never used  smokeless tobacco. She reports current alcohol use of about 6.0 standard drinks of alcohol per week. She reports that she does not use drugs.  Allergies  Allergen Reactions  . Pentothal [Thiopental] Nausea And Vomiting  . Codeine   . Levofloxacin   . Oxycodone-Acetaminophen   . Sulfonamide Derivatives     REACTION: GI upset/nausea/vomiting  . Tape Rash    Per patient adhesive tape    Family History  Problem Relation Age of Onset  . Heart disease Other   . Heart disease Mother   . Heart disease Father   . Emphysema Father   . Heart attack Father   . Breast cancer Sister   . Lung cancer Sister   . Prostate cancer Son   . Prostate cancer Brother      Prior to  Admission medications   Medication Sig Start Date End Date Taking? Authorizing Provider  acetaminophen (TYLENOL) 500 MG tablet Take 1,000 mg by mouth every 8 (eight) hours as needed for moderate pain.    Yes [provider]  amLODipine (NORVASC) 2.5 MG tablet Take 2.5 mg by mouth daily.   Yes [provider]  ascorbic acid (VITAMIN C) 500 MG tablet Take 1 tablet (500 mg total) by mouth daily. 03/29/20  Yes Allie Bossier, MD  aspirin EC 81 MG tablet Take 1 tablet (81 mg total) by mouth 2 (two) times daily. Patient taking differently: Take 81 mg by mouth daily.  03/24/20  Yes Corky Sing, PA-C  bisacodyl (DULCOLAX) 5 MG EC tablet Take 10 mg by mouth daily as needed for moderate constipation.   Yes [provider]  docusate sodium (COLACE) 100 MG capsule Take 1 capsule (100 mg total) by mouth 2 (two) times daily. While taking narcotic pain medicine. 03/24/20  Yes Corky Sing, PA-C  esomeprazole (NEXIUM) 40 MG capsule Take 40 mg by mouth 2 (two) times daily before a meal.   Yes [provider]  ferrous sulfate 325 (65 FE) MG tablet Take 1 tablet (325 mg total) by mouth daily with breakfast. Patient taking differently: Take 325 mg by mouth. Once A Day on Mon, Wed, Fri 03/29/20  Yes Allie Bossier, MD  melatonin 5 MG TABS Take 5 mg by mouth at bedtime.    Yes [provider]  memantine (NAMENDA) 10 MG tablet Take 10 mg by mouth 2 (two) times daily.   Yes [provider]  mirtazapine (REMERON) 7.5 MG tablet Take 7.5 mg by mouth at bedtime.   Yes [provider]  ondansetron (ZOFRAN) 4 MG tablet Take 1 tablet (4 mg total) by mouth every 6 (six) hours as needed for nausea. 03/29/20  Yes Allie Bossier, MD  oxyCODONE (OXY IR/ROXICODONE) 5 MG immediate release tablet Take 5 mg by mouth every 4 (four) hours as needed for severe pain. 04/04/20 04/18/20 Yes [provider]  senna (SENOKOT) 8.6 MG TABS tablet Take 2 tablets (17.2 mg  total) by mouth 2 (two) times daily. 03/24/20  Yes Corky Sing, PA-C  simvastatin (ZOCOR) 10 MG tablet Take 5 mg by mouth daily.   Yes [provider]    Physical Exam: Vitals:   04/14/20 1940 04/14/20 1942 04/14/20 1944 04/14/20 2030  BP:  138/72  (!) 146/101  Pulse:  73  65  Resp:  20  17  Temp:  97.7 F (36.5 C)    TempSrc:  Oral    SpO2: 97% 96%  98%  Weight:  51.6 kg   Height:   5' (1.524 m)     Constitutional: NAD, calm  Eyes: PERTLA, lids and conjunctivae normal ENMT: Mucous membranes are dry. Posterior pharynx clear of any exudate or lesions.   Neck: normal, supple, no masses, no thyromegaly Respiratory: clear to auscultation bilaterally, no wheezing, no crackles. No accessory muscle use.  Cardiovascular: S1 & S2 heard, regular rate and rhythm. No extremity edema.   Abdomen: No distension, no tenderness, soft. Bowel sounds active.  Musculoskeletal: no clubbing / cyanosis. No joint deformity upper and lower extremities.   Skin: Poor turgor. Warm, dry, well-perfused. Neurologic: CN 2-12 grossly intact. Sensation intact. Moving all extremities.  Psychiatric: Alert, makes eye contact. Not answering basic questions. Calm.    Labs and Imaging on Admission: I have personally reviewed following labs and imaging studies  CBC: Recent Labs  Lab 04/14/20 1956  WBC 9.4  NEUTROABS 6.8  HGB 13.4  HCT 45.2  MCV 103.7*  PLT 409   Basic Metabolic Panel: Recent Labs  Lab 04/14/20 1956  NA 162*  K 3.3*  CL 124*  CO2 25  GLUCOSE 94  BUN 29*  CREATININE 0.48  CALCIUM 8.9   GFR: Estimated Creatinine Clearance: 32.9 mL/min (by C-G formula based on SCr of 0.48 mg/dL). Liver Function Tests: Recent Labs  Lab 04/14/20 1956  AST 20  ALT 12  ALKPHOS 176*  BILITOT 2.4*  PROT 6.2*  ALBUMIN 3.2*   No results for input(s): LIPASE, AMYLASE in the last 168 hours. No results for input(s): AMMONIA in the last 168 hours. Coagulation Profile: No results for  input(s): INR, PROTIME in the last 168 hours. Cardiac Enzymes: No results for input(s): CKTOTAL, CKMB, CKMBINDEX, TROPONINI in the last 168 hours. BNP (last 3 results) No results for input(s): PROBNP in the last 8760 hours. HbA1C: No results for input(s): HGBA1C in the last 72 hours. CBG: No results for input(s): GLUCAP in the last 168 hours. Lipid Profile: No results for input(s): CHOL, HDL, LDLCALC, TRIG, CHOLHDL, LDLDIRECT in the last 72 hours. Thyroid Function Tests: No results for input(s): TSH, T4TOTAL, FREET4, T3FREE, THYROIDAB in the last 72 hours. Anemia Panel: No results for input(s): VITAMINB12, FOLATE, FERRITIN, TIBC, IRON, RETICCTPCT in the last 72 hours. Urine analysis:    Component Value Date/Time   COLORURINE YELLOW 06/22/2018 1529   APPEARANCEUR SL CLOUDY (A) 06/22/2018 1529   LABSPEC 1.020 06/22/2018 1529   PHURINE 6.0 06/22/2018 1529   GLUCOSEU NEGATIVE 06/22/2018 1529   HGBUR SMALL (A) 06/22/2018 1529   BILIRUBINUR SMALL (A) 06/22/2018 1529   BILIRUBINUR negative 07/12/2014 0851   KETONESUR TRACE (A) 06/22/2018 1529   PROTEINUR 100 (A) 11/05/2017 0235   UROBILINOGEN 0.2 06/22/2018 1529   NITRITE NEGATIVE 06/22/2018 1529   LEUKOCYTESUR MODERATE (A) 06/22/2018 1529   Sepsis Labs: @LABRCNTIP (procalcitonin:4,lacticidven:4) )No results found for this or any previous visit (from the past 240 hour(s)).   Radiological Exams on Admission: No results found.   Assessment/Plan   1. Hypernatremia  - Serum sodium is 162 on admission in setting of hypovolemia, reportedly not eating or drinking much  - She was given a liter of normal saline in ED  - Continue IVF hydration with 0.45% saline, monitor sodium levels, consult dietary   2. Hypokalemia  - Serum potassium is 3.3 on admission  - KCl added to IVF and 10 mEq oral potassium ordered  - Repeat chemistries in am   3. Dementia  - Supportive care, Remeron, Namenda, delirium precautions  4.  Hyperbilirubinemia  - Total bilirubin is 2.4 on admission, increased from priors  - Abdominal exam is benign  - Check RUQ [place], repeat LFTs in am    5. Hypertension  - BP at goal, continue Norvasc     DVT prophylaxis: Lovenox   Code Status: DNR  Family Communication: Discussed with patient  Disposition Plan:  Patient is from: SNF  Anticipated d/c is to: SNF  Anticipated d/c date is: 8/10 or 04/16/20  Patient currently: Pending repeat chem panel  Consults called: None  Admission status: Observation     Vianne Bulls, MD [facility]  04/14/2020, 10:00 PM

## 2020-04-14 NOTE — Assessment & Plan Note (Signed)
04/11/20 X-ray abd: no acute abdominal pathology, multiple old  Continue Colace bid, prn Bisacodyl 10mg  daily, Senna II bid

## 2020-04-14 NOTE — Assessment & Plan Note (Signed)
Stable, continue Esomeprazole 40mg  qd, prn Zofran 4mg  q6hr

## 2020-04-14 NOTE — Assessment & Plan Note (Signed)
Post Op anemia, transfused 1 unit PRBC 03/27/20, takes Fe daily. Hgb 13.3 04/11/20, likely hemoconcentration.

## 2020-04-14 NOTE — ED Triage Notes (Signed)
Pt sent from [facility] for abnormal sodium on bloodwork today of 157, recent  L hip fracture. GCS 14 which is baseline per facility, pt c/o L hip pain

## 2020-04-14 NOTE — Assessment & Plan Note (Signed)
Blood pressure is controlled, continue Hydralazine

## 2020-04-14 NOTE — Assessment & Plan Note (Signed)
Advanced, continue  Memantine, Mirtazapine 7.5mg  qd, Quetiapine 12.5mg  daily prn

## 2020-04-14 NOTE — Assessment & Plan Note (Addendum)
04/11/20 neutrophils 84.7%, infection vs hemoconcentration, will do UA C/S

## 2020-04-14 NOTE — ED Notes (Signed)
Date and time results received: 04/14/20 8:42 PM  (use smartphrase ".now" to insert current time)  Test: Na Critical Value: 162  Name of Provider Notified: Dr.Zammit  Orders Received? Or Actions Taken?:

## 2020-04-14 NOTE — Assessment & Plan Note (Signed)
04/14/20 total bilirubin 1.7, direct bilirubin 0.7, alk phos 187, may consider US liver, gallbladder, pancrease.

## 2020-04-15 ENCOUNTER — Encounter: Payer: Self-pay | Admitting: Nurse Practitioner

## 2020-04-15 DIAGNOSIS — E876 Hypokalemia: Secondary | ICD-10-CM | POA: Diagnosis present

## 2020-04-15 DIAGNOSIS — Z825 Family history of asthma and other chronic lower respiratory diseases: Secondary | ICD-10-CM | POA: Diagnosis not present

## 2020-04-15 DIAGNOSIS — D7589 Other specified diseases of blood and blood-forming organs: Secondary | ICD-10-CM | POA: Diagnosis present

## 2020-04-15 DIAGNOSIS — Z7982 Long term (current) use of aspirin: Secondary | ICD-10-CM | POA: Diagnosis not present

## 2020-04-15 DIAGNOSIS — H9190 Unspecified hearing loss, unspecified ear: Secondary | ICD-10-CM | POA: Diagnosis present

## 2020-04-15 DIAGNOSIS — Z803 Family history of malignant neoplasm of breast: Secondary | ICD-10-CM | POA: Diagnosis not present

## 2020-04-15 DIAGNOSIS — E860 Dehydration: Secondary | ICD-10-CM | POA: Diagnosis present

## 2020-04-15 DIAGNOSIS — Z881 Allergy status to other antibiotic agents status: Secondary | ICD-10-CM | POA: Diagnosis not present

## 2020-04-15 DIAGNOSIS — F0390 Unspecified dementia without behavioral disturbance: Secondary | ICD-10-CM | POA: Diagnosis present

## 2020-04-15 DIAGNOSIS — Z801 Family history of malignant neoplasm of trachea, bronchus and lung: Secondary | ICD-10-CM | POA: Diagnosis not present

## 2020-04-15 DIAGNOSIS — E861 Hypovolemia: Secondary | ICD-10-CM | POA: Diagnosis present

## 2020-04-15 DIAGNOSIS — Z882 Allergy status to sulfonamides status: Secondary | ICD-10-CM | POA: Diagnosis not present

## 2020-04-15 DIAGNOSIS — E870 Hyperosmolality and hypernatremia: Secondary | ICD-10-CM | POA: Diagnosis present

## 2020-04-15 DIAGNOSIS — Z20822 Contact with and (suspected) exposure to covid-19: Secondary | ICD-10-CM | POA: Diagnosis present

## 2020-04-15 DIAGNOSIS — E785 Hyperlipidemia, unspecified: Secondary | ICD-10-CM | POA: Diagnosis present

## 2020-04-15 DIAGNOSIS — R17 Unspecified jaundice: Secondary | ICD-10-CM | POA: Diagnosis present

## 2020-04-15 DIAGNOSIS — I10 Essential (primary) hypertension: Secondary | ICD-10-CM | POA: Diagnosis present

## 2020-04-15 DIAGNOSIS — Z8249 Family history of ischemic heart disease and other diseases of the circulatory system: Secondary | ICD-10-CM | POA: Diagnosis not present

## 2020-04-15 DIAGNOSIS — Z66 Do not resuscitate: Secondary | ICD-10-CM | POA: Diagnosis present

## 2020-04-15 DIAGNOSIS — Z885 Allergy status to narcotic agent status: Secondary | ICD-10-CM | POA: Diagnosis not present

## 2020-04-15 DIAGNOSIS — I712 Thoracic aortic aneurysm, without rupture: Secondary | ICD-10-CM | POA: Diagnosis present

## 2020-04-15 DIAGNOSIS — H3530 Unspecified macular degeneration: Secondary | ICD-10-CM | POA: Diagnosis present

## 2020-04-15 DIAGNOSIS — K219 Gastro-esophageal reflux disease without esophagitis: Secondary | ICD-10-CM | POA: Diagnosis present

## 2020-04-15 DIAGNOSIS — Z85828 Personal history of other malignant neoplasm of skin: Secondary | ICD-10-CM | POA: Diagnosis not present

## 2020-04-15 LAB — BASIC METABOLIC PANEL
Anion gap: 11 (ref 5–15)
Anion gap: 11 (ref 5–15)
Anion gap: 9 (ref 5–15)
BUN: 25 mg/dL — ABNORMAL HIGH (ref 8–23)
BUN: 25 mg/dL — ABNORMAL HIGH (ref 8–23)
BUN: 22 mg/dL (ref 8–23)
CO2: 25 mmol/L (ref 22–32)
CO2: 26 mmol/L (ref 22–32)
CO2: 25 mmol/L (ref 22–32)
Calcium: 8.8 mg/dL — ABNORMAL LOW (ref 8.9–10.3)
Calcium: 8.7 mg/dL — ABNORMAL LOW (ref 8.9–10.3)
Calcium: 8.7 mg/dL — ABNORMAL LOW (ref 8.9–10.3)
Chloride: 127 mmol/L — ABNORMAL HIGH (ref 98–111)
Chloride: 125 mmol/L — ABNORMAL HIGH (ref 98–111)
Chloride: 125 mmol/L — ABNORMAL HIGH (ref 98–111)
Creatinine, Ser: 0.43 mg/dL — ABNORMAL LOW (ref 0.44–1.00)
Creatinine, Ser: 0.43 mg/dL — ABNORMAL LOW (ref 0.44–1.00)
Creatinine, Ser: 0.41 mg/dL — ABNORMAL LOW (ref 0.44–1.00)
GFR calc Af Amer: >60 mL/min (ref >60)
GFR calc Af Amer: >60 mL/min (ref >60)
GFR calc Af Amer: >60 mL/min (ref >60)
GFR calc non Af Amer: >60 mL/min (ref >60)
GFR calc non Af Amer: >60 mL/min (ref >60)
GFR calc non Af Amer: >60 mL/min (ref >60)
Glucose, Bld: 84 mg/dL (ref 70–99)
Glucose, Bld: 107 mg/dL — ABNORMAL HIGH (ref 70–99)
Glucose, Bld: 108 mg/dL — ABNORMAL HIGH (ref 70–99)
Potassium: 3.2 mmol/L — ABNORMAL LOW (ref 3.5–5.1)
Potassium: 3.4 mmol/L — ABNORMAL LOW (ref 3.5–5.1)
Potassium: 3.4 mmol/L — ABNORMAL LOW (ref 3.5–5.1)
Sodium: 163 mmol/L (ref 135–145)
Sodium: 162 mmol/L (ref 135–145)
Sodium: 159 mmol/L — ABNORMAL HIGH (ref 135–145)

## 2020-04-15 LAB — CBC
HCT: 42 % (ref 36.0–46.0)
Hemoglobin: 12.4 g/dL (ref 12.0–15.0)
MCH: 30.3 pg (ref 26.0–34.0)
MCHC: 29.5 g/dL — ABNORMAL LOW (ref 30.0–36.0)
MCV: 102.7 fL — ABNORMAL HIGH (ref 80.0–100.0)
Platelets: 142 10*3/uL — ABNORMAL LOW (ref 150–400)
RBC: 4.09 MIL/uL (ref 3.87–5.11)
RDW: 15.9 % — ABNORMAL HIGH (ref 11.5–15.5)
WBC: 7.9 10*3/uL (ref 4.0–10.5)
nRBC: 0.3 % — ABNORMAL HIGH (ref 0.0–0.2)

## 2020-04-15 LAB — TSH: TSH: 1.816 u[IU]/mL (ref 0.350–4.500)

## 2020-04-15 LAB — MAGNESIUM: Magnesium: 1.7 mg/dL (ref 1.7–2.4)

## 2020-04-15 MED ORDER — ADULT MULTIVITAMIN W/MINERALS CH
1.0000 | ORAL_TABLET | Freq: Every day | ORAL | Status: DC
  Filled 2020-04-15: qty 1

## 2020-04-15 MED ORDER — DEXTROSE 5 % IV SOLN: INTRAVENOUS | Status: DC

## 2020-04-15 MED ORDER — AMLODIPINE BESYLATE 5 MG PO TABS
2.5000 mg | ORAL_TABLET | Freq: Every day | ORAL | Status: DC
  Administered 2020-04-15: 2.5 mg via ORAL
  Filled 2020-04-15 (×2): qty 1

## 2020-04-15 MED ORDER — DOCUSATE SODIUM 100 MG PO CAPS
100.0000 mg | ORAL_CAPSULE | Freq: Two times a day (BID) | ORAL | Status: DC
  Administered 2020-04-15 (×2): 100 mg via ORAL
  Filled 2020-04-15 (×4): qty 1

## 2020-04-15 MED ORDER — ENSURE ENLIVE PO LIQD
237.0000 mL | Freq: Two times a day (BID) | ORAL | Status: DC
  Administered 2020-04-16: 237 mL via ORAL

## 2020-04-15 MED ORDER — MELATONIN 5 MG PO TABS
5.0000 mg | ORAL_TABLET | Freq: Every day | ORAL | Status: DC
  Administered 2020-04-15: 5 mg via ORAL
  Filled 2020-04-15 (×2): qty 1

## 2020-04-15 MED ORDER — POTASSIUM CHLORIDE 20 MEQ/15ML (10%) PO SOLN
40.0000 meq | ORAL | Status: AC
  Filled 2020-04-15 (×2): qty 30

## 2020-04-15 MED ORDER — ENOXAPARIN SODIUM 40 MG/0.4ML ~~LOC~~ SOLN
40.0000 mg | Freq: Every day | SUBCUTANEOUS | Status: DC
  Administered 2020-04-15 – 2020-04-16 (×3): 40 mg via SUBCUTANEOUS
  Filled 2020-04-15 (×3): qty 0.4

## 2020-04-15 MED ORDER — MIRTAZAPINE 15 MG PO TABS
7.5000 mg | ORAL_TABLET | Freq: Every day | ORAL | Status: DC
  Administered 2020-04-15: 7.5 mg via ORAL
  Filled 2020-04-15 (×2): qty 1

## 2020-04-15 MED ORDER — MEMANTINE HCL 10 MG PO TABS
10.0000 mg | ORAL_TABLET | Freq: Two times a day (BID) | ORAL | Status: DC
  Administered 2020-04-15 (×2): 10 mg via ORAL
  Filled 2020-04-15 (×3): qty 1

## 2020-04-15 MED ORDER — PANTOPRAZOLE SODIUM 40 MG PO TBEC
40.0000 mg | DELAYED_RELEASE_TABLET | Freq: Every day | ORAL | Status: DC
  Administered 2020-04-15: 40 mg via ORAL
  Filled 2020-04-15 (×2): qty 1

## 2020-04-15 MED ORDER — SENNA 8.6 MG PO TABS
2.0000 | ORAL_TABLET | Freq: Two times a day (BID) | ORAL | Status: DC
  Administered 2020-04-15 (×2): 17.2 mg via ORAL
  Filled 2020-04-15 (×3): qty 2

## 2020-04-15 MED ORDER — SIMVASTATIN 10 MG PO TABS
5.0000 mg | ORAL_TABLET | Freq: Every day | ORAL | Status: DC
  Administered 2020-04-15: 5 mg via ORAL
  Filled 2020-04-15 (×2): qty 1

## 2020-04-15 MED ORDER — ASPIRIN EC 81 MG PO TBEC
81.0000 mg | DELAYED_RELEASE_TABLET | Freq: Every day | ORAL | Status: DC
  Administered 2020-04-15: 81 mg via ORAL
  Filled 2020-04-15 (×2): qty 1

## 2020-04-15 NOTE — Progress Notes (Addendum)
Initial Nutrition Assessment  INTERVENTION:   -Ensure Enlive po BID, each supplement provides 350 kcal and 20 grams of protein -Multivitamin with minerals daily  NUTRITION DIAGNOSIS:   Inadequate oral intake related to  (advanced dementia) as evidenced by per patient/family report.  GOAL:   Patient will meet greater than or equal to 90% of their needs  MONITOR:   PO intake, Supplement acceptance, Labs, Weight trends, I & O's  REASON FOR ASSESSMENT:   Consult Assessment of nutrition requirement/status  ASSESSMENT:   86 y.o. female with medical history significant for advanced dementia, left hip fracture status post repair on 03/24/2020, hypertension, and thoracic aortic aneurysm, now presenting to the emergency department for evaluation of hypernatremia.  Patient has reportedly had not been eating or drinking much at her facility, had blood work performed today, was noted to be hyponatremic, and sent to the ED.  Patient is unable to provide history due to her advanced dementia but is at her baseline cognitively per report of facility personnel.  Patient with advanced dementia. Unable to provide any nutrition history.  Per chart review, pt has not been eating well PTA at her facility.  Pt refused breakfast this morning. RD will order Ensure supplements for additional kcals and protein.  Per weight records, pt has lost 8 lbs since 7/29 (6% wt loss x  2 weeks, significant for time frame).   Medications: Colace, Remeron, KCl, Senokot, D5 infusion  Labs reviewed: Elevated Na (163) Low K  NUTRITION - FOCUSED PHYSICAL EXAM:  Unable to complete  Diet Order:   Diet Order            Diet Heart Room service appropriate? Yes; Fluid consistency: Thin  Diet effective now                 EDUCATION NEEDS:   No education needs have been identified at this time  Skin:  Skin Assessment: Reviewed RN Assessment  Last BM:  8/10 -type 6  Height:   Ht Readings from Last 1  Encounters:  04/14/20 5' (1.524 m)    Weight:   Wt Readings from Last 1 Encounters:  04/15/20 51.7 kg   BMI:  Body mass index is 22.26 kg/m.  Estimated Nutritional Needs:   Kcal:  1500-1700  Protein:  75-85g  Fluid:  1.5L/day  Clayton Bibles, MS, RD, LDN Inpatient Clinical Dietitian Contact information available via Amion

## 2020-04-15 NOTE — Progress Notes (Signed)
PROGRESS NOTE    Chelsea Jordan  OZH:[id number] DOB: Nov 15, 1926 DOA: 04/14/2020 PCP: Virgie Dad, MD   Brief Narrative:  86 y.o. female with medical history significant for advanced dementia, left hip fracture status post repair on 03/24/2020, hypertension, and thoracic aortic aneurysm, now presenting to the emergency department for evaluation of hypernatremia. Poor appetite at her facility therefore routine lab work showed hyponatremia. Admission sodium 163, clinical signs of dehydration.   Assessment & Plan:   Principal Problem:   Hypernatremia Active Problems:   Dementia (HCC)   Elevated bilirubin   Hypokalemia  Severe hypernatremia secondary to hypovolemia -Change IV fluids to D5 water, 75 cc/h. -BMP every 6 hours, closely monitor sodium levels, urine electrolytes -Monitor urine output and supportive care. -Maintain periodic neurochecks  Hypokalemia -Oral repletion ordered.  Macrocytosis -Check TSH, B12, folate  History of dementia -Continue home Remeron, Namenda  Elevated total bilirubin -Total bilirubin on admission 2.4. Abdominal ultrasound-overall unremarkable, prior cholecystectomy  Essential hypertension -Continue Norvasc  GERD -PPI  Hyperlipidemia -Zocor  Encouraged family to have goals of care discussion.    DVT prophylaxis: Lovenox Code Status: DNR Family Communication:  [name] updated.   Status is: Inpatient  Dispo: The patient is from: ALF              Anticipated d/c is to: ALF              Anticipated d/c date is: 2 days              Patient currently is not medically stable to d/c. Maintain hospital stay as patient going to require D5 water until improvement in her sodium status. Expect at least 2-3 days.   Body mass index is 22.26 kg/m.   Subjective: Patient's baseline is confused, hearing loss. But follows very commands at times, which has worsened in recent times. Doesn't carry on conversation anymore.  Poor PO intake.   Review of  Systems Otherwise negative except as per HPI, including: Difficult to obtain given her mentation.   Examination:  Constitutional: Not in acute distress, unable to carry conversation. Appears chronically.  Respiratory: Clear to auscultation bilaterally Cardiovascular: Normal sinus rhythm, no rubs Abdomen: Nontender nondistended good bowel sounds Musculoskeletal: No edema noted Skin: No rashes seen Neurologic: unable to assess.  Psychiatric: unable to assess.   Objective: Vitals:   04/14/20 2322 04/15/20 0313 04/15/20 0500 04/15/20 0744  BP: (!) 152/82 (!) 146/73  (!) 170/75  Pulse: (!) 55 61  (!) 48  Resp: 18 17  17   Temp: 97.7 F (36.5 C) 97.9 F (36.6 C)  (!) 97.5 F (36.4 C)  TempSrc: Oral Oral  Axillary  SpO2: 97% 96%  97%  Weight:   51.7 kg   Height:        Intake/Output Summary (Last 24 hours) at 04/15/2020 0813 Last data filed at 04/15/2020 0500 Gross per 24 hour  Intake 1476.67 ml  Output --  Net 1476.67 ml   Filed Weights   04/14/20 1944 04/15/20 0500  Weight: 51.6 kg 51.7 kg     Data Reviewed:   CBC: Recent Labs  Lab 04/14/20 1956 04/15/20 0536  WBC 9.4 7.9  NEUTROABS 6.8  --   HGB 13.4 12.4  HCT 45.2 42.0  MCV 103.7* 102.7*  PLT 164 629*   Basic Metabolic Panel: Recent Labs  Lab 04/14/20 1956 04/15/20 0536  NA 162* 163*  K 3.3* 3.2*  CL 124* 127*  CO2 25 25  GLUCOSE 94 84  BUN 29* 25*  CREATININE 0.48 0.43*  CALCIUM 8.9 8.8*  MG  --  1.7   GFR: Estimated Creatinine Clearance: 32.9 mL/min (A) (by C-G formula based on SCr of 0.43 mg/dL (L)). Liver Function Tests: Recent Labs  Lab 04/14/20 1956  AST 20  ALT 12  ALKPHOS 176*  BILITOT 2.4*  PROT 6.2*  ALBUMIN 3.2*   No results for input(s): LIPASE, AMYLASE in the last 168 hours. No results for input(s): AMMONIA in the last 168 hours. Coagulation Profile: No results for input(s): INR, PROTIME in the last 168 hours. Cardiac Enzymes: No results for input(s): CKTOTAL, CKMB,  CKMBINDEX, TROPONINI in the last 168 hours. BNP (last 3 results) No results for input(s): PROBNP in the last 8760 hours. HbA1C: No results for input(s): HGBA1C in the last 72 hours. CBG: No results for input(s): GLUCAP in the last 168 hours. Lipid Profile: No results for input(s): CHOL, HDL, LDLCALC, TRIG, CHOLHDL, LDLDIRECT in the last 72 hours. Thyroid Function Tests: No results for input(s): TSH, T4TOTAL, FREET4, T3FREE, THYROIDAB in the last 72 hours. Anemia Panel: No results for input(s): VITAMINB12, FOLATE, FERRITIN, TIBC, IRON, RETICCTPCT in the last 72 hours. Sepsis Labs: No results for input(s): PROCALCITON, LATICACIDVEN in the last 168 hours.  Recent Results (from the past 240 hour(s))  SARS Coronavirus 2 by RT PCR (hospital order, performed in [facility] hospital lab) Nasopharyngeal Nasopharyngeal Swab     Status: None   Collection Time: 04/14/20 10:06 PM   Specimen: Nasopharyngeal Swab  Result Value Ref Range Status   SARS Coronavirus 2 NEGATIVE NEGATIVE Final    Comment: (NOTE) SARS-CoV-2 target nucleic acids are NOT DETECTED.  The SARS-CoV-2 RNA is generally detectable in upper and lower respiratory specimens during the acute phase of infection. The lowest concentration of SARS-CoV-2 viral copies this assay can detect is 250 copies / mL. A negative result does not preclude SARS-CoV-2 infection and should not be used as the sole basis for treatment or other patient management decisions.  A negative result may occur with improper specimen collection / handling, submission of specimen other than nasopharyngeal swab, presence of viral mutation(s) within the areas targeted by this assay, and inadequate number of viral copies (<250 copies / mL). A negative result must be combined with clinical observations, patient history, and epidemiological information.  Fact Sheet for Patients:   StrictlyIdeas.no  Fact Sheet for Healthcare  Providers: BankingDealers.co.za  This test is not yet approved or  cleared by the [place] FDA and has been authorized for detection and/or diagnosis of SARS-CoV-2 by FDA under an Emergency Use Authorization (EUA).  This EUA will remain in effect (meaning this test can be used) for the duration of the COVID-19 declaration under Section 564(b)(1) of the Act, 21 U.S.C. section 360bbb-3(b)(1), unless the authorization is terminated or revoked sooner.  Performed at [facility], [place] [address] [place], [place] [address]          Radiology Studies: US Abdomen Limited RUQ  Result Date: 04/14/2020 CLINICAL DATA:  86 year old female with hyperbilirubinemia. EXAM: ULTRASOUND ABDOMEN LIMITED RIGHT UPPER QUADRANT COMPARISON:  None. FINDINGS: Gallbladder: Cholecystectomy. Common bile duct: Diameter: 6 mm Liver: No focal lesion identified. Within normal limits in parenchymal echogenicity. Portal vein is patent on color Doppler imaging with normal direction of blood flow towards the liver. Other: None. IMPRESSION: Cholecystectomy, otherwise unremarkable right upper quadrant ultrasound. Electronically Signed   By: Anner Crete M.D.   On: 04/14/2020 22:39  Scheduled Meds: . amLODipine  2.5 mg Oral Daily  . aspirin EC  81 mg Oral Daily  . docusate sodium  100 mg Oral BID  . enoxaparin (LOVENOX) injection  40 mg Subcutaneous QHS  . melatonin  5 mg Oral QHS  . memantine  10 mg Oral BID  . mirtazapine  7.5 mg Oral QHS  . pantoprazole  40 mg Oral Daily  . potassium chloride  40 mEq Oral Q4H  . senna  2 tablet Oral BID  . simvastatin  5 mg Oral Daily   Continuous Infusions: . dextrose       LOS: 0 days   Time spent= 35 mins    Kamesha Herne Arsenio Loader, MD [facility]  If 7PM-7AM, please contact night-coverage  04/15/2020, 8:13 AM

## 2020-04-15 NOTE — Progress Notes (Signed)
CRITICAL VALUE ALERT  Critical Value:  Na 162 Date & Time Notied:  8/10 0637  Provider Notified: yes  Orders Received/Actions taken:pending

## 2020-04-15 NOTE — TOC Initial Note (Signed)
Transition of Care [facility]) - Initial/Assessment Note    Patient Details  Name: Chelsea Jordan MRN: [id number] Date of Birth: [birth year]  Transition of Care [facility]) CM/SW Contact:    Trish Mage, LCSW Phone Number: 04/15/2020, 5:04 PM  Clinical Narrative:     Patient to return to [facility] with support of hospice. Spoke with daughter in law Joanne Chars, [phone], who is also health care POA, to confirm plan.  She chooses [facility].  Referral made. TOC will continue to follow during the course of hospitalization.                Expected Discharge Plan: Home w Hospice Care Barriers to Discharge: No Barriers Identified   Patient Goals and CMS Choice        Expected Discharge Plan and Services Expected Discharge Plan: [place]                                              Prior Living Arrangements/Services                       Activities of Daily Living Home Assistive Devices/Equipment: None ADL Screening (condition at time of admission) Patient's cognitive ability adequate to safely complete daily activities?: Yes Is the patient deaf or have difficulty hearing?: No Does the patient have difficulty seeing, even when wearing glasses/contacts?: No Does the patient have difficulty concentrating, remembering, or making decisions?: Yes Patient able to express need for assistance with ADLs?: No Does the patient have difficulty dressing or bathing?: Yes Independently performs ADLs?: No Communication: Dependent Is this a change from baseline?: Pre-admission baseline Dressing (OT): Dependent Is this a change from baseline?: Pre-admission baseline Grooming: Dependent Is this a change from baseline?: Pre-admission baseline Feeding: Dependent Is this a change from baseline?: Pre-admission baseline Bathing: Dependent Is this a change from baseline?: Pre-admission baseline Toileting: Dependent Is this a change from baseline?: Pre-admission  baseline In/Out Bed: Dependent Is this a change from baseline?: Pre-admission baseline Walks in Home: Needs assistance Is this a change from baseline?: Pre-admission baseline Does the patient have difficulty walking or climbing stairs?: Yes Weakness of Legs: Both Weakness of Arms/Hands: Both  Permission Sought/Granted                  Emotional Assessment              Admission diagnosis:  Hyperbilirubinemia [E80.6] Hypernatremia [E87.0] Patient Active Problem List   Diagnosis Date Noted   Elevated bilirubin 04/14/2020   Hypernatremia 09/73/5329   Neutrophilic leukocytosis 92/42/6834   Hypokalemia 04/14/2020   Hyperbilirubinemia    Blood loss anemia 03/31/2020   Slow transit constipation 03/31/2020   Fall 03/25/2020   Hip fracture, left, closed, initial encounter ([place]) 03/23/2020   Hip fracture ([place]) 03/23/2020   Insomnia 01/21/2020   Weight loss 05/30/2019   Dizziness 02/28/2019   Dementia ([place]) 06/22/2018   Olecranon fracture, left, closed, initial encounter 11/05/2017   Preventative health care 07/15/2016   Lightheadedness 12/25/2014   Aortic stenosis 12/20/2014   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   Medicare annual wellness visit, subsequent 07/12/2014   Thoracic aortic aneurysm ([place]) 07/08/2014   Recurrent UTI 03/12/2014   Eustachian tube dysfunction 08/16/2013   Macular degeneration    Mitral valve disorder  11/17/2009   Hyperlipidemia 09/24/2009   Essential hypertension 09/24/2009   Osteoporosis 07/15/2009   MENINGIOMA 10/20/2007   RHINOSINUSITIS, 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

## 2020-04-15 NOTE — Progress Notes (Addendum)
Hydrologist [facility]) Hospital Liaison: RN note    Notified by Transition of Care Manger of patient/family request for [facility] services at home after discharge. Chart and patient information under review by [facility] physician. Hospice eligibility approved.    Writer spoke with daughter in law [name]    to initiate education related to hospice philosophy, services and team approach to care. [name]  verbalized understanding of information given. Per discussion, plan is for [facility] once medically stable.   Please send signed and completed DNR form home with patient/family. Patient will need prescriptions for discharge comfort medications.     DME needs have been discussed, patient currently has the following equipment in the home: walker.  Patient/family requests the following DME for delivery to the home: none Home address has been verified and is correct in the chart.   [facility] aware of the above. Please notify [facility] when patient is ready to leave the unit at discharge. (Call [phone] or [phone] after 5pm.) [facility] information and contact numbers given to [facility].      Please call with any hospice related questions.     Thank you for this referral.     Clementeen Hoof, RN, [facility] (listed on [place] under [facility]ospice and [place] [facility] [place])  [phone]

## 2020-04-16 LAB — BASIC METABOLIC PANEL
Anion gap: 8 (ref 5–15)
Anion gap: 9 (ref 5–15)
Anion gap: 10 (ref 5–15)
BUN: 21 mg/dL (ref 8–23)
BUN: 19 mg/dL (ref 8–23)
BUN: 18 mg/dL (ref 8–23)
CO2: 26 mmol/L (ref 22–32)
CO2: 28 mmol/L (ref 22–32)
CO2: 27 mmol/L (ref 22–32)
Calcium: 8.5 mg/dL — ABNORMAL LOW (ref 8.9–10.3)
Calcium: 8.8 mg/dL — ABNORMAL LOW (ref 8.9–10.3)
Calcium: 8.8 mg/dL — ABNORMAL LOW (ref 8.9–10.3)
Chloride: 122 mmol/L — ABNORMAL HIGH (ref 98–111)
Chloride: 117 mmol/L — ABNORMAL HIGH (ref 98–111)
Chloride: 112 mmol/L — ABNORMAL HIGH (ref 98–111)
Creatinine, Ser: 0.43 mg/dL — ABNORMAL LOW (ref 0.44–1.00)
Creatinine, Ser: 0.4 mg/dL — ABNORMAL LOW (ref 0.44–1.00)
Creatinine, Ser: 0.45 mg/dL (ref 0.44–1.00)
GFR calc Af Amer: >60 mL/min (ref >60)
GFR calc Af Amer: >60 mL/min (ref >60)
GFR calc Af Amer: >60 mL/min (ref >60)
GFR calc non Af Amer: >60 mL/min (ref >60)
GFR calc non Af Amer: >60 mL/min (ref >60)
GFR calc non Af Amer: >60 mL/min (ref >60)
Glucose, Bld: 128 mg/dL — ABNORMAL HIGH (ref 70–99)
Glucose, Bld: 133 mg/dL — ABNORMAL HIGH (ref 70–99)
Glucose, Bld: 148 mg/dL — ABNORMAL HIGH (ref 70–99)
Potassium: 2.9 mmol/L — ABNORMAL LOW (ref 3.5–5.1)
Potassium: 2.8 mmol/L — ABNORMAL LOW (ref 3.5–5.1)
Potassium: 2.9 mmol/L — ABNORMAL LOW (ref 3.5–5.1)
Sodium: 156 mmol/L — ABNORMAL HIGH (ref 135–145)
Sodium: 154 mmol/L — ABNORMAL HIGH (ref 135–145)
Sodium: 149 mmol/L — ABNORMAL HIGH (ref 135–145)

## 2020-04-16 LAB — CBC
HCT: 42.3 % (ref 36.0–46.0)
Hemoglobin: 12.7 g/dL (ref 12.0–15.0)
MCH: 30.2 pg (ref 26.0–34.0)
MCHC: 30 g/dL (ref 30.0–36.0)
MCV: 100.7 fL — ABNORMAL HIGH (ref 80.0–100.0)
Platelets: 144 10*3/uL — ABNORMAL LOW (ref 150–400)
RBC: 4.2 MIL/uL (ref 3.87–5.11)
RDW: 15 % (ref 11.5–15.5)
WBC: 7.4 10*3/uL (ref 4.0–10.5)
nRBC: 0 % (ref 0.0–0.2)

## 2020-04-16 LAB — VITAMIN B12: Vitamin B-12: 457 pg/mL (ref 180–914)

## 2020-04-16 LAB — MAGNESIUM: Magnesium: 1.7 mg/dL (ref 1.7–2.4)

## 2020-04-16 MED ORDER — POTASSIUM CHLORIDE 20 MEQ PO PACK
40.0000 meq | PACK | Freq: Every day | ORAL | Status: DC
  Administered 2020-04-16: 40 meq via ORAL
  Filled 2020-04-16: qty 2

## 2020-04-16 MED ORDER — POTASSIUM CHLORIDE 10 MEQ/100ML IV SOLN
10.0000 meq | INTRAVENOUS | Status: AC
  Administered 2020-04-16 (×4): 10 meq via INTRAVENOUS
  Filled 2020-04-16 (×4): qty 100

## 2020-04-16 NOTE — Progress Notes (Signed)
PROGRESS NOTE    Chelsea Jordan  EHU:[id number] DOB: 01-06-1927 DOA: 04/14/2020 PCP: Virgie Dad, MD   Brief Narrative:  86 y.o. female with medical history significant for advanced dementia, left hip fracture status post repair on 03/24/2020, hypertension, and thoracic aortic aneurysm, now presenting to the emergency department for evaluation of hypernatremia. Poor appetite at her facility therefore routine lab work showed hyponatremia. Admission sodium 163, clinical signs of dehydration.   Assessment & Plan:   Principal Problem:   Hypernatremia Active Problems:   Dementia (HCC)   Elevated bilirubin   Hypokalemia  Severe hypernatremia secondary to hypovolemia, Na improving 154 -Continue IV fluids to D5 water, 75 cc/h. -Sodium levels improving at correct rate.  Continue periodic lab work and neurochecks. -Family does not want any aggressive treatment at this time.  Hypokalemia -oral and IV repletion ordered  Macrocytosis -TSH/B12 = WNL  History of dementia -Continue home Remeron, Namenda  Elevated total bilirubin -Total bilirubin on admission 2.4. Abdominal ultrasound-overall unremarkable, prior cholecystectomy  Essential hypertension -Continue Norvasc  GERD -PPI  Hyperlipidemia -Zocor  Spoke with family regarding goals of care, no escalation of care, continue IV fluids.  The already in touch with hospice team   DVT prophylaxis: Lovenox Code Status: DNR Family Communication: [name] updated  Status is: Inpatient  Dispo: The patient is from: ALF              Anticipated d/c is to: ALF              Anticipated d/c date is: 1 day              Patient currently is not medically stable to d/c.  Maintain hospital stay for another day of IV fluids, she is not taking anything p.o. therefore needs IV to help correct her sodium levels.  Body mass index is 23.29 kg/m.   Subjective: Continues to have poor oral intake, she is sleeping this morning during my visitation.   At baseline does not carry on any conversation.  Review of Systems Otherwise negative except as per HPI, including: Difficult to obtain given her mentation.   Examination:  Constitutional: Appears chronically ill, not in acute distress unable to carry on any conversation Respiratory: Anterior breath sounds are clear to auscultation bilaterally Cardiovascular: Normal sinus rhythm Abdomen: Positive bowel sounds nontender nondistended Musculoskeletal: No edema noted Skin: No rashes seen Neurologic: Unable to assess fully Psychiatric: Unable to fully assess  Objective: Vitals:   04/15/20 1356 04/15/20 1933 04/16/20 0331 04/16/20 0439  BP: (!) 155/72 (!) 153/80 (!) 146/63   Pulse: (!) 44 (!) 43 (!) 46   Resp: 20 15 18    Temp: 97.9 F (36.6 C) 97.8 F (36.6 C) 97.9 F (36.6 C)   TempSrc: Oral Oral Oral   SpO2: 97% 95% 96%   Weight:    54.1 kg  Height:        Intake/Output Summary (Last 24 hours) at 04/16/2020 1056 Last data filed at 04/16/2020 0750 Gross per 24 hour  Intake 546.45 ml  Output 550 ml  Net -3.55 ml   Filed Weights   04/14/20 1944 04/15/20 0500 04/16/20 0439  Weight: 51.6 kg 51.7 kg 54.1 kg     Data Reviewed:   CBC: Recent Labs  Lab 04/14/20 1956 04/15/20 0536 04/16/20 0725  WBC 9.4 7.9 7.4  NEUTROABS 6.8  --   --   HGB 13.4 12.4 12.7  HCT 45.2 42.0 42.3  MCV 103.7* 102.7* 100.7*  PLT  164 142* 867*   Basic Metabolic Panel: Recent Labs  Lab 04/15/20 0536 04/15/20 1217 04/15/20 1701 04/16/20 0044 04/16/20 0725  NA 163* 162* 159* 156* 154*  K 3.2* 3.4* 3.4* 2.9* 2.8*  CL 127* 125* 125* 122* 117*  CO2 25 26 25 26 28   GLUCOSE 84 107* 108* 128* 133*  BUN 25* 25* 22 21 19   CREATININE 0.43* 0.43* 0.41* 0.43* 0.40*  CALCIUM 8.8* 8.7* 8.7* 8.5* 8.8*  MG 1.7  --   --   --  1.7   GFR: Estimated Creatinine Clearance: 32.9 mL/min (A) (by C-G formula based on SCr of 0.4 mg/dL (L)). Liver Function Tests: Recent Labs  Lab 04/14/20 1956  AST 20    ALT 12  ALKPHOS 176*  BILITOT 2.4*  PROT 6.2*  ALBUMIN 3.2*   No results for input(s): LIPASE, AMYLASE in the last 168 hours. No results for input(s): AMMONIA in the last 168 hours. Coagulation Profile: No results for input(s): INR, PROTIME in the last 168 hours. Cardiac Enzymes: No results for input(s): CKTOTAL, CKMB, CKMBINDEX, TROPONINI in the last 168 hours. BNP (last 3 results) No results for input(s): PROBNP in the last 8760 hours. HbA1C: No results for input(s): HGBA1C in the last 72 hours. CBG: No results for input(s): GLUCAP in the last 168 hours. Lipid Profile: No results for input(s): CHOL, HDL, LDLCALC, TRIG, CHOLHDL, LDLDIRECT in the last 72 hours. Thyroid Function Tests: Recent Labs    04/15/20 1217  TSH 1.816   Anemia Panel: Recent Labs    04/16/20 0725  VITAMINB12 457   Sepsis Labs: No results for input(s): PROCALCITON, LATICACIDVEN in the last 168 hours.  Recent Results (from the past 240 hour(s))  SARS Coronavirus 2 by RT PCR (hospital order, performed in [facility] hospital lab) Nasopharyngeal Nasopharyngeal Swab     Status: None   Collection Time: 04/14/20 10:06 PM   Specimen: Nasopharyngeal Swab  Result Value Ref Range Status   SARS Coronavirus 2 NEGATIVE NEGATIVE Final    Comment: (NOTE) SARS-CoV-2 target nucleic acids are NOT DETECTED.  The SARS-CoV-2 RNA is generally detectable in upper and lower respiratory specimens during the acute phase of infection. The lowest concentration of SARS-CoV-2 viral copies this assay can detect is 250 copies / mL. A negative result does not preclude SARS-CoV-2 infection and should not be used as the sole basis for treatment or other patient management decisions.  A negative result may occur with improper specimen collection / handling, submission of specimen other than nasopharyngeal swab, presence of viral mutation(s) within the areas targeted by this assay, and inadequate number of viral copies (<250  copies / mL). A negative result must be combined with clinical observations, patient history, and epidemiological information.  Fact Sheet for Patients:   StrictlyIdeas.no  Fact Sheet for Healthcare Providers: BankingDealers.co.za  This test is not yet approved or  cleared by the [place] FDA and has been authorized for detection and/or diagnosis of SARS-CoV-2 by FDA under an Emergency Use Authorization (EUA).  This EUA will remain in effect (meaning this test can be used) for the duration of the COVID-19 declaration under Section 564(b)(1) of the Act, 21 U.S.C. section 360bbb-3(b)(1), unless the authorization is terminated or revoked sooner.  Performed at [facility], [place] [address] [place], [place] [address]          Radiology Studies: US Abdomen Limited RUQ  Result Date: 04/14/2020 CLINICAL DATA:  86 year old female with hyperbilirubinemia. EXAM: ULTRASOUND ABDOMEN LIMITED RIGHT  UPPER QUADRANT COMPARISON:  None. FINDINGS: Gallbladder: Cholecystectomy. Common bile duct: Diameter: 6 mm Liver: No focal lesion identified. Within normal limits in parenchymal echogenicity. Portal vein is patent on color Doppler imaging with normal direction of blood flow towards the liver. Other: None. IMPRESSION: Cholecystectomy, otherwise unremarkable right upper quadrant ultrasound. Electronically Signed   By: Anner Crete M.D.   On: 04/14/2020 22:39        Scheduled Meds: . amLODipine  2.5 mg Oral Daily  . aspirin EC  81 mg Oral Daily  . docusate sodium  100 mg Oral BID  . enoxaparin (LOVENOX) injection  40 mg Subcutaneous QHS  . feeding supplement (ENSURE ENLIVE)  237 mL Oral BID BM  . melatonin  5 mg Oral QHS  . memantine  10 mg Oral BID  . mirtazapine  7.5 mg Oral QHS  . multivitamin with minerals  1 tablet Oral Daily  . pantoprazole  40 mg Oral Daily  . potassium chloride  40 mEq Oral Daily  . senna  2  tablet Oral BID  . simvastatin  5 mg Oral Daily   Continuous Infusions: . dextrose 75 mL/hr at 04/15/20 2245  . potassium chloride 10 mEq (04/16/20 0910)     LOS: 1 day   Time spent= 35 mins    Brigida Scotti Arsenio Loader, MD [facility]  If 7PM-7AM, please contact night-coverage  04/16/2020, 10:56 AM

## 2020-04-17 LAB — BASIC METABOLIC PANEL
Anion gap: 7 (ref 5–15)
Anion gap: 5 (ref 5–15)
BUN: 16 mg/dL (ref 8–23)
BUN: 14 mg/dL (ref 8–23)
CO2: 24 mmol/L (ref 22–32)
CO2: 26 mmol/L (ref 22–32)
Calcium: 8.5 mg/dL — ABNORMAL LOW (ref 8.9–10.3)
Calcium: 8.6 mg/dL — ABNORMAL LOW (ref 8.9–10.3)
Chloride: 111 mmol/L (ref 98–111)
Chloride: 108 mmol/L (ref 98–111)
Creatinine, Ser: 0.47 mg/dL (ref 0.44–1.00)
Creatinine, Ser: 0.57 mg/dL (ref 0.44–1.00)
GFR calc Af Amer: >60 mL/min (ref >60)
GFR calc Af Amer: >60 mL/min (ref >60)
GFR calc non Af Amer: >60 mL/min (ref >60)
GFR calc non Af Amer: >60 mL/min (ref >60)
Glucose, Bld: 119 mg/dL — ABNORMAL HIGH (ref 70–99)
Glucose, Bld: 119 mg/dL — ABNORMAL HIGH (ref 70–99)
Potassium: 3.4 mmol/L — ABNORMAL LOW (ref 3.5–5.1)
Potassium: 4.9 mmol/L (ref 3.5–5.1)
Sodium: 142 mmol/L (ref 135–145)
Sodium: 139 mmol/L (ref 135–145)

## 2020-04-17 LAB — MAGNESIUM: Magnesium: 1.6 mg/dL — ABNORMAL LOW (ref 1.7–2.4)

## 2020-04-17 LAB — CBC
HCT: 40.1 % (ref 36.0–46.0)
Hemoglobin: 12.3 g/dL (ref 12.0–15.0)
MCH: 30.7 pg (ref 26.0–34.0)
MCHC: 30.7 g/dL (ref 30.0–36.0)
MCV: 100 fL (ref 80.0–100.0)
Platelets: 141 10*3/uL — ABNORMAL LOW (ref 150–400)
RBC: 4.01 MIL/uL (ref 3.87–5.11)
RDW: 14.7 % (ref 11.5–15.5)
WBC: 8.6 10*3/uL (ref 4.0–10.5)
nRBC: 0 % (ref 0.0–0.2)

## 2020-04-17 LAB — FOLATE RBC
Folate, Hemolysate: 382 ng/mL
Folate, RBC: 965 ng/mL (ref >498)
Hematocrit: 39.6 % (ref 34.0–46.6)

## 2020-04-17 NOTE — Discharge Summary (Addendum)
Physician Discharge Summary  CHANCE KARAM RCV:[id number] DOB: 1926-09-07 DOA: 04/14/2020  PCP: Virgie Dad, MD  Admit date: 04/14/2020 Discharge date: 04/17/2020  Admitted From: SNF Disposition: SNF  Recommendations for Outpatient Follow-up:  1. Follow up with PCP in 1-2 weeks 2. Please obtain BMP/CBC in one week your next doctors visit.  3. Discontinue aspirin, simvastatin and Norvasc 4. Supportive care, outpatient hospitalist to follow her 5. She will be returning with hospice through [facility]    Discharge Condition: Stable CODE STATUS: DNR Diet recommendation: As tolerated  Brief/Interim Summary: 86 y.o.femalewith medical history significant foradvanced dementia, left hip fracture status post repair on 03/24/2020, hypertension, and thoracic aortic aneurysm, now presenting to the emergency department for evaluation of hypernatremia. Poor appetite at her facility therefore routine lab work showed hyponatremia. Admission sodium 163, clinical signs of dehydration.  Over the course of 48 hours her sodium level slowly trended down, on the day of discharge it was 139.  After extensive discussion with patient's family member, she was transitioned to hospice and have them follow-up as outpatient.   Assessment & Plan:   Principal Problem:   Hypernatremia Active Problems:   Dementia (HCC)   Elevated bilirubin   Hypokalemia  Severe hypernatremia secondary to hypovolemia, -Hypernatremia has resolved with IV fluids, over 48 hours her sodium levels have trended down from 163 to 139.  Still continues to have poor oral intake but this is her baseline.  After extensive goals of care discussion, she has been transitioned to hospice.  Macrocytosis -TSH/B12 = WNL  History of dementia -Continue home Remeron, Namenda  Elevated total bilirubin -Total bilirubin on admission 2.4. Abdominal ultrasound-overall unremarkable, prior cholecystectomy  Essential hypertension -Discontinue  Norvasc  GERD -PPI  Hyperlipidemia -Discontinue Zocor  After having extensive discussion with family member it was decided to transition patient to outpatient SNF with hospice team to follow her.  She continues to have poor oral intake but this is her baseline due to her advanced dementia   Body mass index is 24.8 kg/m.     Discharge Diagnoses:  Principal Problem:   Hypernatremia Active Problems:   Dementia (HCC)   Elevated bilirubin   Hypokalemia Subjective: Laying in the bed, no complaints minimal interaction  Discharge Exam: Vitals:   04/16/20 1917 04/17/20 0349  BP: 127/74 (!) 117/91  Pulse: (!) 50 (!) 46  Resp: 18 16  Temp: 97.9 F (36.6 C) 97.7 F (36.5 C)  SpO2: (!) 89% (!) 83%   Vitals:   04/16/20 1323 04/16/20 1917 04/17/20 0349 04/17/20 0500  BP: (!) 171/87 127/74 (!) 117/91   Pulse: (!) 46 (!) 50 (!) 46   Resp: 14 18 16    Temp: 98.4 F (36.9 C) 97.9 F (36.6 C) 97.7 F (36.5 C)   TempSrc: Axillary Oral Oral   SpO2: 96% (!) 89% (!) 83%   Weight:    57.6 kg  Height:        General: Sleeping, not in acute distress, appears chronically ill. Cardiovascular: RRR, S1/S2 +, no rubs, no gallops Respiratory: CTA bilaterally, no wheezing, no rhonchi Abdominal: Soft, NT, ND, bowel sounds + Extremities: no edema, no cyanosis Difficult to assess her full neuro exam but does have advanced dementia.  Discharge Instructions   Allergies as of 04/17/2020      Reactions   Pentothal [thiopental] Nausea And Vomiting   Codeine    Levofloxacin    Oxycodone-acetaminophen    Sulfonamide Derivatives    REACTION: GI upset/nausea/vomiting   Tape  Rash   Per patient adhesive tape      Medication List    STOP taking these medications   amLODipine 2.5 MG tablet Commonly known as: NORVASC   aspirin EC 81 MG tablet   oxyCODONE 5 MG immediate release tablet Commonly known as: Oxy IR/ROXICODONE   simvastatin 10 MG tablet Commonly known as: ZOCOR      TAKE these medications   acetaminophen 500 MG tablet Commonly known as: TYLENOL Take 1,000 mg by mouth every 8 (eight) hours as needed for moderate pain.   ascorbic acid 500 MG tablet Commonly known as: VITAMIN C Take 1 tablet (500 mg total) by mouth daily.   bisacodyl 5 MG EC tablet Commonly known as: DULCOLAX Take 10 mg by mouth daily as needed for moderate constipation.   docusate sodium 100 MG capsule Commonly known as: Colace Take 1 capsule (100 mg total) by mouth 2 (two) times daily. While taking narcotic pain medicine.   esomeprazole 40 MG capsule Commonly known as: NEXIUM Take 40 mg by mouth 2 (two) times daily before a meal.   ferrous sulfate 325 (65 FE) MG tablet Take 1 tablet (325 mg total) by mouth daily with breakfast. What changed:   when to take this  additional instructions   melatonin 5 MG Tabs Take 5 mg by mouth at bedtime.   memantine 10 MG tablet Commonly known as: NAMENDA Take 10 mg by mouth 2 (two) times daily.   mirtazapine 7.5 MG tablet Commonly known as: REMERON Take 7.5 mg by mouth at bedtime.   ondansetron 4 MG tablet Commonly known as: ZOFRAN Take 1 tablet (4 mg total) by mouth every 6 (six) hours as needed for nausea.   senna 8.6 MG Tabs tablet Commonly known as: SENOKOT Take 2 tablets (17.2 mg total) by mouth 2 (two) times daily.       Follow-up Information    Virgie Dad, MD. Schedule an appointment as soon as possible for a visit in 1 week(s).   Specialty: Internal Medicine Contact information: [place] [phone]              Allergies  Allergen Reactions  . Pentothal [Thiopental] Nausea And Vomiting  . Codeine   . Levofloxacin   . Oxycodone-Acetaminophen   . Sulfonamide Derivatives     REACTION: GI upset/nausea/vomiting  . Tape Rash    Per patient adhesive tape    You were cared for by a hospitalist during your hospital stay. If you have any questions about your  discharge medications or the care you received while you were in the hospital after you are discharged, you can call the unit and asked to speak with the hospitalist on call if the hospitalist that took care of you is not available. Once you are discharged, your primary care physician will handle any further medical issues. Please note that no refills for any discharge medications will be authorized once you are discharged, as it is imperative that you return to your primary care physician (or establish a relationship with a primary care physician if you do not have one) for your aftercare needs so that they can reassess your need for medications and monitor your lab values.   Procedures/Studies: DG Chest 1 View  Result Date: 03/23/2020 CLINICAL DATA:  [place] this morning. EXAM: CHEST  1 VIEW COMPARISON:  04/17/2015 FINDINGS: Interval enlargement of the cardiac silhouette with increased prominence of the pulmonary vasculature and interstitial markings. Diffuse osteopenia is  again demonstrated as well as mild moderate thoracolumbar scoliosis and degenerative changes. IMPRESSION: Interval cardiomegaly and mild changes of congestive heart failure. Electronically Signed   By: Claudie Revering M.D.   On: 03/23/2020 12:29   DG C-Arm 1-60 Min  Result Date: 03/23/2020 CLINICAL DATA:  Left IM nail placement. EXAM: DG C-ARM 1-60 MIN; LEFT FEMUR 2 VIEWS FLUOROSCOPY TIME:  Fluoroscopy Time:  46 seconds COMPARISON:  March 23, 2020 FINDINGS: Intraoperative fluoroscopic images from IM nail and screw fixation of left sub trochanteric femoral fracture fixation demonstrate placement of orthopedic hardware with normal alignment and near anatomic osseous alignment. IMPRESSION: Intraoperative fluoroscopic images from IM nail and screw fixation of left sub trochanteric femoral fracture fixation. Electronically Signed   By: Fidela Salisbury M.D.   On: 03/23/2020 19:05   ECHOCARDIOGRAM COMPLETE  Result Date: 03/24/2020     ECHOCARDIOGRAM REPORT   Patient Name:   ANISHA STARLIPER Date of Exam: 03/24/2020 Medical Rec #:  [id number]     Height:       60.0 in Accession #:    [id number]    Weight:       131.0 lb Date of Birth:  [birth year]      BSA:          1.559 m Patient Age:    86 years      BP:           105/56 mmHg Patient Gender: F             HR:           59 bpm. Exam Location:  Inpatient Procedure: 2D Echo, Color Doppler, Cardiac Doppler and 3D Echo Indications:    R01.1 Murmur  History:        Patient has prior history of Echocardiogram examinations, most                 recent 03/16/2017. Risk Factors:Hypertension and Dyslipidemia.  Sonographer:    Raquel Sarna Senior RDCS Referring Phys: Marietta HEWITT IMPRESSIONS  1. Left ventricular ejection fraction, by estimation, is 55 to 60%. The left ventricle has normal function. The left ventricle has no regional wall motion abnormalities. Left ventricular diastolic function could not be evaluated.  2. Right ventricular systolic function is normal. The right ventricular size is normal. There is mildly elevated pulmonary artery systolic pressure.  3. Tracings suggest atrial flutter with variable conduction, recommend clinical correlation.. Left atrial size was severely dilated.  4. Right atrial size was moderately dilated.  5. The mitral valve is normal in structure. Mild mitral valve regurgitation. No evidence of mitral stenosis.  6. Tricuspid valve regurgitation is mild to moderate.  7. The aortic valve is tricuspid. Aortic valve regurgitation is mild. Mild aortic valve sclerosis is present, with no evidence of aortic valve stenosis.  8. Aortic dilatation noted. There is moderate dilatation of the ascending aorta measuring 45 mm.  9. The inferior vena cava is dilated in size with >50% respiratory variability, suggesting right atrial pressure of 8 mmHg. FINDINGS  Left Ventricle: Left ventricular ejection fraction, by estimation, is 55 to 60%. The left ventricle has normal function. The left  ventricle has no regional wall motion abnormalities. The left ventricular internal cavity size was normal in size. There is  no left ventricular hypertrophy. Left ventricular diastolic function could not be evaluated due to atrial fibrillation. Left ventricular diastolic function could not be evaluated. Right Ventricle: The right ventricular size is normal. No increase in right ventricular wall  thickness. Right ventricular systolic function is normal. There is mildly elevated pulmonary artery systolic pressure. The tricuspid regurgitant velocity is 2.77  m/s, and with an assumed right atrial pressure of 8 mmHg, the estimated right ventricular systolic pressure is 19.3 mmHg. Left Atrium: Tracings suggest atrial flutter with variable conduction, recommend clinical correlation. Left atrial size was severely dilated. Right Atrium: Right atrial size was moderately dilated. Pericardium: There is no evidence of pericardial effusion. Mitral Valve: The mitral valve is normal in structure. There is mild thickening of the mitral valve leaflet(s). There is mild calcification of the mitral valve leaflet(s). Mild mitral annular calcification. Mild mitral valve regurgitation. No evidence of  mitral valve stenosis. Tricuspid Valve: The tricuspid valve is normal in structure. Tricuspid valve regurgitation is mild to moderate. No evidence of tricuspid stenosis. Aortic Valve: The aortic valve is tricuspid. Aortic valve regurgitation is mild. Aortic regurgitation PHT measures 989 msec. Mild aortic valve sclerosis is present, with no evidence of aortic valve stenosis. There is mild calcification of the aortic valve. Aortic valve mean gradient measures 9.0 mmHg. Aortic valve peak gradient measures 19.2 mmHg. Aortic valve area, by VTI measures 1.73 cm. Pulmonic Valve: The pulmonic valve was not well visualized. Pulmonic valve regurgitation is not visualized. Aorta: Aortic dilatation noted. There is moderate dilatation of the ascending  aorta measuring 45 mm. Venous: The inferior vena cava is dilated in size with greater than 50% respiratory variability, suggesting right atrial pressure of 8 mmHg. IAS/Shunts: The atrial septum is grossly normal.  LEFT VENTRICLE PLAX 2D LVIDd:         3.50 cm  Diastology LVIDs:         2.70 cm  LV e' lateral:   6.64 cm/s LV PW:         0.90 cm  LV E/e' lateral: 15.5 LV IVS:        1.10 cm  LV e' medial:    6.85 cm/s LVOT diam:     2.00 cm  LV E/e' medial:  15.0 LV SV:         67 LV SV Index:   43 LVOT Area:     3.14 cm  RIGHT VENTRICLE RV S prime:     14.40 cm/s TAPSE (M-mode): 1.8 cm LEFT ATRIUM             Index       RIGHT ATRIUM           Index LA diam:        3.70 cm 2.37 cm/m  RA Area:     23.90 cm LA Vol (A2C):   92.8 ml 59.52 ml/m RA Volume:   70.00 ml  44.90 ml/m LA Vol (A4C):   85.9 ml 55.09 ml/m LA Biplane Vol: 90.2 ml 57.85 ml/m  AORTIC VALVE AV Area (Vmax):    1.43 cm AV Area (Vmean):   1.63 cm AV Area (VTI):     1.73 cm AV Vmax:           219.00 cm/s AV Vmean:          139.000 cm/s AV VTI:            0.386 m AV Peak Grad:      19.2 mmHg AV Mean Grad:      9.0 mmHg LVOT Vmax:         99.90 cm/s LVOT Vmean:        72.100 cm/s LVOT VTI:  0.212 m LVOT/AV VTI ratio: 0.55 AI PHT:            989 msec  AORTA Ao Root diam: 3.50 cm Ao Asc diam:  4.45 cm MITRAL VALVE                TRICUSPID VALVE MV Area (PHT): 2.93 cm     TR Peak grad:   30.7 mmHg MV Decel Time: 259 msec     TR Vmax:        277.00 cm/s MV E velocity: 103.00 cm/s MV A velocity: 25.30 cm/s   SHUNTS MV E/A ratio:  4.07         Systemic VTI:  0.21 m                             Systemic Diam: 2.00 cm Buford Dresser MD Electronically signed by Buford Dresser MD Signature Date/Time: 03/24/2020/3:53:08 PM    Final    DG Hip Unilat With Pelvis 2-3 Views Left  Result Date: 03/23/2020 CLINICAL DATA:  Left hip pain following a fall this morning. EXAM: DG HIP (WITH OR WITHOUT PELVIS) 2-3V LEFT COMPARISON:  11/04/2017.  FINDINGS: Acute, comminuted left intertrochanteric fracture with varus angulation. Interval healing of the previously demonstrated left superior and inferior pubic ramus fractures. Atheromatous arterial calcifications. Diffuse osteopenia. IMPRESSION: Acute, comminuted left intertrochanteric fracture with varus angulation. Electronically Signed   By: Claudie Revering M.D.   On: 03/23/2020 12:27   DG FEMUR MIN 2 VIEWS LEFT  Result Date: 03/23/2020 CLINICAL DATA:  Left IM nail placement. EXAM: DG C-ARM 1-60 MIN; LEFT FEMUR 2 VIEWS FLUOROSCOPY TIME:  Fluoroscopy Time:  46 seconds COMPARISON:  March 23, 2020 FINDINGS: Intraoperative fluoroscopic images from IM nail and screw fixation of left sub trochanteric femoral fracture fixation demonstrate placement of orthopedic hardware with normal alignment and near anatomic osseous alignment. IMPRESSION: Intraoperative fluoroscopic images from IM nail and screw fixation of left sub trochanteric femoral fracture fixation. Electronically Signed   By: Fidela Salisbury M.D.   On: 03/23/2020 19:05   US Abdomen Limited RUQ  Result Date: 04/14/2020 CLINICAL DATA:  86 year old female with hyperbilirubinemia. EXAM: ULTRASOUND ABDOMEN LIMITED RIGHT UPPER QUADRANT COMPARISON:  None. FINDINGS: Gallbladder: Cholecystectomy. Common bile duct: Diameter: 6 mm Liver: No focal lesion identified. Within normal limits in parenchymal echogenicity. Portal vein is patent on color Doppler imaging with normal direction of blood flow towards the liver. Other: None. IMPRESSION: Cholecystectomy, otherwise unremarkable right upper quadrant ultrasound. Electronically Signed   By: Anner Crete M.D.   On: 04/14/2020 22:39      The results of significant diagnostics from this hospitalization (including imaging, microbiology, ancillary and laboratory) are listed below for reference.     Microbiology: Recent Results (from the past 240 hour(s))  SARS Coronavirus 2 by RT PCR (hospital order,  performed in [facility] hospital lab) Nasopharyngeal Nasopharyngeal Swab     Status: None   Collection Time: 04/14/20 10:06 PM   Specimen: Nasopharyngeal Swab  Result Value Ref Range Status   SARS Coronavirus 2 NEGATIVE NEGATIVE Final    Comment: (NOTE) SARS-CoV-2 target nucleic acids are NOT DETECTED.  The SARS-CoV-2 RNA is generally detectable in upper and lower respiratory specimens during the acute phase of infection. The lowest concentration of SARS-CoV-2 viral copies this assay can detect is 250 copies / mL. A negative result does not preclude SARS-CoV-2 infection and should not be used as the sole basis for  treatment or other patient management decisions.  A negative result may occur with improper specimen collection / handling, submission of specimen other than nasopharyngeal swab, presence of viral mutation(s) within the areas targeted by this assay, and inadequate number of viral copies (<250 copies / mL). A negative result must be combined with clinical observations, patient history, and epidemiological information.  Fact Sheet for Patients:   StrictlyIdeas.no  Fact Sheet for Healthcare Providers: BankingDealers.co.za  This test is not yet approved or  cleared by the [place] FDA and has been authorized for detection and/or diagnosis of SARS-CoV-2 by FDA under an Emergency Use Authorization (EUA).  This EUA will remain in effect (meaning this test can be used) for the duration of the COVID-19 declaration under Section 564(b)(1) of the Act, 21 U.S.C. section 360bbb-3(b)(1), unless the authorization is terminated or revoked sooner.  Performed at [facility], [place] [address], [place], [place] [address]      Labs: BNP (last 3 results) No results for input(s): BNP in the last 8760 hours. Basic Metabolic Panel: Recent Labs  Lab 04/15/20 0536 04/15/20 1217 04/16/20 0044 04/16/20 0725  04/16/20 1157 04/17/20 0036 04/17/20 0604  NA 163*   < > 156* 154* 149* 142 139  K 3.2*   < > 2.9* 2.8* 2.9* 3.4* 4.9  CL 127*   < > 122* 117* 112* 111 108  CO2 25   < > 26 28 27 24 26   GLUCOSE 84   < > 128* 133* 148* 119* 119*  BUN 25*   < > 21 19 18 16 14   CREATININE 0.43*   < > 0.43* 0.40* 0.45 0.47 0.57  CALCIUM 8.8*   < > 8.5* 8.8* 8.8* 8.5* 8.6*  MG 1.7  --   --  1.7  --  1.6*  --    < > = values in this interval not displayed.   Liver Function Tests: Recent Labs  Lab 04/14/20 1956  AST 20  ALT 12  ALKPHOS 176*  BILITOT 2.4*  PROT 6.2*  ALBUMIN 3.2*   No results for input(s): LIPASE, AMYLASE in the last 168 hours. No results for input(s): AMMONIA in the last 168 hours. CBC: Recent Labs  Lab 04/14/20 1956 04/15/20 0536 04/16/20 0725 04/17/20 0036  WBC 9.4 7.9 7.4 8.6  NEUTROABS 6.8  --   --   --   HGB 13.4 12.4 12.7 12.3  HCT 45.2 42.0 42.3 40.1  MCV 103.7* 102.7* 100.7* 100.0  PLT 164 142* 144* 141*   Cardiac Enzymes: No results for input(s): CKTOTAL, CKMB, CKMBINDEX, TROPONINI in the last 168 hours. BNP: Invalid input(s): POCBNP CBG: No results for input(s): GLUCAP in the last 168 hours. D-Dimer No results for input(s): DDIMER in the last 72 hours. Hgb A1c No results for input(s): HGBA1C in the last 72 hours. Lipid Profile No results for input(s): CHOL, HDL, LDLCALC, TRIG, CHOLHDL, LDLDIRECT in the last 72 hours. Thyroid function studies Recent Labs    04/15/20 1217  TSH 1.816   Anemia work up Recent Labs    04/16/20 0725  VITAMINB12 457   Urinalysis    Component Value Date/Time   COLORURINE YELLOW 06/22/2018 1529   APPEARANCEUR SL CLOUDY (A) 06/22/2018 1529   LABSPEC 1.020 06/22/2018 1529   PHURINE 6.0 06/22/2018 1529   GLUCOSEU NEGATIVE 06/22/2018 1529   HGBUR SMALL (A) 06/22/2018 1529   BILIRUBINUR SMALL (A) 06/22/2018 1529   BILIRUBINUR negative 07/12/2014 [place] (A) 06/22/2018  1529   PROTEINUR 100 (A) 11/05/2017  0235   UROBILINOGEN 0.2 06/22/2018 1529   NITRITE NEGATIVE 06/22/2018 1529   LEUKOCYTESUR MODERATE (A) 06/22/2018 1529   Sepsis Labs Invalid input(s): PROCALCITONIN,  WBC,  LACTICIDVEN Microbiology Recent Results (from the past 240 hour(s))  SARS Coronavirus 2 by RT PCR (hospital order, performed in [facility] hospital lab) Nasopharyngeal Nasopharyngeal Swab     Status: None   Collection Time: 04/14/20 10:06 PM   Specimen: Nasopharyngeal Swab  Result Value Ref Range Status   SARS Coronavirus 2 NEGATIVE NEGATIVE Final    Comment: (NOTE) SARS-CoV-2 target nucleic acids are NOT DETECTED.  The SARS-CoV-2 RNA is generally detectable in upper and lower respiratory specimens during the acute phase of infection. The lowest concentration of SARS-CoV-2 viral copies this assay can detect is 250 copies / mL. A negative result does not preclude SARS-CoV-2 infection and should not be used as the sole basis for treatment or other patient management decisions.  A negative result may occur with improper specimen collection / handling, submission of specimen other than nasopharyngeal swab, presence of viral mutation(s) within the areas targeted by this assay, and inadequate number of viral copies (<250 copies / mL). A negative result must be combined with clinical observations, patient history, and epidemiological information.  Fact Sheet for Patients:   StrictlyIdeas.no  Fact Sheet for Healthcare Providers: BankingDealers.co.za  This test is not yet approved or  cleared by the [place] FDA and has been authorized for detection and/or diagnosis of SARS-CoV-2 by FDA under an Emergency Use Authorization (EUA).  This EUA will remain in effect (meaning this test can be used) for the duration of the COVID-19 declaration under Section 564(b)(1) of the Act, 21 U.S.C. section 360bbb-3(b)(1), unless the authorization is terminated or revoked  sooner.  Performed at [facility], [place] [address], [place], [place] [address]      Time coordinating discharge:  I have spent 35 minutes face to face with the patient and on the ward discussing the patients care, assessment, plan and disposition with other care givers. >50% of the time was devoted counseling the patient about the risks and benefits of treatment/Discharge disposition and coordinating care.   SIGNED:   Damita Lack, MD  [facility] 04/17/2020, 10:39 AM   If 7PM-7AM, please contact night-coverage

## 2020-04-17 NOTE — NC FL2 (Signed)
[place] LEVEL OF CARE SCREENING TOOL     IDENTIFICATION  Patient Name: Chelsea Jordan Birthdate: 10-02-1926 Sex: female Admission Date (Current Location): 04/14/2020  [facility] [place] Number:  Herbalist and Address:  [facility],  [place] [address], [place]      Provider Number: [id number]  Attending Physician Name and Address:  Damita Lack, MD  Relative Name and Phone Number:       Current Level of Care: Hospital Recommended Level of Care: [facility] Prior Approval Number:    Date Approved/Denied:   PASRR Number:    Discharge Plan: SNF    Current Diagnoses: Patient Active Problem List   Diagnosis Date Noted  . Elevated bilirubin 04/14/2020  . Hypernatremia 04/14/2020  . Neutrophilic leukocytosis 44/09/270  . Hypokalemia 04/14/2020  . Hyperbilirubinemia   . Blood loss anemia 03/31/2020  . Slow transit constipation 03/31/2020  . Fall 03/25/2020  . Hip fracture, left, closed, initial encounter ([place]) 03/23/2020  . Hip fracture ([place]) 03/23/2020  . Insomnia 01/21/2020  . Weight loss 05/30/2019  . Dizziness 02/28/2019  . Dementia ([place]) 06/22/2018  . Olecranon fracture, left, closed, initial encounter 11/05/2017  . Preventative health care 07/15/2016  . Lightheadedness 12/25/2014  . Aortic stenosis 12/20/2014  . 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 Medicare annual wellness visit, subsequent 07/12/2014  . Thoracic aortic aneurysm ([place]) 07/08/2014  . Recurrent UTI 03/12/2014  . Eustachian tube dysfunction 08/16/2013  . Macular degeneration   . Mitral valve disorder 11/17/2009  . Hyperlipidemia 09/24/2009  . Essential hypertension 09/24/2009  . Osteoporosis 07/15/2009  . MENINGIOMA 10/20/2007  . 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 Asthma 10/20/2007  . NEPHROLITHIASIS 10/20/2007    Orientation RESPIRATION BLADDER Height &  Weight     Self  Normal Incontinent Weight: 57.6 kg Height:  5' (152.4 cm)  BEHAVIORAL SYMPTOMS/MOOD NEUROLOGICAL BOWEL NUTRITION STATUS   (none)  (none) Incontinent Diet (see d/c summary)  AMBULATORY STATUS COMMUNICATION OF NEEDS Skin   Total Care Verbally Normal                       Personal Care Assistance Level of Assistance  Bathing, Feeding, Dressing Bathing Assistance: Maximum assistance Feeding assistance: Limited assistance Dressing Assistance: Maximum assistance     Functional Limitations Info    Sight Info: Adequate Hearing Info: Adequate Speech Info: Adequate    SPECIAL CARE FACTORS FREQUENCY                       Contractures Contractures Info: Not present    Additional Factors Info  Code Status Code Status Info: DNR Allergies Info: pentohal,codeine,levofloxacin,Oxycodone-acetaminophen,Sulfonamide Derivatives,tape           Current Medications (04/17/2020):  This is the current hospital active medication list Current Facility-Administered Medications  Medication Dose Route Frequency Provider Last Rate Last Admin  . acetaminophen (TYLENOL) tablet 650 mg  650 mg Oral Q6H PRN Opyd, Ilene Qua, MD       Or  . acetaminophen (TYLENOL) suppository 650 mg  650 mg Rectal Q6H PRN Opyd, Ilene Qua, MD      . amLODipine (NORVASC) tablet 2.5 mg  2.5 mg Oral Daily Opyd, Ilene Qua, MD   2.5 mg at 04/15/20 0944  . aspirin EC tablet 81 mg  81 mg Oral Daily Opyd, Ilene Qua, MD   81 mg at  04/15/20 0944  . dextrose 5 % solution   Intravenous Continuous Damita Lack, MD 75 mL/hr at 04/17/20 0133 New Bag at 04/17/20 0133  . docusate sodium (COLACE) capsule 100 mg  100 mg Oral BID Vianne Bulls, MD   100 mg at 04/15/20 2151  . enoxaparin (LOVENOX) injection 40 mg  40 mg Subcutaneous QHS Opyd, Ilene Qua, MD   40 mg at 04/16/20 2124  . feeding supplement (ENSURE ENLIVE) (ENSURE ENLIVE) liquid 237 mL  237 mL Oral BID BM Amin, Ankit Chirag, MD   237 mL at  04/16/20 1420  . melatonin tablet 5 mg  5 mg Oral QHS Opyd, Ilene Qua, MD   5 mg at 04/15/20 2151  . memantine (NAMENDA) tablet 10 mg  10 mg Oral BID Opyd, Ilene Qua, MD   10 mg at 04/15/20 2151  . mirtazapine (REMERON) tablet 7.5 mg  7.5 mg Oral QHS Opyd, Ilene Qua, MD   7.5 mg at 04/15/20 2151  . multivitamin with minerals tablet 1 tablet  1 tablet Oral Daily Amin, Ankit Chirag, MD      . ondansetron (ZOFRAN) tablet 4 mg  4 mg Oral Q6H PRN Opyd, Ilene Qua, MD       Or  . ondansetron (ZOFRAN) injection 4 mg  4 mg Intravenous Q6H PRN Opyd, Ilene Qua, MD      . oxyCODONE (Oxy IR/ROXICODONE) immediate release tablet 5 mg  5 mg Oral Q6H PRN Opyd, Ilene Qua, MD      . pantoprazole (PROTONIX) EC tablet 40 mg  40 mg Oral Daily Opyd, Ilene Qua, MD   40 mg at 04/15/20 0943  . potassium chloride (KLOR-CON) packet 40 mEq  40 mEq Oral Daily Amin, Ankit Chirag, MD   40 mEq at 04/16/20 0855  . senna (SENOKOT) tablet 17.2 mg  2 tablet Oral BID Opyd, Ilene Qua, MD   17.2 mg at 04/15/20 2151  . simvastatin (ZOCOR) tablet 5 mg  5 mg Oral Daily Opyd, Ilene Qua, MD   5 mg at 04/15/20 2641     Discharge Medications: Please see discharge summary for a list of discharge medications.  Relevant Imaging Results:  Relevant Lab Results:   Additional Information SSN [id number]  [place], [place]

## 2020-04-17 NOTE — TOC Transition Note (Signed)
Transition of Care [facility]) - CM/SW Discharge Note   Patient Details  Name: Chelsea Jordan MRN: [id number] Date of Birth: 1927-06-03  Transition of Care [facility] CM/SW Contact:  Trish Mage, LCSW Phone Number: 04/17/2020, 11:03 AM   Clinical Narrative:   Patient to return to [facility] SN[facility] today.  Family informed. PTAR arranged. Hospice informed.  Nursing, please call report to [phone], ask for SNF, bed 2.  TOC sign off.    Final next level of care: Skilled Nursing Facility Barriers to Discharge: No Barriers Identified   Patient Goals and CMS Choice        Discharge Placement                       Discharge Plan and Services                                     Social Determinants of Health (SDOH) Interventions     Readmission Risk Interventions No flowsheet data found.

## 2020-04-17 NOTE — Progress Notes (Signed)
Pt being discharged to SNF today. RN phoned report to Landis Gandy, Therapist, sports at [facility] [phone]). Per SW, PTAR has already been arranged. No further questions at this time.

## 2020-04-18 ENCOUNTER — Encounter: Payer: Self-pay | Admitting: Nurse Practitioner

## 2020-04-18 ENCOUNTER — Non-Acute Institutional Stay (SKILLED_NURSING_FACILITY): Payer: Medicare Other | Admitting: Nurse Practitioner

## 2020-04-18 DIAGNOSIS — R627 Adult failure to thrive: Secondary | ICD-10-CM

## 2020-04-18 DIAGNOSIS — R17 Unspecified jaundice: Secondary | ICD-10-CM

## 2020-04-18 DIAGNOSIS — K5901 Slow transit constipation: Secondary | ICD-10-CM | POA: Diagnosis not present

## 2020-04-18 DIAGNOSIS — D500 Iron deficiency anemia secondary to blood loss (chronic): Secondary | ICD-10-CM

## 2020-04-18 DIAGNOSIS — F0391 Unspecified dementia with behavioral disturbance: Secondary | ICD-10-CM

## 2020-04-18 DIAGNOSIS — C49A0 Gastrointestinal stromal tumor, unspecified site: Secondary | ICD-10-CM

## 2020-04-18 NOTE — Progress Notes (Signed)
Location:    [facility] Room Number: 2 Place of Service:  SNF (31) Provider:  Marda Stalker, Lennie Odor NP  Virgie Dad, MD  Patient Care Team: Virgie Dad, MD as PCP - General (Internal Medicine) Minus Breeding, MD as Referring Physician (Cardiology) Tanda Rockers, MD as Referring Physician (Pulmonary Disease) Gatha Mayer, MD as Referring Physician (Gastroenterology) Gaynelle Arabian, MD as Referring Physician (Orthopedic Surgery) Melida Quitter, MD as Referring Physician Jackolyn Confer, MD (General Surgery)  Extended Emergency Contact Information Primary Emergency Contact: Willa Rough [place] Phone: [phone] Mobile Phone: [phone] Relation: Relative Secondary Emergency Contact: Charrier,Jack Address: [place], [place] [address] [name] of [place] Phone: [phone] Mobile Phone: [phone] Relation: Spouse  Code Status:  DNR Goals of care: Advanced Directive information Advanced Directives 04/14/2020  Does Patient Have a Medical Advance Directive? Yes  Type of Advance Directive Out of facility DNR (pink MOST or yellow form)  Does patient want to make changes to medical advance directive? No - Patient declined  Copy of [place] in Chart? Yes - validated most recent copy scanned in chart (See row information)  Would patient like information on creating a medical advance directive? No - Patient declined  Pre-existing out of facility DNR order (yellow form or pink MOST form) Yellow form placed in chart (order not valid for inpatient use)     Chief Complaint  Patient presents with  . Acute Visit    Medication review    HPI:  Pt is a 86 y.o. female seen today for an acute visit for f/u hospital stay 04/14/20-04/17/20 for hyponatremia, hypokalemia resolved after IVF, the patient's po intake is persistently poor. The patient appears dry, only open eyes and held my hand upon  my examination. Her goal of care is comfort measures, under Hospice service.   Advanced dementia, sleeps most of time, no behavioral issues, takes Memantine, Mirtazapine  GERD, takes Esomeprazole  Anemia, Hgb 12.3 04/17/20, takes Iron  Constipation, takes Senns , Colace, Bisacodyl  Hyperbilirubinemia, total bilirubin 2.4, [place] abd unremarkable.    Past Medical History:  Diagnosis Date  . Anxiety   . Benign fundic gland polyps of stomach   . Benign gastrointestinal stromal tumor (GIST)   . Bronchitis   . Cancer ([place])    recent skin cancer left leg  excised about 8 days ago-remains with dressing intact.   . Chronic cystitis   . COLONIC POLYPS, HX OF   . Complication of anesthesia   . Diverticulosis   . DYSLIPIDEMIA   . Gastritis   . GERD (gastroesophageal reflux disease)   . Hemorrhoids   . Hiatal hernia   . HYPERTENSION   . Macular degeneration    optho q45mo - [facility]  . Meningioma ([facility])   . MITRAL VALVE PROLAPSE   . NEPHROLITHIASIS   . OSTEOARTHRITIS   . OSTEOPOROSIS   . PONV (postoperative nausea and vomiting)   . Thoracic aortic aneurysm ([facility])    4.1 cm 2015   Past Surgical History:  Procedure Laterality Date  . ABDOMINAL HYSTERECTOMY    . APPENDECTOMY    . BREAST SURGERY    . [place]   no PCI, performed in [place]  . Cataract surgery  2000  . CHOLECYSTECTOMY    . COLONOSCOPY    . ESOPHAGOGASTRODUODENOSCOPY    . EUS N/A 05/16/2014   Procedure: UPPER ENDOSCOPIC  ULTRASOUND (EUS) LINEAR;  Surgeon: Milus Banister, MD;  Location: [facility];  Service: Endoscopy;  Laterality: N/A;  . INSERTION OF MESH  07/04/2012   Procedure: INSERTION OF MESH;  Surgeon: Odis Hollingshead, MD;  Location: [facility];  Service: General;  Laterality: N/A;  . INTRAMEDULLARY (IM) NAIL INTERTROCHANTERIC Left 03/23/2020   Procedure: INTRAMEDULLARY (IM) NAIL INTERTROCHANTRIC HIP;  Surgeon: Wylene Simmer, MD;  Location: [facility];  Service: Orthopedics;  Laterality: Left;  . KNEE  SURGERY    . LEG SKIN LESION  BIOPSY / EXCISION Left    8 days ago pending pathology, remains with dressing.  . SURGERY OF LIP  in 2002  . TONSILLECTOMY    . VENTRAL HERNIA REPAIR  07/04/2012   Procedure: HERNIA REPAIR VENTRAL ADULT;  Surgeon: Odis Hollingshead, MD;  Location: [facility];  Service: General;  Laterality: N/A;    Allergies  Allergen Reactions  . Pentothal [Thiopental] Nausea And Vomiting  . Codeine   . Levofloxacin   . Oxycodone-Acetaminophen   . Sulfonamide Derivatives     REACTION: GI upset/nausea/vomiting  . Tape Rash    Per patient adhesive tape    Allergies as of 04/18/2020      Reactions   Pentothal [thiopental] Nausea And Vomiting   Codeine    Levofloxacin    Oxycodone-acetaminophen    Sulfonamide Derivatives    REACTION: GI upset/nausea/vomiting   Tape Rash   Per patient adhesive tape      Medication List       Accurate as of April 18, 2020 11:59 PM. If you have any questions, ask your nurse or doctor.        acetaminophen 500 MG tablet Commonly known as: TYLENOL Take 1,000 mg by mouth every 8 (eight) hours as needed for moderate pain.   ascorbic acid 500 MG tablet Commonly known as: VITAMIN C Take 1 tablet (500 mg total) by mouth daily.   bisacodyl 5 MG EC tablet Commonly known as: DULCOLAX Take 10 mg by mouth daily as needed for moderate constipation.   docusate sodium 100 MG capsule Commonly known as: Colace Take 1 capsule (100 mg total) by mouth 2 (two) times daily. While taking narcotic pain medicine.   esomeprazole 40 MG capsule Commonly known as: NEXIUM Take 40 mg by mouth 2 (two) times daily before a meal.   ferrous sulfate 325 (65 FE) MG tablet Take 1 tablet (325 mg total) by mouth daily with breakfast. What changed:   when to take this  additional instructions   melatonin 5 MG Tabs Take 5 mg by mouth at bedtime.   memantine 10 MG tablet Commonly known as: NAMENDA Take 10 mg by mouth 2 (two) times daily.     mirtazapine 7.5 MG tablet Commonly known as: REMERON Take 7.5 mg by mouth at bedtime.   ondansetron 4 MG tablet Commonly known as: ZOFRAN Take 1 tablet (4 mg total) by mouth every 6 (six) hours as needed for nausea.   potassium chloride SA 20 MEQ tablet Commonly known as: KLOR-CON Take 20 mEq by mouth daily.   senna 8.6 MG Tabs tablet Commonly known as: SENOKOT Take 2 tablets (17.2 mg total) by mouth 2 (two) times daily.       Review of Systems  Constitutional: Positive for activity change, appetite change and fatigue. Negative for fever.  HENT: Positive for hearing loss. Negative for congestion and voice change.   Eyes: Negative for visual disturbance.  Respiratory: Negative for cough, shortness of  breath and wheezing.   Cardiovascular: Negative for leg swelling.  Gastrointestinal: Negative for abdominal pain and constipation.  Genitourinary: Negative for difficulty urinating, dysuria and urgency.  Musculoskeletal: Positive for arthralgias. Negative for gait problem.       Left hip  Skin: Negative for color change.       Left hip surgical incisions x2 healed.   Neurological: Negative for speech difficulty, weakness, numbness and headaches.       Dementia  Psychiatric/Behavioral: Negative for agitation, behavioral problems and sleep disturbance. The patient is not nervous/anxious.     Immunization History  Administered Date(s) Administered  . Influenza Split 06/07/2011, 06/13/2012, 06/15/2013  . Influenza Whole 06/06/2009, 05/18/2010  . Influenza, High Dose Seasonal PF 06/15/2013, 06/23/2017, 06/22/2018, 04/30/2019  . Influenza-Unspecified 06/07/2015, 05/28/2016  . Moderna SARS-COVID-2 Vaccination 09/10/2019, 10/08/2019  . Pneumococcal Conjugate-13 07/12/2014  . Pneumococcal Polysaccharide-23 09/07/2007  . Td 09/07/2007  . Tdap 06/21/2016   Pertinent  Health Maintenance Due  Topic Date Due  . INFLUENZA VACCINE  04/06/2020  . DEXA SCAN  Completed  . PNA vac Low  Risk Adult  Completed   Fall Risk  10/02/2019 09/26/2019 06/20/2019 02/28/2019 02/14/2019  Falls in the past year? 0 0 1 1 1   Number falls in past yr: 0 0 0 0 0  Injury with Fall? - - 1 1 1   Comment - - - - -  Risk Factor Category  - - - - -  Risk for fall due to : - - - - -  Follow up - - - - -   Functional Status Survey:    Vitals:   04/18/20 0908  BP: 118/73  Pulse: 67  Resp: 18  Temp: (!) 97.2 F (36.2 C)  SpO2: 97%  Weight: 110 lb 8 oz (50.1 kg)  Height: 5\' 4"  (1.626 m)   Body mass index is 18.97 kg/m. Physical Exam Vitals and nursing note reviewed.  Constitutional:      Comments: Generalized weakness, sunken eyes, sleepy  HENT:     Head: Normocephalic and atraumatic.     Mouth/Throat:     Mouth: Mucous membranes are dry.  Eyes:     Extraocular Movements: Extraocular movements intact.     Conjunctiva/sclera: Conjunctivae normal.     Pupils: Pupils are equal, round, and reactive to light.  Cardiovascular:     Rate and Rhythm: Normal rate and regular rhythm.     Heart sounds: Murmur heard.   Pulmonary:     Breath sounds: Normal breath sounds. No wheezing, rhonchi or rales.  Abdominal:     General: Bowel sounds are normal.     Palpations: Abdomen is soft.     Tenderness: There is no abdominal tenderness.  Musculoskeletal:     Cervical back: Normal range of motion and neck supple.     Right lower leg: No edema.     Left lower leg: No edema.  Skin:    General: Skin is warm and dry.     Findings: Bruising present.     Comments: Left hip surgical incisions x2 healed. Sunken eyes and cheeks. Left leg ecchymoses resolving   Neurological:     General: No focal deficit present.     Mental Status: She is alert. Mental status is at baseline.     Motor: No weakness.     Coordination: Coordination normal.     Gait: Gait abnormal.  Psychiatric:        Mood and Affect: Mood normal.  Comments: sleepy     Labs reviewed: Recent Labs    03/27/20 0401  03/27/20 0401 03/28/20 0740 03/28/20 0740 03/29/20 0711 04/03/20 0000 04/15/20 0536 04/15/20 1217 04/16/20 0725 04/16/20 0725 04/16/20 1157 04/17/20 0036 04/17/20 0604  NA 142   < > 141   < > 141   < > 163*   < > 154*   < > 149* 142 139  K 3.9   < > 3.7   < > 3.7   < > 3.2*   < > 2.8*   < > 2.9* 3.4* 4.9  CL 107   < > 107   < > 107   < > 127*   < > 117*   < > 112* 111 108  CO2 29   < > 24   < > 25   < > 25   < > 28   < > 27 24 26   GLUCOSE 96   < > 106*   < > 107*   < > 84   < > 133*   < > 148* 119* 119*  BUN 15   < > 18   < > 17   < > 25*   < > 19   < > 18 16 14   CREATININE 0.54   < > 0.53   < > 0.56   < > 0.43*   < > 0.40*   < > 0.45 0.47 0.57  CALCIUM 8.3*   < > 8.6*   < > 8.6*   < > 8.8*   < > 8.8*   < > 8.8* 8.5* 8.6*  MG 1.8   < > 2.0   < > 1.9  --  1.7  --  1.7  --   --  1.6*  --   PHOS 3.1  --  3.1  --  3.2  --   --   --   --   --   --   --   --    < > = values in this interval not displayed.   Recent Labs    03/28/20 0740 03/28/20 0740 03/29/20 0711 04/03/20 0000 04/14/20 1956  AST 16   < > 17 15 20   ALT 13   < > 14 9 12   ALKPHOS 59   < > 67 114 176*  BILITOT 1.8*  --  1.8*  --  2.4*  PROT 5.0*  --  5.0*  --  6.2*  ALBUMIN 2.6*   < > 2.6* 3.4* 3.2*   < > = values in this interval not displayed.   Recent Labs    01/03/20 0000 03/29/20 0711 03/29/20 0711 04/03/20 0000 04/14/20 1956 04/14/20 1956 04/15/20 0536 04/16/20 0725 04/17/20 0036  WBC   < > 9.1   < > 9.1 9.4   < > 7.9 7.4 8.6  NEUTROABS  --  6.6  --  7,007 6.8  --   --   --   --   HGB   < > 9.4*   < > 11.4* 13.4   < > 12.4 12.7 12.3  HCT   < > 29.6*   < > 35* 45.2   < > 42.0 42.3  39.6 40.1  MCV  --  95.5   < >  --  103.7*   < > 102.7* 100.7* 100.0  PLT   < > 201   < > 256 164   < > 142* 144* 141*   < > =  values in this interval not displayed.   Lab Results  Component Value Date   TSH 1.816 04/15/2020   No results found for: HGBA1C Lab Results  Component Value Date   CHOL 194 08/27/2019    HDL 87 08/27/2019   LDLCALC 87 08/27/2019   LDLDIRECT 97.7 06/22/2013   TRIG 104 08/27/2019   CHOLHDL 2.2 08/27/2019    Significant Diagnostic Results in last 30 days:  DG Chest 1 View  Result Date: 03/23/2020 CLINICAL DATA:  [place] this morning. EXAM: CHEST  1 VIEW COMPARISON:  04/17/2015 FINDINGS: Interval enlargement of the cardiac silhouette with increased prominence of the pulmonary vasculature and interstitial markings. Diffuse osteopenia is again demonstrated as well as mild moderate thoracolumbar scoliosis and degenerative changes. IMPRESSION: Interval cardiomegaly and mild changes of congestive heart failure. Electronically Signed   By: Claudie Revering M.D.   On: 03/23/2020 12:29   DG C-Arm 1-60 Min  Result Date: 03/23/2020 CLINICAL DATA:  Left IM nail placement. EXAM: DG C-ARM 1-60 MIN; LEFT FEMUR 2 VIEWS FLUOROSCOPY TIME:  Fluoroscopy Time:  46 seconds COMPARISON:  March 23, 2020 FINDINGS: Intraoperative fluoroscopic images from IM nail and screw fixation of left sub trochanteric femoral fracture fixation demonstrate placement of orthopedic hardware with normal alignment and near anatomic osseous alignment. IMPRESSION: Intraoperative fluoroscopic images from IM nail and screw fixation of left sub trochanteric femoral fracture fixation. Electronically Signed   By: Fidela Salisbury M.D.   On: 03/23/2020 19:05   ECHOCARDIOGRAM COMPLETE  Result Date: 03/24/2020    ECHOCARDIOGRAM REPORT   Patient Name:   KHRISTINA JANOTA Date of Exam: 03/24/2020 Medical Rec #:  [id number]     Height:       60.0 in Accession #:    [id number]    Weight:       131.0 lb Date of Birth:  [birth year]      BSA:          1.559 m Patient Age:    24 years      BP:           105/56 mmHg Patient Gender: F             HR:           59 bpm. Exam Location:  Inpatient Procedure: 2D Echo, Color Doppler, Cardiac Doppler and 3D Echo Indications:    R01.1 Murmur  History:        Patient has prior history of Echocardiogram examinations,  most                 recent 03/16/2017. Risk Factors:Hypertension and Dyslipidemia.  Sonographer:    Raquel Sarna Senior RDCS Referring Phys: [place] IMPRESSIONS  1. Left ventricular ejection fraction, by estimation, is 55 to 60%. The left ventricle has normal function. The left ventricle has no regional wall motion abnormalities. Left ventricular diastolic function could not be evaluated.  2. Right ventricular systolic function is normal. The right ventricular size is normal. There is mildly elevated pulmonary artery systolic pressure.  3. Tracings suggest atrial flutter with variable conduction, recommend clinical correlation.. Left atrial size was severely dilated.  4. Right atrial size was moderately dilated.  5. The mitral valve is normal in structure. Mild mitral valve regurgitation. No evidence of mitral stenosis.  6. Tricuspid valve regurgitation is mild to moderate.  7. The aortic valve is tricuspid. Aortic valve regurgitation is mild. Mild aortic valve sclerosis is present, with no evidence of aortic valve stenosis.  8.  Aortic dilatation noted. There is moderate dilatation of the ascending aorta measuring 45 mm.  9. The inferior vena cava is dilated in size with >50% respiratory variability, suggesting right atrial pressure of 8 mmHg. FINDINGS  Left Ventricle: Left ventricular ejection fraction, by estimation, is 55 to 60%. The left ventricle has normal function. The left ventricle has no regional wall motion abnormalities. The left ventricular internal cavity size was normal in size. There is  no left ventricular hypertrophy. Left ventricular diastolic function could not be evaluated due to atrial fibrillation. Left ventricular diastolic function could not be evaluated. Right Ventricle: The right ventricular size is normal. No increase in right ventricular wall thickness. Right ventricular systolic function is normal. There is mildly elevated pulmonary artery systolic pressure. The tricuspid regurgitant  velocity is 2.77  m/s, and with an assumed right atrial pressure of 8 mmHg, the estimated right ventricular systolic pressure is 78.2 mmHg. Left Atrium: Tracings suggest atrial flutter with variable conduction, recommend clinical correlation. Left atrial size was severely dilated. Right Atrium: Right atrial size was moderately dilated. Pericardium: There is no evidence of pericardial effusion. Mitral Valve: The mitral valve is normal in structure. There is mild thickening of the mitral valve leaflet(s). There is mild calcification of the mitral valve leaflet(s). Mild mitral annular calcification. Mild mitral valve regurgitation. No evidence of  mitral valve stenosis. Tricuspid Valve: The tricuspid valve is normal in structure. Tricuspid valve regurgitation is mild to moderate. No evidence of tricuspid stenosis. Aortic Valve: The aortic valve is tricuspid. Aortic valve regurgitation is mild. Aortic regurgitation PHT measures 989 msec. Mild aortic valve sclerosis is present, with no evidence of aortic valve stenosis. There is mild calcification of the aortic valve. Aortic valve mean gradient measures 9.0 mmHg. Aortic valve peak gradient measures 19.2 mmHg. Aortic valve area, by VTI measures 1.73 cm. Pulmonic Valve: The pulmonic valve was not well visualized. Pulmonic valve regurgitation is not visualized. Aorta: Aortic dilatation noted. There is moderate dilatation of the ascending aorta measuring 45 mm. Venous: The inferior vena cava is dilated in size with greater than 50% respiratory variability, suggesting right atrial pressure of 8 mmHg. IAS/Shunts: The atrial septum is grossly normal.  LEFT VENTRICLE PLAX 2D LVIDd:         3.50 cm  Diastology LVIDs:         2.70 cm  LV e' lateral:   6.64 cm/s LV PW:         0.90 cm  LV E/e' lateral: 15.5 LV IVS:        1.10 cm  LV e' medial:    6.85 cm/s LVOT diam:     2.00 cm  LV E/e' medial:  15.0 LV SV:         67 LV SV Index:   43 LVOT Area:     3.14 cm  RIGHT VENTRICLE RV  S prime:     14.40 cm/s TAPSE (M-mode): 1.8 cm LEFT ATRIUM             Index       RIGHT ATRIUM           Index LA diam:        3.70 cm 2.37 cm/m  RA Area:     23.90 cm LA Vol (A2C):   92.8 ml 59.52 ml/m RA Volume:   70.00 ml  44.90 ml/m LA Vol (A4C):   85.9 ml 55.09 ml/m LA Biplane Vol: 90.2 ml 57.85 ml/m  AORTIC VALVE AV Area (  Vmax):    1.43 cm AV Area (Vmean):   1.63 cm AV Area (VTI):     1.73 cm AV Vmax:           219.00 cm/s AV Vmean:          139.000 cm/s AV VTI:            0.386 m AV Peak Grad:      19.2 mmHg AV Mean Grad:      9.0 mmHg LVOT Vmax:         99.90 cm/s LVOT Vmean:        72.100 cm/s LVOT VTI:          0.212 m LVOT/AV VTI ratio: 0.55 AI PHT:            989 msec  AORTA Ao Root diam: 3.50 cm Ao Asc diam:  4.45 cm MITRAL VALVE                TRICUSPID VALVE MV Area (PHT): 2.93 cm     TR Peak grad:   30.7 mmHg MV Decel Time: 259 msec     TR Vmax:        277.00 cm/s MV E velocity: 103.00 cm/s MV A velocity: 25.30 cm/s   SHUNTS MV E/A ratio:  4.07         Systemic VTI:  0.21 m                             Systemic Diam: 2.00 cm Buford Dresser MD Electronically signed by Buford Dresser MD Signature Date/Time: 03/24/2020/3:53:08 PM    Final    DG Hip Unilat With Pelvis 2-3 Views Left  Result Date: 03/23/2020 CLINICAL DATA:  Left hip pain following a fall this morning. EXAM: DG HIP (WITH OR WITHOUT PELVIS) 2-3V LEFT COMPARISON:  11/04/2017. FINDINGS: Acute, comminuted left intertrochanteric fracture with varus angulation. Interval healing of the previously demonstrated left superior and inferior pubic ramus fractures. Atheromatous arterial calcifications. Diffuse osteopenia. IMPRESSION: Acute, comminuted left intertrochanteric fracture with varus angulation. Electronically Signed   By: Claudie Revering M.D.   On: 03/23/2020 12:27   DG FEMUR MIN 2 VIEWS LEFT  Result Date: 03/23/2020 CLINICAL DATA:  Left IM nail placement. EXAM: DG C-ARM 1-60 MIN; LEFT FEMUR 2 VIEWS FLUOROSCOPY  TIME:  Fluoroscopy Time:  46 seconds COMPARISON:  March 23, 2020 FINDINGS: Intraoperative fluoroscopic images from IM nail and screw fixation of left sub trochanteric femoral fracture fixation demonstrate placement of orthopedic hardware with normal alignment and near anatomic osseous alignment. IMPRESSION: Intraoperative fluoroscopic images from IM nail and screw fixation of left sub trochanteric femoral fracture fixation. Electronically Signed   By: Fidela Salisbury M.D.   On: 03/23/2020 19:05   US Abdomen Limited RUQ  Result Date: 04/14/2020 CLINICAL DATA:  86 year old female with hyperbilirubinemia. EXAM: ULTRASOUND ABDOMEN LIMITED RIGHT UPPER QUADRANT COMPARISON:  None. FINDINGS: Gallbladder: Cholecystectomy. Common bile duct: Diameter: 6 mm Liver: No focal lesion identified. Within normal limits in parenchymal echogenicity. Portal vein is patent on color Doppler imaging with normal direction of blood flow towards the liver. Other: None. IMPRESSION: Cholecystectomy, otherwise unremarkable right upper quadrant ultrasound. Electronically Signed   By: Anner Crete M.D.   On: 04/14/2020 22:39    Assessment/Plan Adult failure to thrive f/u hospital stay 04/14/20-04/17/20 for hyponatremia, hypokalemia resolved after IVF, the patient's po intake is persistently poor. The patient appears dry, only open eyes and held  my hand upon my examination. Her goal of care is comfort measures, under Hospice service.  Will dc Melatonin, Memantin, Kcl, Fe, Vit C, no further labs.   Elevated bilirubin Bilirubin 2.4 in hospital, [place] abd unremarkable, no noted abd pain, nausea, vomiting, or fever.   Dementia ([place]) Advanced, dc Memantine R>B  Slow transit constipation Continue Senna, Colace, Bisacodyl.   Blood loss anemia Hgb 12.3 04/17/20, dc Fe  Gastrointestinal stromal tumor (GIST) - esophagus Stable, continue Esomeprazole.      Family/ staff Communication: plan of care reviewed with the patient and  charge nurse.   Labs/tests ordered:  No further labs if HPOA consents.   Time spend 35 minutes.

## 2020-04-18 NOTE — Assessment & Plan Note (Addendum)
Continue Senna, Colace, Bisacodyl.

## 2020-04-18 NOTE — Assessment & Plan Note (Signed)
Bilirubin 2.4 in hospital, [place] abd unremarkable, no noted abd pain, nausea, vomiting, or fever.

## 2020-04-18 NOTE — Assessment & Plan Note (Signed)
Hgb 12.3 04/17/20, dc Fe

## 2020-04-18 NOTE — Assessment & Plan Note (Signed)
Stable, continue Esomeprazole.

## 2020-04-18 NOTE — Assessment & Plan Note (Signed)
f/u hospital stay 04/14/20-04/17/20 for hyponatremia, hypokalemia resolved after IVF, the patient's po intake is persistently poor. The patient appears dry, only open eyes and held my hand upon my examination. Her goal of care is comfort measures, under Hospice service.  Will dc Melatonin, Memantin, Kcl, Fe, Vit C, no further labs.

## 2020-04-18 NOTE — Assessment & Plan Note (Signed)
Advanced, dc Memantine R>B

## 2020-04-19 ENCOUNTER — Encounter: Payer: Self-pay | Admitting: Nurse Practitioner

## 2020-04-23 ENCOUNTER — Non-Acute Institutional Stay (SKILLED_NURSING_FACILITY): Payer: Medicare Other | Admitting: Internal Medicine

## 2020-04-23 ENCOUNTER — Encounter: Payer: Self-pay | Admitting: Internal Medicine

## 2020-04-23 DIAGNOSIS — E870 Hyperosmolality and hypernatremia: Secondary | ICD-10-CM

## 2020-04-23 DIAGNOSIS — D500 Iron deficiency anemia secondary to blood loss (chronic): Secondary | ICD-10-CM

## 2020-04-23 DIAGNOSIS — S72001D Fracture of unspecified part of neck of right femur, subsequent encounter for closed fracture with routine healing: Secondary | ICD-10-CM

## 2020-04-23 DIAGNOSIS — F0391 Unspecified dementia with behavioral disturbance: Secondary | ICD-10-CM | POA: Diagnosis not present

## 2020-04-23 DIAGNOSIS — I10 Essential (primary) hypertension: Secondary | ICD-10-CM

## 2020-04-23 NOTE — Progress Notes (Signed)
Location: [facility] Room Number: 2 Place of Service:  SNF [phone])  Provider: Veleta Miners MD  Code Status: DNR Goals of Care:  Advanced Directives 04/14/2020  Does Patient Have a Medical Advance Directive? Yes  Type of Advance Directive Out of facility DNR (pink MOST or yellow form)  Does patient want to make changes to medical advance directive? No - Patient declined  Copy of [place] in Chart? Yes - validated most recent copy scanned in chart (See row information)  Would patient like information on creating a medical advance directive? No - Patient declined  Pre-existing out of facility DNR order (yellow form or pink MOST form) Yellow form placed in chart (order not valid for inpatient use)     Chief Complaint  Patient presents with  . Readmit To SNF    Readmission    HPI: Patient is a 86 y.o. female seen today for an acute visit for Readmission to SNF  Patient was admitted in the hospital from 8/9-8/12 for hypernatremia  Patient has h/o history of recurrent UTI, thoracic aneurysm 4.3 cm, history of gist tumor, moderate dementia with weight loss and unsteady gait. also was Admitted in the [facility] from 7/18-7/24 for Left Intertrochanteric Fracture Underwent Surgery With IM nailing on 7/18  Since he came to the facility patient would not need eat.  With said she does not have any appetite.  Felt weak and would sleep most of the time.  The labs were done and it showed her sodium was elevated 163 She was hydrated.  And her sodium did come down.  But patient continued to not eat and after discussion with the family she was referred to hospice care Patient continues to be very weak.  Would not talk much to me.  States everything is good.  Per nurses notes she is eating.less then 25 %.   Past Medical History:  Diagnosis Date  . Anxiety   . Benign fundic gland polyps of stomach   . Benign gastrointestinal stromal tumor (GIST)   .  Bronchitis   . Cancer ([place])    recent skin cancer left leg  excised about 8 days ago-remains with dressing intact.   . Chronic cystitis   . COLONIC POLYPS, HX OF   . Complication of anesthesia   . Diverticulosis   . DYSLIPIDEMIA   . Gastritis   . GERD (gastroesophageal reflux disease)   . Hemorrhoids   . Hiatal hernia   . HYPERTENSION   . Macular degeneration    optho q44mo - [facility]  . Meningioma ([place])   . MITRAL VALVE PROLAPSE   . NEPHROLITHIASIS   . OSTEOARTHRITIS   . OSTEOPOROSIS   . PONV (postoperative nausea and vomiting)   . Thoracic aortic aneurysm ([facility])    4.1 cm 2015    Past Surgical History:  Procedure Laterality Date  . ABDOMINAL HYSTERECTOMY    . APPENDECTOMY    . BREAST SURGERY    . [place]   no PCI, performed in [place]  . Cataract surgery  2000  . CHOLECYSTECTOMY    . COLONOSCOPY    . ESOPHAGOGASTRODUODENOSCOPY    . EUS N/A 05/16/2014   Procedure: UPPER ENDOSCOPIC ULTRASOUND (EUS) LINEAR;  Surgeon: Milus Banister, MD;  Location: [facility];  Service: Endoscopy;  Laterality: N/A;  . INSERTION OF MESH  07/04/2012   Procedure: INSERTION OF MESH;  Surgeon: Odis Hollingshead, MD;  Location: [facility];  Service: General;  Laterality: N/A;  . INTRAMEDULLARY (IM) NAIL INTERTROCHANTERIC Left 03/23/2020   Procedure: INTRAMEDULLARY (IM) NAIL INTERTROCHANTRIC HIP;  Surgeon: Wylene Simmer, MD;  Location: [facility];  Service: Orthopedics;  Laterality: Left;  . KNEE SURGERY    . LEG SKIN LESION  BIOPSY / EXCISION Left    8 days ago pending pathology, remains with dressing.  . SURGERY OF LIP  in 2002  . TONSILLECTOMY    . VENTRAL HERNIA REPAIR  07/04/2012   Procedure: HERNIA REPAIR VENTRAL ADULT;  Surgeon: Odis Hollingshead, MD;  Location: [facility];  Service: General;  Laterality: N/A;    Allergies  Allergen Reactions  . Pentothal [Thiopental] Nausea And Vomiting  . Codeine   . Levofloxacin   . Oxycodone-Acetaminophen   . Sulfonamide Derivatives      REACTION: GI upset/nausea/vomiting  . Tape Rash    Per patient adhesive tape    Outpatient Encounter Medications as of 04/23/2020  Medication Sig  . acetaminophen (TYLENOL) 500 MG tablet Take 1,000 mg by mouth every 8 (eight) hours as needed for moderate pain.   . bisacodyl (DULCOLAX) 5 MG EC tablet Take 10 mg by mouth daily as needed for moderate constipation.  . docusate sodium (COLACE) 100 MG capsule Take 1 capsule (100 mg total) by mouth 2 (two) times daily. While taking narcotic pain medicine.  . ondansetron (ZOFRAN) 4 MG tablet Take 1 tablet (4 mg total) by mouth every 6 (six) hours as needed for nausea.  Marland Kitchen senna (SENOKOT) 8.6 MG TABS tablet Take 2 tablets (17.2 mg total) by mouth 2 (two) times daily.  . mirtazapine (REMERON) 7.5 MG tablet Take 7.5 mg by mouth at bedtime.  . [DISCONTINUED] ascorbic acid (VITAMIN C) 500 MG tablet Take 1 tablet (500 mg total) by mouth daily.  . [DISCONTINUED] esomeprazole (NEXIUM) 40 MG capsule Take 40 mg by mouth 2 (two) times daily before a meal.  . [DISCONTINUED] ferrous sulfate 325 (65 FE) MG tablet Take 1 tablet (325 mg total) by mouth daily with breakfast. (Patient taking differently: Take 325 mg by mouth. Once A Day on Mon, Wed, Fri)  . [DISCONTINUED] melatonin 5 MG TABS Take 5 mg by mouth at bedtime.   . [DISCONTINUED] memantine (NAMENDA) 10 MG tablet Take 10 mg by mouth 2 (two) times daily.  . [DISCONTINUED] potassium chloride SA (KLOR-CON) 20 MEQ tablet Take 20 mEq by mouth daily.   No facility-administered encounter medications on file as of 04/23/2020.    Review of Systems:  Review of Systems  Unable to perform ROS: Dementia    Health Maintenance  Topic Date Due  . INFLUENZA VACCINE  04/06/2020  . TETANUS/TDAP  06/21/2026  . DEXA SCAN  Completed  . COVID-19 Vaccine  Completed  . PNA vac Low Risk Adult  Completed    Physical Exam: Vitals:   04/23/20 1409  BP: (!) 150/87  Pulse: 64  Resp: 14  Temp: (!) 97.3 F (36.3 C)  SpO2:  95%  Weight: 108 lb (49 kg)  Height: 5\' 4"  (1.626 m)   Body mass index is 18.54 kg/m. Physical Exam Vitals reviewed.  Constitutional:      Comments: But does not respond much  HENT:     Head: Normocephalic.     Nose: Nose normal.     Mouth/Throat:     Mouth: Mucous membranes are dry.  Eyes:     Pupils: Pupils are equal, round, and reactive to light.  Cardiovascular:     Rate and Rhythm: Normal  rate and regular rhythm.     Pulses: Normal pulses.  Pulmonary:     Effort: Pulmonary effort is normal.     Breath sounds: Normal breath sounds.  Abdominal:     General: Abdomen is flat. Bowel sounds are normal.     Palpations: Abdomen is soft.  Musculoskeletal:        General: No swelling.     Cervical back: Neck supple.  Skin:    General: Skin is warm.  Neurological:     General: No focal deficit present.     Mental Status: She is alert.  Psychiatric:     Comments: Depressed     Labs reviewed: Basic Metabolic Panel: Recent Labs    03/23/20 2213 03/24/20 0359 03/27/20 0401 03/27/20 0401 03/28/20 0740 03/28/20 0740 03/29/20 0711 04/03/20 0000 04/15/20 0536 04/15/20 0536 04/15/20 1217 04/15/20 1701 04/16/20 0725 04/16/20 0725 04/16/20 1157 04/17/20 0036 04/17/20 0604  NA  --    < > 142   < > 141   < > 141   < > 163*   < > 162*   < > 154*   < > 149* 142 139  K  --    < > 3.9   < > 3.7   < > 3.7   < > 3.2*   < > 3.4*   < > 2.8*   < > 2.9* 3.4* 4.9  CL  --    < > 107   < > 107   < > 107   < > 127*   < > 125*   < > 117*   < > 112* 111 108  CO2  --    < > 29   < > 24   < > 25   < > 25   < > 26   < > 28   < > 27 24 26   GLUCOSE  --    < > 96   < > 106*   < > 107*   < > 84   < > 107*   < > 133*   < > 148* 119* 119*  BUN  --    < > 15   < > 18   < > 17   < > 25*   < > 25*   < > 19   < > 18 16 14   CREATININE  --    < > 0.54   < > 0.53   < > 0.56   < > 0.43*   < > 0.43*   < > 0.40*   < > 0.45 0.47 0.57  CALCIUM  --    < > 8.3*   < > 8.6*   < > 8.6*   < > 8.8*   < > 8.7*    < > 8.8*   < > 8.8* 8.5* 8.6*  MG 1.8   < > 1.8   < > 2.0   < > 1.9  --  1.7  --   --   --  1.7  --   --  1.6*  --   PHOS 5.0*   < > 3.1  --  3.1  --  3.2  --   --   --   --   --   --   --   --   --   --   TSH 1.998  --   --   --   --   --   --   --   --   --  1.816  --   --   --   --   --   --    < > = values in this interval not displayed.   Liver Function Tests: Recent Labs    03/28/20 0740 03/28/20 0740 03/29/20 0711 04/03/20 0000 04/14/20 1956  AST 16   < > 17 15 20   ALT 13   < > 14 9 12   ALKPHOS 59   < > 67 114 176*  BILITOT 1.8*  --  1.8*  --  2.4*  PROT 5.0*  --  5.0*  --  6.2*  ALBUMIN 2.6*   < > 2.6* 3.4* 3.2*   < > = values in this interval not displayed.   No results for input(s): LIPASE, AMYLASE in the last 8760 hours. No results for input(s): AMMONIA in the last 8760 hours. CBC: Recent Labs    01/03/20 0000 03/29/20 0711 03/29/20 0711 04/03/20 0000 04/14/20 1956 04/14/20 1956 04/15/20 0536 04/16/20 0725 04/17/20 0036  WBC   < > 9.1   < > 9.1 9.4   < > 7.9 7.4 8.6  NEUTROABS  --  6.6  --  7,007 6.8  --   --   --   --   HGB   < > 9.4*   < > 11.4* 13.4   < > 12.4 12.7 12.3  HCT   < > 29.6*   < > 35* 45.2   < > 42.0 42.3  39.6 40.1  MCV  --  95.5   < >  --  103.7*   < > 102.7* 100.7* 100.0  PLT   < > 201   < > 256 164   < > 142* 144* 141*   < > = values in this interval not displayed.   Lipid Panel: Recent Labs    08/27/19 0000  CHOL 194  HDL 87  LDLCALC 87  TRIG 104  CHOLHDL 2.2   No results found for: HGBA1C  Procedures since last visit: US Abdomen Limited RUQ  Result Date: 04/14/2020 CLINICAL DATA:  86 year old female with hyperbilirubinemia. EXAM: ULTRASOUND ABDOMEN LIMITED RIGHT UPPER QUADRANT COMPARISON:  None. FINDINGS: Gallbladder: Cholecystectomy. Common bile duct: Diameter: 6 mm Liver: No focal lesion identified. Within normal limits in parenchymal echogenicity. Portal vein is patent on color Doppler imaging with normal direction of blood  flow towards the liver. Other: None. IMPRESSION: Cholecystectomy, otherwise unremarkable right upper quadrant ultrasound. Electronically Signed   By: Anner Crete M.D.   On: 04/14/2020 22:39    Assessment/Plan  Patient With Hypernatremia, Dementia Depression and Recent Hip fracture Not eating . Loosing weight Has lost 10 lbs Family wants her to be comfortable Enrolled in Hospice D/w Nurses She is refusing Meds also. Will hold Remeron if not take it. Continue Comfort care   Labs/tests ordered:  * No order type specified * Next appt:  Visit date not found

## 2020-06-06 DEATH — deceased
